# Patient Record
Sex: Male | Born: 1950
Health system: Southern US, Community
[De-identification: ages and names within clinical notes are randomized; demographics above are authoritative.]

## PROBLEM LIST (undated history)

## (undated) DIAGNOSIS — K219 Gastro-esophageal reflux disease without esophagitis: Secondary | ICD-10-CM

## (undated) DIAGNOSIS — C801 Malignant (primary) neoplasm, unspecified: Secondary | ICD-10-CM

## (undated) DIAGNOSIS — I639 Cerebral infarction, unspecified: Secondary | ICD-10-CM

## (undated) DIAGNOSIS — E785 Hyperlipidemia, unspecified: Secondary | ICD-10-CM

## (undated) HISTORY — DX: Cerebral infarction, unspecified: I63.9

## (undated) HISTORY — DX: Hyperlipidemia, unspecified: E78.5

## (undated) HISTORY — DX: Gastro-esophageal reflux disease without esophagitis: K21.9

## (undated) HISTORY — DX: Malignant (primary) neoplasm, unspecified: C80.1

## (undated) HISTORY — PX: EYE SURGERY: SHX253

---

## 2002-07-04 ENCOUNTER — Emergency Department (HOSPITAL_COMMUNITY): Admission: EM | Admit: 2002-07-04 | Discharge: 2002-07-04 | Payer: Self-pay | Admitting: Emergency Medicine

## 2014-12-24 HISTORY — PX: CATARACT EXTRACTION: SUR2

## 2015-11-14 ENCOUNTER — Encounter: Payer: Self-pay | Admitting: Physician Assistant

## 2015-11-14 ENCOUNTER — Ambulatory Visit (INDEPENDENT_AMBULATORY_CARE_PROVIDER_SITE_OTHER): Payer: BLUE CROSS/BLUE SHIELD | Admitting: Physician Assistant

## 2015-11-14 VITALS — BP 110/68 | HR 64 | Temp 98.3°F | Resp 18 | Ht 69.5 in | Wt 176.0 lb

## 2015-11-14 DIAGNOSIS — E785 Hyperlipidemia, unspecified: Secondary | ICD-10-CM | POA: Diagnosis not present

## 2015-11-14 DIAGNOSIS — K219 Gastro-esophageal reflux disease without esophagitis: Secondary | ICD-10-CM | POA: Insufficient documentation

## 2015-11-14 LAB — HEPATIC FUNCTION PANEL
ALBUMIN: 3.9 g/dL (ref 3.6–5.1)
ALT: 15 U/L (ref 9–46)
AST: 13 U/L (ref 10–35)
Alkaline Phosphatase: 56 U/L (ref 40–115)
BILIRUBIN DIRECT: 0.1 mg/dL (ref <=0.2)
Indirect Bilirubin: 0.4 mg/dL (ref 0.2–1.2)
TOTAL PROTEIN: 6.5 g/dL (ref 6.1–8.1)
Total Bilirubin: 0.5 mg/dL (ref 0.2–1.2)

## 2015-11-14 LAB — LIPID PANEL
CHOL/HDL RATIO: 3 ratio (ref <=5.0)
CHOLESTEROL: 151 mg/dL (ref 125–200)
HDL: 51 mg/dL (ref >=40)
LDL Cholesterol: 91 mg/dL (ref <130)
TRIGLYCERIDES: 47 mg/dL (ref <150)
VLDL: 9 mg/dL (ref <30)

## 2015-11-14 MED ORDER — OMEPRAZOLE 40 MG PO CPDR: 40.0000 mg | DELAYED_RELEASE_CAPSULE | Freq: Every day | ORAL | Status: DC

## 2015-11-14 MED ORDER — PRAVASTATIN SODIUM 40 MG PO TABS: 40.0000 mg | ORAL_TABLET | Freq: Every day | ORAL | Status: DC

## 2015-11-14 NOTE — Progress Notes (Signed)
Patient ID: Kyle Garcia MRN: [id number], DOB: 1951-10-19, 64 y.o. Date of Encounter: '@DATE'$ @  Chief Complaint:  Chief Complaint  Patient presents with  . new pt get estab    does not need CPE, med refills    HPI: 64 y.o. year old white male  presents as a new patient to establish care.  He was going to the [facility] but it recently closed so he is establishing care here. He had a complete physical exam there in August 2016. Says that he has also had another "physical" last month-- when changing insurance policies.  He is here today just to establish care and have lab work and get refills on medications.  He states that he is okay with continuing pravastatin if it is necessary but he is wanting to get my opinion about whether he really needs that or not. States that he started that medicine 2 - 3 years ago.  We reviewed his risk factor profile. The only risk factors are age and male.  He quit smoking 30 years ago. He has no hypertension. No Obesity.  He has no family history of cardiovascular disease.  He states that he has not been on the omeprazole for very long. However he does take it most every day. I did discuss today that he can decrease this and take it only as needed. He says that he likes to eat a lot of spicy food and eats a lot of [place] and [place]. Discussed that he could just take it on the days that he knows he is going to eat those foods and try to go without the medicine on other days.  He has no other complaints or concerns today.   History reviewed. No pertinent past medical history.   Home Meds: Aspirin 81 mg daily Omeprazole 40 mg daily Pravastatin 40 mg daily Multivitamin daily    Allergies: No Known Allergies  Social History   Social History  . Marital Status: Married    Spouse Name: N/A  . Number of Children: N/A  . Years of Education: N/A   Occupational History  . Not on file.   Social History Main Topics  . Smoking status: Current  Some Day Smoker    Types: Cigars  . Smokeless tobacco: Never Used  . Alcohol Use: 0.0 oz/week    0 Standard drinks or equivalent per week  . Drug Use: Yes  . Sexual Activity: Not on file   Other Topics Concern  . Not on file   Social History Narrative  . No narrative on file    History reviewed. No pertinent family history.   Review of Systems:  See HPI for pertinent ROS. All other ROS negative.    Physical Exam: Blood pressure 110/68, pulse 64, temperature 98.3 F (36.8 C), temperature source Oral, resp. rate 18, height 5' 9.5" (1.765 m), weight 176 lb (79.833 kg)., Body mass index is 25.63 kg/(m^2). General: WNWD WM. Appears in no acute distress. Neck: Supple. No thyromegaly. No lymphadenopathy. No carotid bruit. Lungs: Clear bilaterally to auscultation without wheezes, rales, or rhonchi. Breathing is unlabored. Heart: RRR with S1 S2. No murmurs, rubs, or gallops. Abdomen: Soft, non-tender, non-distended with normoactive bowel sounds. No hepatomegaly. No rebound/guarding. No obvious abdominal masses. Musculoskeletal:  Strength and tone normal for age. Extremities/Skin: Warm and dry.  No edema.  Neuro: Alert and oriented X 3. Moves all extremities spontaneously. Gait is normal. CNII-XII grossly in tact. Psych:  Responds to questions appropriately with a  normal affect.     ASSESSMENT AND PLAN:  64 y.o. year old male with  1. Gastroesophageal reflux disease, esophagitis presence not specified See history of present illness. Discussed trying to decrease the use of this. Otherwise this is working well and symptoms are controlled with this. May be able to even decrease the use of this. - omeprazole (PRILOSEC) 40 MG capsule; Take 1 capsule (40 mg total) by mouth daily.  Dispense: 90 capsule; Refill: 1  2. Hyperlipemia See history of present illness regarding cardiac risk factor profile and whether this medication can possibly be decreased or even stopped. - Lipid panel -  Hepatic function panel - pravastatin (PRAVACHOL) 40 MG tablet; Take 1 tablet (40 mg total) by mouth daily.  Dispense: 90 tablet; Refill: 1  He did receive influenza vaccine 10/12/15. He will return for regular office visit in 6 months or for complete physical exam once it has been the full 12 month period between his last physical.  Signed, [address] [place], [place], [facility] 11/14/2015 9:34 AM

## 2015-11-15 ENCOUNTER — Encounter: Payer: Self-pay | Admitting: Family Medicine

## 2015-11-21 ENCOUNTER — Telehealth: Payer: Self-pay | Admitting: Family Medicine

## 2015-11-21 ENCOUNTER — Encounter: Payer: Self-pay | Admitting: Family Medicine

## 2015-11-21 NOTE — Telephone Encounter (Signed)
Pt aware of lab results and med change.  Had just picked up 90 day supply of Pravastatin 40, will cut in half until used.  Does not need new Rx called in at this time.

## 2015-11-21 NOTE — Telephone Encounter (Signed)
-----   Message from Orlena Sheldon, PA-C sent at 11/15/2015  2:22 PM EST ----- Tell him he can decrease Pravastatin to '20mg'$ . But, should not stop it all together. Change med list and order new dose.

## 2016-01-27 ENCOUNTER — Other Ambulatory Visit: Payer: Self-pay | Admitting: Family Medicine

## 2016-01-27 DIAGNOSIS — K219 Gastro-esophageal reflux disease without esophagitis: Secondary | ICD-10-CM

## 2016-01-27 MED ORDER — PRAVASTATIN SODIUM 20 MG PO TABS: 20.0000 mg | ORAL_TABLET | Freq: Every day | ORAL | Status: DC

## 2016-01-27 MED ORDER — OMEPRAZOLE 40 MG PO CPDR: 40.0000 mg | DELAYED_RELEASE_CAPSULE | Freq: Every day | ORAL | Status: DC

## 2016-01-27 NOTE — Telephone Encounter (Signed)
Medication refilled per protocol. 

## 2016-01-30 ENCOUNTER — Other Ambulatory Visit: Payer: Self-pay | Admitting: Family Medicine

## 2016-01-30 DIAGNOSIS — K219 Gastro-esophageal reflux disease without esophagitis: Secondary | ICD-10-CM

## 2016-01-30 MED ORDER — OMEPRAZOLE 40 MG PO CPDR: 40.0000 mg | DELAYED_RELEASE_CAPSULE | Freq: Every day | ORAL | Status: DC

## 2016-01-30 MED ORDER — PRAVASTATIN SODIUM 20 MG PO TABS: 20.0000 mg | ORAL_TABLET | Freq: Every day | ORAL | Status: DC

## 2016-01-30 NOTE — Telephone Encounter (Signed)
Medication refilled per protocol. 

## 2016-09-08 ENCOUNTER — Other Ambulatory Visit: Payer: Self-pay | Admitting: Physician Assistant

## 2016-09-08 DIAGNOSIS — K219 Gastro-esophageal reflux disease without esophagitis: Secondary | ICD-10-CM

## 2016-11-19 ENCOUNTER — Encounter: Payer: Self-pay | Admitting: Physician Assistant

## 2016-11-19 ENCOUNTER — Ambulatory Visit (INDEPENDENT_AMBULATORY_CARE_PROVIDER_SITE_OTHER): Payer: BLUE CROSS/BLUE SHIELD | Admitting: Physician Assistant

## 2016-11-19 VITALS — BP 120/78 | HR 70 | Temp 97.9°F | Resp 18 | Wt 179.0 lb

## 2016-11-19 DIAGNOSIS — Z0000 Encounter for general adult medical examination without abnormal findings: Secondary | ICD-10-CM

## 2016-11-19 DIAGNOSIS — K219 Gastro-esophageal reflux disease without esophagitis: Secondary | ICD-10-CM

## 2016-11-19 DIAGNOSIS — E785 Hyperlipidemia, unspecified: Secondary | ICD-10-CM

## 2016-11-19 DIAGNOSIS — Z23 Encounter for immunization: Secondary | ICD-10-CM

## 2016-11-19 LAB — CBC WITH DIFFERENTIAL/PLATELET
BASOS ABS: 0 {cells}/uL (ref 0–200)
BASOS PCT: 0 %
EOS PCT: 2 %
Eosinophils Absolute: 122 cells/uL (ref 15–500)
HCT: 46.9 % (ref 38.5–50.0)
Hemoglobin: 15.6 g/dL (ref 13.0–17.0)
Lymphocytes Relative: 23 %
Lymphs Abs: 1403 cells/uL (ref 850–3900)
MCH: 29.9 pg (ref 27.0–33.0)
MCHC: 33.3 g/dL (ref 32.0–36.0)
MCV: 90 fL (ref 80.0–100.0)
MONOS PCT: 8 %
MPV: 10.5 fL (ref 7.5–12.5)
Monocytes Absolute: 488 cells/uL (ref 200–950)
NEUTROS ABS: 4087 {cells}/uL (ref 1500–7800)
Neutrophils Relative %: 67 %
PLATELETS: 184 10*3/uL (ref 140–400)
RBC: 5.21 MIL/uL (ref 4.20–5.80)
RDW: 13.3 % (ref 11.0–15.0)
WBC: 6.1 10*3/uL (ref 3.8–10.8)

## 2016-11-19 LAB — COMPLETE METABOLIC PANEL WITH GFR
ALT: 24 U/L (ref 9–46)
AST: 27 U/L (ref 10–35)
Albumin: 4.1 g/dL (ref 3.6–5.1)
Alkaline Phosphatase: 50 U/L (ref 40–115)
BILIRUBIN TOTAL: 0.7 mg/dL (ref 0.2–1.2)
BUN: 16 mg/dL (ref 7–25)
CO2: 25 mmol/L (ref 20–31)
Calcium: 8.8 mg/dL (ref 8.6–10.3)
Chloride: 103 mmol/L (ref 98–110)
Creat: 0.95 mg/dL (ref 0.70–1.25)
GFR, EST NON AFRICAN AMERICAN: 84 mL/min (ref >=60)
GFR, Est African American: >89 mL/min (ref >=60)
GLUCOSE: 85 mg/dL (ref 70–99)
Potassium: 4.5 mmol/L (ref 3.5–5.3)
SODIUM: 138 mmol/L (ref 135–146)
TOTAL PROTEIN: 6.7 g/dL (ref 6.1–8.1)

## 2016-11-19 LAB — LIPID PANEL
CHOL/HDL RATIO: 3.4 ratio (ref <5.0)
Cholesterol: 178 mg/dL (ref <200)
HDL: 52 mg/dL (ref >40)
LDL CALC: 115 mg/dL — AB (ref <100)
Triglycerides: 55 mg/dL (ref <150)
VLDL: 11 mg/dL (ref <30)

## 2016-11-19 NOTE — Addendum Note (Signed)
Addended by: Vonna Kotyk A on: 11/19/2016 09:30 AM   Modules accepted: Orders

## 2016-11-19 NOTE — Progress Notes (Signed)
Patient ID: Kyle Garcia MRN: [id number], DOB: November 30, 1951 65 y.o. Date of Encounter: 11/19/2016, 8:50 AM    Chief Complaint: Physical (CPE)  HPI: 65 y.o. y/o male here for CPE.    11/14/2015: 65 y.o. year old white male  presents as a new patient to establish care.  He was going to the [facility] but it recently closed so he is establishing care here. He had a complete physical exam there in August 2016. Says that he has also had another "physical" last month-- when changing insurance policies.  He is here today just to establish care and have lab work and get refills on medications.  He states that he is okay with continuing pravastatin if it is necessary but he is wanting to get my opinion about whether he really needs that or not. States that he started that medicine 2 - 3 years ago.  We reviewed his risk factor profile. The only risk factors are age and male.  He quit smoking 30 years ago. He has no hypertension. No Obesity.  He has no family history of cardiovascular disease.  He states that he has not been on the omeprazole for very long. However he does take it most every day. I did discuss today that he can decrease this and take it only as needed. He says that he likes to eat a lot of spicy food and eats a lot of [place] and [place]. Discussed that he could just take it on the days that he knows he is going to eat those foods and try to go without the medicine on other days.  He has no other complaints or concerns today.  AT THAT OV: --Rxed Omeprazole to use PRN GERD --Checked FLP/CMET--- Told him that he could decrease Pravachol down to 20 mg but did not feel that he could should discontinue it completely.  11/19/2016: Today he states that he did decrease the pravastatin to 20 mg as directed. Is taking this daily. No myalgias or other adverse effects. Today he states that he had been taking the omeprazole every day because he thought that was the way he is  supposed to take it. Today discussed that he can use this just as needed. He has no complaints or concerns today. Say he has been feeling well.  Review of Systems: Consitutional: No fever, chills, fatigue, night sweats, lymphadenopathy, or weight changes. Eyes: No visual changes, eye redness, or discharge. ENT/Mouth: Ears: No otalgia, tinnitus, hearing loss, discharge. Nose: No congestion, rhinorrhea, sinus pain, or epistaxis. Throat: No sore throat, post nasal drip, or teeth pain. Cardiovascular: No CP, palpitations, diaphoresis, DOE, edema, orthopnea, PND. Respiratory: No cough, hemoptysis, SOB, or wheezing. Gastrointestinal: No anorexia, dysphagia, reflux, pain, nausea, vomiting, hematemesis, diarrhea, constipation, BRBPR, or melena. Genitourinary: No dysuria, frequency, urgency, hematuria, incontinence, nocturia, decreased urinary stream, discharge, impotence, or testicular pain/masses. Musculoskeletal: No decreased ROM, myalgias, stiffness, joint swelling, or weakness. Skin: No rash, erythema, lesion changes, pain, warmth, jaundice, or pruritis. Neurological: No headache, dizziness, syncope, seizures, tremors, memory loss, coordination problems, or paresthesias. Psychological: No anxiety, depression, hallucinations, SI/HI. Endocrine: No fatigue, polydipsia, polyphagia, polyuria, or known diabetes. All other systems were reviewed and are otherwise negative.  Past Medical History:  Diagnosis Date  . GERD (gastroesophageal reflux disease)   . Hyperlipidemia      No past surgical history on file.  Home Meds:  Outpatient Medications Prior to Visit  Medication Sig Dispense Refill  . aspirin EC 81 MG tablet Take 81  mg by mouth every other day.    . Multiple Vitamins-Minerals (MULTIVITAMIN WITH MINERALS) tablet Take 1 tablet by mouth.    Marland Kitchen omeprazole (PRILOSEC) 40 MG capsule TAKE 1 CAPSULE DAILY 90 capsule 1  . pravastatin (PRAVACHOL) 20 MG tablet Take 1 tablet (20 mg total) by mouth  daily at 6 PM. 90 tablet 1   No facility-administered medications prior to visit.     Allergies: No Known Allergies  Social History   Social History  . Marital status: Married    Spouse name: N/A  . Number of children: N/A  . Years of education: N/A   Occupational History  . Not on file.   Social History Main Topics  . Smoking status: Current Some Day Smoker    Types: Cigars  . Smokeless tobacco: Never Used  . Alcohol use 2.4 - 4.2 oz/week    1 - 3 Glasses of wine, 3 - 4 Cans of beer per week  . Drug use:   . Sexual activity: Not on file   Other Topics Concern  . Not on file   Social History Narrative  . No narrative on file    No family history on file.  Physical Exam: Blood pressure 120/78, pulse 70, temperature 97.9 F (36.6 C), temperature source Oral, resp. rate 18, weight 179 lb (81.2 kg), SpO2 99 %.  General: Well developed, well nourished WM. Appears in no acute distress. HEENT: Normocephalic, atraumatic. Conjunctiva pink, sclera non-icteric. Pupils 2 mm constricting to 1 mm, round, regular, and equally reactive to light and accomodation. EOMI. Internal auditory canal clear. TMs with good cone of light and without pathology. Nasal mucosa pink. Nares are without discharge. No sinus tenderness. Oral mucosa pink. Dentition good. Pharynx without exudate.   Neck: Supple. Trachea midline. No thyromegaly. Full ROM. No lymphadenopathy. Lungs: Clear to auscultation bilaterally without wheezes, rales, or rhonchi. Breathing is of normal effort and unlabored. Cardiovascular: RRR with S1 S2. No murmurs, rubs, or gallops. Distal pulses 2+ symmetrically. No carotid or abdominal bruits. Abdomen: Soft, non-tender, non-distended with normoactive bowel sounds. No hepatosplenomegaly or masses. No rebound/guarding. No CVA tenderness. No hernias. Rectal: No external hemorrhoids or fissures. Rectal vault without masses. Prostate gland firm and smooth. No nodularity, tenderness, mass, or  induration.  Musculoskeletal: Full range of motion and 5/5 strength throughout.  Skin: Warm and moist without erythema, ecchymosis, wounds, or rash. Neuro: A+Ox3. CN II-XII grossly intact. Moves all extremities spontaneously. Full sensation throughout. Normal gait. DTR 2+ throughout upper and lower extremities.  Psych:  Responds to questions appropriately with a normal affect.   Assessment/Plan:  65 y.o. y/o white male here for CPE  -1. Encounter for preventive health examination  A. Screening Labs: - CBC with Differential/Platelet - COMPLETE METABOLIC PANEL WITH GFR - Lipid panel - TSH - VITAMIN D 25 Hydroxy (Vit-D Deficiency, Fractures) - PSA  B. Screening For Prostate Cancer: - PSA  C. Screening For Colorectal Cancer:  11/19/16--- he reports he has had 2 colonoscopies. Says that the last one was last year and was normal and was told that he can repeat 10 years.  D. Immunizations: Flu------------ he has already received this for the 2017 flu season Tetanus-----he is agreeable to update this today. Given here 11/19/16 Pneumococcal--- he has no indication to require this until age 27 Zostavax---------- he states that he has received this.   2. Gastroesophageal reflux disease, esophagitis presence not specified 11/19/2016: Discussed with him that he can use the omeprazole PRN  3.  Hyperlipidemia, unspecified hyperlipidemia type 11/19/2016: He did decrease pravastatin to 20 mg after the lab 10/2015. We'll recheck lab now. - COMPLETE METABOLIC PANEL WITH GFR - Lipid panel  Routine office visit and fasting labs 6 months. Follow-up sooner if needed.  Signed:   [address] [place], [place]  11/19/2016 8:50 AM

## 2016-11-20 LAB — PSA: PSA: 1.1 ng/mL (ref <=4.0)

## 2016-11-20 LAB — TSH: TSH: 1.07 m[IU]/L (ref 0.40–4.50)

## 2016-11-20 LAB — VITAMIN D 25 HYDROXY (VIT D DEFICIENCY, FRACTURES): Vit D, 25-Hydroxy: 43 ng/mL (ref 30–100)

## 2016-11-26 ENCOUNTER — Encounter: Payer: Self-pay | Admitting: Family Medicine

## 2016-11-26 ENCOUNTER — Ambulatory Visit (INDEPENDENT_AMBULATORY_CARE_PROVIDER_SITE_OTHER): Payer: BLUE CROSS/BLUE SHIELD | Admitting: Family Medicine

## 2016-11-26 VITALS — BP 122/62 | HR 88 | Temp 98.6°F | Resp 14 | Ht 70.0 in | Wt 180.0 lb

## 2016-11-26 DIAGNOSIS — J069 Acute upper respiratory infection, unspecified: Secondary | ICD-10-CM

## 2016-11-26 MED ORDER — HYDROCOD POLST-CPM POLST ER 10-8 MG/5ML PO SUER: 5.0000 mL | Freq: Two times a day (BID) | ORAL | 0 refills | Status: DC | PRN

## 2016-11-26 NOTE — Patient Instructions (Addendum)
Mucinex DM  Tussionex at bedtime  Nasal saline  F/U as needed

## 2016-11-26 NOTE — Progress Notes (Signed)
   Subjective:    Patient ID: Kyle Garcia, male    DOB: Dec 17, 1951, 65 y.o.   MRN: [id number]  Patient presents for Illness (x1 week- nonprodictive cough, cough worsens at night, mild HA )  Patient here with nonproductive cough worse at night mild headache but no significant sinus pressure or drainage this been going on for the past 5 days. He did receive his tetanus shot on the 27. Positive sick contact with his grandchild. He's been taking Alka-Seltzer but this is not helping the cough.  No fever   Review Of Systems:  GEN- denies fatigue, fever, weight loss,weakness, recent illness HEENT- denies eye drainage, change in vision, nasal discharge, CVS- denies chest pain, palpitations RESP- denies SOB, +cough, wheeze ABD- denies N/V, change in stools, abd pain GU- denies dysuria, hematuria, dribbling, incontinence MSK- denies joint pain, muscle aches, injury Neuro- denies headache, dizziness, syncope, seizure activity       Objective:    BP 122/62 (BP Location: Left Arm, Patient Position: Sitting, Cuff Size: Large)   Pulse 88   Temp 98.6 F (37 C) (Oral)   Resp 14   Ht '5\' 10"'$  (1.778 m)   Wt 180 lb (81.6 kg)   SpO2 99%   BMI 25.83 kg/m  GEN- NAD, alert and oriented x3 HEENT- PERRL, EOMI, non injected sclera, pink conjunctiva, MMM, oropharynx clear , TM clear bilat no effusion, no  maxillary sinus tenderness, =clear  Nasal drainage  Neck- Supple, no LAD CVS- RRR, no murmur RESP-CTAB Pulses- Radial 2+          Assessment & Plan:      Problem List Items Addressed This Visit    None    Visit Diagnoses    Acute URI    -  Primary   Viral URI, given tussionex, use mucinex DM during day, nasal saline, call if not improved       Note: This dictation was prepared with Dragon dictation along with smaller phrase technology. Any transcriptional errors that result from this process are unintentional.

## 2016-12-03 ENCOUNTER — Telehealth: Payer: Self-pay | Admitting: Physician Assistant

## 2016-12-03 NOTE — Telephone Encounter (Signed)
Spoke with [facility] rep Lilia Pro B  and she stated there is no form to fill out we can continue to send rx's  as usual just need to send through [employer] which I have added to pt pharmacy list . Pt made aware he is req that come [facility]24[facility]2016 [facility] and change to [place]

## 2016-12-03 NOTE — Telephone Encounter (Signed)
Patient called in states he is going to have [employer] part D come 12/24/2016. He is requesting we contact [employer] at [phone] and request a form that we will need to fill out and fax back to them so he can continue to get his medications through the mail. The fax # we will need to send the request back to is [phone] his [facility] ID# [id number]  CB# [phone] you may leave msg once this has been taken care of.

## 2016-12-28 ENCOUNTER — Ambulatory Visit (INDEPENDENT_AMBULATORY_CARE_PROVIDER_SITE_OTHER): Payer: Medicare Other | Admitting: Family Medicine

## 2016-12-28 ENCOUNTER — Encounter: Payer: Self-pay | Admitting: Family Medicine

## 2016-12-28 VITALS — BP 132/72 | HR 78 | Temp 98.2°F | Resp 14 | Ht 70.0 in | Wt 173.0 lb

## 2016-12-28 DIAGNOSIS — K529 Noninfective gastroenteritis and colitis, unspecified: Secondary | ICD-10-CM | POA: Diagnosis not present

## 2016-12-28 LAB — CBC WITH DIFFERENTIAL/PLATELET
BASOS ABS: 71 {cells}/uL (ref 0–200)
Basophils Relative: 1 %
EOS PCT: 1 %
Eosinophils Absolute: 71 cells/uL (ref 15–500)
HEMATOCRIT: 43.4 % (ref 38.5–50.0)
HEMOGLOBIN: 14.3 g/dL (ref 13.0–17.0)
LYMPHS ABS: 1065 {cells}/uL (ref 850–3900)
Lymphocytes Relative: 15 %
MCH: 29.5 pg (ref 27.0–33.0)
MCHC: 32.9 g/dL (ref 32.0–36.0)
MCV: 89.7 fL (ref 80.0–100.0)
MPV: 9.2 fL (ref 7.5–12.5)
Monocytes Absolute: 639 cells/uL (ref 200–950)
Monocytes Relative: 9 %
NEUTROS PCT: 74 %
Neutro Abs: 5254 cells/uL (ref 1500–7800)
Platelets: 284 10*3/uL (ref 140–400)
RBC: 4.84 MIL/uL (ref 4.20–5.80)
RDW: 12.9 % (ref 11.0–15.0)
WBC: 7.1 10*3/uL (ref 3.8–10.8)

## 2016-12-28 MED ORDER — DIPHENOXYLATE-ATROPINE 2.5-0.025 MG PO TABS: 2.0000 | ORAL_TABLET | Freq: Four times a day (QID) | ORAL | 0 refills | Status: DC | PRN

## 2016-12-28 NOTE — Progress Notes (Signed)
Subjective:    Patient ID: Kyle Garcia, male    DOB: [birth year], 66 y.o.   MRN: [id number]  HPI  Patient is very pleasant 66 year old white male with no significant past medical history who became initially ill around Christmas day. He describes flulike symptoms including chills and a fever and severe diarrhea. Denies hematochezia. Fevers persisted off and on for several days but for the most part has resolved. Intestinal cramps have improved. However diarrhea persist. He has lost 10 pounds. He denies any blood in his stool.  He does report syptoms of malabsorption including weakness and fatigue. He denies any travel outside the country. He denies caffeine or drinking creek water. He denies taking any antibiotics. He has not visited anyone in the hospital or nursing home Past Medical History:  Diagnosis Date  . GERD (gastroesophageal reflux disease)   . Hyperlipidemia    History reviewed. No pertinent surgical history. Current Outpatient Prescriptions on File Prior to Visit  Medication Sig Dispense Refill  . aspirin EC 81 MG tablet Take 81 mg by mouth every other day.    . Multiple Vitamins-Minerals (MULTIVITAMIN WITH MINERALS) tablet Take 1 tablet by mouth.    Marland Kitchen omeprazole (PRILOSEC) 40 MG capsule TAKE 1 CAPSULE DAILY (Patient taking differently: TAKE 1 CAPSULE DAILY PRN) 90 capsule 1  . pravastatin (PRAVACHOL) 20 MG tablet Take 1 tablet (20 mg total) by mouth daily at 6 PM. 90 tablet 1  . chlorpheniramine-HYDROcodone (TUSSIONEX PENNKINETIC ER) 10-8 MG/5ML SUER Take 5 mLs by mouth every 12 (twelve) hours as needed for cough. (Patient not taking: Reported on 12/28/2016) 140 mL 0   No current facility-administered medications on file prior to visit.    No Known Allergies Social History   Social History  . Marital status: Married    Spouse name: N/A  . Number of children: N/A  . Years of education: N/A   Occupational History  . Not on file.   Social History Main Topics  . Smoking  status: Current Some Day Smoker    Types: Cigars  . Smokeless tobacco: Never Used  . Alcohol use 2.4 - 4.2 oz/week    1 - 3 Glasses of wine, 3 - 4 Cans of beer per week  . Drug use:   . Sexual activity: Not on file   Other Topics Concern  . Not on file   Social History Narrative  . No narrative on file     Review of Systems  All other systems reviewed and are negative.      Objective:   Physical Exam  Cardiovascular: Normal rate, regular rhythm and normal heart sounds.   Pulmonary/Chest: Effort normal and breath sounds normal. No respiratory distress. He has no wheezes. He has no rales.  Abdominal: Soft. Bowel sounds are normal. He exhibits no distension. There is no tenderness. There is no rebound and no guarding.  Vitals reviewed.         Assessment & Plan:  Chronic diarrhea - Plan: Gastrointestinal Pathogen Panel PCR, CBC with Differential/Platelet, COMPLETE METABOLIC PANEL WITH GFR, Sedimentation rate  I suspect viral gastritis most likely from Noravirus. I believe the infection has resolved but now believe the patient has malabsorptive diarrhea as a result. I want him to start restora 1 tablet a day to replace the normal bacterial flora.  Use Lomotil 2 tablets every 6 hours as needed. Check CBC, CMP, sedimentation rate. Check GI pathogen panel to rule out infection such as C. difficile. Recheck next week. If  studies are negative and symptoms do not improve, consider colonoscopy

## 2016-12-29 LAB — COMPLETE METABOLIC PANEL WITH GFR
ALBUMIN: 3.5 g/dL — AB (ref 3.6–5.1)
ALK PHOS: 54 U/L (ref 40–115)
ALT: 23 U/L (ref 9–46)
AST: 16 U/L (ref 10–35)
BUN: 13 mg/dL (ref 7–25)
CALCIUM: 8.6 mg/dL (ref 8.6–10.3)
CO2: 26 mmol/L (ref 20–31)
Chloride: 102 mmol/L (ref 98–110)
Creat: 0.8 mg/dL (ref 0.70–1.25)
GFR, Est African American: >89 mL/min (ref >=60)
Glucose, Bld: 109 mg/dL — ABNORMAL HIGH (ref 70–99)
POTASSIUM: 4.5 mmol/L (ref 3.5–5.3)
SODIUM: 140 mmol/L (ref 135–146)
Total Bilirubin: 0.5 mg/dL (ref 0.2–1.2)
Total Protein: 6 g/dL — ABNORMAL LOW (ref 6.1–8.1)

## 2016-12-29 LAB — SEDIMENTATION RATE: SED RATE: 53 mm/h — AB (ref 0–20)

## 2017-01-01 LAB — GASTROINTESTINAL PATHOGEN PANEL PCR
C. DIFFICILE TOX A/B, PCR: NOT DETECTED
CRYPTOSPORIDIUM, PCR: NOT DETECTED
Campylobacter, PCR: DETECTED — CR
E COLI (ETEC) LT/ST, PCR: NOT DETECTED
E COLI (STEC) STX1/STX2, PCR: NOT DETECTED
E coli 0157, PCR: NOT DETECTED
GIARDIA LAMBLIA, PCR: NOT DETECTED
Norovirus, PCR: NOT DETECTED
Rotavirus A, PCR: NOT DETECTED
Salmonella, PCR: NOT DETECTED
Shigella, PCR: NOT DETECTED

## 2017-02-04 ENCOUNTER — Telehealth: Payer: Self-pay | Admitting: Physician Assistant

## 2017-02-04 MED ORDER — PRAVASTATIN SODIUM 20 MG PO TABS: 20.0000 mg | ORAL_TABLET | Freq: Every day | ORAL | 1 refills | Status: DC

## 2017-02-04 NOTE — Telephone Encounter (Signed)
Pravastatin sent to [facility] delivery

## 2017-02-04 NOTE — Telephone Encounter (Signed)
Patient has new pharmacy and needs his pravastatin to go to there please  [phone] mail order through [facility]  Fax number is [phone]

## 2017-02-07 ENCOUNTER — Telehealth: Payer: Self-pay | Admitting: Physician Assistant

## 2017-02-07 MED ORDER — PRAVASTATIN SODIUM 20 MG PO TABS: 20.0000 mg | ORAL_TABLET | Freq: Every day | ORAL | 1 refills | Status: DC

## 2017-02-07 NOTE — Telephone Encounter (Signed)
Pt is having issues getting his pravastatin filled. Please call him back on [phone]

## 2017-02-07 NOTE — Telephone Encounter (Signed)
Spoke with Denyse Amass at [facility] regarding the pravastatin '20mg'$  and she stated pt does not hve mail order with pharmacy.   Denyse Amass stated she is not able to automatically sign the pt up he would have to do a 90 supply through his reg pharmacy .  Called pt LVMTCB

## 2017-02-07 NOTE — Telephone Encounter (Signed)
Spoke with pt he req that  His Rx for pravastatin be sent to his local pharmacy until he can get mail order set up with [employer].   Rx filled

## 2017-02-07 NOTE — Telephone Encounter (Signed)
Spoke with pt  he states [facility] was unable to fill his Pravastatin 20 mg   With no reason why. Explained to pt I would call [employer] to find out why they were unable to fill the Rx.

## 2017-02-21 ENCOUNTER — Other Ambulatory Visit: Payer: Self-pay

## 2017-02-21 DIAGNOSIS — K219 Gastro-esophageal reflux disease without esophagitis: Secondary | ICD-10-CM

## 2017-02-21 MED ORDER — OMEPRAZOLE 40 MG PO CPDR: 40.0000 mg | DELAYED_RELEASE_CAPSULE | Freq: Every day | ORAL | 0 refills | Status: DC

## 2017-02-21 NOTE — Telephone Encounter (Signed)
Rx filled per protocol  

## 2017-05-20 ENCOUNTER — Other Ambulatory Visit: Payer: Self-pay | Admitting: Physician Assistant

## 2017-05-20 DIAGNOSIS — K219 Gastro-esophageal reflux disease without esophagitis: Secondary | ICD-10-CM

## 2017-05-21 NOTE — Telephone Encounter (Signed)
Refill appropriate 

## 2017-05-23 ENCOUNTER — Encounter: Payer: Self-pay | Admitting: Physician Assistant

## 2017-05-23 ENCOUNTER — Ambulatory Visit (INDEPENDENT_AMBULATORY_CARE_PROVIDER_SITE_OTHER): Payer: Medicare Other | Admitting: Physician Assistant

## 2017-05-23 VITALS — BP 122/86 | HR 69 | Temp 98.0°F | Resp 14 | Wt 171.0 lb

## 2017-05-23 DIAGNOSIS — E785 Hyperlipidemia, unspecified: Secondary | ICD-10-CM

## 2017-05-23 DIAGNOSIS — K219 Gastro-esophageal reflux disease without esophagitis: Secondary | ICD-10-CM | POA: Diagnosis not present

## 2017-05-23 LAB — HEPATIC FUNCTION PANEL
ALBUMIN: 3.8 g/dL (ref 3.6–5.1)
ALT: 12 U/L (ref 9–46)
AST: 13 U/L (ref 10–35)
Alkaline Phosphatase: 57 U/L (ref 40–115)
BILIRUBIN DIRECT: 0.1 mg/dL (ref <=0.2)
BILIRUBIN TOTAL: 0.6 mg/dL (ref 0.2–1.2)
Indirect Bilirubin: 0.5 mg/dL (ref 0.2–1.2)
Total Protein: 6.5 g/dL (ref 6.1–8.1)

## 2017-05-23 LAB — LIPID PANEL
CHOL/HDL RATIO: 3.5 ratio (ref <5.0)
Cholesterol: 168 mg/dL (ref <200)
HDL: 48 mg/dL (ref >40)
LDL CALC: 103 mg/dL — AB (ref <100)
Triglycerides: 83 mg/dL (ref <150)
VLDL: 17 mg/dL (ref <30)

## 2017-05-23 NOTE — Progress Notes (Signed)
Patient ID: Kyle Garcia MRN: [id number], DOB: 09-28-1951 66 y.o. Date of Encounter: 05/23/2017, 8:16 AM    Chief Complaint: Physical (CPE)  HPI: 66 y.o. y/o male here for CPE.    11/14/2015: 66 y.o. year old white male  presents as a new patient to establish care.  He was going to the [facility] but it recently closed so he is establishing care here. He had a complete physical exam there in August 2016. Says that he has also had another "physical" last month-- when changing insurance policies.  He is here today just to establish care and have lab work and get refills on medications.  He states that he is okay with continuing pravastatin if it is necessary but he is wanting to get my opinion about whether he really needs that or not. States that he started that medicine 2 - 3 years ago.  We reviewed his risk factor profile. The only risk factors are age and male.  He quit smoking 30 years ago. He has no hypertension. No Obesity.  He has no family history of cardiovascular disease.  He states that he has not been on the omeprazole for very long. However he does take it most every day. I did discuss today that he can decrease this and take it only as needed. He says that he likes to eat a lot of spicy food and eats a lot of [place] and [place]. Discussed that he could just take it on the days that he knows he is going to eat those foods and try to go without the medicine on other days.  He has no other complaints or concerns today.  AT THAT OV: --Rxed Omeprazole to use PRN GERD --Checked FLP/CMET--- Told him that he could decrease Pravachol down to 20 mg but did not feel that he could should discontinue it completely.   11/19/2016: Today he states that he did decrease the pravastatin to 20 mg as directed. Is taking this daily. No myalgias or other adverse effects. Today he states that he had been taking the omeprazole every day because he thought that was the way he is  supposed to take it. Today discussed that he can use this just as needed. He has no complaints or concerns today. Say he has been feeling well.   05/23/2017: Patient states that he has been feeling well. He is taking the pravastatin 20 mg. No myalgias or other adverse effects. He reports that he very rarely needs to take the omeprazole and only uses this as needed for heartburn symptoms. Here to recheck labs to monitor his cholesterol. He is fasting. No other concerns to address today.    Review of Systems: Consitutional: No fever, chills, fatigue, night sweats, lymphadenopathy, or weight changes. Eyes: No visual changes, eye redness, or discharge. ENT/Mouth: Ears: No otalgia, tinnitus, hearing loss, discharge. Nose: No congestion, rhinorrhea, sinus pain, or epistaxis. Throat: No sore throat, post nasal drip, or teeth pain. Cardiovascular: No CP, palpitations, diaphoresis, DOE, edema, orthopnea, PND. Respiratory: No cough, hemoptysis, SOB, or wheezing. Gastrointestinal: No anorexia, dysphagia, reflux, pain, nausea, vomiting, hematemesis, diarrhea, constipation, BRBPR, or melena. Genitourinary: No dysuria, frequency, urgency, hematuria, incontinence, nocturia, decreased urinary stream, discharge, impotence, or testicular pain/masses. Musculoskeletal: No decreased ROM, myalgias, stiffness, joint swelling, or weakness. Skin: No rash, erythema, lesion changes, pain, warmth, jaundice, or pruritis. Neurological: No headache, dizziness, syncope, seizures, tremors, memory loss, coordination problems, or paresthesias. Psychological: No anxiety, depression, hallucinations, SI/HI. Endocrine: No fatigue, polydipsia, polyphagia,  polyuria, or known diabetes. All other systems were reviewed and are otherwise negative.  Past Medical History:  Diagnosis Date  . GERD (gastroesophageal reflux disease)   . Hyperlipidemia      No past surgical history on file.  Home Meds:  Outpatient Medications Prior to  Visit  Medication Sig Dispense Refill  . aspirin EC 81 MG tablet Take 81 mg by mouth every other day.    . Multiple Vitamins-Minerals (MULTIVITAMIN WITH MINERALS) tablet Take 1 tablet by mouth.    Marland Kitchen omeprazole (PRILOSEC) 40 MG capsule TAKE 1 CAPSULE (40 MG TOTAL) BY MOUTH DAILY. 90 capsule 0  . pravastatin (PRAVACHOL) 20 MG tablet Take 1 tablet (20 mg total) by mouth daily at 6 PM. 90 tablet 1  . chlorpheniramine-HYDROcodone (TUSSIONEX PENNKINETIC ER) 10-8 MG/5ML SUER Take 5 mLs by mouth every 12 (twelve) hours as needed for cough. (Patient not taking: Reported on 12/28/2016) 140 mL 0  . diphenoxylate-atropine (LOMOTIL) 2.5-0.025 MG tablet Take 2 tablets by mouth 4 (four) times daily as needed for diarrhea or loose stools. 30 tablet 0   No facility-administered medications prior to visit.     Allergies: No Known Allergies  Social History   Social History  . Marital status: Married    Spouse name: N/A  . Number of children: N/A  . Years of education: N/A   Occupational History  . Not on file.   Social History Main Topics  . Smoking status: Current Some Day Smoker    Types: Cigars  . Smokeless tobacco: Never Used  . Alcohol use 2.4 - 4.2 oz/week    1 - 3 Glasses of wine, 3 - 4 Cans of beer per week  . Drug use: Yes  . Sexual activity: Not on file   Other Topics Concern  . Not on file   Social History Narrative  . No narrative on file    No family history on file.  Physical Exam: Blood pressure 122/86, pulse 69, temperature 98 F (36.7 C), temperature source Oral, resp. rate 14, weight 171 lb (77.6 kg), SpO2 99 %.  General: Well developed, well nourished WM. Appears in no acute distress. Neck: Supple. Trachea midline. No thyromegaly. Full ROM. No lymphadenopathy. Lungs: Clear to auscultation bilaterally without wheezes, rales, or rhonchi. Breathing is of normal effort and unlabored. Cardiovascular: RRR with S1 S2. No murmurs, rubs, or gallops. Distal pulses 2+  symmetrically. No carotid or abdominal bruits. Abdomen: Soft, non-tender, non-distended with normoactive bowel sounds. No hepatosplenomegaly or masses. No rebound/guarding. No CVA tenderness. No hernias. Musculoskeletal: Full range of motion and 5/5 strength throughout.  Skin: Warm and moist without erythema, ecchymosis, wounds, or rash. Neuro: A+Ox3. CN II-XII grossly intact. Moves all extremities spontaneously. Full sensation throughout. Normal gait. DTR 2+ throughout upper and lower extremities.  Psych:  Responds to questions appropriately with a normal affect.   Assessment/Plan:  66 y.o. y/o white male here for   Gastroesophageal reflux disease, esophagitis presence not specified 11/19/2016: Discussed with him that he can use the omeprazole PRN 05/23/2017: He very rarely needs omeprazole. Only uses it prn  Hyperlipidemia, unspecified hyperlipidemia type 11/19/2016: He did decrease pravastatin to 20 mg after the lab 10/2015.  05/23/2017: He is still taking pravastatin 20 mg daily. He is fasting. Recheck labs to monitor. Only cardiac risk factors are male and age. - COMPLETE METABOLIC PANEL WITH GFR - Lipid panel  Routine office visit and fasting labs 6 months. Follow-up sooner if needed.     --------------------------THE  FOLLOWING IS COPIED FROM HIS CPE 10/2016------NOT ADDRESSED AT OV 05/23/2017--------------------------------   -1. Encounter for preventive health examination  A. Screening Labs: - CBC with Differential/Platelet - COMPLETE METABOLIC PANEL WITH GFR - Lipid panel - TSH - VITAMIN D 25 Hydroxy (Vit-D Deficiency, Fractures) - PSA  B. Screening For Prostate Cancer: - PSA  C. Screening For Colorectal Cancer:  11/19/16--- he reports he has had 2 colonoscopies. Says that the last one was last year and was normal and was told that he can repeat 10 years.  D. Immunizations: Flu------------ he has already received this for the 2017 flu season Tetanus-----he is  agreeable to update this today. Given here 11/19/16 Pneumococcal--- he has no indication to require this until age 38 Zostavax---------- he states that he has received this.   Gastroesophageal reflux disease, esophagitis presence not specified 11/19/2016: Discussed with him that he can use the omeprazole PRN  Hyperlipidemia, unspecified hyperlipidemia type 11/19/2016: He did decrease pravastatin to 20 mg after the lab 10/2015. We'll recheck lab now. - COMPLETE METABOLIC PANEL WITH GFR - Lipid panel  Routine office visit and fasting labs 6 months. Follow-up sooner if needed.  Signed:   [address] [place], [place]  05/23/2017 8:16 AM

## 2017-08-02 ENCOUNTER — Other Ambulatory Visit: Payer: Self-pay | Admitting: Physician Assistant

## 2017-08-02 NOTE — Telephone Encounter (Signed)
Refill appropriate 

## 2017-08-18 ENCOUNTER — Other Ambulatory Visit: Payer: Self-pay | Admitting: Physician Assistant

## 2017-08-18 DIAGNOSIS — K219 Gastro-esophageal reflux disease without esophagitis: Secondary | ICD-10-CM

## 2017-08-19 NOTE — Telephone Encounter (Signed)
Refill appropriate 

## 2017-09-27 DIAGNOSIS — H04123 Dry eye syndrome of bilateral lacrimal glands: Secondary | ICD-10-CM | POA: Diagnosis not present

## 2017-09-27 DIAGNOSIS — H26492 Other secondary cataract, left eye: Secondary | ICD-10-CM | POA: Diagnosis not present

## 2017-09-27 DIAGNOSIS — H353131 Nonexudative age-related macular degeneration, bilateral, early dry stage: Secondary | ICD-10-CM | POA: Diagnosis not present

## 2017-10-02 ENCOUNTER — Ambulatory Visit (INDEPENDENT_AMBULATORY_CARE_PROVIDER_SITE_OTHER): Payer: Medicare Other

## 2017-10-02 DIAGNOSIS — Z23 Encounter for immunization: Secondary | ICD-10-CM

## 2017-10-02 NOTE — Progress Notes (Signed)
Patient was seen in office for a flu vaccine. Patient received injection in right deltoid patient tolerated well.

## 2017-11-16 ENCOUNTER — Other Ambulatory Visit: Payer: Self-pay | Admitting: Physician Assistant

## 2017-11-16 DIAGNOSIS — K219 Gastro-esophageal reflux disease without esophagitis: Secondary | ICD-10-CM

## 2017-11-18 NOTE — Telephone Encounter (Signed)
Medication refilled per protocol. 

## 2017-11-25 ENCOUNTER — Encounter: Payer: Self-pay | Admitting: Physician Assistant

## 2017-11-25 ENCOUNTER — Ambulatory Visit (INDEPENDENT_AMBULATORY_CARE_PROVIDER_SITE_OTHER): Payer: Medicare Other | Admitting: Physician Assistant

## 2017-11-25 VITALS — BP 130/82 | HR 76 | Temp 97.8°F | Resp 14 | Ht 69.5 in | Wt 175.8 lb

## 2017-11-25 DIAGNOSIS — Z1159 Encounter for screening for other viral diseases: Secondary | ICD-10-CM

## 2017-11-25 DIAGNOSIS — Z23 Encounter for immunization: Secondary | ICD-10-CM

## 2017-11-25 DIAGNOSIS — K219 Gastro-esophageal reflux disease without esophagitis: Secondary | ICD-10-CM | POA: Diagnosis not present

## 2017-11-25 DIAGNOSIS — Z0000 Encounter for general adult medical examination without abnormal findings: Secondary | ICD-10-CM | POA: Diagnosis not present

## 2017-11-25 DIAGNOSIS — E785 Hyperlipidemia, unspecified: Secondary | ICD-10-CM | POA: Diagnosis not present

## 2017-11-25 DIAGNOSIS — Z114 Encounter for screening for human immunodeficiency virus [HIV]: Secondary | ICD-10-CM

## 2017-11-25 DIAGNOSIS — Z125 Encounter for screening for malignant neoplasm of prostate: Secondary | ICD-10-CM | POA: Diagnosis not present

## 2017-11-25 MED ORDER — OMEPRAZOLE 40 MG PO CPDR: 40.0000 mg | DELAYED_RELEASE_CAPSULE | Freq: Every day | ORAL | 3 refills | Status: DC

## 2017-11-25 MED ORDER — PRAVASTATIN SODIUM 20 MG PO TABS: ORAL_TABLET | ORAL | 1 refills | Status: DC

## 2017-11-25 NOTE — Progress Notes (Signed)
Patient ID: Kyle Garcia MRN: [id number], DOB: 1951-10-30 66 y.o. Date of Encounter: 11/25/2017, 9:01 AM    Chief Complaint: Physical (CPE)  HPI: 66 y.o. y/o male here for CPE.    11/14/2015: 66 y.o. year old white male  presents as a new patient to establish care.  He was going to the [facility] but it recently closed so he is establishing care here. He had a complete physical exam there in August 2016. Says that he has also had another "physical" last month-- when changing insurance policies.  He is here today just to establish care and have lab work and get refills on medications.  He states that he is okay with continuing pravastatin if it is necessary but he is wanting to get my opinion about whether he really needs that or not. States that he started that medicine 2 - 3 years ago.  We reviewed his risk factor profile. The only risk factors are age and male.  He quit smoking 30 years ago. He has no hypertension. No Obesity.  He has no family history of cardiovascular disease.  He states that he has not been on the omeprazole for very long. However he does take it most every day. I did discuss today that he can decrease this and take it only as needed. He says that he likes to eat a lot of spicy food and eats a lot of [place] and [place]. Discussed that he could just take it on the days that he knows he is going to eat those foods and try to go without the medicine on other days.  He has no other complaints or concerns today.  AT THAT OV: --Rxed Omeprazole to use PRN GERD --Checked FLP/CMET--- Told him that he could decrease Pravachol down to 20 mg but did not feel that he could should discontinue it completely.  11/19/2016: Today he states that he did decrease the pravastatin to 20 mg as directed. Is taking this daily. No myalgias or other adverse effects. Today he states that he had been taking the omeprazole every day because he thought that was the way he is  supposed to take it. Today discussed that he can use this just as needed. He has no complaints or concerns today. Say he has been feeling well.  05/23/2017: He states that he has been feeling well.  He is taking the pravastatin 20 mg.  No myalgias or other adverse effects.  He reports that he very rarely needs to take the omeprazole and only uses this as needed for heartburn symptoms.  Here to recheck labs to monitor his cholesterol.  He is fasting.  No other concerns to address today.  11/25/2017: He reports that again he has continued to be feeling well.  10 used to take the pravastatin 20 mg.  Continues to have no myalgias or other adverse effects.  Again today reports that he is only using the omeprazole as needed for heartburn symptoms.  He presents today for complete physical exam.  He is fasting so he can check labs today.  States that he has been feeling good and had no medical problems arise over the past year.  Has no specific concerns to address today.    Review of Systems: Consitutional: No fever, chills, fatigue, night sweats, lymphadenopathy, or weight changes. Eyes: No visual changes, eye redness, or discharge. ENT/Mouth: Ears: No otalgia, tinnitus, hearing loss, discharge. Nose: No congestion, rhinorrhea, sinus pain, or epistaxis. Throat: No sore throat, post  nasal drip, or teeth pain. Cardiovascular: No CP, palpitations, diaphoresis, DOE, edema, orthopnea, PND. Respiratory: No cough, hemoptysis, SOB, or wheezing. Gastrointestinal: No anorexia, dysphagia, pain, nausea, vomiting, hematemesis, diarrhea, constipation, BRBPR, or melena. Genitourinary: No dysuria, frequency, urgency, hematuria, incontinence, nocturia, decreased urinary stream, discharge, impotence, or testicular pain/masses. Musculoskeletal: No decreased ROM, myalgias, stiffness, joint swelling, or weakness. Skin: No rash, erythema, lesion changes, pain, warmth, jaundice, or pruritis. Neurological: No headache,  dizziness, syncope, seizures, tremors, memory loss, coordination problems, or paresthesias. Psychological: No anxiety, depression, hallucinations, SI/HI. Endocrine: No fatigue, polydipsia, polyphagia, polyuria, or known diabetes. All other systems were reviewed and are otherwise negative.  Past Medical History:  Diagnosis Date  . GERD (gastroesophageal reflux disease)   . Hyperlipidemia      History reviewed. No pertinent surgical history.  Home Meds:  Outpatient Medications Prior to Visit  Medication Sig Dispense Refill  . aspirin EC 81 MG tablet Take 81 mg by mouth every other day.    . Multiple Vitamins-Minerals (MULTIVITAMIN WITH MINERALS) tablet Take 1 tablet by mouth.    Marland Kitchen omeprazole (PRILOSEC) 40 MG capsule TAKE 1 CAPSULE (40 MG TOTAL) BY MOUTH DAILY. 90 capsule 3  . pravastatin (PRAVACHOL) 20 MG tablet TAKE 1 TABLET DAILY AT 6 PM 90 tablet 1   No facility-administered medications prior to visit.     Allergies: No Known Allergies  Social History   Socioeconomic History  . Marital status: Married    Spouse name: Not on file  . Number of children: Not on file  . Years of education: Not on file  . Highest education level: Not on file  Social Needs  . Financial resource strain: Not on file  . Food insecurity - worry: Not on file  . Food insecurity - inability: Not on file  . Transportation needs - medical: Not on file  . Transportation needs - non-medical: Not on file  Occupational History  . Not on file  Tobacco Use  . Smoking status: Current Some Day Smoker    Types: Cigars  . Smokeless tobacco: Never Used  Substance and Sexual Activity  . Alcohol use: Yes    Alcohol/week: 2.4 - 4.2 oz    Types: 1 - 3 Glasses of wine, 3 - 4 Cans of beer per week  . Drug use: Yes  . Sexual activity: Not on file  Other Topics Concern  . Not on file  Social History Narrative  . Not on file    History reviewed. No pertinent family history.  Physical Exam: Blood pressure  130/82, pulse 76, temperature 97.8 F (36.6 C), temperature source Oral, resp. rate 14, height 5' 9.5" (1.765 m), weight 79.7 kg (175 lb 12.8 oz), SpO2 98 %.  General: Well developed, well nourished WM. Appears in no acute distress. HEENT: Normocephalic, atraumatic. Conjunctiva pink, sclera non-icteric. Pupils 2 mm constricting to 1 mm, round, regular, and equally reactive to light and accomodation. EOMI. Internal auditory canal clear. TMs with good cone of light and without pathology. Nasal mucosa pink. Nares are without discharge. No sinus tenderness. Oral mucosa pink. Dentition good. Pharynx without exudate.   Neck: Supple. Trachea midline. No thyromegaly. Full ROM. No lymphadenopathy. Lungs: Clear to auscultation bilaterally without wheezes, rales, or rhonchi. Breathing is of normal effort and unlabored. Cardiovascular: RRR with S1 S2. No murmurs, rubs, or gallops. Distal pulses 2+ symmetrically. No carotid or abdominal bruits. Abdomen: Soft, non-tender, non-distended with normoactive bowel sounds. No hepatosplenomegaly or masses. No rebound/guarding. No CVA tenderness. No hernias.  Musculoskeletal: Full range of motion and 5/5 strength throughout.  Skin: Warm and moist without erythema, ecchymosis, wounds, or rash. Neuro: A+Ox3. CN II-XII grossly intact. Moves all extremities spontaneously. Full sensation throughout. Normal gait. Psych:  Responds to questions appropriately with a normal affect.   Assessment/Plan:  66 y.o. y/o white male here for CPE  -1. Encounter for preventive health examination 2. Medicare annual wellness visit, subsequent A. Screening Labs: 11/25/2017: He is fasting.  Check labs now. - CBC with Differential/Platelet - COMPLETE METABOLIC PANEL WITH GFR - Lipid panel - PSA  B. Screening For Prostate Cancer: - PSA  C. Screening For Colorectal Cancer:  11/25/2017---He reports he has had 2 colonoscopies. Says that the last one was 2016 and was normal and was told that  he can repeat 10 years. Next Colonoscopy due 2026.  D. Immunizations: Flu------------ he has already received this for the 2018 flu season Tetanus-------Given here 11/19/16 Pneumococcal--- He is now age 19----Give Prevnar 2 today--11/25/2017 Zostavax---------- he states that he has received this.  Need for hepatitis C screening test - Hepatitis C antibody  Screening for HIV (human immunodeficiency virus) - HIV antibody  Gastroesophageal reflux disease, esophagitis presence not specified 11/25/2017: Cont to use the omeprazole PRN  Hyperlipidemia, unspecified hyperlipidemia type 11/25/2017: He did decrease pravastatin to 20 mg after the lab 10/2015. Will recheck lab now. - COMPLETE METABOLIC PANEL WITH GFR - Lipid panel  Subjective:   Patient presents for Medicare Annual/Subsequent preventive examination.   Review Past Medical/Family/Social: All of this information has been reviewed today.   Risk Factors  Current exercise habits: He stays very active and likes to do things outside now that he is retired. Dietary issues discussed: Eats a very healthy diet.  Low-cholesterol low carbohydrate low sodium diet.  Cardiac risk factors: Male, Age  Depression Screen  (Note: if answer to either of the following is "Yes", a more complete depression screening is indicated)  Over the past two weeks, have you felt down, depressed or hopeless? No Over the past two weeks, have you felt little interest or pleasure in doing things? No Have you lost interest or pleasure in daily life? No Do you often feel hopeless? No Do you cry easily over simple problems? No   Activities of Daily Living  In your present state of health, do you have any difficulty performing the following activities?:  Driving? No  Managing money? No  Feeding yourself? No  Getting from bed to chair? No  Climbing a flight of stairs? No  Preparing food and eating?: No  Bathing or showering? No  Getting dressed: No  Getting  to the toilet? No  Using the toilet:No  Moving around from place to place: No  In the past year have you fallen or had a near fall?:No  Are you sexually active? No  Do you have more than one partner? No   Hearing Difficulties: No  Do you often ask people to speak up or repeat themselves? No  Do you experience ringing or noises in your ears? No Do you have difficulty understanding soft or whispered voices? No  Do you feel that you have a problem with memory? No Do you often misplace items? No  Do you feel safe at home? Yes  Cognitive Testing  Alert? Yes Normal Appearance?Yes  Oriented to person? Yes Place? Yes  Time? Yes  Recall of three objects? Yes  Can perform simple calculations? Yes  Displays appropriate judgment?Yes  Can read the correct time from  a watch face?Yes   List the Names of Other Physician/Practitioners you currently use:  He has seen no other practitioners over the past year.  Indicate any recent Medical Services you may have received from other than [facility] providers in the past year (date may be approximate).  Has received no other medical services over the past year.  Screening Tests / Date----all of this information is documented above.  See note above. Colonoscopy                     Zostavax  Mammogram  Influenza Vaccine  Tetanus/tdap    Assessment:    Annual wellness medicare exam   Plan:    During the course of the visit the patient was educated and counseled about appropriate screening and preventive services including:  Screening mammography  Colorectal cancer screening  Shingles vaccine. Prescription given to that she can get the vaccine at the pharmacy or Medicare part D.  Screen + for depression. PHQ- 9 score of 12 (moderate depression). We discussed the options of counseling versus possibly a medication. I encouraged her strongly think about the counseling. She is going through some medical problems currently and her husband is as well Mrs.  been very stressful for her. She says she will think about it. She does have Xanax to use as needed. Though she may benefit from an SSRI for her more depressive type symptoms but she wants to hold off at this time.  I aksed her to please have her cardioloist send records since we have none on file.  Diet review for nutrition referral? Yes ____ Not Indicated __x__  Patient Instructions (the written plan) was given to the patient.  Medicare Attestation  I have personally reviewed:  The patient's medical and social history  Their use of alcohol, tobacco or illicit drugs  Their current medications and supplements  The patient's functional ability including ADLs,fall risks, home safety risks, cognitive, and hearing and visual impairment  Diet and physical activities  Evidence for depression or mood disorders  The patient's weight, height, BMI, and visual acuity have been recorded in the chart. I have made referrals, counseling, and provided education to the patient based on review of the above and I have provided the patient with a written personalized care plan for preventive services.       Routine office visit and fasting labs 6 months. Follow-up sooner if needed.  Signed:   [address] [place], [place]  11/25/2017 9:01 AM

## 2017-11-25 NOTE — Addendum Note (Signed)
Addended by: Vonna Kotyk A on: 11/25/2017 05:18 PM   Modules accepted: Orders

## 2017-11-26 LAB — LIPID PANEL
CHOL/HDL RATIO: 3.9 (calc) (ref <5.0)
CHOLESTEROL: 188 mg/dL (ref <200)
HDL: 48 mg/dL (ref >40)
LDL CHOLESTEROL (CALC): 118 mg/dL — AB
Non-HDL Cholesterol (Calc): 140 mg/dL (calc) — ABNORMAL HIGH (ref <130)
Triglycerides: 109 mg/dL (ref <150)

## 2017-11-26 LAB — PSA: PSA: 1.1 ng/mL (ref <=4.0)

## 2017-11-26 LAB — CBC WITH DIFFERENTIAL/PLATELET
Basophils Absolute: 53 cells/uL (ref 0–200)
Basophils Relative: 0.9 %
Eosinophils Absolute: 71 cells/uL (ref 15–500)
Eosinophils Relative: 1.2 %
HCT: 47.4 % (ref 38.5–50.0)
Hemoglobin: 16 g/dL (ref 13.2–17.1)
Lymphs Abs: 1215 cells/uL (ref 850–3900)
MCH: 29.8 pg (ref 27.0–33.0)
MCHC: 33.8 g/dL (ref 32.0–36.0)
MCV: 88.3 fL (ref 80.0–100.0)
MPV: 11.3 fL (ref 7.5–12.5)
Monocytes Relative: 9.4 %
Neutro Abs: 4006 cells/uL (ref 1500–7800)
Neutrophils Relative %: 67.9 %
Platelets: 170 10*3/uL (ref 140–400)
RBC: 5.37 10*6/uL (ref 4.20–5.80)
RDW: 12.4 % (ref 11.0–15.0)
Total Lymphocyte: 20.6 %
WBC mixed population: 555 cells/uL (ref 200–950)
WBC: 5.9 10*3/uL (ref 3.8–10.8)

## 2017-11-26 LAB — COMPLETE METABOLIC PANEL WITH GFR
AG RATIO: 1.7 (calc) (ref 1.0–2.5)
ALBUMIN MSPROF: 4.2 g/dL (ref 3.6–5.1)
ALKALINE PHOSPHATASE (APISO): 60 U/L (ref 40–115)
ALT: 20 U/L (ref 9–46)
AST: 16 U/L (ref 10–35)
BILIRUBIN TOTAL: 0.6 mg/dL (ref 0.2–1.2)
BUN: 19 mg/dL (ref 7–25)
CHLORIDE: 106 mmol/L (ref 98–110)
CO2: 26 mmol/L (ref 20–32)
Calcium: 9 mg/dL (ref 8.6–10.3)
Creat: 0.86 mg/dL (ref 0.70–1.25)
GFR, EST AFRICAN AMERICAN: 105 mL/min/{1.73_m2} (ref >=60)
GFR, Est Non African American: 91 mL/min/{1.73_m2} (ref >=60)
GLOBULIN: 2.5 g/dL (ref 1.9–3.7)
GLUCOSE: 97 mg/dL (ref 65–99)
Potassium: 4.7 mmol/L (ref 3.5–5.3)
SODIUM: 141 mmol/L (ref 135–146)
TOTAL PROTEIN: 6.7 g/dL (ref 6.1–8.1)

## 2017-11-26 LAB — HEPATITIS C ANTIBODY
HEP C AB: NONREACTIVE
SIGNAL TO CUT-OFF: 0.02 (ref <1.00)

## 2017-11-26 LAB — HIV ANTIBODY (ROUTINE TESTING W REFLEX): HIV 1&2 Ab, 4th Generation: NONREACTIVE

## 2017-12-31 DIAGNOSIS — H353131 Nonexudative age-related macular degeneration, bilateral, early dry stage: Secondary | ICD-10-CM | POA: Diagnosis not present

## 2017-12-31 DIAGNOSIS — H26493 Other secondary cataract, bilateral: Secondary | ICD-10-CM | POA: Diagnosis not present

## 2017-12-31 DIAGNOSIS — H04123 Dry eye syndrome of bilateral lacrimal glands: Secondary | ICD-10-CM | POA: Diagnosis not present

## 2017-12-31 DIAGNOSIS — H20011 Primary iridocyclitis, right eye: Secondary | ICD-10-CM | POA: Diagnosis not present

## 2017-12-31 DIAGNOSIS — H524 Presbyopia: Secondary | ICD-10-CM | POA: Diagnosis not present

## 2017-12-31 DIAGNOSIS — H5211 Myopia, right eye: Secondary | ICD-10-CM | POA: Diagnosis not present

## 2017-12-31 DIAGNOSIS — H52222 Regular astigmatism, left eye: Secondary | ICD-10-CM | POA: Diagnosis not present

## 2018-01-01 DIAGNOSIS — H5211 Myopia, right eye: Secondary | ICD-10-CM | POA: Diagnosis not present

## 2018-01-01 DIAGNOSIS — H04123 Dry eye syndrome of bilateral lacrimal glands: Secondary | ICD-10-CM | POA: Diagnosis not present

## 2018-01-01 DIAGNOSIS — H52222 Regular astigmatism, left eye: Secondary | ICD-10-CM | POA: Diagnosis not present

## 2018-01-01 DIAGNOSIS — H353131 Nonexudative age-related macular degeneration, bilateral, early dry stage: Secondary | ICD-10-CM | POA: Diagnosis not present

## 2018-01-01 DIAGNOSIS — H524 Presbyopia: Secondary | ICD-10-CM | POA: Diagnosis not present

## 2018-01-01 DIAGNOSIS — H26493 Other secondary cataract, bilateral: Secondary | ICD-10-CM | POA: Diagnosis not present

## 2018-01-01 DIAGNOSIS — H20011 Primary iridocyclitis, right eye: Secondary | ICD-10-CM | POA: Diagnosis not present

## 2018-01-02 DIAGNOSIS — H20011 Primary iridocyclitis, right eye: Secondary | ICD-10-CM | POA: Diagnosis not present

## 2018-01-06 DIAGNOSIS — H5211 Myopia, right eye: Secondary | ICD-10-CM | POA: Diagnosis not present

## 2018-01-06 DIAGNOSIS — H524 Presbyopia: Secondary | ICD-10-CM | POA: Diagnosis not present

## 2018-01-06 DIAGNOSIS — H04123 Dry eye syndrome of bilateral lacrimal glands: Secondary | ICD-10-CM | POA: Diagnosis not present

## 2018-01-06 DIAGNOSIS — H26493 Other secondary cataract, bilateral: Secondary | ICD-10-CM | POA: Diagnosis not present

## 2018-01-06 DIAGNOSIS — H353131 Nonexudative age-related macular degeneration, bilateral, early dry stage: Secondary | ICD-10-CM | POA: Diagnosis not present

## 2018-01-06 DIAGNOSIS — H20011 Primary iridocyclitis, right eye: Secondary | ICD-10-CM | POA: Diagnosis not present

## 2018-01-06 DIAGNOSIS — H52222 Regular astigmatism, left eye: Secondary | ICD-10-CM | POA: Diagnosis not present

## 2018-01-14 DIAGNOSIS — H04123 Dry eye syndrome of bilateral lacrimal glands: Secondary | ICD-10-CM | POA: Diagnosis not present

## 2018-01-14 DIAGNOSIS — H20011 Primary iridocyclitis, right eye: Secondary | ICD-10-CM | POA: Diagnosis not present

## 2018-01-14 DIAGNOSIS — H353131 Nonexudative age-related macular degeneration, bilateral, early dry stage: Secondary | ICD-10-CM | POA: Diagnosis not present

## 2018-01-14 DIAGNOSIS — H26493 Other secondary cataract, bilateral: Secondary | ICD-10-CM | POA: Diagnosis not present

## 2018-01-21 DIAGNOSIS — H5211 Myopia, right eye: Secondary | ICD-10-CM | POA: Diagnosis not present

## 2018-01-21 DIAGNOSIS — H04123 Dry eye syndrome of bilateral lacrimal glands: Secondary | ICD-10-CM | POA: Diagnosis not present

## 2018-01-21 DIAGNOSIS — H353131 Nonexudative age-related macular degeneration, bilateral, early dry stage: Secondary | ICD-10-CM | POA: Diagnosis not present

## 2018-01-21 DIAGNOSIS — H20011 Primary iridocyclitis, right eye: Secondary | ICD-10-CM | POA: Diagnosis not present

## 2018-01-21 DIAGNOSIS — H26493 Other secondary cataract, bilateral: Secondary | ICD-10-CM | POA: Diagnosis not present

## 2018-01-21 DIAGNOSIS — H524 Presbyopia: Secondary | ICD-10-CM | POA: Diagnosis not present

## 2018-01-21 DIAGNOSIS — H52222 Regular astigmatism, left eye: Secondary | ICD-10-CM | POA: Diagnosis not present

## 2018-05-26 ENCOUNTER — Ambulatory Visit: Payer: Medicare Other | Admitting: Physician Assistant

## 2018-06-02 ENCOUNTER — Other Ambulatory Visit: Payer: Self-pay

## 2018-06-02 ENCOUNTER — Encounter: Payer: Self-pay | Admitting: Physician Assistant

## 2018-06-02 ENCOUNTER — Ambulatory Visit (INDEPENDENT_AMBULATORY_CARE_PROVIDER_SITE_OTHER): Payer: Medicare Other | Admitting: Physician Assistant

## 2018-06-02 VITALS — BP 118/70 | HR 65 | Temp 98.0°F | Resp 14 | Ht 70.0 in | Wt 171.8 lb

## 2018-06-02 DIAGNOSIS — E785 Hyperlipidemia, unspecified: Secondary | ICD-10-CM | POA: Diagnosis not present

## 2018-06-02 DIAGNOSIS — K219 Gastro-esophageal reflux disease without esophagitis: Secondary | ICD-10-CM | POA: Diagnosis not present

## 2018-06-02 LAB — HEPATIC FUNCTION PANEL
AG RATIO: 1.8 (calc) (ref 1.0–2.5)
ALKALINE PHOSPHATASE (APISO): 62 U/L (ref 40–115)
ALT: 13 U/L (ref 9–46)
AST: 14 U/L (ref 10–35)
Albumin: 4.5 g/dL (ref 3.6–5.1)
BILIRUBIN TOTAL: 0.5 mg/dL (ref 0.2–1.2)
Bilirubin, Direct: 0.1 mg/dL (ref 0.0–0.2)
GLOBULIN: 2.5 g/dL (ref 1.9–3.7)
Indirect Bilirubin: 0.4 mg/dL (calc) (ref 0.2–1.2)
TOTAL PROTEIN: 7 g/dL (ref 6.1–8.1)

## 2018-06-02 LAB — LIPID PANEL
CHOLESTEROL: 176 mg/dL (ref <200)
HDL: 48 mg/dL (ref >40)
LDL CHOLESTEROL (CALC): 105 mg/dL — AB
Non-HDL Cholesterol (Calc): 128 mg/dL (calc) (ref <130)
Total CHOL/HDL Ratio: 3.7 (calc) (ref <5.0)
Triglycerides: 124 mg/dL (ref <150)

## 2018-06-02 NOTE — Progress Notes (Signed)
Patient ID: Kyle Garcia MRN: [id number], DOB: [birth year] 67 y.o. Date of Encounter: 06/02/2018, 9:28 AM    Chief Complaint: Physical (CPE)  HPI: 67 y.o. y/o male here for CPE.    11/14/2015: 67 y.o. year old white male  presents as a new patient to establish care.  He was going to the [facility] but it recently closed so he is establishing care here. He had a complete physical exam there in August 2016. Says that he has also had another "physical" last month-- when changing insurance policies.  He is here today just to establish care and have lab work and get refills on medications.  He states that he is okay with continuing pravastatin if it is necessary but he is wanting to get my opinion about whether he really needs that or not. States that he started that medicine 2 - 3 years ago.  We reviewed his risk factor profile. The only risk factors are age and male.  He quit smoking 30 years ago. He has no hypertension. No Obesity.  He has no family history of cardiovascular disease.  He states that he has not been on the omeprazole for very long. However he does take it most every day. I did discuss today that he can decrease this and take it only as needed. He says that he likes to eat a lot of spicy food and eats a lot of [place] and [place]. Discussed that he could just take it on the days that he knows he is going to eat those foods and try to go without the medicine on other days.  He has no other complaints or concerns today.  AT THAT OV: --Rxed Omeprazole to use PRN GERD --Checked FLP/CMET--- Told him that he could decrease Pravachol down to 20 mg but did not feel that he could should discontinue it completely.   11/19/2016: Today he states that he did decrease the pravastatin to 20 mg as directed. Is taking this daily. No myalgias or other adverse effects. Today he states that he had been taking the omeprazole every day because he thought that was the way he is  supposed to take it. Today discussed that he can use this just as needed. He has no complaints or concerns today. Say he has been feeling well.   05/23/2017: Patient states that he has been feeling well. He is taking the pravastatin 20 mg. No myalgias or other adverse effects. He reports that he very rarely needs to take the omeprazole and only uses this as needed for heartburn symptoms. Here to recheck labs to monitor his cholesterol. He is fasting. No other concerns to address today.  11/25/2017:  He presented for CPE.  At that visit we updated preventive care and screening labs.  Also at that visit he was continuing to take the pravastatin.  He had no specific concerns to address at that visit.  06/02/2018:  He continues to take the pravastatin daily.  Having no myalgias or other adverse effects.  Very rarely needs to use the omeprazole.  No other concerns to address today.    Review of Systems: Consitutional: No fever, chills, fatigue, night sweats, lymphadenopathy, or weight changes. Eyes: No visual changes, eye redness, or discharge. ENT/Mouth: Ears: No otalgia, tinnitus, hearing loss, discharge. Nose: No congestion, rhinorrhea, sinus pain, or epistaxis. Throat: No sore throat, post nasal drip, or teeth pain. Cardiovascular: No CP, palpitations, diaphoresis, DOE, edema, orthopnea, PND. Respiratory: No cough, hemoptysis, SOB, or wheezing. Gastrointestinal:  No anorexia, dysphagia, reflux, pain, nausea, vomiting, hematemesis, diarrhea, constipation, BRBPR, or melena. Genitourinary: No dysuria, frequency, urgency, hematuria, incontinence, nocturia, decreased urinary stream, discharge, impotence, or testicular pain/masses. Musculoskeletal: No decreased ROM, myalgias, stiffness, joint swelling, or weakness. Skin: No rash, erythema, lesion changes, pain, warmth, jaundice, or pruritis. Neurological: No headache, dizziness, syncope, seizures, tremors, memory loss, coordination problems, or  paresthesias. Psychological: No anxiety, depression, hallucinations, SI/HI. Endocrine: No fatigue, polydipsia, polyphagia, polyuria, or known diabetes. All other systems were reviewed and are otherwise negative.  Past Medical History:  Diagnosis Date  . GERD (gastroesophageal reflux disease)   . Hyperlipidemia      History reviewed. No pertinent surgical history.  Home Meds:  Outpatient Medications Prior to Visit  Medication Sig Dispense Refill  . aspirin EC 81 MG tablet Take 81 mg by mouth every other day.    . Multiple Vitamins-Minerals (MULTIVITAMIN WITH MINERALS) tablet Take 1 tablet by mouth.    Marland Kitchen omeprazole (PRILOSEC) 40 MG capsule Take 1 capsule (40 mg total) by mouth daily. 90 capsule 3  . pravastatin (PRAVACHOL) 20 MG tablet TAKE 1 TABLET DAILY AT 6 PM 90 tablet 1   No facility-administered medications prior to visit.     Allergies: No Known Allergies  Social History   Socioeconomic History  . Marital status: Married    Spouse name: Not on file  . Number of children: Not on file  . Years of education: Not on file  . Highest education level: Not on file  Occupational History  . Not on file  Social Needs  . Financial resource strain: Not on file  . Food insecurity:    Worry: Not on file    Inability: Not on file  . Transportation needs:    Medical: Not on file    Non-medical: Not on file  Tobacco Use  . Smoking status: Current Some Day Smoker    Types: Cigars  . Smokeless tobacco: Never Used  Substance and Sexual Activity  . Alcohol use: Yes    Alcohol/week: 2.4 - 4.2 oz    Types: 1 - 3 Glasses of wine, 3 - 4 Cans of beer per week  . Drug use: Yes  . Sexual activity: Not on file  Lifestyle  . Physical activity:    Days per week: Not on file    Minutes per session: Not on file  . Stress: Not on file  Relationships  . Social connections:    Talks on phone: Not on file    Gets together: Not on file    Attends religious service: Not on file    Active  member of club or organization: Not on file    Attends meetings of clubs or organizations: Not on file    Relationship status: Not on file  . Intimate partner violence:    Fear of current or ex partner: Not on file    Emotionally abused: Not on file    Physically abused: Not on file    Forced sexual activity: Not on file  Other Topics Concern  . Not on file  Social History Narrative  . Not on file    History reviewed. No pertinent family history.   Physical Exam: Blood pressure 118/70, pulse 65, temperature 98 F (36.7 C), temperature source Oral, resp. rate 14, height 5\' 10"  (1.778 m), weight 77.9 kg (171 lb 12.8 oz), SpO2 98 %., Body mass index is 24.65 kg/m. General: Appears in no acute distress. Neck: Supple. No thyromegaly. No lymphadenopathy.  No carotid bruit. Lungs: Clear bilaterally to auscultation without wheezes, rales, or rhonchi. Breathing is unlabored. Heart: RRR with S1 S2. No murmurs, rubs, or gallops. Abdomen: Soft, non-tender, non-distended with normoactive bowel sounds. No hepatomegaly. No rebound/guarding. No obvious abdominal masses. Musculoskeletal:  Strength and tone normal for age. Extremities/Skin: Warm and dry.  No LE edema.  Neuro: Alert and oriented X 3. Moves all extremities spontaneously. Gait is normal. CNII-XII grossly in tact. Psych:  Responds to questions appropriately with a normal affect.   Assessment/Plan:  67 y.o. y/o white male here for   Gastroesophageal reflux disease, esophagitis presence not specified 11/19/2016: Discussed with him that he can use the omeprazole PRN 05/23/2017: He very rarely needs omeprazole. Only uses it prn 06/02/2018:  He very rarely needs omeprazole. Only uses it prn   Hyperlipidemia, unspecified hyperlipidemia type 11/19/2016: He did decrease pravastatin to 20 mg after the lab 10/2015.  06/02/2018: He is still taking pravastatin 20 mg daily. He is fasting. Recheck labs to monitor. Only cardiac risk factors are  male and age. - COMPLETE METABOLIC PANEL WITH GFR - Lipid panel  CPE and fasting labs 6 months. Follow-up sooner if needed.  For Preventive care see CPE note 11/25/2017. Have been doing CPE once per year and ROV, FLP/LFT  in between.  CPE and fasting labs 6 months. Follow-up sooner if needed.  Signed:   [address] [place], [place]  06/02/2018 9:28 AM

## 2018-07-02 DIAGNOSIS — H04123 Dry eye syndrome of bilateral lacrimal glands: Secondary | ICD-10-CM | POA: Diagnosis not present

## 2018-07-02 DIAGNOSIS — H524 Presbyopia: Secondary | ICD-10-CM | POA: Diagnosis not present

## 2018-07-02 DIAGNOSIS — H353131 Nonexudative age-related macular degeneration, bilateral, early dry stage: Secondary | ICD-10-CM | POA: Diagnosis not present

## 2018-07-02 DIAGNOSIS — H26493 Other secondary cataract, bilateral: Secondary | ICD-10-CM | POA: Diagnosis not present

## 2018-07-02 DIAGNOSIS — H35351 Cystoid macular degeneration, right eye: Secondary | ICD-10-CM | POA: Diagnosis not present

## 2018-07-03 NOTE — Progress Notes (Signed)
[facility] Note  07/04/2018     CHIEF COMPLAINT Patient presents for Retina Evaluation   HISTORY OF PRESENT ILLNESS: Kyle Garcia is a 67 y.o. male who presents to the clinic today for:   HPI    Retina Evaluation    In both eyes.  This started 1 week ago.  Associated Symptoms Floaters.  Negative for Flashes, Blind Spot, Photophobia, Scalp Tenderness, Fever, Pain, Glare, Jaw Claudication, Weight Loss, Distortion, Redness, Trauma, Shoulder/Hip pain and Fatigue.  Context:  distance vision, mid-range vision and near vision.  Treatments tried include surgery.  Response to treatment was significant improvement.  I, the attending physician,  performed the HPI with the patient and updated documentation appropriately.          Comments    Referral of DR. Patel for retina. Patient states after going beach a week ago, he noticed On Sunday (06/29/18) blurred vision OD.Denies flashes and ocular pain.  Pt reports he had cataract sx 2015 OU with significant improvement,since then he has occasional floaters OD.Pt uses Refresh PRN        Last edited by Bernarda Caffey, MD on 07/04/2018  2:14 PM. (History)    Pt states he went to see Dr. Posey Pronto for a film over OD [facility]; Pt states he was told he had fluid in the back of OD; Pt states he was treated for iritis OD previously by Dr. Posey Pronto; Pt states he has noticed trouble reading newspaper; Pt states he has a history of cold sores; Pt denies any immuno issues, denies any rheumatological issues;   Referring physician: Virgina Evener, [address] [place], [place] [address]  HISTORICAL INFORMATION:   Selected notes from the MEDICAL RECORD NUMBER Referred by Dr. Virgina Evener for cystoid macular edema LEE: 07.10.19 Sammie Bench) [[facility]: OD: 20/20-3 OS: 20/20-1] Ocular Hx-panuveitis, iritis, dry eye syndrome, non-exu ARMD OU, PCO OU, pseudophakia OU ([place]) PMH-high cholesterol, HTN    CURRENT MEDICATIONS: Current  Outpatient Medications (Ophthalmic Drugs)  Medication Sig  . ketorolac (ACULAR) 0.5 % ophthalmic solution Place 1 drop into the right eye 4 (four) times daily.  . prednisoLONE acetate (PRED FORTE) 1 % ophthalmic suspension Place 1 drop into the right eye 4 (four) times daily.   No current facility-administered medications for this visit.  (Ophthalmic Drugs)   Current Outpatient Medications (Other)  Medication Sig  . aspirin EC 81 MG tablet Take 81 mg by mouth every other day.  . Multiple Vitamins-Minerals (MULTIVITAMIN WITH MINERALS) tablet Take 1 tablet by mouth.  Marland Kitchen omeprazole (PRILOSEC) 40 MG capsule Take 1 capsule (40 mg total) by mouth daily.  . pravastatin (PRAVACHOL) 20 MG tablet TAKE 1 TABLET DAILY AT 6 PM  . valACYclovir (VALTREX) 500 MG tablet Take 2 tablets (1,000 mg total) by mouth 2 (two) times daily.   No current facility-administered medications for this visit.  (Other)      REVIEW OF SYSTEMS: ROS    Positive for: Eyes   Negative for: Constitutional, Gastrointestinal, Neurological, Skin, Genitourinary, Musculoskeletal, HENT, Endocrine, Cardiovascular, Respiratory, Psychiatric, Allergic/Imm, Heme/Lymph   Last edited by Zenovia Jordan, LPN on 4/48/1856  3:14 PM. (History)       ALLERGIES No Known Allergies  PAST MEDICAL HISTORY Past Medical History:  Diagnosis Date  . GERD (gastroesophageal reflux disease)   . Hyperlipidemia    Past Surgical History:  Procedure Laterality Date  . CATARACT EXTRACTION  2016  . EYE SURGERY  FAMILY HISTORY No family history on file.  SOCIAL HISTORY Social History   Tobacco Use  . Smoking status: Current Some Day Smoker    Types: Cigars  . Smokeless tobacco: Never Used  Substance Use Topics  . Alcohol use: Yes    Alcohol/week: 2.4 - 4.2 oz    Types: 1 - 3 Glasses of wine, 3 - 4 Cans of beer per week  . Drug use: Yes         OPHTHALMIC EXAM:  Base Eye Exam    Visual Acuity (Snellen - Linear)      Right  Left   Dist [place] 20/40 +3 20/30   Dist ph [place] 20/30 20/25       Tonometry (Tonopen, 1:17 PM)      Right Left   Pressure 16 16       Pupils      Dark Light Shape React APD   Right 4 3 Round Brisk None   Left 4 3 Round Brisk None       Visual Fields    Counting fingers       Extraocular Movement      Right Left    Full, Ortho Full, Ortho       Neuro/Psych    Oriented x3:  Yes   Mood/Affect:  Normal       Dilation    Both eyes:  1.0% Mydriacyl, 2.5% Phenylephrine @ 1:16 PM        Slit Lamp and Fundus Exam    Slit Lamp Exam      Right Left   Lids/Lashes Normal Normal   Conjunctiva/Sclera Temporal Pinguecula Temporal Pinguecula   Cornea Arcus, Temporal Well healed cataract wounds Arcus, Temporal Well healed cataract wounds   Anterior Chamber Deep, rare cell Deep and quiet   Iris Round and slightly reactive, Transillumination defects 1100, 0230, 0430, 0600, 0645 - linear and radial  Round and dilated   Lens PC IOL in good position, trace Posterior capsular opacification PC IOL in good position, 1+ Posterior capsular opacification   Vitreous Vitreous syneresis, ? Anterior vitreous cell, Posterior vitreous detachment Vitreous syneresis       Fundus Exam      Right Left   Disc mildly blurred margin temporally Pink and Sharp   C/D Ratio 0.1 0.1   Macula Blunted foveal reflex, Retinal pigment epithelial mottling, +CME, Drusen, Mild Superior Epiretinal membrane Flat, Blunted foveal reflex, Retinal pigment epithelial mottling, Drusen, No heme or edema   Vessels Tortuous Normal   Periphery Attached Attached        Refraction    Manifest Refraction (Retinoscopy)      Sphere Cylinder Axis Dist VA   Right -0.50 +0.50 090 20/30   Left -0.75 +1.00 082 20/25-2          IMAGING AND PROCEDURES  Imaging and Procedures for @TODAY @  OCT, Retina - OU - Both Eyes       Right Eye Quality was good. Central Foveal Thickness: 541. Progression has no prior data. Findings  include intraretinal fluid, cystoid macular edema, subretinal fluid, retinal drusen  (Trace ERM).   Left Eye Quality was good. Central Foveal Thickness: 312. Progression has no prior data. Findings include normal foveal contour, no IRF, no SRF, retinal drusen .   Notes *Images captured and stored on drive  Diagnosis / Impression:  OD: CME w/ mild central pocket of SRF OD: NFP, No IRF/SRF  Clinical management:  See below  Abbreviations: NFP -  Normal foveal profile. CME - cystoid macular edema. PED - pigment epithelial detachment. IRF - intraretinal fluid. SRF - subretinal fluid. EZ - ellipsoid zone. ERM - epiretinal membrane. ORA - outer retinal atrophy. ORT - outer retinal tubulation. SRHM - subretinal hyper-reflective material         Fluorescein Angiography Optos (Transit OD)       Right Eye Progression has no prior data. Early phase findings include leakage. Mid/Late phase findings include leakage.   Left Eye Progression has no prior data. Early phase findings include microaneurysm. Mid/Late phase findings include microaneurysm.   Notes *Images captured and stored on drive;   Impression: OD: central petaloid hyperfluorescence consistent with CME noted on OCT; hyperfluorescence of the disc OS: focal punctate areas of hyperfluorescence consistent with microaneurysms                  ASSESSMENT/PLAN:    ICD-10-CM   1. CME (cystoid macular edema), right H35.351 Fluorescein Angiography Optos (Transit OD)  2. Retinal edema H35.81 OCT, Retina - OU - Both Eyes    Fluorescein Angiography Optos (Transit OD)  3. Iridocyclitis of right eye H20.9   4. Intermediate stage nonexudative age-related macular degeneration of both eyes H35.3132   5. Pseudophakia of both eyes Z96.1     1,2. CME OD-  - unclear etiology; relatively healthy individual with no history of auto-immune or rheumatologic disease - differential includes delayed post op CME / Kathleen Argue, infectious,  auto-immune, idiopathic, or other - reviewed incidence, anatomy, and pathology of CME - discussed treatment options - start ketorlolac and PF OD QID - F/U 2 weeks -- if no significant improvement, will consider further work up  3. Mild iridocyclitis - pt with history of cold sores - ?herpetic anterior uveitis - will treat empirically with po valtrex 1g BID  4. Age related macular degeneration, non-exudative, OS  - The incidence, anatomy, and pathology of dry AMD, risk of progression, and the AREDS and AREDS 2 study including smoking risks discussed with patient.  - Recommend amsler grid monitoring  5. Pseudophakia OU  - s/p CE/IOL  - beautiful surgery, doing well  - monitor   Ophthalmic Meds Ordered this visit:  Meds ordered this encounter  Medications  . prednisoLONE acetate (PRED FORTE) 1 % ophthalmic suspension    Sig: Place 1 drop into the right eye 4 (four) times daily.    Dispense:  15 mL    Refill:  0  . ketorolac (ACULAR) 0.5 % ophthalmic solution    Sig: Place 1 drop into the right eye 4 (four) times daily.    Dispense:  10 mL    Refill:  0  . valACYclovir (VALTREX) 500 MG tablet    Sig: Take 2 tablets (1,000 mg total) by mouth 2 (two) times daily.    Dispense:  120 tablet    Refill:  1       Return in about 2 weeks (around 07/18/2018) for F/U CME OD, DFE, OCT.  There are no Patient Instructions on file for this visit.   Explained the diagnoses, plan, and follow up with the patient and they expressed understanding.  Patient expressed understanding of the importance of proper follow up care.    This document serves as a record of services personally performed by Gardiner Sleeper, MD, PhD. It was created on their behalf by Ernest Mallick, OA, an ophthalmic assistant. The creation of this record is the provider's dictation and/or activities during the visit.  Electronically signed by: Ernest Mallick, OA  07.11.2019 11:26 PM    Gardiner Sleeper, M.D.,  Ph.D. Diseases & Surgery of the Retina and Vitreous [facility]  I have reviewed the above documentation for accuracy and completeness, and I agree with the above. Gardiner Sleeper, M.D., Ph.D. 07/06/18 11:26 PM    Abbreviations: M myopia (nearsighted); A astigmatism; H hyperopia (farsighted); P presbyopia; Mrx spectacle prescription;  CTL contact lenses; OD right eye; OS left eye; OU both eyes  XT exotropia; ET esotropia; PEK punctate epithelial keratitis; PEE punctate epithelial erosions; DES dry eye syndrome; MGD meibomian gland dysfunction; ATs artificial tears; PFAT's preservative free artificial tears; [place] nuclear sclerotic cataract; PSC posterior subcapsular cataract; ERM epi-retinal membrane; PVD posterior vitreous detachment; RD retinal detachment; DM diabetes mellitus; DR diabetic retinopathy; NPDR non-proliferative diabetic retinopathy; PDR proliferative diabetic retinopathy; CSME clinically significant macular edema; DME diabetic macular edema; dbh dot blot hemorrhages; CWS cotton wool spot; POAG primary open angle glaucoma; C/D cup-to-disc ratio; HVF humphrey visual field; GVF goldmann visual field; OCT optical coherence tomography; IOP intraocular pressure; BRVO Branch retinal vein occlusion; CRVO central retinal vein occlusion; CRAO central retinal artery occlusion; BRAO branch retinal artery occlusion; RT retinal tear; SB scleral buckle; PPV pars plana vitrectomy; VH Vitreous hemorrhage; PRP panretinal laser photocoagulation; IVK intravitreal kenalog; VMT vitreomacular traction; MH Macular hole;  NVD neovascularization of the disc; NVE neovascularization elsewhere; AREDS age related eye disease study; ARMD age related macular degeneration; POAG primary open angle glaucoma; EBMD epithelial/anterior basement membrane dystrophy; ACIOL anterior chamber intraocular lens; IOL intraocular lens; PCIOL posterior chamber intraocular lens; Phaco/IOL phacoemulsification with  intraocular lens placement; [place] photorefractive keratectomy; LASIK laser assisted in situ keratomileusis; HTN hypertension; DM diabetes mellitus; COPD chronic obstructive pulmonary disease

## 2018-07-04 ENCOUNTER — Ambulatory Visit (INDEPENDENT_AMBULATORY_CARE_PROVIDER_SITE_OTHER): Payer: Medicare Other | Admitting: Ophthalmology

## 2018-07-04 ENCOUNTER — Encounter (INDEPENDENT_AMBULATORY_CARE_PROVIDER_SITE_OTHER): Payer: Self-pay | Admitting: Ophthalmology

## 2018-07-04 DIAGNOSIS — Z961 Presence of intraocular lens: Secondary | ICD-10-CM | POA: Diagnosis not present

## 2018-07-04 DIAGNOSIS — H35351 Cystoid macular degeneration, right eye: Secondary | ICD-10-CM

## 2018-07-04 DIAGNOSIS — H209 Unspecified iridocyclitis: Secondary | ICD-10-CM

## 2018-07-04 DIAGNOSIS — H353132 Nonexudative age-related macular degeneration, bilateral, intermediate dry stage: Secondary | ICD-10-CM

## 2018-07-04 DIAGNOSIS — H3581 Retinal edema: Secondary | ICD-10-CM | POA: Diagnosis not present

## 2018-07-04 MED ORDER — PREDNISOLONE ACETATE 1 % OP SUSP: 1.0000 [drp] | Freq: Four times a day (QID) | OPHTHALMIC | 0 refills | Status: DC

## 2018-07-04 MED ORDER — KETOROLAC TROMETHAMINE 0.5 % OP SOLN: 1.0000 [drp] | Freq: Four times a day (QID) | OPHTHALMIC | 0 refills | Status: DC

## 2018-07-04 MED ORDER — VALACYCLOVIR HCL 500 MG PO TABS: 1000.0000 mg | ORAL_TABLET | Freq: Two times a day (BID) | ORAL | 1 refills | Status: AC

## 2018-07-06 ENCOUNTER — Encounter (INDEPENDENT_AMBULATORY_CARE_PROVIDER_SITE_OTHER): Payer: Self-pay | Admitting: Ophthalmology

## 2018-07-07 ENCOUNTER — Encounter (INDEPENDENT_AMBULATORY_CARE_PROVIDER_SITE_OTHER): Payer: BLUE CROSS/BLUE SHIELD | Admitting: Ophthalmology

## 2018-07-17 NOTE — Progress Notes (Signed)
[facility] Note  07/18/2018     CHIEF COMPLAINT Patient presents for Retina Follow Up   HISTORY OF PRESENT ILLNESS: Kyle Garcia is a 67 y.o. male who presents to the clinic today for:   HPI    Retina Follow Up    Patient presents with  Other.  In right eye.  Severity is moderate.  Duration of 2 weeks.  Since onset it is stable.  I, the attending physician,  performed the HPI with the patient and updated documentation appropriately.          Comments    Pt presents for CME OD f/u, pt states VA has improved since last visit, states he sees the occasional floaters, but denies FOL, pain, or wavy vision, pt is using ketorolac and pred forte gtts as directed,        Last edited by Bernarda Caffey, MD on 07/18/2018  8:14 AM. (History)    Pt states reports return to baseline vision soon after initiation of therapy, last visit. Able to read newspaper clearly again  Referring physician: Orlena Sheldon, PA-C [address] [place] [address]  HISTORICAL INFORMATION:   Selected notes from the MEDICAL RECORD NUMBER Referred by Dr. Virgina Evener for cystoid macular edema LEE: 07.10.19 Sammie Bench) [BCVA: OD: 20/20-3 OS: 20/20-1] Ocular Hx-panuveitis, iritis, dry eye syndrome, non-exu ARMD OU, PCO OU, pseudophakia OU ([place]) PMH-high cholesterol, HTN    CURRENT MEDICATIONS: Current Outpatient Medications (Ophthalmic Drugs)  Medication Sig  . ketorolac (ACULAR) 0.5 % ophthalmic solution Place 1 drop into the right eye 4 (four) times daily.  . prednisoLONE acetate (PRED FORTE) 1 % ophthalmic suspension Place 1 drop into the right eye 4 (four) times daily.   No current facility-administered medications for this visit.  (Ophthalmic Drugs)   Current Outpatient Medications (Other)  Medication Sig  . aspirin EC 81 MG tablet Take 81 mg by mouth every other day.  . Multiple Vitamins-Minerals (MULTIVITAMIN WITH MINERALS) tablet Take 1 tablet by mouth.  Marland Kitchen  omeprazole (PRILOSEC) 40 MG capsule Take 1 capsule (40 mg total) by mouth daily.  . pravastatin (PRAVACHOL) 20 MG tablet TAKE 1 TABLET DAILY AT 6 PM  . valACYclovir (VALTREX) 500 MG tablet Take 2 tablets (1,000 mg total) by mouth 2 (two) times daily.   No current facility-administered medications for this visit.  (Other)      REVIEW OF SYSTEMS: ROS    Positive for: Eyes   Negative for: Constitutional, Gastrointestinal, Neurological, Skin, Genitourinary, Musculoskeletal, HENT, Endocrine, Cardiovascular, Respiratory, Psychiatric, Allergic/Imm, Heme/Lymph   Last edited by Debbrah Alar, COT on 07/18/2018  7:56 AM. (History)       ALLERGIES No Known Allergies  PAST MEDICAL HISTORY Past Medical History:  Diagnosis Date  . GERD (gastroesophageal reflux disease)   . Hyperlipidemia    Past Surgical History:  Procedure Laterality Date  . CATARACT EXTRACTION  2016  . EYE SURGERY      FAMILY HISTORY History reviewed. No pertinent family history.  SOCIAL HISTORY Social History   Tobacco Use  . Smoking status: Current Some Day Smoker    Types: Cigars  . Smokeless tobacco: Never Used  Substance Use Topics  . Alcohol use: Yes    Alcohol/week: 2.4 - 4.2 oz    Types: 1 - 3 Glasses of wine, 3 - 4 Cans of beer per week  . Drug use: Yes         OPHTHALMIC  EXAM:  Base Eye Exam    Visual Acuity (Snellen - Linear)      Right Left   Dist [place] 20/40 +2 20/30 -1   Dist ph [place] 20/25 -1 20/25 -1       Tonometry (Tonopen, 8:01 AM)      Right Left   Pressure 14 10       Pupils      Dark Light Shape React APD   Right 3 2 Round Brisk None   Left 3 2 Round Brisk None       Visual Fields (Counting fingers)      Left Right    Full Full       Neuro/Psych    Oriented x3:  Yes   Mood/Affect:  Normal       Dilation    Both eyes:  1.0% Mydriacyl, 2.5% Phenylephrine @ 8:01 AM        Slit Lamp and Fundus Exam    Slit Lamp Exam      Right Left   Lids/Lashes Normal Normal    Conjunctiva/Sclera Temporal Pinguecula Temporal Pinguecula   Cornea Arcus, Temporal Well healed cataract wounds Arcus, Temporal Well healed cataract wounds   Anterior Chamber Deep, No cell or flare Deep and quiet   Iris Round and slightly reactive, Transillumination defects 1100, 0230, 0430, 0600, 0645 - linear and radial  Round and dilated   Lens PC IOL in good position, trace Posterior capsular opacification PC IOL in good position, 1+ Posterior capsular opacification   Vitreous Vitreous syneresis, Posterior vitreous detachment Vitreous syneresis       Fundus Exam      Right Left   Disc mildly blurred margin temporally Pink and Sharp   C/D Ratio 0.1 0.1   Macula Blunted but improved foveal reflex, Retinal pigment epithelial mottling, no CME, Drusen, Mild Superior Epiretinal membrane Flat, Blunted foveal reflex, Retinal pigment epithelial mottling, Drusen, No heme or edema   Vessels Tortuous Normal   Periphery Attached Attached          IMAGING AND PROCEDURES  Imaging and Procedures for @TODAY @  OCT, Retina - OU - Both Eyes       Right Eye Quality was good. Central Foveal Thickness: 353. Progression has improved. Findings include retinal drusen , normal foveal contour, no IRF, no SRF (Trace ERM, mild ellipsoid thinning centrally ).   Left Eye Quality was good. Central Foveal Thickness: 316. Progression has been stable. Findings include normal foveal contour, no IRF, no SRF, retinal drusen .   Notes *Images captured and stored on drive  Diagnosis / Impression:  OD: interval resolution of CME -- mild ellipsoid thinning centrally OD: NFP, No IRF/SRF  Clinical management:  See below  Abbreviations: NFP - Normal foveal profile. CME - cystoid macular edema. PED - pigment epithelial detachment. IRF - intraretinal fluid. SRF - subretinal fluid. EZ - ellipsoid zone. ERM - epiretinal membrane. ORA - outer retinal atrophy. ORT - outer retinal tubulation. SRHM - subretinal  hyper-reflective material                  ASSESSMENT/PLAN:    ICD-10-CM   1. CME (cystoid macular edema), right H35.351 OCT, Retina - OU - Both Eyes  2. Retinal edema H35.81 OCT, Retina - OU - Both Eyes  3. Iridocyclitis of right eye H20.9   4. Intermediate stage nonexudative age-related macular degeneration of both eyes H35.3132   5. Pseudophakia of both eyes Z96.1     1,2.  CME OD- resolved today - unclear etiology; relatively healthy individual with no history of auto-immune or rheumatologic disease - differential includes delayed post op CME / Kathleen Argue, infectious, auto-immune, idiopathic, or other - reviewed incidence, anatomy, and pathology of CME - discussed treatment options - STOP ketorolac  - start PF taper QID x 7 days, TID x 7 days, BID x 7 days, QD x 7 days, then STOP - F/U 4 weeks  3. Mild iridocyclitis - pt with history of cold sores - ?herpetic anterior uveitis - will decrease po valtrex 1g to QDaily - monitor  4. Age related macular degeneration, non-exudative, OS  - The incidence, anatomy, and pathology of dry AMD, risk of progression, and the AREDS and AREDS 2 study including smoking risks discussed with patient.  - continue amsler grid monitoring  5. Pseudophakia OU  - s/p CE/IOL  - beautiful surgery, doing well  - monitor   Ophthalmic Meds Ordered this visit:  Meds ordered this encounter  Medications  . prednisoLONE acetate (PRED FORTE) 1 % ophthalmic suspension    Sig: Place 1 drop into the right eye 4 (four) times daily.    Dispense:  10 mL    Refill:  0       Return in about 1 month (around 08/15/2018) for F/U CME OD, DFE, OCT.  There are no Patient Instructions on file for this visit.   Explained the diagnoses, plan, and follow up with the patient and they expressed understanding.  Patient expressed understanding of the importance of proper follow up care.    This document serves as a record of services personally performed  by Gardiner Sleeper, MD, PhD. It was created on their behalf by Catha Brow, [place], a certified ophthalmic assistant. The creation of this record is the provider's dictation and/or activities during the visit.  Electronically signed by: Catha Brow, COA  07.25.19 8:24 AM   Gardiner Sleeper, M.D., Ph.D. Diseases & Surgery of the Retina and Vitreous [facility]   I have reviewed the above documentation for accuracy and completeness, and I agree with the above. Gardiner Sleeper, M.D., Ph.D. 07/18/18 8:25 AM   Abbreviations: M myopia (nearsighted); A astigmatism; H hyperopia (farsighted); P presbyopia; Mrx spectacle prescription;  CTL contact lenses; OD right eye; OS left eye; OU both eyes  XT exotropia; ET esotropia; PEK punctate epithelial keratitis; PEE punctate epithelial erosions; DES dry eye syndrome; MGD meibomian gland dysfunction; ATs artificial tears; PFAT's preservative free artificial tears; [place] nuclear sclerotic cataract; PSC posterior subcapsular cataract; ERM epi-retinal membrane; PVD posterior vitreous detachment; RD retinal detachment; DM diabetes mellitus; DR diabetic retinopathy; NPDR non-proliferative diabetic retinopathy; PDR proliferative diabetic retinopathy; CSME clinically significant macular edema; DME diabetic macular edema; dbh dot blot hemorrhages; CWS cotton wool spot; POAG primary open angle glaucoma; C/D cup-to-disc ratio; HVF humphrey visual field; GVF goldmann visual field; OCT optical coherence tomography; IOP intraocular pressure; BRVO Branch retinal vein occlusion; CRVO central retinal vein occlusion; CRAO central retinal artery occlusion; BRAO branch retinal artery occlusion; RT retinal tear; SB scleral buckle; PPV pars plana vitrectomy; VH Vitreous hemorrhage; PRP panretinal laser photocoagulation; IVK intravitreal kenalog; VMT vitreomacular traction; MH Macular hole;  NVD neovascularization of the disc; NVE neovascularization elsewhere;  AREDS age related eye disease study; ARMD age related macular degeneration; POAG primary open angle glaucoma; EBMD epithelial/anterior basement membrane dystrophy; ACIOL anterior chamber intraocular lens; IOL intraocular lens; PCIOL posterior chamber intraocular lens; Phaco/IOL phacoemulsification with intraocular lens placement; [place]  photorefractive keratectomy; LASIK laser assisted in situ keratomileusis; HTN hypertension; DM diabetes mellitus; COPD chronic obstructive pulmonary disease

## 2018-07-18 ENCOUNTER — Encounter (INDEPENDENT_AMBULATORY_CARE_PROVIDER_SITE_OTHER): Payer: Self-pay | Admitting: Ophthalmology

## 2018-07-18 ENCOUNTER — Ambulatory Visit (INDEPENDENT_AMBULATORY_CARE_PROVIDER_SITE_OTHER): Payer: Medicare Other | Admitting: Ophthalmology

## 2018-07-18 DIAGNOSIS — Z961 Presence of intraocular lens: Secondary | ICD-10-CM | POA: Diagnosis not present

## 2018-07-18 DIAGNOSIS — H35351 Cystoid macular degeneration, right eye: Secondary | ICD-10-CM | POA: Diagnosis not present

## 2018-07-18 DIAGNOSIS — H353132 Nonexudative age-related macular degeneration, bilateral, intermediate dry stage: Secondary | ICD-10-CM

## 2018-07-18 DIAGNOSIS — H3581 Retinal edema: Secondary | ICD-10-CM | POA: Diagnosis not present

## 2018-07-18 DIAGNOSIS — H209 Unspecified iridocyclitis: Secondary | ICD-10-CM

## 2018-07-18 MED ORDER — PREDNISOLONE ACETATE 1 % OP SUSP: 1.0000 [drp] | Freq: Four times a day (QID) | OPHTHALMIC | 0 refills | Status: DC

## 2018-08-15 ENCOUNTER — Encounter (INDEPENDENT_AMBULATORY_CARE_PROVIDER_SITE_OTHER): Payer: Self-pay | Admitting: Ophthalmology

## 2018-08-15 ENCOUNTER — Ambulatory Visit (INDEPENDENT_AMBULATORY_CARE_PROVIDER_SITE_OTHER): Payer: Medicare Other | Admitting: Ophthalmology

## 2018-08-15 DIAGNOSIS — Z961 Presence of intraocular lens: Secondary | ICD-10-CM | POA: Diagnosis not present

## 2018-08-15 DIAGNOSIS — H353132 Nonexudative age-related macular degeneration, bilateral, intermediate dry stage: Secondary | ICD-10-CM

## 2018-08-15 DIAGNOSIS — H3581 Retinal edema: Secondary | ICD-10-CM

## 2018-08-15 DIAGNOSIS — H35351 Cystoid macular degeneration, right eye: Secondary | ICD-10-CM

## 2018-08-15 DIAGNOSIS — H209 Unspecified iridocyclitis: Secondary | ICD-10-CM

## 2018-08-15 NOTE — Progress Notes (Addendum)
[facility] Note  08/15/2018     CHIEF COMPLAINT Patient presents for Retina Follow Up   HISTORY OF PRESENT ILLNESS: Kyle Garcia is a 67 y.o. male who presents to the clinic today for:   HPI    Retina Follow Up    Patient presents with  Other.  In right eye.  This started 1 month ago.  Severity is mild.  Since onset it is rapidly improving.  I, the attending physician,  performed the HPI with the patient and updated documentation appropriately.          Comments    F/U CME OD. Patient states he no longer has floaters, VA his improved rapidly "100%". Completed PF gtt's       Last edited by Bernarda Caffey, MD on 08/15/2018  8:24 AM. (History)    Pt states reports return to baseline vision soon after initiation of therapy, last visit. Able to read newspaper clearly again. Completed PF taper.  Referring physician: Orlena Sheldon, PA-C [address] [place] [address]  HISTORICAL INFORMATION:   Selected notes from the MEDICAL RECORD NUMBER Referred by Dr. Virgina Evener for cystoid macular edema LEE: 07.10.19 Sammie Bench) [BCVA: OD: 20/20-3 OS: 20/20-1] Ocular Hx-panuveitis, iritis, dry eye syndrome, non-exu ARMD OU, PCO OU, pseudophakia OU ([place]) PMH-high cholesterol, HTN    CURRENT MEDICATIONS: Current Outpatient Medications (Ophthalmic Drugs)  Medication Sig  . ketorolac (ACULAR) 0.5 % ophthalmic solution Place 1 drop into the right eye 4 (four) times daily.  . prednisoLONE acetate (PRED FORTE) 1 % ophthalmic suspension Place 1 drop into the right eye 4 (four) times daily.   No current facility-administered medications for this visit.  (Ophthalmic Drugs)   Current Outpatient Medications (Other)  Medication Sig  . aspirin EC 81 MG tablet Take 81 mg by mouth every other day.  . Multiple Vitamins-Minerals (MULTIVITAMIN WITH MINERALS) tablet Take 1 tablet by mouth.  Marland Kitchen omeprazole (PRILOSEC) 40 MG capsule Take 1 capsule (40 mg total) by  mouth daily.  . pravastatin (PRAVACHOL) 20 MG tablet TAKE 1 TABLET DAILY AT 6 PM   No current facility-administered medications for this visit.  (Other)      REVIEW OF SYSTEMS: ROS    Positive for: Eyes   Negative for: Constitutional, Gastrointestinal, Neurological, Skin, Genitourinary, Musculoskeletal, HENT, Endocrine, Cardiovascular, Respiratory, Psychiatric, Allergic/Imm, Heme/Lymph   Last edited by Zenovia Jordan, LPN on 5/68/1275  1:70 AM. (History)       ALLERGIES No Known Allergies  PAST MEDICAL HISTORY Past Medical History:  Diagnosis Date  . GERD (gastroesophageal reflux disease)   . Hyperlipidemia    Past Surgical History:  Procedure Laterality Date  . CATARACT EXTRACTION  2016  . EYE SURGERY      FAMILY HISTORY History reviewed. No pertinent family history.  SOCIAL HISTORY Social History   Tobacco Use  . Smoking status: Current Some Day Smoker    Types: Cigars  . Smokeless tobacco: Never Used  Substance Use Topics  . Alcohol use: Yes    Alcohol/week: 4.0 - 7.0 standard drinks    Types: 1 - 3 Glasses of wine, 3 - 4 Cans of beer per week  . Drug use: Yes         OPHTHALMIC EXAM:  Base Eye Exam    Visual Acuity (Snellen - Linear)      Right Left   Dist [place] 20/40 +2 20/30 +3   Dist ph [place] 20/25 +  1 20/25       Tonometry (Tonopen, 8:12 AM)      Right Left   Pressure 15 17       Pupils      Dark Light Shape React APD   Right 4 3 Round Brisk None   Left 4 3 Round Brisk None       Visual Fields (Counting fingers)      Left Right    Full Full       Extraocular Movement      Right Left    Full, Ortho Full, Ortho       Neuro/Psych    Oriented x3:  Yes   Mood/Affect:  Normal       Dilation    Both eyes:  1.0% Mydriacyl, 2.5% Phenylephrine @ 8:13 AM        Slit Lamp and Fundus Exam    Slit Lamp Exam      Right Left   Lids/Lashes Normal Normal   Conjunctiva/Sclera Temporal Pinguecula Temporal Pinguecula   Cornea Arcus,  Temporal Well healed cataract wounds Arcus, Temporal Well healed cataract wounds   Anterior Chamber Deep, No cell or flare Deep and quiet   Iris Round and slightly reactive, Transillumination defects 1100, 0230, 0430, 0600, 0645 - linear and radial  Round and dilated   Lens PC IOL in good position, trace Posterior capsular opacification PC IOL in good position, 1+ Posterior capsular opacification   Vitreous Vitreous syneresis, Posterior vitreous detachment Vitreous syneresis       Fundus Exam      Right Left   Disc mildly blurred margin temporally Pink and Sharp   C/D Ratio 0.1 0.1   Macula Blunted but improved foveal reflex, Retinal pigment epithelial mottling, no CME, Drusen, Mild Superior Epiretinal membrane Flat, Blunted foveal reflex, Retinal pigment epithelial mottling, Drusen, No heme or edema   Vessels Tortuous Normal   Periphery Attached Attached          IMAGING AND PROCEDURES  Imaging and Procedures for @TODAY @  OCT, Retina - OU - Both Eyes       Right Eye Quality was good. Central Foveal Thickness: 331. Progression has been stable. Findings include retinal drusen , normal foveal contour, no IRF, no SRF (Trace ERM).   Left Eye Quality was good. Central Foveal Thickness: 312. Progression has been stable. Findings include normal foveal contour, no IRF, no SRF, retinal drusen .   Notes *Images captured and stored on drive  Diagnosis / Impression:  OD: NFP, no IRF/SRF -- CME remains resolved OD: NFP, No IRF/SRF  Clinical management:  See below  Abbreviations: NFP - Normal foveal profile. CME - cystoid macular edema. PED - pigment epithelial detachment. IRF - intraretinal fluid. SRF - subretinal fluid. EZ - ellipsoid zone. ERM - epiretinal membrane. ORA - outer retinal atrophy. ORT - outer retinal tubulation. SRHM - subretinal hyper-reflective material                  ASSESSMENT/PLAN:    ICD-10-CM   1. CME (cystoid macular edema), right H35.351 OCT,  Retina - OU - Both Eyes  2. Retinal edema H35.81 OCT, Retina - OU - Both Eyes  3. Iridocyclitis of right eye H20.9   4. Intermediate stage nonexudative age-related macular degeneration of both eyes H35.3132   5. Pseudophakia of both eyes Z96.1     1,2. CME OD- remains resolved today - unclear etiology; relatively healthy individual with no history of auto-immune or rheumatologic disease -  likely delayed post op CME / Kathleen Argue, but differential includes infectious, auto-immune, idiopathic, or other - completed ketorolac and PF taper without recurrence - F/U prn  3. Mild iridocyclitis - pt with history of cold sores - ?herpetic anterior uveitis - cont po valtrex 1g to QDaily until prescription runs out - monitor  4. Age related macular degeneration, non-exudative, OS  - The incidence, anatomy, and pathology of dry AMD, risk of progression, and the AREDS and AREDS 2 study including smoking risks discussed with patient.  - continue amsler gris monitoring  5. Pseudophakia OU  - s/p CE/IOL  - beautiful surgery, doing well  - monitor   Ophthalmic Meds Ordered this visit:  No orders of the defined types were placed in this encounter.      Return if symptoms worsen or fail to improve.  There are no Patient Instructions on file for this visit.   Explained the diagnoses, plan, and follow up with the patient and they expressed understanding.  Patient expressed understanding of the importance of proper follow up care.    This document serves as a record of services personally performed by Gardiner Sleeper, MD, PhD. It was created on their behalf by Catha Brow, [place], a certified ophthalmic assistant. The creation of this record is the provider's dictation and/or activities during the visit.  Electronically signed by: Catha Brow, [place]  08.23.19 8:35 AM   Gardiner Sleeper, M.D., Ph.D. Diseases & Surgery of the Retina and Vitreous [facility]  I have  reviewed the above documentation for accuracy and completeness, and I agree with the above. Gardiner Sleeper, M.D., Ph.D. 08/15/18 8:35 AM    Abbreviations: M myopia (nearsighted); A astigmatism; H hyperopia (farsighted); P presbyopia; Mrx spectacle prescription;  CTL contact lenses; OD right eye; OS left eye; OU both eyes  XT exotropia; ET esotropia; PEK punctate epithelial keratitis; PEE punctate epithelial erosions; DES dry eye syndrome; MGD meibomian gland dysfunction; ATs artificial tears; PFAT's preservative free artificial tears; [place] nuclear sclerotic cataract; PSC posterior subcapsular cataract; ERM epi-retinal membrane; PVD posterior vitreous detachment; RD retinal detachment; DM diabetes mellitus; DR diabetic retinopathy; NPDR non-proliferative diabetic retinopathy; PDR proliferative diabetic retinopathy; CSME clinically significant macular edema; DME diabetic macular edema; dbh dot blot hemorrhages; CWS cotton wool spot; POAG primary open angle glaucoma; C/D cup-to-disc ratio; HVF humphrey visual field; GVF goldmann visual field; OCT optical coherence tomography; IOP intraocular pressure; BRVO Branch retinal vein occlusion; CRVO central retinal vein occlusion; CRAO central retinal artery occlusion; BRAO branch retinal artery occlusion; RT retinal tear; SB scleral buckle; PPV pars plana vitrectomy; VH Vitreous hemorrhage; PRP panretinal laser photocoagulation; IVK intravitreal kenalog; VMT vitreomacular traction; MH Macular hole;  NVD neovascularization of the disc; NVE neovascularization elsewhere; AREDS age related eye disease study; ARMD age related macular degeneration; POAG primary open angle glaucoma; EBMD epithelial/anterior basement membrane dystrophy; ACIOL anterior chamber intraocular lens; IOL intraocular lens; PCIOL posterior chamber intraocular lens; Phaco/IOL phacoemulsification with intraocular lens placement; [place] photorefractive keratectomy; LASIK laser assisted in situ keratomileusis;  HTN hypertension; DM diabetes mellitus; COPD chronic obstructive pulmonary disease

## 2018-08-16 ENCOUNTER — Other Ambulatory Visit: Payer: Self-pay | Admitting: Physician Assistant

## 2018-09-04 ENCOUNTER — Ambulatory Visit (INDEPENDENT_AMBULATORY_CARE_PROVIDER_SITE_OTHER): Payer: Medicare Other | Admitting: Family Medicine

## 2018-09-04 DIAGNOSIS — Z23 Encounter for immunization: Secondary | ICD-10-CM

## 2018-09-30 DIAGNOSIS — H353131 Nonexudative age-related macular degeneration, bilateral, early dry stage: Secondary | ICD-10-CM | POA: Diagnosis not present

## 2018-09-30 DIAGNOSIS — H04123 Dry eye syndrome of bilateral lacrimal glands: Secondary | ICD-10-CM | POA: Diagnosis not present

## 2018-09-30 DIAGNOSIS — H35351 Cystoid macular degeneration, right eye: Secondary | ICD-10-CM | POA: Diagnosis not present

## 2018-09-30 DIAGNOSIS — H26493 Other secondary cataract, bilateral: Secondary | ICD-10-CM | POA: Diagnosis not present

## 2018-09-30 DIAGNOSIS — H3561 Retinal hemorrhage, right eye: Secondary | ICD-10-CM | POA: Diagnosis not present

## 2018-10-28 ENCOUNTER — Telehealth: Payer: Self-pay

## 2018-10-28 NOTE — Telephone Encounter (Signed)
Dr. Posey Pronto called requesting these labs be drawn at next physical due to patient having  Retinal heme in right eye.   CBC A1c Lipid  And make sure blood pressure is checked   Explained to Dr Posey Pronto that [facility] is no longer with our office, but that I would  Make a note for whoever does the physical. At the next appointment

## 2018-11-07 ENCOUNTER — Other Ambulatory Visit: Payer: Self-pay

## 2018-11-27 ENCOUNTER — Encounter: Payer: Medicare Other | Admitting: Physician Assistant

## 2018-12-02 ENCOUNTER — Ambulatory Visit (INDEPENDENT_AMBULATORY_CARE_PROVIDER_SITE_OTHER): Payer: Medicare Other | Admitting: Family Medicine

## 2018-12-02 ENCOUNTER — Encounter: Payer: Self-pay | Admitting: Family Medicine

## 2018-12-02 VITALS — BP 130/84 | HR 70 | Temp 97.7°F | Resp 12 | Ht 70.0 in | Wt 177.0 lb

## 2018-12-02 DIAGNOSIS — E7800 Pure hypercholesterolemia, unspecified: Secondary | ICD-10-CM

## 2018-12-02 DIAGNOSIS — D229 Melanocytic nevi, unspecified: Secondary | ICD-10-CM | POA: Diagnosis not present

## 2018-12-02 DIAGNOSIS — R7309 Other abnormal glucose: Secondary | ICD-10-CM | POA: Diagnosis not present

## 2018-12-02 DIAGNOSIS — Z23 Encounter for immunization: Secondary | ICD-10-CM | POA: Diagnosis not present

## 2018-12-02 DIAGNOSIS — R800 Isolated proteinuria: Secondary | ICD-10-CM | POA: Diagnosis not present

## 2018-12-02 DIAGNOSIS — H209 Unspecified iridocyclitis: Secondary | ICD-10-CM

## 2018-12-02 DIAGNOSIS — Z0000 Encounter for general adult medical examination without abnormal findings: Secondary | ICD-10-CM | POA: Diagnosis not present

## 2018-12-02 DIAGNOSIS — Z125 Encounter for screening for malignant neoplasm of prostate: Secondary | ICD-10-CM | POA: Diagnosis not present

## 2018-12-02 NOTE — Addendum Note (Signed)
Addended by: Shary Decamp B on: 12/02/2018 12:42 PM   Modules accepted: Orders

## 2018-12-02 NOTE — Progress Notes (Signed)
Subjective:    Patient ID: Kyle Garcia, male    DOB: Oct 16, 1951, 67 y.o.   MRN: [id number]  HPI  Patient is here today for complete physical exam.  There are a few other issues he wants to discuss.  Over the last year, he has had 2 separate incidences of iritis.  His ophthalmologist has recommended lab work to rule out diabetes mellitus.  His last fasting blood sugar in December of last year was outstanding.  He denies any polyuria polydipsia or blurry vision.  However I would be happy to check a hemoglobin A1c in accordance with his ophthalmologist recommendations.  He also recently had a physical with his insurance and was found to have isolated proteinuria +1 on a urine dipstick.  He is very concerned about this and wants to follow-up on that issue.  His flu shot is up-to-date.  He had Prevnar 13 last year.  He is due for Pneumovax 23 this year.  He has had Zostavax in the past.  He has never had Shingrix.  I recommended Pneumovax 23 and also recommended that he inquire about the cost of the shingles vaccine from [facility].  His colonoscopy was performed by Dr. Collene Mares in 2016 and was normal.  He is due for a PSA to check for prostate cancer. Past Medical History:  Diagnosis Date  . GERD (gastroesophageal reflux disease)   . Hyperlipidemia    Past Surgical History:  Procedure Laterality Date  . CATARACT EXTRACTION  2016  . EYE SURGERY     Current Outpatient Medications on File Prior to Visit  Medication Sig Dispense Refill  . aspirin EC 81 MG tablet Take 81 mg by mouth every other day.    . Multiple Vitamins-Minerals (MULTIVITAMIN WITH MINERALS) tablet Take 1 tablet by mouth.    Marland Kitchen omeprazole (PRILOSEC) 40 MG capsule Take 1 capsule (40 mg total) by mouth daily. 90 capsule 3  . pravastatin (PRAVACHOL) 20 MG tablet TAKE 1 TABLET DAILY AT 6 PM 90 tablet 1   No current facility-administered medications on file prior to visit.    No Known Allergies Social History   Socioeconomic History  .  Marital status: Married    Spouse name: Not on file  . Number of children: Not on file  . Years of education: Not on file  . Highest education level: Not on file  Occupational History  . Not on file  Social Needs  . Financial resource strain: Not on file  . Food insecurity:    Worry: Not on file    Inability: Not on file  . Transportation needs:    Medical: Not on file    Non-medical: Not on file  Tobacco Use  . Smoking status: Current Some Day Smoker    Types: Cigars  . Smokeless tobacco: Never Used  Substance and Sexual Activity  . Alcohol use: Yes    Alcohol/week: 4.0 - 7.0 standard drinks    Types: 1 - 3 Glasses of wine, 3 - 4 Cans of beer per week  . Drug use: Yes  . Sexual activity: Not on file  Lifestyle  . Physical activity:    Days per week: Not on file    Minutes per session: Not on file  . Stress: Not on file  Relationships  . Social connections:    Talks on phone: Not on file    Gets together: Not on file    Attends religious service: Not on file    Active member of  club or organization: Not on file    Attends meetings of clubs or organizations: Not on file    Relationship status: Not on file  . Intimate partner violence:    Fear of current or ex partner: Not on file    Emotionally abused: Not on file    Physically abused: Not on file    Forced sexual activity: Not on file  Other Topics Concern  . Not on file  Social History Narrative  . Not on file   No family history on file.  Review of Systems  All other systems reviewed and are negative.      Objective:   Physical Exam  Constitutional: He is oriented to person, place, and time. He appears well-developed and well-nourished. No distress.  HENT:  Head: Normocephalic and atraumatic.  Right Ear: External ear normal.  Left Ear: External ear normal.  Nose: Nose normal.  Mouth/Throat: Oropharynx is clear and moist. No oropharyngeal exudate.  Eyes: Pupils are equal, round, and reactive to light.  Conjunctivae and EOM are normal. Right eye exhibits no discharge. Left eye exhibits no discharge. No scleral icterus.  Neck: Normal range of motion. Neck supple. No JVD present. No tracheal deviation present. No thyromegaly present.  Cardiovascular: Normal rate, regular rhythm, normal heart sounds and intact distal pulses. Exam reveals no friction rub.  No murmur heard. Pulmonary/Chest: Effort normal and breath sounds normal. No stridor. No respiratory distress. He has no wheezes. He has no rales. He exhibits no tenderness.    Abdominal: Soft. Bowel sounds are normal. He exhibits no distension and no mass. There is no tenderness. There is no rebound and no guarding. No hernia.  Musculoskeletal: Normal range of motion. He exhibits no edema.  Lymphadenopathy:    He has no cervical adenopathy.  Neurological: He is alert and oriented to person, place, and time. He displays normal reflexes. No cranial nerve deficit or sensory deficit. He exhibits normal muscle tone. Coordination normal.  Skin: No rash noted. He is not diaphoretic. No erythema. No pallor.  Psychiatric: He has a normal mood and affect. His behavior is normal. Judgment and thought content normal.  Vitals reviewed. Patient has 2 atypical moles diagrammed and red.  One is a large brown mole approximately 10 mm in diameter with a dark hyperpigmented circular lesion at the upper 11:00 portion of the mole which is a separate color.  The second mole is a conglomeration of 2 moles.  One has a sharp border but growing off the side of that mole at 3:00 is a faint brown irregularly shaped projection        Assessment & Plan:  Isolated non-nephrotic proteinuria - Plan: Urinalysis, Routine w reflex microscopic  Iritis - Plan: Hemoglobin A1c  Prostate cancer screening - Plan: PSA  Pure hypercholesterolemia - Plan: CBC with Differential/Platelet, COMPLETE METABOLIC PANEL WITH GFR, Lipid panel  General medical exam - Plan: CBC with  Differential/Platelet, COMPLETE METABOLIC PANEL WITH GFR, Lipid panel, PSA, Urinalysis, Routine w reflex microscopic, Hemoglobin A1c  Atypical mole - Plan: Ambulatory referral to Dermatology  Patient's exam today is outstanding aside from the 2 irregular moles that I have described above.  I have recommended a dermatology consultation to evaluate this further which I will happily schedule for the patient.  I will follow his ophthalmologist recommendation and check a hemoglobin A1c although I see no evidence of diabetes on his previous lab work.  I will also repeat a urinalysis.  If there is persistent proteinuria, I  would proceed to a 24-hour collection to evaluate the true amount of proteinuria.  Patient may benefit from an ACE inhibitor if there is substantial proteinuria.  He received his Pneumovax 23 today.  Flu shot is up-to-date.  Recommended Zostavax.  Colonoscopy is up-to-date.  I will screen for prostate cancer with a PSA.  Also check a CBC, CMP, and fasting lipid panel to monitor his hyperlipidemia.  Blood pressures acceptable.

## 2018-12-03 LAB — URINALYSIS, ROUTINE W REFLEX MICROSCOPIC
BILIRUBIN URINE: NEGATIVE
Glucose, UA: NEGATIVE
Hgb urine dipstick: NEGATIVE
Ketones, ur: NEGATIVE
Leukocytes, UA: NEGATIVE
NITRITE: NEGATIVE
PROTEIN: NEGATIVE
Specific Gravity, Urine: 1.004 (ref 1.001–1.03)
pH: 7 (ref 5.0–8.0)

## 2018-12-03 LAB — HEMOGLOBIN A1C
Hgb A1c MFr Bld: 5.3 % of total Hgb (ref <5.7)
MEAN PLASMA GLUCOSE: 105 (calc)
eAG (mmol/L): 5.8 (calc)

## 2018-12-03 LAB — COMPLETE METABOLIC PANEL WITH GFR
AG Ratio: 1.5 (calc) (ref 1.0–2.5)
ALT: 12 U/L (ref 9–46)
AST: 14 U/L (ref 10–35)
Albumin: 4.2 g/dL (ref 3.6–5.1)
Alkaline phosphatase (APISO): 69 U/L (ref 40–115)
BILIRUBIN TOTAL: 0.6 mg/dL (ref 0.2–1.2)
BUN: 14 mg/dL (ref 7–25)
CALCIUM: 9.1 mg/dL (ref 8.6–10.3)
CO2: 25 mmol/L (ref 20–32)
CREATININE: 0.85 mg/dL (ref 0.70–1.25)
Chloride: 104 mmol/L (ref 98–110)
GFR, Est African American: 105 mL/min/{1.73_m2} (ref >=60)
GFR, Est Non African American: 91 mL/min/{1.73_m2} (ref >=60)
Globulin: 2.8 g/dL (calc) (ref 1.9–3.7)
Glucose, Bld: 88 mg/dL (ref 65–99)
Potassium: 4.2 mmol/L (ref 3.5–5.3)
SODIUM: 137 mmol/L (ref 135–146)
TOTAL PROTEIN: 7 g/dL (ref 6.1–8.1)

## 2018-12-03 LAB — CBC WITH DIFFERENTIAL/PLATELET
Basophils Absolute: 38 cells/uL (ref 0–200)
Basophils Relative: 0.7 %
EOS PCT: 1.5 %
Eosinophils Absolute: 81 cells/uL (ref 15–500)
HCT: 48.2 % (ref 38.5–50.0)
HEMOGLOBIN: 15.9 g/dL (ref 13.2–17.1)
LYMPHS ABS: 1296 {cells}/uL (ref 850–3900)
MCH: 29.8 pg (ref 27.0–33.0)
MCHC: 33 g/dL (ref 32.0–36.0)
MCV: 90.4 fL (ref 80.0–100.0)
MPV: 10.9 fL (ref 7.5–12.5)
Monocytes Relative: 10 %
NEUTROS ABS: 3445 {cells}/uL (ref 1500–7800)
NEUTROS PCT: 63.8 %
Platelets: 203 10*3/uL (ref 140–400)
RBC: 5.33 10*6/uL (ref 4.20–5.80)
RDW: 11.7 % (ref 11.0–15.0)
Total Lymphocyte: 24 %
WBC: 5.4 10*3/uL (ref 3.8–10.8)
WBCMIX: 540 {cells}/uL (ref 200–950)

## 2018-12-03 LAB — PSA: PSA: 2.3 ng/mL (ref <=4.0)

## 2018-12-03 LAB — LIPID PANEL
CHOLESTEROL: 164 mg/dL (ref <200)
HDL: 49 mg/dL (ref >40)
LDL Cholesterol (Calc): 99 mg/dL (calc)
NON-HDL CHOLESTEROL (CALC): 115 mg/dL (ref <130)
TRIGLYCERIDES: 73 mg/dL (ref <150)
Total CHOL/HDL Ratio: 3.3 (calc) (ref <5.0)

## 2018-12-30 ENCOUNTER — Other Ambulatory Visit: Payer: Self-pay | Admitting: *Deleted

## 2018-12-30 DIAGNOSIS — K219 Gastro-esophageal reflux disease without esophagitis: Secondary | ICD-10-CM

## 2018-12-30 DIAGNOSIS — H26493 Other secondary cataract, bilateral: Secondary | ICD-10-CM | POA: Diagnosis not present

## 2018-12-30 DIAGNOSIS — H04123 Dry eye syndrome of bilateral lacrimal glands: Secondary | ICD-10-CM | POA: Diagnosis not present

## 2018-12-30 DIAGNOSIS — H35351 Cystoid macular degeneration, right eye: Secondary | ICD-10-CM | POA: Diagnosis not present

## 2018-12-30 DIAGNOSIS — H353131 Nonexudative age-related macular degeneration, bilateral, early dry stage: Secondary | ICD-10-CM | POA: Diagnosis not present

## 2018-12-30 MED ORDER — OMEPRAZOLE 40 MG PO CPDR: 40.0000 mg | DELAYED_RELEASE_CAPSULE | Freq: Every day | ORAL | 3 refills | Status: DC

## 2019-02-17 ENCOUNTER — Other Ambulatory Visit: Payer: Self-pay | Admitting: Family Medicine

## 2019-02-18 ENCOUNTER — Encounter: Payer: Self-pay | Admitting: Family Medicine

## 2019-02-18 ENCOUNTER — Ambulatory Visit (INDEPENDENT_AMBULATORY_CARE_PROVIDER_SITE_OTHER): Payer: Medicare Other | Admitting: Family Medicine

## 2019-02-18 VITALS — BP 138/80 | HR 87 | Temp 98.4°F | Resp 15 | Ht 70.0 in | Wt 173.5 lb

## 2019-02-18 DIAGNOSIS — J069 Acute upper respiratory infection, unspecified: Secondary | ICD-10-CM

## 2019-02-18 NOTE — Progress Notes (Signed)
Patient ID: Kyle Garcia, male    DOB: 01-19-1951, 68 y.o.   MRN: [id number]  PCP: Susy Frizzle, MD  Chief Complaint  Patient presents with  . Cough    Patient in today with c/o cough, nasal congestion. Onset last week.    Subjective:   Kyle Garcia is a 68 y.o. male, presents to clinic with CC of 3 weeks of uri sx nasal sx and mild cough, one week ago he had ear pain and fever that woke him from his sleep, he had sweats that morning and then again felt better.  Over the last week he had scratchy throat, nasal drainage and coughing start again.  He denies sinus pain pressure headache, fever, chills, sweats, fatigue, malaise, myalgias, shortness of breath, wheeze, chest pain.  Recently around grandchildren who had fever cold/cough sx. No hx of asthma, COPD - "fair weather smoker" occasional cigar  Patient Active Problem List   Diagnosis Date Noted  . GERD (gastroesophageal reflux disease) 11/14/2015  . Hyperlipemia 11/14/2015     Prior to Admission medications   Medication Sig Start Date End Date Taking? Authorizing Provider  aspirin EC 81 MG tablet Take 81 mg by mouth every other day. 06/22/09  Yes [provider]  Multiple Vitamins-Minerals (MULTIVITAMIN WITH MINERALS) tablet Take 1 tablet by mouth. 05/19/10  Yes [provider]  omeprazole (PRILOSEC) 40 MG capsule Take 1 capsule (40 mg total) by mouth daily. 12/30/18  Yes Susy Frizzle, MD  pravastatin (PRAVACHOL) 20 MG tablet TAKE 1 TABLET DAILY AT 6 PM 02/18/19  Yes Susy Frizzle, MD     No Known Allergies   No family history on file.   Social History   Socioeconomic History  . Marital status: Married    Spouse name: Not on file  . Number of children: Not on file  . Years of education: Not on file  . Highest education level: Not on file  Occupational History  . Not on file  Social Needs  . Financial resource strain: Not on file  . Food insecurity:    Worry: Not on file   Inability: Not on file  . Transportation needs:    Medical: Not on file    Non-medical: Not on file  Tobacco Use  . Smoking status: Current Some Day Smoker    Types: Cigars  . Smokeless tobacco: Never Used  Substance and Sexual Activity  . Alcohol use: Yes    Alcohol/week: 4.0 - 7.0 standard drinks    Types: 1 - 3 Glasses of wine, 3 - 4 Cans of beer per week  . Drug use: Yes  . Sexual activity: Not on file  Lifestyle  . Physical activity:    Days per week: Not on file    Minutes per session: Not on file  . Stress: Not on file  Relationships  . Social connections:    Talks on phone: Not on file    Gets together: Not on file    Attends religious service: Not on file    Active member of club or organization: Not on file    Attends meetings of clubs or organizations: Not on file    Relationship status: Not on file  . Intimate partner violence:    Fear of current or ex partner: Not on file    Emotionally abused: Not on file    Physically abused: Not on file    Forced sexual activity: Not on file  Other Topics Concern  .  Not on file  Social History Narrative  . Not on file     Review of Systems  Constitutional: Negative.   HENT: Negative.   Eyes: Negative.   Respiratory: Negative.   Cardiovascular: Negative.   Gastrointestinal: Negative.   Endocrine: Negative.   Genitourinary: Negative.   Musculoskeletal: Negative.   Skin: Negative.   Allergic/Immunologic: Negative.   Neurological: Negative.   Hematological: Negative.   Psychiatric/Behavioral: Negative.   All other systems reviewed and are negative.      Objective:    Vitals:   02/18/19 1436  BP: 138/80  Pulse: 87  Resp: 15  Temp: 98.4 F (36.9 C)  TempSrc: Oral  SpO2: 96%  Weight: 173 lb 8 oz (78.7 kg)  Height: 5\' 10"  (1.778 m)      Physical Exam Vitals signs and nursing note reviewed.  Constitutional:      General: He is not in acute distress.    Appearance: Normal appearance. He is  well-developed and normal weight. He is not ill-appearing, toxic-appearing or diaphoretic.     Comments: Well appearing male, looks younger than stated age  HENT:     Head: Normocephalic and atraumatic.     Jaw: No trismus.     Right Ear: Tympanic membrane, ear canal and external ear normal. There is no impacted cerumen.     Left Ear: Tympanic membrane, ear canal and external ear normal. There is no impacted cerumen.     Nose: Rhinorrhea present. No congestion.     Right Sinus: No maxillary sinus tenderness or frontal sinus tenderness.     Left Sinus: No maxillary sinus tenderness or frontal sinus tenderness.     Mouth/Throat:     Mouth: Mucous membranes are not pale, not dry and not cyanotic.     Pharynx: Uvula midline. Posterior oropharyngeal erythema present. No oropharyngeal exudate or uvula swelling.     Tonsils: No tonsillar exudate or tonsillar abscesses.  Eyes:     General: Lids are normal. No scleral icterus.       Right eye: No discharge.        Left eye: No discharge.     Conjunctiva/sclera: Conjunctivae normal.     Pupils: Pupils are equal, round, and reactive to light.  Neck:     Musculoskeletal: Normal range of motion and neck supple.     Trachea: Trachea and phonation normal. No tracheal deviation.  Cardiovascular:     Rate and Rhythm: Normal rate and regular rhythm.     Pulses: Normal pulses.          Radial pulses are 2+ on the right side and 2+ on the left side.     Heart sounds: Normal heart sounds. No murmur. No friction rub. No gallop.   Pulmonary:     Effort: Pulmonary effort is normal. No tachypnea, accessory muscle usage or respiratory distress.     Breath sounds: Normal breath sounds. No stridor. No decreased breath sounds, wheezing, rhonchi or rales.  Chest:     Chest wall: No tenderness.  Abdominal:     General: Bowel sounds are normal. There is no distension.     Palpations: Abdomen is soft.     Tenderness: There is no abdominal tenderness.    Musculoskeletal: Normal range of motion.     Right lower leg: No edema.     Left lower leg: No edema.  Lymphadenopathy:     Cervical: No cervical adenopathy.  Skin:    General: Skin is warm and  dry.     Capillary Refill: Capillary refill takes less than 2 seconds.     Coloration: Skin is not jaundiced or pale.     Findings: No rash.     Nails: There is no clubbing.   Neurological:     Mental Status: He is alert and oriented to person, place, and time.     Motor: No abnormal muscle tone.     Coordination: Coordination normal.     Gait: Gait normal.  Psychiatric:        Speech: Speech normal.        Behavior: Behavior normal. Behavior is cooperative.           Assessment & Plan:   68 y/o male, intermittent URI sx with non-productive cough over the past 3 weeks. Is well appearing, afebrile, mild URI findings on exam, lungs CTA, suspect viral illness, tx supportive and symptomatic.  Encouraged to call us or follow up with any prolonged sx or worsening.    ICD-10-CM   1. Upper respiratory tract infection, unspecified type J06.9        Delsa Grana, PA-C 02/18/19 2:42 PM

## 2019-02-18 NOTE — Patient Instructions (Signed)
Can try mucinex with decongestant or cough meds  Push fluids  Follow up with any worsening   Upper Respiratory Infection, Adult An upper respiratory infection (URI) affects the nose, throat, and upper air passages. URIs are caused by germs (viruses). The most common type of URI is often called "the common cold." Medicines cannot cure URIs, but you can do things at home to relieve your symptoms. URIs usually get better within 7-10 days. Follow these instructions at home: Activity  Rest as needed.  If you have a fever, stay home from work or school until your fever is gone, or until your doctor says you may return to work or school. ? You should stay home until you cannot spread the infection anymore (you are not contagious). ? Your doctor may have you wear a face mask so you have less risk of spreading the infection. Relieving symptoms  Gargle with a salt-water mixture 3-4 times a day or as needed. To make a salt-water mixture, completely dissolve -1 tsp of salt in 1 cup of warm water.  Use a cool-mist humidifier to add moisture to the air. This can help you breathe more easily. Eating and drinking   Drink enough fluid to keep your pee (urine) pale yellow.  Eat soups and other clear broths. General instructions   Take over-the-counter and prescription medicines only as told by your doctor. These include cold medicines, fever reducers, and cough suppressants.  Do not use any products that contain nicotine or tobacco. These include cigarettes and e-cigarettes. If you need help quitting, ask your doctor.  Avoid being where people are smoking (avoid secondhand smoke).  Make sure you get regular shots and get the flu shot every year.  Keep all follow-up visits as told by your doctor. This is important. How to avoid spreading infection to others   Wash your hands often with soap and water. If you do not have soap and water, use hand sanitizer.  Avoid touching your mouth, face,  eyes, or nose.  Cough or sneeze into a tissue or your sleeve or elbow. Do not cough or sneeze into your hand or into the air. Contact a doctor if:  You are getting worse, not better.  You have any of these: ? A fever. ? Chills. ? Brown or red mucus in your nose. ? Yellow or brown fluid (discharge)coming from your nose. ? Pain in your face, especially when you bend forward. ? Swollen neck glands. ? Pain with swallowing. ? White areas in the back of your throat. Get help right away if:  You have shortness of breath that gets worse.  You have very bad or constant: ? Headache. ? Ear pain. ? Pain in your forehead, behind your eyes, and over your cheekbones (sinus pain). ? Chest pain.  You have long-lasting (chronic) lung disease along with any of these: ? Wheezing. ? Long-lasting cough. ? Coughing up blood. ? A change in your usual mucus.  You have a stiff neck.  You have changes in your: ? Vision. ? Hearing. ? Thinking. ? Mood. Summary  An upper respiratory infection (URI) is caused by a germ called a virus. The most common type of URI is often called "the common cold."  URIs usually get better within 7-10 days.  Take over-the-counter and prescription medicines only as told by your doctor. This information is not intended to replace advice given to you by your health care provider. Make sure you discuss any questions you have with your health  care provider. Document Released: 05/28/2008 Document Revised: 08/02/2017 Document Reviewed: 08/02/2017 Elsevier Interactive Patient Education  2019 [employer].

## 2019-02-26 ENCOUNTER — Ambulatory Visit
Admission: RE | Admit: 2019-02-26 | Discharge: 2019-02-26 | Disposition: A | Discharge status: Home / Self Care | Payer: Medicare Other | Source: Ambulatory Visit | Attending: Family Medicine | Admitting: Family Medicine

## 2019-02-26 ENCOUNTER — Telehealth: Payer: Self-pay | Admitting: Family Medicine

## 2019-02-26 DIAGNOSIS — J069 Acute upper respiratory infection, unspecified: Secondary | ICD-10-CM

## 2019-02-26 DIAGNOSIS — R05 Cough: Secondary | ICD-10-CM

## 2019-02-26 DIAGNOSIS — R0989 Other specified symptoms and signs involving the circulatory and respiratory systems: Secondary | ICD-10-CM | POA: Diagnosis not present

## 2019-02-26 DIAGNOSIS — R059 Cough, unspecified: Secondary | ICD-10-CM

## 2019-02-26 MED ORDER — AMOXICILLIN-POT CLAVULANATE 875-125 MG PO TABS: 1.0000 | ORAL_TABLET | Freq: Two times a day (BID) | ORAL | 0 refills | Status: DC

## 2019-02-26 MED ORDER — HYDROCODONE-HOMATROPINE 5-1.5 MG/5ML PO SYRP: 5.0000 mL | ORAL_SOLUTION | Freq: Three times a day (TID) | ORAL | 0 refills | Status: DC | PRN

## 2019-02-26 MED ORDER — BENZONATATE 100 MG PO CAPS: 100.0000 mg | ORAL_CAPSULE | Freq: Three times a day (TID) | ORAL | 0 refills | Status: DC | PRN

## 2019-02-26 MED ORDER — PREDNISONE 20 MG PO TABS: 40.0000 mg | ORAL_TABLET | Freq: Every day | ORAL | 0 refills | Status: AC

## 2019-02-26 NOTE — Telephone Encounter (Signed)
Spoke with patient and he would like to do the chest xray vs come back in to be reexamined he wants to go to [place]. He will be going this afternoon. I have placed order. Please cosign

## 2019-02-26 NOTE — Telephone Encounter (Signed)
I did co-sign, I reviewed film, I don't see pneumonia, some possible bronchitis changes - pending radiology read. I called the pt, he is still having cough w/o wheeze or SOB, he has more sinus pain and pressure, sx now ongoing for 5 weeks, does seem to have been 2 different waves of illness   With worsening sinus sx RX for augmentin for acute bacterial sinusitis, cough meds, steroid burst, restart mucinex.  F/up if any worsening or changes sx in chest/airway/breathing

## 2019-02-26 NOTE — Telephone Encounter (Signed)
Patient called in stating that he is still experiencing cough spells, head congestion, chest congestion, and pain to right ear. He states that he did try mucinex over the counter and took a whole bottle but it is no better. He also stated that you instructed him to call in if no better and you would send in an antibiotic. Please advise?

## 2019-02-26 NOTE — Telephone Encounter (Signed)
I would order CXR without seeing him, but wouldn't send in abx without the CXR or reexamination.   It could be bronchitis which needs different treatment, or rarely can develop pneumonia.  We have seen lung cancer missed because abx just get called in and the pt doesn't get reexamined or CXR - so he needs to f/up or go get CXR before I would prescribe anything. Can order CXR if he will go get it done

## 2019-03-03 DIAGNOSIS — D485 Neoplasm of uncertain behavior of skin: Secondary | ICD-10-CM | POA: Diagnosis not present

## 2019-03-03 DIAGNOSIS — L814 Other melanin hyperpigmentation: Secondary | ICD-10-CM | POA: Diagnosis not present

## 2019-03-03 DIAGNOSIS — D225 Melanocytic nevi of trunk: Secondary | ICD-10-CM | POA: Diagnosis not present

## 2019-03-03 DIAGNOSIS — L821 Other seborrheic keratosis: Secondary | ICD-10-CM | POA: Diagnosis not present

## 2019-03-05 ENCOUNTER — Other Ambulatory Visit: Payer: Self-pay

## 2019-03-05 ENCOUNTER — Encounter: Payer: Self-pay | Admitting: Family Medicine

## 2019-03-05 ENCOUNTER — Ambulatory Visit (INDEPENDENT_AMBULATORY_CARE_PROVIDER_SITE_OTHER): Payer: Medicare Other | Admitting: Family Medicine

## 2019-03-05 VITALS — BP 120/78 | HR 74 | Temp 98.6°F | Resp 16 | Ht 70.0 in | Wt 170.2 lb

## 2019-03-05 DIAGNOSIS — J209 Acute bronchitis, unspecified: Secondary | ICD-10-CM

## 2019-03-05 MED ORDER — IPRATROPIUM-ALBUTEROL 0.5-2.5 (3) MG/3ML IN SOLN
3.0000 mL | Freq: Once | RESPIRATORY_TRACT | Status: AC
  Administered 2019-03-05: 3 mL via RESPIRATORY_TRACT

## 2019-03-05 MED ORDER — GUAIFENESIN ER 600 MG PO TB12: 600.0000 mg | ORAL_TABLET | Freq: Two times a day (BID) | ORAL | 0 refills | Status: AC

## 2019-03-05 MED ORDER — PREDNISONE 20 MG PO TABS: ORAL_TABLET | ORAL | 0 refills | Status: DC

## 2019-03-05 MED ORDER — ALBUTEROL SULFATE 108 (90 BASE) MCG/ACT IN AEPB: 2.0000 | INHALATION_SPRAY | RESPIRATORY_TRACT | 1 refills | Status: DC | PRN

## 2019-03-05 NOTE — Patient Instructions (Signed)
Take mucinex and other over the counter cough medicines.   Use inhaler frequently.  If your breathing or coughing fits get worse when you either decrease the steroid dose or stop the steroids please let us know right away.  Follow up with any fever weight loss or chest pain.   Bronchospasm, Adult  Bronchospasm is when airways in the lungs get smaller. When this happens, it can be hard to breathe. You may cough. You may also make a whistling sound when you breathe (wheeze). Follow these instructions at home: Medicines  Take over-the-counter and prescription medicines only as told by your doctor.  If you need to use an inhaler or nebulizer to take your medicine, ask your doctor how to use it.  If you were given a spacer, always use it with your inhaler. Lifestyle  Change your heating and air conditioning filter. Do this at least once a month.  Try not to use fireplaces and wood stoves.  Do not  smoke. Do not  allow smoking in your home.  Try not to use things that have a strong smell, like perfume.  Get rid of pests (such as roaches and mice) and their poop.  Remove any mold from your home.  Keep your house clean. Get rid of dust.  Use cleaning products that have no smell.  Replace carpet with wood, tile, or vinyl flooring.  Use allergy-proof pillows, mattress covers, and box spring covers.  Wash bed sheets and blankets every week. Use hot water. Dry them in a dryer.  Use blankets that are made of polyester or cotton.  Wash your hands often.  Keep pets out of your bedroom.  When you exercise, try not to breathe in cold air. General instructions  Have a plan for getting medical care. Know these things: ? When to call your doctor. ? When to call local emergency services (911 in the [place] ? Where to go in an emergency.  Stay up to date on your shots (immunizations).  When you have an episode: ? Stay calm. ? Relax. ? Breathe slowly. Contact a doctor if:   Your muscles ache.  Your chest hurts.  The color of the mucus you cough up (sputum) changes from clear or white to yellow, green, gray, or bloody.  The mucus you cough up gets thicker.  You have a fever. Get help right away if:  The whistling sound gets worse, even after you take your medicines.  Your coughing gets worse.  You find it even harder to breathe.  Your chest hurts very much. Summary  Bronchospasm is when airways in the lungs get smaller.  When this happens, it can be hard to breathe. You may cough. You may also make a whistling sound when you breathe.  Stay away from things that cause you to have episodes. These include smoke or dust. This information is not intended to replace advice given to you by your health care provider. Make sure you discuss any questions you have with your health care provider. Document Released: 10/07/2009 Document Revised: 12/13/2016 Document Reviewed: 12/13/2016 Elsevier Interactive Patient Education  2019 [employer].

## 2019-03-05 NOTE — Progress Notes (Signed)
Patient ID: Kyle Garcia, male    DOB: [birth year], 68 y.o.   MRN: [id number]  PCP: Susy Frizzle, MD  Chief Complaint  Patient presents with  . Cough    Patient in with c/o cough.Seen 1 week ago is feeling some better.     Subjective:   Kyle Garcia is a 68 y.o. male, presents to clinic with CC of cough that has been ongoing with illness for several weeks, last week he contacted [place] via MyChart stating that his viral upper respiratory infection symptoms are worsening he got a chest x-ray was negative and he was treated for sinus infection and given medicine to help with cough and early bronchitis.  He states that when he started taking the steroids antibiotics for 2 to 3 days he felt almost completely better, he started those medicines 1 week ago however when he stopped the steroid medicine his cough started to get worse, he did not take any over-the-counter cough medicines and he had already stopped the Mucinex.  Over the past 2 to 3 days the cough has worsened is constant irritating, dry feels tight or different in his upper chest to his neck he has not felt any true wheezing he does not feel short of breath.  He has not had recurring fever and he denies chest pain. No inhaler use in the past, he has a light cigar smoker, no lung disease or history of asthma. His nasal symptoms and congestion are significantly improved he currently denies any headache, facial pain, sinus pressure or congestion, postnasal drip or sore throat.  Started 02/26/2019 Amoxicillin-Pot Clavulanate 875-125 MG 1 tablet Oral 2 times daily  Benzonatate 100 mg Oral 3 times daily PRN  predniSONE 40 mg Oral Daily with breakfast x 5 d    Patient Active Problem List   Diagnosis Date Noted  . GERD without esophagitis 11/14/2015  . Hyperlipidemia 11/14/2015  . Benign prostatic hyperplasia 12/24/2008     Prior to Admission medications   Medication Sig Start Date End Date Taking? Authorizing Provider  aspirin EC  81 MG tablet Take 81 mg by mouth every other day. 06/22/09  Yes [provider]  benzonatate (TESSALON) 100 MG capsule Take 1 capsule (100 mg total) by mouth 3 (three) times daily as needed for cough. 02/26/19  Yes Delsa Grana, PA-C  HYDROcodone-homatropine (HYCODAN) 5-1.5 MG/5ML syrup Take 5 mLs by mouth every 8 (eight) hours as needed for cough. 02/26/19  Yes Delsa Grana, PA-C  Multiple Vitamins-Minerals (MULTIVITAMIN WITH MINERALS) tablet Take 1 tablet by mouth. 05/19/10  Yes [provider]  omeprazole (PRILOSEC) 40 MG capsule Take 1 capsule (40 mg total) by mouth daily. 12/30/18  Yes Susy Frizzle, MD  pravastatin (PRAVACHOL) 20 MG tablet TAKE 1 TABLET DAILY AT 6 PM 02/18/19  Yes Susy Frizzle, MD     No Known Allergies   No family history on file.   Social History   Socioeconomic History  . Marital status: Married    Spouse name: Not on file  . Number of children: Not on file  . Years of education: Not on file  . Highest education level: Not on file  Occupational History  . Not on file  Social Needs  . Financial resource strain: Not on file  . Food insecurity:    Worry: Not on file    Inability: Not on file  . Transportation needs:    Medical: Not on file    Non-medical: Not on file  Tobacco Use  . Smoking status: Current Some Day Smoker    Types: Cigars  . Smokeless tobacco: Never Used  Substance and Sexual Activity  . Alcohol use: Yes    Alcohol/week: 4.0 - 7.0 standard drinks    Types: 1 - 3 Glasses of wine, 3 - 4 Cans of beer per week  . Drug use: Yes  . Sexual activity: Not on file  Lifestyle  . Physical activity:    Days per week: Not on file    Minutes per session: Not on file  . Stress: Not on file  Relationships  . Social connections:    Talks on phone: Not on file    Gets together: Not on file    Attends religious service: Not on file    Active member of club or organization: Not on file    Attends meetings of clubs or  organizations: Not on file    Relationship status: Not on file  . Intimate partner violence:    Fear of current or ex partner: Not on file    Emotionally abused: Not on file    Physically abused: Not on file    Forced sexual activity: Not on file  Other Topics Concern  . Not on file  Social History Narrative  . Not on file     Review of Systems  Constitutional: Negative.   HENT: Negative.   Eyes: Negative.   Respiratory: Negative.   Cardiovascular: Negative.   Gastrointestinal: Negative.   Endocrine: Negative.   Genitourinary: Negative.   Musculoskeletal: Negative.   Skin: Negative.   Allergic/Immunologic: Negative.   Neurological: Negative.   Hematological: Negative.   Psychiatric/Behavioral: Negative.   All other systems reviewed and are negative.      Objective:    Vitals:   03/05/19 1200  BP: 120/78  Pulse: 74  Resp: 16  Temp: 98.6 F (37 C)  TempSrc: Oral  SpO2: 99%  Weight: 170 lb 4 oz (77.2 kg)  Height: 5\' 10"  (1.778 m)      Physical Exam Vitals signs and nursing note reviewed.  Constitutional:      General: He is not in acute distress.    Appearance: Normal appearance. He is well-developed. He is not ill-appearing, toxic-appearing or diaphoretic.  HENT:     Head: Normocephalic and atraumatic.     Jaw: No trismus.     Right Ear: Tympanic membrane, ear canal and external ear normal. There is no impacted cerumen.     Left Ear: Tympanic membrane, ear canal and external ear normal. There is no impacted cerumen.     Nose: Mucosal edema present. No nasal tenderness, congestion or rhinorrhea.     Right Sinus: No maxillary sinus tenderness or frontal sinus tenderness.     Left Sinus: No maxillary sinus tenderness or frontal sinus tenderness.     Comments: Dried blood to left nare, raw erythematous mucosa to right nare    Mouth/Throat:     Mouth: Mucous membranes are moist. Mucous membranes are not pale, not dry and not cyanotic.     Pharynx: Oropharynx  is clear. Uvula midline. No oropharyngeal exudate, posterior oropharyngeal erythema or uvula swelling.     Tonsils: No tonsillar exudate or tonsillar abscesses.  Eyes:     General: Lids are normal.        Right eye: No discharge.        Left eye: No discharge.     Conjunctiva/sclera: Conjunctivae normal.     Pupils: Pupils  are equal, round, and reactive to light.  Neck:     Musculoskeletal: Normal range of motion and neck supple.     Trachea: Trachea and phonation normal. No tracheal deviation.  Cardiovascular:     Rate and Rhythm: Normal rate and regular rhythm.     Pulses: Normal pulses.          Radial pulses are 2+ on the right side and 2+ on the left side.     Heart sounds: Normal heart sounds. No murmur. No friction rub. No gallop.   Pulmonary:     Effort: Pulmonary effort is normal. No tachypnea, accessory muscle usage or respiratory distress.     Breath sounds: Normal breath sounds. No stridor. No decreased breath sounds, wheezing, rhonchi or rales.  Abdominal:     General: Bowel sounds are normal. There is no distension.     Palpations: Abdomen is soft.     Tenderness: There is no abdominal tenderness.  Musculoskeletal: Normal range of motion.  Lymphadenopathy:     Cervical: No cervical adenopathy.  Skin:    General: Skin is warm and dry.     Capillary Refill: Capillary refill takes less than 2 seconds.     Coloration: Skin is not pale.     Findings: No rash.     Nails: There is no clubbing.   Neurological:     Mental Status: He is alert and oriented to person, place, and time.     Motor: No abnormal muscle tone.     Coordination: Coordination normal.     Gait: Gait normal.  Psychiatric:        Speech: Speech normal.        Behavior: Behavior normal. Behavior is cooperative.           Assessment & Plan:    Pt with URI and cough for several weeks, he was treated last week with abx for bacterial sinusitis and cough meds and steroids for suspected bronchitis,  he returns today with coughing frequent dry cough for 2-3 days.  Suspect continued bronchitis with possible bronchospasm.  Explained that coughing for several weeks can be common, he was instructed to continue to take over-the-counter cough suppressants and Mucinex and we will restart the steroids and give him an inhaler to try.   Patient well-appearing, lungs CTA A&P, he was given a trial nebulizer here in the clinic to see if it improved his symptoms of something in his upper airways frequent coughing, he was reevaluated after the breathing treatment, stated he did not feel much of a difference but his breath sounds were improved with reexamination of his lungs -meaning I could hear movement of more air than before neb, there continued to be no rales rhonchi or wheeze.    Patient instructed to follow-up if there is any worsening symptoms when he is decreasing steroid doses or finishing steroid, encouraged to follow-up if he has any new symptoms such as chest pain, shortness of breath, fever, sweats or weight loss.    ICD-10-CM   1. Bronchitis with bronchospasm J20.9 predniSONE (DELTASONE) 20 MG tablet    Albuterol Sulfate 108 (90 Base) MCG/ACT AEPB    guaiFENesin (MUCINEX) 600 MG 12 hr tablet    ipratropium-albuterol (DUONEB) 0.5-2.5 (3) MG/3ML nebulizer solution 3 mL       Delsa Grana, PA-C 03/05/19 12:08 PM

## 2019-03-16 ENCOUNTER — Other Ambulatory Visit: Payer: Self-pay

## 2019-03-16 DIAGNOSIS — J209 Acute bronchitis, unspecified: Secondary | ICD-10-CM

## 2019-03-17 NOTE — Telephone Encounter (Signed)
No, not if he's feeling better.  I was just responding to the med refill requests

## 2019-03-17 NOTE — Telephone Encounter (Signed)
Better  

## 2019-03-17 NOTE — Telephone Encounter (Signed)
Left VM for pt to call the clinic.

## 2019-03-17 NOTE — Telephone Encounter (Signed)
Pt called back to report that the inhaler did not work. Pt states he stopped the inhaler and muscinex and the only thing that worked for him was alk cold plus and he is feeling. Please advise if you still want to do a telemedicine. 

## 2019-03-17 NOTE — Telephone Encounter (Signed)
We need to triage this guy or do a telemedicine f/up visit.  He has no hx of lung disease, he had URI/bronchitis for a few weeks, he should not need a refill of inhaler within 2 weeks, so we need to see how he is doing.     Add to the list of needs telemedicine visits  Thanks, Kristeen Miss

## 2019-03-19 ENCOUNTER — Telehealth: Payer: Self-pay

## 2019-03-19 ENCOUNTER — Other Ambulatory Visit: Payer: Self-pay

## 2019-03-19 ENCOUNTER — Ambulatory Visit (INDEPENDENT_AMBULATORY_CARE_PROVIDER_SITE_OTHER): Payer: Medicare Other | Admitting: Family Medicine

## 2019-03-19 ENCOUNTER — Encounter: Payer: Self-pay | Admitting: Family Medicine

## 2019-03-19 DIAGNOSIS — R05 Cough: Secondary | ICD-10-CM

## 2019-03-19 DIAGNOSIS — J310 Chronic rhinitis: Secondary | ICD-10-CM

## 2019-03-19 DIAGNOSIS — R059 Cough, unspecified: Secondary | ICD-10-CM

## 2019-03-19 MED ORDER — HYDROCODONE-HOMATROPINE 5-1.5 MG/5ML PO SYRP: 5.0000 mL | ORAL_SOLUTION | Freq: Three times a day (TID) | ORAL | 0 refills | Status: DC | PRN

## 2019-03-19 NOTE — Patient Instructions (Addendum)
Treat nasal symptoms -  flonase daily (or other over the counter nasal steroid spray - 2 sprays each nostril daily) Add antihistamine daily (ie: claritin, zytec, xyzal or allegra) For bleeding and scabbing  - nasal saline spray 1-2x a day, humidifier and/or netty pot   GERD can cause chronic coughing - take your omeprazole daily in the morning on an empty stomach for the next 2 weeks at least to treat and rule this out  Cough syrup sent through again.  You can try mucinex again - or delsym or robitussin - but I WOULD NOT USE MUCINEX ONLY, I would definitely focus on treating the nasal symptoms, allergies and post-nasal drip that is probably keeping the mucous and congestion present.  If this does not help over the next week, please call us and we will want to recheck you in office.  If anything worsens - new chest pain, shortness of breath, night sweats, weightloss, coughing up blood that is more than just a streak or tinge - call us immediately and we will assess over the phone and make a immediate plan to address.  Can utilize oncall provider for this as well when clinic is closed.  Please feel free to reach out to me or you PCP via mychart messages, however please be aware they will not alert [place], and they will be reviewed routinely, usually within 24 hours during business hours.

## 2019-03-19 NOTE — Telephone Encounter (Signed)
Please set him up for telemed/telephone visit if he agrees.  Thanks

## 2019-03-19 NOTE — Telephone Encounter (Signed)
Pt called to report that he has had a cough lasting 8 weeks and it is getting worst. Pt states that he has taken the meds Rx with little to no relief,and he has taken OTC with no relief. Pt also states that he has taken 3-4 boxes of Alk, 2-3 boxes of muscinex, and a whole bottle of muscinex. Please advise.

## 2019-03-19 NOTE — Progress Notes (Signed)
Virtual Visit via Telephone Note Telemedicine/phone encounter with Kyle Garcia arranged for 03/19/19 at 11:00 AM EDT by telephone and verified that I am speaking with the correct person using two identifiers.   I discussed the limitations, risks, security and privacy concerns of performing an evaluation and management service by telephone and the availability of in person appointments. I also discussed with the patient that there may be a patient responsible charge related to this service. The patient expressed understanding and agreed to proceed.   Services provided today were via telemedicine through telephone call.  Patient reported their location during encounter was at home.  Patient consented to telephone visit  I conducted telephone visit from [facility]  Referring Provider:   Susy Frizzle, MD   Start of phone call:  12:16 PM  All participants in encounter:  Myself and pt  History of Present Illness: Cough had gotten very very mild up until about two days ago he thought it was better.  WE even reached out to him with a inhaler refill request and he said he was finally better.  The next day however his cough worsened again, particularly in the morning, more productive x 2 mornings.     Took mucinex, and tablets, prescription cough syrup Some mornings getting worse and since stopping mucinex some    No allergy meds, no antihistamines, no steroid nasal sprays, some nasal discharge and   Steroids were done one week ago, did well until last 2 days.    3-4 lbs appetite     Observations/Objective: occasional coughing during telephone encounter, otherwise able to speak in complete sentences.  No audible wheeze or stridor.   Objective findings limited by telephone encounter.   Assessment and Plan:     ICD-10-CM   1. Cough R05    Waxing and waning x 8 weeks, no hx of same   8 weeks cough after URI, waxing and waning.  Described as  "coughing fits" past visits and physical exam very much seemed like bronchospasms.  Steroids help temporarily, but sx return after d/c, inhaler has no effect.  He had negative CXR 02/26/2019.  Multiple boxes and bottles of mucinex have not helped for nearly 2 months. Today describes scant productive sputum in am, sometimes blood tinged, started to worsen about 2 days ago after stopping mucinex, nasal discharge is also streaked scantly with blood.  Not coughing up blood clots, no CP, SOB, wheeze, fever, chills, night sweats.  Today with telephone visit and with last PE in office I suspect it is most likely secondary to post nasal drip/rhinitis, possible GERD as well Plan to refill cough syrup as this is the only thing he says that works for him. Tx nasal sx aggressively Also take omeprazole daily to tx and to r/o GERD.      2. Rhinitis, unspecified type J31.0    flonase daily, add antihistamine daily (ie: claritin, zytec, xyzal or allegra), nasal saline spray 1-2x a day     Follow Up Instructions:    I discussed the assessment and treatment plan with the patient. The patient was provided an opportunity to ask questions and all were answered. The patient agreed with the plan and demonstrated an understanding of the instructions.   The patient was advised to call back or seek an in-person evaluation if the symptoms worsen or if the condition fails to improve as anticipated.  Phone call concluded @ 12:32 PM   I provided 16 minutes of non-face-to-face  time during this encounter.   Delsa Grana, PA-C

## 2019-03-30 ENCOUNTER — Other Ambulatory Visit: Payer: Self-pay

## 2019-03-30 ENCOUNTER — Telehealth: Payer: Self-pay

## 2019-03-30 ENCOUNTER — Ambulatory Visit (INDEPENDENT_AMBULATORY_CARE_PROVIDER_SITE_OTHER): Payer: Medicare Other | Admitting: Family Medicine

## 2019-03-30 ENCOUNTER — Other Ambulatory Visit: Payer: Self-pay | Admitting: Family Medicine

## 2019-03-30 DIAGNOSIS — R059 Cough, unspecified: Secondary | ICD-10-CM

## 2019-03-30 DIAGNOSIS — R05 Cough: Secondary | ICD-10-CM

## 2019-03-30 DIAGNOSIS — R509 Fever, unspecified: Secondary | ICD-10-CM

## 2019-03-30 MED ORDER — AZITHROMYCIN 250 MG PO TABS: ORAL_TABLET | ORAL | 0 refills | Status: DC

## 2019-03-30 MED ORDER — TRAMADOL HCL 50 MG PO TABS: 50.0000 mg | ORAL_TABLET | Freq: Three times a day (TID) | ORAL | 0 refills | Status: DC | PRN

## 2019-03-30 NOTE — Telephone Encounter (Signed)
Done

## 2019-03-30 NOTE — Telephone Encounter (Signed)
Pt called to report that he is coughing/choking, fever, no SOB. Pt states that he was seen by [facility] for this condition and it has gotten worst. Cough has lasted x3 months and fever developed in the last 3-4 days. On 04/05 fever was 100.2 and states that he wakes [place] sweating when fever breaks. Pt is using benedryl and nose spray. [place] muscles have occurred due to coughing. Pt would like to know what the next steps are. Please advise.

## 2019-03-30 NOTE — Progress Notes (Signed)
Subjective:    Patient ID: Kyle Garcia, male    DOB: 26-Sep-1951, 68 y.o.   MRN: [id number]  HPI  Patient is seen today as a telephone visit.  Phone call began at 120.  Phone call concluded at 146.  Patient consents to be seen over the telephone.  He is currently at home.  I am currently at my office. Patient has had a cough now for 3 months.  Patient says the cough originally developed at the very first part of February.  Cough gradually worsened over 3 weeks at which point he came in to see my partner in the end of February and was diagnosed with bronchitis.  He was given symptomatic treatment as well as a chest x-ray that was normal.  Afterward the cough only worsened at which point he was treated for "bronchial pneumonia".  He was given Augmentin, prednisone, and albuterol inhaler along with Tessalon Perles.  The Tessalon Perles seem to help the cough only while he took them and the cough persisted.  He was then seen at the end of March and was given Hycodan and around the steroids again but with no improvement in the cough.  He is also been treated for laryngo-esophageal reflux with omeprazole which he is taken every day for the last 2 weeks without any improvement in the cough.  He is also on Flonase as well as an over-the-counter antihistamine for possible postnasal drip/upper airway cough syndrome.  None of these measures have helped the cough.  The patient states that the cough comes and goes in spasms.  When a spasm of coughing will start, the patient is unable to stop it.  He will call so much, so frequently, and so forcefully, that the muscles in his ribs and his back will hurt.  Aside from the terrible coughing fits, he denies any "sickness".  However this weekend, the patient developed a new fever.  On Friday was 99.8.  On Saturday it was 100.2.  He had a similar fever greater than 100 on Sunday.  He denies any shortness of breath.  He denies any chest pain.  He denies any pleurisy.  He denies  any hemoptysis.  Denies any potential exposure to coronavirus that he is aware of.  His wife who stays with him is not ill.  He denies any travel or exposure to TB.  Fevers do typically occur at night. Past Medical History:  Diagnosis Date   GERD (gastroesophageal reflux disease)    Hyperlipidemia    Past Surgical History:  Procedure Laterality Date   CATARACT EXTRACTION  2016   EYE SURGERY     Current Outpatient Medications on File Prior to Visit  Medication Sig Dispense Refill   aspirin EC 81 MG tablet Take 81 mg by mouth every other day.     benzonatate (TESSALON) 100 MG capsule Take 1 capsule (100 mg total) by mouth 3 (three) times daily as needed for cough. 30 capsule 0   HYDROcodone-homatropine (HYCODAN) 5-1.5 MG/5ML syrup Take 5 mLs by mouth every 8 (eight) hours as needed for cough. 120 mL 0   Multiple Vitamins-Minerals (MULTIVITAMIN WITH MINERALS) tablet Take 1 tablet by mouth.     omeprazole (PRILOSEC) 40 MG capsule Take 1 capsule (40 mg total) by mouth daily. 90 capsule 3   pravastatin (PRAVACHOL) 20 MG tablet TAKE 1 TABLET DAILY AT 6 PM 90 tablet 1   No current facility-administered medications on file prior to visit.    No Known Allergies  Social History   Socioeconomic History   Marital status: Married    Spouse name: Not on file   Number of children: Not on file   Years of education: Not on file   Highest education level: Not on file  Occupational History   Not on file  Social Needs   Financial resource strain: Not on file   Food insecurity:    Worry: Not on file    Inability: Not on file   Transportation needs:    Medical: Not on file    Non-medical: Not on file  Tobacco Use   Smoking status: Current Some Day Smoker    Types: Cigars   Smokeless tobacco: Never Used  Substance and Sexual Activity   Alcohol use: Yes    Alcohol/week: 4.0 - 7.0 standard drinks    Types: 1 - 3 Glasses of wine, 3 - 4 Cans of beer per week   Drug use: Yes     Sexual activity: Not on file  Lifestyle   Physical activity:    Days per week: Not on file    Minutes per session: Not on file   Stress: Not on file  Relationships   Social connections:    Talks on phone: Not on file    Gets together: Not on file    Attends religious service: Not on file    Active member of club or organization: Not on file    Attends meetings of clubs or organizations: Not on file    Relationship status: Not on file   Intimate partner violence:    Fear of current or ex partner: Not on file    Emotionally abused: Not on file    Physically abused: Not on file    Forced sexual activity: Not on file  Other Topics Concern   Not on file  Social History Narrative   Not on file      Review of Systems  All other systems reviewed and are negative.      Objective:   Physical Exam  No physical exam could be completed as this was a phone visit      Assessment & Plan:  Cough  Patient has had a chronic cough now approaching 3 months.  I do not believe this represents underlying coronavirus infection given the duration of symptoms.  Given the violent spasms of coughing, whooping cough is on the differential diagnosis.  Therefore I will treat the patient with a Z-Pak to cover against possible pertussis.  I will also start the patient on tramadol 50 mg every 8 hours as needed for cough for possible nerve irritation in the throat leading to coughing spasms.  Reassess the patient in 1 week.  If coughing persists, would proceed with high-resolution CT scan of the chest to evaluate for underlying lung disease and possible pulmonary consultation

## 2019-03-30 NOTE — Telephone Encounter (Signed)
Put him in with a phone visit with me at 230 please

## 2019-04-06 ENCOUNTER — Telehealth: Payer: Self-pay | Admitting: Family Medicine

## 2019-04-06 DIAGNOSIS — R05 Cough: Secondary | ICD-10-CM

## 2019-04-06 DIAGNOSIS — R053 Chronic cough: Secondary | ICD-10-CM

## 2019-04-06 NOTE — Telephone Encounter (Signed)
Pt called and states that he was rx'd zpac and tramadol for his cough he has had fo 3 months. He states that he got better while on Zpac but once he finished it the cough is back. He wanted to know what the next step would be?

## 2019-04-06 NOTE — Telephone Encounter (Signed)
Pulmonary consult would be the next step.

## 2019-04-07 NOTE — Telephone Encounter (Signed)
Pt aware and referral place

## 2019-04-07 NOTE — Telephone Encounter (Signed)
Patient calling in regards to this message

## 2019-04-09 ENCOUNTER — Telehealth: Payer: Self-pay | Admitting: General Practice

## 2019-04-10 ENCOUNTER — Other Ambulatory Visit: Payer: Self-pay

## 2019-04-10 ENCOUNTER — Ambulatory Visit (INDEPENDENT_AMBULATORY_CARE_PROVIDER_SITE_OTHER): Payer: Medicare Other | Admitting: Internal Medicine

## 2019-04-10 ENCOUNTER — Encounter: Payer: Self-pay | Admitting: Internal Medicine

## 2019-04-10 VITALS — BP 134/76 | HR 101 | Ht 70.0 in | Wt 166.4 lb

## 2019-04-10 DIAGNOSIS — R053 Chronic cough: Secondary | ICD-10-CM

## 2019-04-10 DIAGNOSIS — R05 Cough: Secondary | ICD-10-CM

## 2019-04-10 DIAGNOSIS — J690 Pneumonitis due to inhalation of food and vomit: Secondary | ICD-10-CM | POA: Insufficient documentation

## 2019-04-10 DIAGNOSIS — K219 Gastro-esophageal reflux disease without esophagitis: Secondary | ICD-10-CM | POA: Diagnosis not present

## 2019-04-10 MED ORDER — TIOTROPIUM BROMIDE-OLODATEROL 2.5-2.5 MCG/ACT IN AERS: 2.0000 | INHALATION_SPRAY | Freq: Every day | RESPIRATORY_TRACT | 0 refills | Status: DC

## 2019-04-10 MED ORDER — METHYLPREDNISOLONE ACETATE 80 MG/ML IJ SUSP
80.0000 mg | Freq: Once | INTRAMUSCULAR | Status: AC
  Administered 2019-04-10: 80 mg via INTRAMUSCULAR

## 2019-04-10 NOTE — Progress Notes (Signed)
04/10/2019- 68 yo M, occ cigar smoker for persistent cough Medical problem list includes GERD, BPH, Hyperlipidemia Stopped cigarettes in 1981 Tramadol, Omeprazole Problem cough is new to him, starting in early February, 2020. Initially occasional, with associated symptoms suggestive of sinusitis to him. CXR apparently showed a bronchopneumonia per his PCP- report not in system. Treated with amoxacillin then Zpak with some improvement, but relapsed off Rx. Used Hycodan and benzonatate, otcs. Tried irregular use of flonase and omeprazole. 2 rounds of prednisone may have helped temporarily. Not wheezing. Sinus symptoms now gone. Denies PNdrip or reflux symptoms.Very hard coughing spells worse in the evening and disturb sleep. Not related to meals or swallowing. Scant clear mucus. Few night sweats in last few days.  Occasional heart burn with spicy foods. 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 Father COPD but smoked.   CXR 02/26/2019-  FINDINGS: Cardiac silhouette is normal in size. Normal mediastinal and hilar contours. Lungs are mildly hyperexpanded, but clear. No pleural effusion or pneumothorax. Skeletal structures are intact. IMPRESSION: No active cardiopulmonary disease.  Prior to Admission medications   Medication Sig Start Date End Date Taking? Authorizing Provider  aspirin EC 81 MG tablet Take 81 mg by mouth every other day. 06/22/09  Yes [provider]  Multiple Vitamins-Minerals (MULTIVITAMIN WITH MINERALS) tablet Take 1 tablet by mouth. 05/19/10  Yes [provider]  omeprazole (PRILOSEC) 40 MG capsule Take 1 capsule (40 mg total) by mouth daily. 12/30/18  Yes Susy Frizzle, MD  pravastatin (PRAVACHOL) 20 MG tablet TAKE 1 TABLET DAILY AT 6 PM 02/18/19  Yes Susy Frizzle, MD  Tiotropium Bromide-Olodaterol (STIOLTO RESPIMAT) 2.5-2.5 MCG/ACT AERS Inhale 2 puffs into the lungs daily. 04/10/19   Deneise Lever, MD   traMADol (ULTRAM) 50 MG tablet Take 1 tablet (50 mg total) by mouth every 8 (eight) hours as needed. Patient not taking: Reported on 04/10/2019 03/30/19   Susy Frizzle, MD   Past Medical History:  Diagnosis Date  . GERD (gastroesophageal reflux disease)   . Hyperlipidemia    Past Surgical History:  Procedure Laterality Date  . CATARACT EXTRACTION  2016  . EYE SURGERY     History reviewed. No pertinent family history. Social History   Socioeconomic History  . Marital status: Married    Spouse name: Not on file  . Number of children: Not on file  . Years of education: Not on file  . Highest education level: Not on file  Occupational History  . Not on file  Social Needs  . Financial resource strain: Not on file  . Food insecurity:    Worry: Not on file    Inability: Not on file  . Transportation needs:    Medical: Not on file    Non-medical: Not on file  Tobacco Use  . Smoking status: Current Some Day Smoker    Types: Cigars  . Smokeless tobacco: Never Used  . Tobacco comment: occassional cigar/stopped ciggs in 1981  Substance and Sexual Activity  . Alcohol use: Yes    Alcohol/week: 4.0 - 7.0 standard drinks    Types: 1 - 3 Glasses of wine, 3 - 4 Cans of beer per week  . Drug use: Yes  . Sexual activity: Not on file  Lifestyle  . Physical activity:    Days per week: Not on file    Minutes per session: Not on file  . Stress: Not on file  Relationships  . Social connections:  Talks on phone: Not on file    Gets together: Not on file    Attends religious service: Not on file    Active member of club or organization: Not on file    Attends meetings of clubs or organizations: Not on file    Relationship status: Not on file  . Intimate partner violence:    Fear of current or ex partner: Not on file    Emotionally abused: Not on file    Physically abused: Not on file    Forced sexual activity: Not on file  Other Topics Concern  . Not on file  Social History  Narrative  . Not on file   ROS-see HPI   + = positive Constitutional:    weight loss, +night sweats, fevers, chills, fatigue, lassitude. HEENT:    headaches, difficulty swallowing, tooth/dental problems, sore throat,       sneezing, itching, ear ache, nasal congestion, post nasal drip, snoring CV:    chest pain, orthopnea, PND, swelling in lower extremities, anasarca,                                  dizziness, palpitations Resp:   shortness of breath with exertion or at rest.                +productive cough,   +non-productive cough, coughing up of blood.              change in color of mucus.  wheezing.   Skin:    rash or lesions. GI:  No-   heartburn, indigestion, abdominal pain, nausea, vomiting, diarrhea,                 change in bowel habits, loss of appetite GU: dysuria, change in color of urine, no urgency or frequency.   flank pain. MS:   joint pain, stiffness, decreased range of motion, back pain. Neuro-     nothing unusual Psych:  change in mood or affect.  depression or anxiety.   memory loss.  OBJ- Physical Exam General- Alert, Oriented, Affect-appropriate, Distress- none acute Skin- rash-none, lesions- none, excoriation- none Lymphadenopathy- none Head- atraumatic            Eyes- Gross vision intact, PERRLA, conjunctivae and secretions clear            Ears- Hearing, canals-normal            Nose- Clear, no-Septal dev, mucus, polyps, erosion, perforation             Throat- Mallampati II-III , mucosa clear , drainage- none, tonsils- atrophic Neck- flexible , trachea midline, no stridor , thyroid nl, carotid no bruit Chest - symmetrical excursion , unlabored           Heart/CV- RRR , no murmur , no gallop  , no rub, nl s1 s2                           - JVD- none , edema- none, stasis changes- none, varices- none           Lung- clear to P&A, wheeze- none, cough+ dry , dullness-none, rub- none           Chest wall-  Abd-  Br/ Gen/ Rectal- Not done, not  indicated Extrem- cyanosis- none, clubbing, none, atrophy- none, strength- nl Neuro- grossly intact to observation

## 2019-04-10 NOTE — Patient Instructions (Addendum)
Order- Depo 19    Dx chronic cough  Recommend: Omeprazole 40 mg twice daily- before breakfast and supper Otc ClorTriMeton antihistamine ( may make you drowsy) morning and mid-afternoon Nyquil (green top) at bedtime Flonase (futicasone)  2 puffs each nostril at bedtime  Ok to use menthol-free throat lozenges, sips of liquids, otc cough syrups like Delsym Avoid peppermint  Sample Stiolto inhaler   Inhale 2 puffs, once daily  Stay away from those cigars for now  Please call as needed

## 2019-04-10 NOTE — Assessment & Plan Note (Addendum)
By his report there may have been some bronchopneumonia on CXR in February, and he had headache and frontal pressure similar to symptoms of sinusitis in the past.  An initial infection would be consistent with symptoms and partial response to antibiotic.His PCP considered  pertussis, which is possible and could explain lingering cough. I doubt residual infection now, but airway irritability is established. Plan- begin cough protocol with treatments directed at nasopharynx and silent reflux, Stiolto inhaler sample and depomedrol. Reassess based on course. Antihistamines utilizing anticholinergic and sedative effects.

## 2019-04-10 NOTE — Assessment & Plan Note (Signed)
GERD is sometimes a silent contributor to persistent cough. We discussed antireflux measures and a sustained trial of PPI as we seek to calm cough receptors.

## 2019-04-10 NOTE — Telephone Encounter (Signed)
Seems like encounter was open in error so closing encounter.  

## 2019-04-13 ENCOUNTER — Ambulatory Visit (INDEPENDENT_AMBULATORY_CARE_PROVIDER_SITE_OTHER): Payer: Medicare Other

## 2019-04-13 ENCOUNTER — Other Ambulatory Visit: Payer: Self-pay

## 2019-04-13 ENCOUNTER — Other Ambulatory Visit (INDEPENDENT_AMBULATORY_CARE_PROVIDER_SITE_OTHER): Payer: Medicare Other

## 2019-04-13 ENCOUNTER — Telehealth: Payer: Self-pay | Admitting: Internal Medicine

## 2019-04-13 ENCOUNTER — Other Ambulatory Visit: Payer: Self-pay | Admitting: Internal Medicine

## 2019-04-13 ENCOUNTER — Telehealth: Payer: Self-pay

## 2019-04-13 DIAGNOSIS — R053 Chronic cough: Secondary | ICD-10-CM

## 2019-04-13 DIAGNOSIS — Z20828 Contact with and (suspected) exposure to other viral communicable diseases: Secondary | ICD-10-CM | POA: Diagnosis not present

## 2019-04-13 DIAGNOSIS — Z201 Contact with and (suspected) exposure to tuberculosis: Secondary | ICD-10-CM

## 2019-04-13 DIAGNOSIS — R05 Cough: Secondary | ICD-10-CM

## 2019-04-13 DIAGNOSIS — R0600 Dyspnea, unspecified: Secondary | ICD-10-CM | POA: Diagnosis not present

## 2019-04-13 DIAGNOSIS — Z20822 Contact with and (suspected) exposure to covid-19: Secondary | ICD-10-CM

## 2019-04-13 DIAGNOSIS — J690 Pneumonitis due to inhalation of food and vomit: Secondary | ICD-10-CM

## 2019-04-13 LAB — CBC WITH DIFFERENTIAL/PLATELET
Basophils Absolute: 0.1 10*3/uL (ref 0.0–0.1)
Basophils Relative: 0.8 % (ref 0.0–3.0)
Eosinophils Absolute: 0.1 10*3/uL (ref 0.0–0.7)
Eosinophils Relative: 0.8 % (ref 0.0–5.0)
HCT: 43.6 % (ref 39.0–52.0)
Hemoglobin: 14.7 g/dL (ref 13.0–17.0)
Lymphocytes Relative: 11 % — ABNORMAL LOW (ref 12.0–46.0)
Lymphs Abs: 1 10*3/uL (ref 0.7–4.0)
MCHC: 33.7 g/dL (ref 30.0–36.0)
MCV: 86.1 fl (ref 78.0–100.0)
Monocytes Absolute: 0.8 10*3/uL (ref 0.1–1.0)
Monocytes Relative: 9 % (ref 3.0–12.0)
Neutro Abs: 6.8 10*3/uL (ref 1.4–7.7)
Neutrophils Relative %: 78.4 % — ABNORMAL HIGH (ref 43.0–77.0)
Platelets: 215 10*3/uL (ref 150.0–400.0)
RBC: 5.06 Mil/uL (ref 4.22–5.81)
RDW: 13.2 % (ref 11.5–15.5)
WBC: 8.7 10*3/uL (ref 4.0–10.5)

## 2019-04-13 LAB — SEDIMENTATION RATE: Sed Rate: 18 mm/hr (ref 0–20)

## 2019-04-13 MED ORDER — LEVOFLOXACIN 750 MG PO TABS: 750.0000 mg | ORAL_TABLET | Freq: Every day | ORAL | 0 refills | Status: DC

## 2019-04-13 NOTE — Addendum Note (Signed)
Addended by: Georjean Mode on: 04/13/2019 11:08 AM   Modules accepted: Orders

## 2019-04-13 NOTE — Telephone Encounter (Signed)
Received call report from radiology  on patient's xray done on 04/13/19. CY please review the result/impression copied below:    Please advise, thank you.

## 2019-04-13 NOTE — Telephone Encounter (Signed)
Placed orders for CY today for CXR, CBC w diff, ANA, Sed Rate, D-dimer, Quantiferon Gold TB assay, Covid IgG assay For dx TB exposure, Covid exposure, Chronic cough, dyspnea Pt voiced that he is coming into office today around 12noon for CXR and labs. Nothing further needed at this time.

## 2019-04-13 NOTE — Telephone Encounter (Signed)
Primary Pulmonologist: Dr. Baird Lyons Last office visit and with whom: 04/10/2019 w/ CY What do we see them for (pulmonary problems): Chronic Cough, GERD w/o esophagitis  Reason for call: Last seen 04/10/2019 for chronic cough. Pt states he's had night sweats for approx. 3 months now, off and on for the past 3-4 days. Also states he has pain and swelling in his R lower leg from where he received his depo injection on 04/10/2019. Denies fever/chills. Denies unexplained fatigue. Has not travelled within past 30 days. Pt is concerned he may have TB, but states he has not been around anyone w/ suspected or known TB.    In the last month, have you been in contact with someone who was confirmed or suspected to have Conoravirus / COVID-19?  No  Do you have any of the following symptoms developed in the last 30 days? Fever: No Cough: Yes, chronic.  Shortness of breath: No  When did your symptoms start?  3 months ago, off and on for the past 3-4 days.  If the patient has a fever, what is the last reading?  (use n/a if patient denies fever)  N/A . IF THE PATIENT STATES THEY DO NOT OWN A THERMOMETER, THEY MUST GO AND PURCHASE ONE When did the fever start?: N/A Have you taken any medication to suppress a fever (ie Ibuprofen, Aleve, Tylenol)?: N/A  Current Outpatient Medications on File Prior to Visit  Medication Sig Dispense Refill  . aspirin EC 81 MG tablet Take 81 mg by mouth every other day.    . Multiple Vitamins-Minerals (MULTIVITAMIN WITH MINERALS) tablet Take 1 tablet by mouth.    Marland Kitchen omeprazole (PRILOSEC) 40 MG capsule Take 1 capsule (40 mg total) by mouth daily. 90 capsule 3  . pravastatin (PRAVACHOL) 20 MG tablet TAKE 1 TABLET DAILY AT 6 PM 90 tablet 1  . Tiotropium Bromide-Olodaterol (STIOLTO RESPIMAT) 2.5-2.5 MCG/ACT AERS Inhale 2 puffs into the lungs daily. 1 Inhaler 0  . traMADol (ULTRAM) 50 MG tablet Take 1 tablet (50 mg total) by mouth every 8 (eight) hours as needed. (Patient not  taking: Reported on 04/10/2019) 30 tablet 0   No current facility-administered medications on file prior to visit.    No Known Allergies  CY please advise on the recommendations you have for this patient. Thank you.

## 2019-04-13 NOTE — Telephone Encounter (Signed)
I called him back. He has had dry cough x 3 months. Cough and recent significant night sweats have been worse in the past week (I saw him 4/17). His grandfather had TB and he asks about it because of night sweats. He got depo inj R hip on 4/17. Since 4/18 he has noted pain in R lower calf/ ankle and asks if they might be related. Does not describe sciatic radiation, but asks possibility of clot since he has been less active with quarantine. Not aware of actual exposures to TB or Covid Plan- Order CXR, CBC w diff, ANA, Sed Rate, D-dimer, Quantiferon Gold TB assay, Covid IgG assay For dx TB exposure, Covid exposure, Chronic cough

## 2019-04-13 NOTE — Telephone Encounter (Signed)
I reviewed labs available so far- D-dimer normal. He is to elevate sore ankle and treat with heat/ ice. CBC- no leukocytosis. Sed rate not elevated. CXR- interstitial infiltrate RLL sup segment. In retrospect, on my review of comparison with March CXR, that area was similar but less intense. It wasn't called then by radiology, but might be the same process. Consider viral pneumonia, aspiration, neoplasia, etc. We will want to repeat CXR in 3 weeks. Plan: order 1) Order outpatient CXR in 3 weeks   Dx RLL pneumonia 2) Script has been e-sent for Levaquin

## 2019-04-13 NOTE — Addendum Note (Signed)
Addended by: Georjean Mode on: 04/13/2019 05:00 PM   Modules accepted: Orders

## 2019-04-13 NOTE — Telephone Encounter (Signed)
Called and spoke with patient to advise that CY wants him to have another CXR on 04/28/2019 Placed order for CXR today Pt expressed and verbalized understanding Advised that he will walk into office just for cxr on 04/28/19 Advised that we will call him with the cxr results Nothing further needed at this time.

## 2019-04-14 ENCOUNTER — Telehealth: Payer: Self-pay | Admitting: Internal Medicine

## 2019-04-14 ENCOUNTER — Other Ambulatory Visit (INDEPENDENT_AMBULATORY_CARE_PROVIDER_SITE_OTHER): Payer: Medicare Other

## 2019-04-14 ENCOUNTER — Ambulatory Visit (HOSPITAL_COMMUNITY)
Admission: RE | Admit: 2019-04-14 | Discharge: 2019-04-14 | Disposition: A | Discharge status: Home / Self Care | Payer: Medicare Other | Source: Ambulatory Visit | Attending: Internal Medicine | Admitting: Internal Medicine

## 2019-04-14 ENCOUNTER — Other Ambulatory Visit: Payer: Self-pay

## 2019-04-14 DIAGNOSIS — R7989 Other specified abnormal findings of blood chemistry: Secondary | ICD-10-CM | POA: Insufficient documentation

## 2019-04-14 DIAGNOSIS — R590 Localized enlarged lymph nodes: Secondary | ICD-10-CM | POA: Diagnosis not present

## 2019-04-14 DIAGNOSIS — R918 Other nonspecific abnormal finding of lung field: Secondary | ICD-10-CM | POA: Diagnosis not present

## 2019-04-14 LAB — BASIC METABOLIC PANEL
BUN: 18 mg/dL (ref 6–23)
CO2: 28 mEq/L (ref 19–32)
Calcium: 9.1 mg/dL (ref 8.4–10.5)
Chloride: 101 mEq/L (ref 96–112)
Creatinine, Ser: 1.01 mg/dL (ref 0.40–1.50)
GFR: 73.62 mL/min (ref >60.00)
Glucose, Bld: 100 mg/dL — ABNORMAL HIGH (ref 70–99)
Potassium: 3.9 mEq/L (ref 3.5–5.1)
Sodium: 138 mEq/L (ref 135–145)

## 2019-04-14 MED ORDER — IOHEXOL 350 MG/ML SOLN
100.0000 mL | Freq: Once | INTRAVENOUS | Status: AC | PRN
  Administered 2019-04-14: 16:00:00 100 mL via INTRAVENOUS

## 2019-04-14 MED ORDER — IOHEXOL 300 MG/ML  SOLN: 100.0000 mL | Freq: Once | INTRAMUSCULAR | Status: DC | PRN

## 2019-04-14 MED ORDER — HYDROCODONE-HOMATROPINE 5-1.5 MG/5ML PO SYRP: 5.0000 mL | ORAL_SOLUTION | Freq: Four times a day (QID) | ORAL | 0 refills | Status: DC | PRN

## 2019-04-14 MED ORDER — SODIUM CHLORIDE (PF) 0.9 % IJ SOLN
INTRAMUSCULAR | Status: AC
  Filled 2019-04-14: qty 50

## 2019-04-14 NOTE — Telephone Encounter (Signed)
Coughing scant white, night sweats, weight loss 14 lbs.  Lower leg still hurts. CT was neg for PE but showed pneumonia Sup Seg RLL, and RML. He continues to take Levaquin. Needs more help with exhausting cough. This may still be an aspiration pneumonia. Covid is not off the table. Sats have been good. Plan- I will order hycodan tonight.           I will call tomorrow for update and leaning toward asking leg vein dopplers to get clot issue resolved, and Covid swab to get that issue resolved.           He will elevate leg, take his BASA daily instead of QOD. Ok to reduce omeprazole to 40 mg once daily, but reflux precautions.

## 2019-04-14 NOTE — Telephone Encounter (Signed)
Spoke with Lovena Le and she stated the pt is back getting scanned so [place] has everything she needs. Nothing further is needed.

## 2019-04-14 NOTE — Telephone Encounter (Signed)
Called and spoke with pt based on the conversation pt had with CY and stated to him that CY. Stated to him that we needed him to come into office for labwork due to renal function and stated we are ordering the CTA to be done ASAP due to elevated d-dimer. Pt expressed understanding. Orders have been placed and pt is on his way to office to get labwork done. Stated to pt [facility] will be calling him to get him scheduled for the CTA. Nothing further needed.

## 2019-04-14 NOTE — Telephone Encounter (Signed)
I called patient with correction- D-dimer is elevated. Given his persistent cough, abnormal CXR and discomfort in R lower leg, I am ordering CTa chest for PE protocol. If negative we will have to get leg vein dopplers done if available under Covid status.   Order-  Schedule CTa chest today with contrast- PE protocol.  He will also need BMET for renal function. Kyle Garcia

## 2019-04-14 NOTE — Addendum Note (Signed)
Addended by: Lorretta Harp on: 04/14/2019 02:47 PM   Modules accepted: Orders

## 2019-04-14 NOTE — Addendum Note (Signed)
Addended by: Lorretta Harp on: 04/14/2019 03:04 PM   Modules accepted: Orders

## 2019-04-15 ENCOUNTER — Emergency Department (HOSPITAL_BASED_OUTPATIENT_CLINIC_OR_DEPARTMENT_OTHER): Payer: Medicare Other

## 2019-04-15 ENCOUNTER — Emergency Department (HOSPITAL_COMMUNITY): Payer: Medicare Other

## 2019-04-15 ENCOUNTER — Emergency Department (HOSPITAL_COMMUNITY)
Admission: EM | Admit: 2019-04-15 | Discharge: 2019-04-15 | Disposition: A | Discharge status: Home / Self Care | Payer: Medicare Other | Attending: Emergency Medicine | Admitting: Emergency Medicine

## 2019-04-15 ENCOUNTER — Telehealth: Payer: Self-pay | Admitting: Internal Medicine

## 2019-04-15 ENCOUNTER — Encounter (HOSPITAL_COMMUNITY): Payer: Medicare Other

## 2019-04-15 DIAGNOSIS — R05 Cough: Secondary | ICD-10-CM | POA: Diagnosis not present

## 2019-04-15 DIAGNOSIS — J181 Lobar pneumonia, unspecified organism: Secondary | ICD-10-CM

## 2019-04-15 DIAGNOSIS — I82441 Acute embolism and thrombosis of right tibial vein: Secondary | ICD-10-CM | POA: Insufficient documentation

## 2019-04-15 DIAGNOSIS — R0602 Shortness of breath: Secondary | ICD-10-CM

## 2019-04-15 DIAGNOSIS — M7989 Other specified soft tissue disorders: Secondary | ICD-10-CM

## 2019-04-15 DIAGNOSIS — J189 Pneumonia, unspecified organism: Secondary | ICD-10-CM | POA: Insufficient documentation

## 2019-04-15 DIAGNOSIS — Z79899 Other long term (current) drug therapy: Secondary | ICD-10-CM | POA: Diagnosis not present

## 2019-04-15 DIAGNOSIS — F17290 Nicotine dependence, other tobacco product, uncomplicated: Secondary | ICD-10-CM | POA: Diagnosis not present

## 2019-04-15 DIAGNOSIS — M79609 Pain in unspecified limb: Secondary | ICD-10-CM

## 2019-04-15 LAB — QUANTIFERON-TB GOLD PLUS
Mitogen-NIL: 7.93 IU/mL
NIL: 0.03 IU/mL
QuantiFERON-TB Gold Plus: NEGATIVE
TB1-NIL: 0 IU/mL
TB2-NIL: <0 IU/mL

## 2019-04-15 LAB — CBC WITH DIFFERENTIAL/PLATELET
Abs Immature Granulocytes: 0.06 10*3/uL (ref 0.00–0.07)
Basophils Absolute: 0.1 10*3/uL (ref 0.0–0.1)
Basophils Relative: 1 %
Eosinophils Absolute: 0.1 10*3/uL (ref 0.0–0.5)
Eosinophils Relative: 1 %
HCT: 45.3 % (ref 39.0–52.0)
Hemoglobin: 14.6 g/dL (ref 13.0–17.0)
Immature Granulocytes: 1 %
Lymphocytes Relative: 12 %
Lymphs Abs: 0.8 10*3/uL (ref 0.7–4.0)
MCH: 28.8 pg (ref 26.0–34.0)
MCHC: 32.2 g/dL (ref 30.0–36.0)
MCV: 89.3 fL (ref 80.0–100.0)
Monocytes Absolute: 0.8 10*3/uL (ref 0.1–1.0)
Monocytes Relative: 11 %
Neutro Abs: 5.4 10*3/uL (ref 1.7–7.7)
Neutrophils Relative %: 74 %
Platelets: 183 10*3/uL (ref 150–400)
RBC: 5.07 MIL/uL (ref 4.22–5.81)
RDW: 12.6 % (ref 11.5–15.5)
WBC: 7.2 10*3/uL (ref 4.0–10.5)
nRBC: 0 % (ref 0.0–0.2)

## 2019-04-15 LAB — COMPREHENSIVE METABOLIC PANEL
ALT: 16 U/L (ref 0–44)
AST: 13 U/L — ABNORMAL LOW (ref 15–41)
Albumin: 3.7 g/dL (ref 3.5–5.0)
Alkaline Phosphatase: 67 U/L (ref 38–126)
Anion gap: 10 (ref 5–15)
BUN: 21 mg/dL (ref 8–23)
CO2: 25 mmol/L (ref 22–32)
Calcium: 8.5 mg/dL — ABNORMAL LOW (ref 8.9–10.3)
Chloride: 100 mmol/L (ref 98–111)
Creatinine, Ser: 0.9 mg/dL (ref 0.61–1.24)
GFR calc Af Amer: >60 mL/min (ref >60)
GFR calc non Af Amer: >60 mL/min (ref >60)
Glucose, Bld: 97 mg/dL (ref 70–99)
Potassium: 3.5 mmol/L (ref 3.5–5.1)
Sodium: 135 mmol/L (ref 135–145)
Total Bilirubin: 0.7 mg/dL (ref 0.3–1.2)
Total Protein: 7.1 g/dL (ref 6.5–8.1)

## 2019-04-15 LAB — SAR COV2 SEROLOGY (COVID19)AB(IGG),IA: SARS CoV2 AB IGG: NEGATIVE

## 2019-04-15 LAB — ANA: Anti Nuclear Antibody (ANA): POSITIVE — AB

## 2019-04-15 LAB — ANTI-NUCLEAR AB-TITER (ANA TITER)
ANA TITER: 1:40 {titer} — ABNORMAL HIGH
ANA Titer 1: 1:80 {titer} — ABNORMAL HIGH

## 2019-04-15 LAB — D-DIMER, QUANTITATIVE: D-Dimer, Quant: 22.9 mcg/mL FEU — ABNORMAL HIGH (ref <0.50)

## 2019-04-15 LAB — LACTIC ACID, PLASMA: Lactic Acid, Venous: 1 mmol/L (ref 0.5–1.9)

## 2019-04-15 MED ORDER — LACTATED RINGERS IV BOLUS
1000.0000 mL | Freq: Once | INTRAVENOUS | Status: AC
  Administered 2019-04-15: 1000 mL via INTRAVENOUS

## 2019-04-15 MED ORDER — RIVAROXABAN (XARELTO) VTE STARTER PACK (15 & 20 MG): ORAL_TABLET | ORAL | 0 refills | Status: DC

## 2019-04-15 MED ORDER — RIVAROXABAN (XARELTO) EDUCATION KIT FOR DVT/PE PATIENTS
PACK | Freq: Once | Status: AC
  Administered 2019-04-15: 16:00:00

## 2019-04-15 MED ORDER — RIVAROXABAN 15 MG PO TABS
15.0000 mg | ORAL_TABLET | Freq: Once | ORAL | Status: AC
  Administered 2019-04-15: 15 mg via ORAL
  Filled 2019-04-15: qty 1

## 2019-04-15 NOTE — Progress Notes (Signed)
Right lower extremity venous duplex completed.  Critical results discussed with Dr. Regenia Skeeter.  Refer to "CV Proc" under chart review to view preliminary results.  04/15/2019 2:56 PM Maudry Mayhew, MHA, RVT, RDCS, RDMS

## 2019-04-15 NOTE — ED Triage Notes (Addendum)
Dr. Glynn Octave (Pulm.) phones prior to his arrival to tell us that for about a week pt. Has been treated by him for RML and RLL pneumonia. Pt. Had chest CT yesterday which was negative for P.E. Today pt. Has leukocytosis and persistent right calf pain with +D Dimer. Dr. Annamaria Boots recommends to consider COVID test.

## 2019-04-15 NOTE — Telephone Encounter (Signed)
He slept better last night with hycodan. Lower calf still hurts and needs to be reconciled with his elevated D-dimer. Cough may be some better on Levaquin. I suspect his pneumonia may be aspiration, and may explain his D-dimer, but with nl WBC and current Covid concerns, I would like nasal swab for Covid. He agrees with my recommendation that he go to [facility] for these issues. I have spoken to [employer], Therapist, sports at [facility].

## 2019-04-15 NOTE — Care Management (Signed)
Contacted pt's pharmacy and they do have Xarelto starter pack in stock. Pt has a copay of $400. Provided Xarelto 30 day free trial card to pharmacy over phone. Jonnie Finner RN CCM Case Mgmt phone [phone]

## 2019-04-15 NOTE — ED Triage Notes (Signed)
Patient reports he has been having a cough for over 2 months that turned into pneumonia. PT reports he never properly recovered. Pt is concerned about Covid-19 possibility.  Patient is speaking in complete sentences and denies SOB. Pt is alert and oriented.

## 2019-04-15 NOTE — ED Provider Notes (Addendum)
[facility] DEPT Provider Note   CSN: [id number] Arrival date & time: 04/15/19  1208    History   Chief Complaint Chief Complaint  Patient presents with   Cough    HPI Kyle Garcia is a 68 y.o. male.     HPI  69 year old male presents with chief complaint of cough.  He was sent in by his pulmonologist.  The patient's been having a cough for a couple months and originally had an x-ray 1 or 2 months ago that showed questionable pneumonia.  Was referred to pulmonology for the coughing.  X-ray a couple days ago showed pneumonia and he has been on Levaquin.  He took his third dose of a 10-day course this morning.  The patient denies any fever or shortness of breath.  Has been on multiple meds for the cough and just had Hycodan called in.  No chest pain or vomiting.  He had multiple lab test performed recently that showed an elevated d-dimer and a negative coronavirus IgG.  The patient has had some calf pain which is where the d-dimer came from and now the pain in his calf is much better but he has some ankle pain and swelling.  He states that his doctor sent him in for possible coronavirus nasal swab. The day before his leg pain started he had had an IM steroid injection in the right hip.  Past Medical History:  Diagnosis Date   GERD (gastroesophageal reflux disease)    Hyperlipidemia     Patient Active Problem List   Diagnosis Date Noted   Chronic cough 04/10/2019   GERD without esophagitis 11/14/2015   Hyperlipidemia 11/14/2015   Benign prostatic hyperplasia 12/24/2008    Past Surgical History:  Procedure Laterality Date   CATARACT EXTRACTION  2016   EYE SURGERY          Home Medications    Prior to Admission medications   Medication Sig Start Date End Date Taking? Authorizing Provider  HYDROcodone-homatropine (HYCODAN) 5-1.5 MG/5ML syrup Take 5 mLs by mouth every 6 (six) hours as needed for cough. 04/14/19  Yes Young, Tarri Fuller D,  MD  levofloxacin (LEVAQUIN) 750 MG tablet Take 1 tablet (750 mg total) by mouth daily. 04/13/19  Yes Young, Tarri Fuller D, MD  Multiple Vitamins-Minerals (MULTIVITAMIN WITH MINERALS) tablet Take 1 tablet by mouth daily.  05/19/10  Yes [provider]  Multiple Vitamins-Minerals (PRESERVISION AREDS 2) CAPS Take 1 capsule by mouth 2 (two) times daily.   Yes [provider]  omeprazole (PRILOSEC) 40 MG capsule Take 1 capsule (40 mg total) by mouth daily. 12/30/18  Yes Susy Frizzle, MD  OVER THE COUNTER MEDICATION Take 10 mLs by mouth 2 (two) times daily as needed (cough/congestion). OTC Cought Syrup   Yes [provider]  polyvinyl alcohol (LIQUIFILM TEARS) 1.4 % ophthalmic solution Place 1 drop into both eyes as needed for dry eyes.   Yes [provider]  pravastatin (PRAVACHOL) 20 MG tablet TAKE 1 TABLET DAILY AT 6 PM Patient taking differently: Take 20 mg by mouth daily. TAKE 1 TABLET DAILY AT 6 PM 02/18/19  Yes Susy Frizzle, MD  Tiotropium Bromide-Olodaterol (STIOLTO RESPIMAT) 2.5-2.5 MCG/ACT AERS Inhale 2 puffs into the lungs daily. 04/10/19  Yes Young, Tarri Fuller D, MD  Rivaroxaban 15 & 20 MG TBPK Take as directed on package: Start with one 13m tablet by mouth twice a day with food. On Day 22, switch to one 241mtablet once a day with  food. 04/15/19   Sherwood Gambler, MD  traMADol (ULTRAM) 50 MG tablet Take 1 tablet (50 mg total) by mouth every 8 (eight) hours as needed. Patient not taking: Reported on 04/10/2019 03/30/19   Susy Frizzle, MD    Family History No family history on file.  Social History Social History   Tobacco Use   Smoking status: Current Some Day Smoker    Types: Cigars   Smokeless tobacco: Never Used   Tobacco comment: occassional cigar/stopped ciggs in 1981  Substance Use Topics   Alcohol use: Yes    Alcohol/week: 4.0 - 7.0 standard drinks    Types: 1 - 3 Glasses of wine, 3 - 4 Cans of beer per week   Drug use: Yes      Allergies   Patient has no known allergies.   Review of Systems Review of Systems  Constitutional: Negative for fever.  Respiratory: Positive for cough. Negative for shortness of breath.   Cardiovascular: Positive for leg swelling. Negative for chest pain.  Gastrointestinal: Negative for vomiting.  Musculoskeletal: Positive for myalgias.  All other systems reviewed and are negative.    Physical Exam Updated Vital Signs BP (!) 143/81    Pulse 96    Temp 98.3 F (36.8 C) (Oral)    Resp 15    Ht _0  (1.778 m)    Wt 69.9 kg    SpO2 100%    BMI 22.10 kg/m   Physical Exam Vitals signs and nursing note reviewed.  Constitutional:      General: He is not in acute distress.    Appearance: He is well-developed. He is not ill-appearing or diaphoretic.     Comments: Frequent cough  HENT:     Head: Normocephalic and atraumatic.     Right Ear: External ear normal.     Left Ear: External ear normal.     Nose: Nose normal.  Eyes:     General:        Right eye: No discharge.        Left eye: No discharge.  Neck:     Musculoskeletal: Neck supple.  Cardiovascular:     Rate and Rhythm: Regular rhythm. Tachycardia present.     Pulses:          Dorsalis pedis pulses are 2+ on the right side.     Comments: HR low 100s Pulmonary:     Effort: Pulmonary effort is normal. No tachypnea, accessory muscle usage or respiratory distress.     Comments: Speaks in complete sentences without difficulty Abdominal:     General: There is no distension.  Musculoskeletal:     Right ankle: He exhibits swelling. Tenderness.     Right lower leg: He exhibits no tenderness and no swelling.     Right foot: Swelling present.  Skin:    General: Skin is warm and dry.  Neurological:     Mental Status: He is alert.  Psychiatric:        Mood and Affect: Mood is not anxious.      ED Treatments / Results  Labs (all labs ordered are listed, but only abnormal results are displayed) Labs Reviewed   COMPREHENSIVE METABOLIC PANEL - Abnormal; Notable for the following components:      Result Value   Calcium 8.5 (*)    AST 13 (*)    All other components within normal limits  CULTURE, BLOOD (ROUTINE X 2)  CULTURE, BLOOD (ROUTINE X 2)  LACTIC ACID, PLASMA  CBC WITH DIFFERENTIAL/PLATELET    EKG EKG Interpretation  Date/Time:  Wednesday April 15 2019 13:11:08 EDT Ventricular Rate:  91 PR Interval:    QRS Duration: 79 QT Interval:  373 QTC Calculation: 459 R Axis:   68 Text Interpretation:  Normal sinus rhythm no acute ST/T changes No old tracing to compare Confirmed by Sherwood Gambler [phone]) on 04/15/2019 1:23:46 PM   Radiology Ct Angio Chest W/cm &/or Wo Cm  Result Date: 04/14/2019 CLINICAL DATA:  68 year old with elevated D-dimer. Cough for 3 months. Abnormal chest radiograph. EXAM: CT ANGIOGRAPHY CHEST WITH CONTRAST TECHNIQUE: Multidetector CT imaging of the chest was performed using the standard protocol during bolus administration of intravenous contrast. Multiplanar CT image reconstructions and MIPs were obtained to evaluate the vascular anatomy. CONTRAST:  <See Chart> OMNIPAQUE IOHEXOL 300 MG/ML SOLN, 168m OMNIPAQUE IOHEXOL 350 MG/ML SOLN COMPARISON:  Chest radiograph 04/13/2019 FINDINGS: Cardiovascular: Satisfactory opacification of the pulmonary arteries to the segmental level. No evidence of pulmonary embolism. Normal heart size. No pericardial effusion. Normal caliber of the thoracic aorta. Atherosclerotic calcifications involving the thoracic aorta. Great vessels are patent. Ectasias of the distal celiac trunk measuring up to 1.1 cm. Mediastinum/Nodes: Small prominent left supraclavicular lymph nodes on sequence 4, image 18. Enlarged left hilar tissue measuring 1.3 cm in the short axis on sequence 4, image 47. Soft tissue fullness in the right hilum. Mildly prominent subcarinal tissue. No axillary lymph node enlargement. Lungs/Pleura: Trachea and mainstem bronchi are patent.  No pleural effusions. Interstitial thickening and airspace disease in the superior segment of the right lower lobe and the posterior right middle lobe. Small amount of parenchymal disease in the posterior right upper lobe. Peripheral opacity in the superior segment of the right lower lobe has a small amount of cavitation. Small focus of peripheral disease in the left upper lobe on sequence 7, image 48. Small amount of peripheral disease in the superior segment of the left lower lobe. Upper Abdomen: Several low-density structures along the hepatic dome are nonspecific. Largest measures 1.6 cm and favor a benign etiology such as a hepatic cyst. No acute abnormality in upper abdomen. Musculoskeletal: No acute bone abnormality. Review of the MIP images confirms the above findings. IMPRESSION: 1. No evidence for a pulmonary embolism. 2. Interstitial and airspace disease in the right lung, particularly in the superior segment of the right lower lobe and posterior aspect of the right middle lobe. Findings are suggestive for multi lobar pneumonia. Small amount of disease in left lung. 3. Bilateral hilar lymphadenopathy with mild mediastinal lymphadenopathy. Mildly enlarged left supraclavicular lymph nodes. These lymph nodes may be reactive but recommend surveillance. The pneumonia could be followed with chest radiography and consider a follow-up chest CT with IV contrast in 3 months to evaluate the lymphadenopathy and exclude an underlying neoplastic process. 4.  Aortic Atherosclerosis (ICD10-I70.0). Electronically Signed   By: AMarkus DaftM.D.   On: 04/14/2019 17:04   Dg Chest Port 1 View  Result Date: 04/15/2019 CLINICAL DATA:  Cough, pneumonia EXAM: PORTABLE CHEST 1 VIEW COMPARISON:  CT chest 04/14/2011 FINDINGS: There is right perihilar airspace disease most concerning for pneumonia. There is no pleural effusion or pneumothorax. The heart and mediastinal contours are unremarkable. The osseous structures are  unremarkable. IMPRESSION: Right perihilar pneumonia. Electronically Signed   By: HKathreen Devoid  On: 04/15/2019 13:33   Vas UKoreaLower Extremity Venous (dvt) (only Mc & Wl 7a-7p)  Result Date: 04/15/2019  Lower Venous Study Indications: Pain, Swelling, and SOB.  Performing Technologist: Maudry Mayhew MHS, RDMS, RVT, RDCS  Examination Guidelines: A complete evaluation includes B-mode imaging, spectral Doppler, color Doppler, and power Doppler as needed of all accessible portions of each vessel. Bilateral testing is considered an integral part of a complete examination. Limited examinations for reoccurring indications may be performed as noted.  +---------+---------------+---------+-----------+----------+-------+  RIGHT     Compressibility Phasicity Spontaneity Properties Summary  +---------+---------------+---------+-----------+----------+-------+  CFV       Full            Yes       Yes                             +---------+---------------+---------+-----------+----------+-------+  SFJ       Full                                                      +---------+---------------+---------+-----------+----------+-------+  FV Prox   Full                                                      +---------+---------------+---------+-----------+----------+-------+  FV Mid    Full                                                      +---------+---------------+---------+-----------+----------+-------+  FV Distal Full                                                      +---------+---------------+---------+-----------+----------+-------+  PFV       Full                                                      +---------+---------------+---------+-----------+----------+-------+  POP       Full            Yes       Yes                             +---------+---------------+---------+-----------+----------+-------+  PTV       None                      No                     Acute     +---------+---------------+---------+-----------+----------+-------+  PERO      Full                      Yes                             +---------+---------------+---------+-----------+----------+-------+  GSV       None                                             Acute    +---------+---------------+---------+-----------+----------+-------+   +----+---------------+---------+-----------+----------+-------+  LEFT Compressibility Phasicity Spontaneity Properties Summary  +----+---------------+---------+-----------+----------+-------+  CFV  Full            Yes       Yes                             +----+---------------+---------+-----------+----------+-------+     Summary: Right: Findings consistent with acute deep vein thrombosis involving the right posterior tibial vein. Findings consistent with acute superficial vein thrombosis involving the right great saphenous vein. No cystic structure found in the popliteal fossa. Left: No evidence of common femoral vein obstruction.  *See table(s) above for measurements and observations.    Preliminary     Procedures Procedures (including critical care time)  Medications Ordered in ED Medications  lactated ringers bolus 1,000 mL (0 mLs Intravenous Stopped 04/15/19 1445)  rivaroxaban (XARELTO) Education Kit for DVT/PE patients ( Does not apply Given 04/15/19 1532)  Rivaroxaban (XARELTO) tablet 15 mg (15 mg Oral Given 04/15/19 1532)     Initial Impression / Assessment and Plan / ED Course  I have reviewed the triage vital signs and the nursing notes.  Pertinent labs & imaging results that were available during my care of the patient were reviewed by me and considered in my medical decision making (see chart for details).        Patient's cough can be explained by the abnormalities in the right lung that is most likely pneumonia.  He is currently on Levaquin and states he is actually feeling a little bit better while being on it.  I do not think he is having  treatment failure.  He was initially tachycardic though he thinks this is from the mask that he is getting used to wearing.  Now his heart rate is improving and is normal.  His labs are overall reassuring, including WBC, lactate.  Chest x-ray is not worse.  He is not short of breath, does not have hypoxia, and no increased work of breathing.  While his PCP sent him in for potential COVID testing, I see that he had IgG testing that has resulted today and was negative.  I do not think it will change his management even if he were to be COVID-19 positive as he would still be discharged.  I have asked him to quarantine himself at home, including from family.  However, given his overall well appearance and another explanation for his cough without other typical findings of COVID-19, I do not think he needs to be tested at this time.  As for his DVT ultrasound, there is a deep vein thrombosis though it is in the calf.  However given he is symptomatic with the elevated d-dimer, I think is reasonable to place him on anticoagulation.  I discussed this as well as the not always straightforward decision to treat a calf DVT.  He understands and would like to do the anticoagulation.  His HAS BLED score is a 1. He will stop his baby aspirin that he is been taking every other day for prophylaxis.  We discussed no NSAIDs.  We discussed bleeding precautions.  They had a CT yesterday that showed no PE.  Will discharge with continued antibiotics and the new Xarelto.  Kyle Garcia was evaluated in Emergency Department on 04/15/2019 for the symptoms described in the history of present illness. He was evaluated in the context of the global COVID-19 pandemic, which necessitated consideration that the patient might be at risk for infection with the SARS-CoV-2 virus that causes COVID-19. Institutional protocols and algorithms that pertain to the evaluation of patients at risk for COVID-19 are in a state of rapid change based on  information released by regulatory bodies including the CDC and federal and state organizations. These policies and algorithms were followed during the patient's care in the ED.   Final Clinical Impressions(s) / ED Diagnoses   Final diagnoses:  Pneumonia of right middle lobe due to infectious organism [facility])  Acute deep vein thrombosis (DVT) of right tibial vein [facility])    ED Discharge Orders         Ordered    Rivaroxaban 15 & 20 MG TBPK     04/15/19 1509           Sherwood Gambler, MD 04/15/19 1553    Sherwood Gambler, MD 04/15/19 1600

## 2019-04-15 NOTE — Discharge Instructions (Signed)
If you develop trouble breathing, coughing up blood, chest pain, or any other new/concerning symptoms then return to the ER for evaluation.  Otherwise follow-up with your primary care physician.  Finish your antibiotics for pneumonia.  Your ultrasound does show a deep vein thrombosis and we are starting you on anticoagulation.  You may not take aspirin or NSAIDs such as ibuprofen, Advil, Aleve, Motrin, naproxen, etc as these can increase the risk of bleeding.  ________________________________________ Information on my medicine - XARELTO (rivaroxaban)  This medication education was reviewed with me or my healthcare representative as part of my discharge preparation.    WHY WAS XARELTO PRESCRIBED FOR YOU? Xarelto was prescribed to treat blood clots that may have been found in the veins of your legs (deep vein thrombosis) or in your lungs (pulmonary embolism) and to reduce the risk of them occurring again.  What do you need to know about Xarelto? The starting dose is one 15 mg tablet taken TWICE daily with food for the FIRST 21 DAYS then on 05/07/2019  the dose is changed to one 20 mg tablet taken ONCE A DAY with your evening meal.  DO NOT stop taking Xarelto without talking to the health care provider who prescribed the medication.  Refill your prescription for 20 mg tablets before you run out.  After discharge, you should have regular check-up appointments with your healthcare provider that is prescribing your Xarelto.  In the future your dose may need to be changed if your kidney function changes by a significant amount.  What do you do if you miss a dose? If you are taking Xarelto TWICE DAILY and you miss a dose, take it as soon as you remember. You may take two 15 mg tablets (total 30 mg) at the same time then resume your regularly scheduled 15 mg twice daily the next day.  If you are taking Xarelto ONCE DAILY and you miss a dose, take it as soon as you remember on the same day then  continue your regularly scheduled once daily regimen the next day. Do not take two doses of Xarelto at the same time.   Important Safety Information Xarelto is a blood thinner medicine that can cause bleeding. You should call your healthcare provider right away if you experience any of the following: ? Bleeding from an injury or your nose that does not stop. ? Unusual colored urine (red or dark brown) or unusual colored stools (red or black). ? Unusual bruising for unknown reasons. ? A serious fall or if you hit your head (even if there is no bleeding).  Some medicines may interact with Xarelto and might increase your risk of bleeding while on Xarelto. To help avoid this, consult your healthcare provider or pharmacist prior to using any new prescription or non-prescription medications, including herbals, vitamins, non-steroidal anti-inflammatory drugs (NSAIDs) and supplements.  This website has more information on Xarelto: [email or web address]

## 2019-04-16 ENCOUNTER — Telehealth: Payer: Self-pay | Admitting: Internal Medicine

## 2019-04-16 NOTE — Telephone Encounter (Signed)
Pt calling requesting the results from tests that were performed yesterday, 04/15/2019 at [facility]. Dr. Annamaria Boots, please advise. Thanks!

## 2019-04-16 NOTE — Telephone Encounter (Signed)
Discussed with patient and answered his questions: Doppler confirmed DVT R lower leg. He is on Xarelto 15 BID, and will change to 20 daily after initial course. We discussed mobilization. Clot appears unprovoked, except he has been less active due to cough and staying home more. We will reconsider Xarelto after 30 days. ER chose not to do Covid nasal swab, since antibody test was negative and treatment wouldn't be changed.  He will continue his levaquin and hydrocodone CS. He will keep appointment for CXR and for televisit f/u. Encouraged to call as needed.

## 2019-04-20 LAB — CULTURE, BLOOD (ROUTINE X 2)
Culture: NO GROWTH
Culture: NO GROWTH

## 2019-04-27 ENCOUNTER — Ambulatory Visit (INDEPENDENT_AMBULATORY_CARE_PROVIDER_SITE_OTHER)
Admission: RE | Admit: 2019-04-27 | Discharge: 2019-04-27 | Disposition: A | Discharge status: Home / Self Care | Payer: Medicare Other | Source: Ambulatory Visit | Attending: Internal Medicine | Admitting: Internal Medicine

## 2019-04-27 ENCOUNTER — Other Ambulatory Visit: Payer: Self-pay

## 2019-04-27 DIAGNOSIS — J690 Pneumonitis due to inhalation of food and vomit: Secondary | ICD-10-CM | POA: Diagnosis not present

## 2019-04-27 DIAGNOSIS — R05 Cough: Secondary | ICD-10-CM | POA: Diagnosis not present

## 2019-04-28 ENCOUNTER — Other Ambulatory Visit: Payer: Self-pay

## 2019-04-28 ENCOUNTER — Telehealth: Payer: Self-pay | Admitting: Internal Medicine

## 2019-04-28 ENCOUNTER — Other Ambulatory Visit (INDEPENDENT_AMBULATORY_CARE_PROVIDER_SITE_OTHER): Payer: Medicare Other

## 2019-04-28 ENCOUNTER — Encounter: Payer: Self-pay | Admitting: Internal Medicine

## 2019-04-28 ENCOUNTER — Ambulatory Visit (INDEPENDENT_AMBULATORY_CARE_PROVIDER_SITE_OTHER): Payer: Medicare Other | Admitting: Internal Medicine

## 2019-04-28 VITALS — BP 118/70 | HR 89 | Temp 98.8°F | Ht 70.5 in | Wt 159.4 lb

## 2019-04-28 DIAGNOSIS — R053 Chronic cough: Secondary | ICD-10-CM

## 2019-04-28 DIAGNOSIS — J181 Lobar pneumonia, unspecified organism: Secondary | ICD-10-CM | POA: Diagnosis not present

## 2019-04-28 DIAGNOSIS — R05 Cough: Secondary | ICD-10-CM

## 2019-04-28 DIAGNOSIS — R634 Abnormal weight loss: Secondary | ICD-10-CM | POA: Diagnosis not present

## 2019-04-28 DIAGNOSIS — R591 Generalized enlarged lymph nodes: Secondary | ICD-10-CM | POA: Diagnosis not present

## 2019-04-28 DIAGNOSIS — R599 Enlarged lymph nodes, unspecified: Secondary | ICD-10-CM

## 2019-04-28 LAB — COMPREHENSIVE METABOLIC PANEL
ALT: 12 U/L (ref 0–53)
AST: 13 U/L (ref 0–37)
Albumin: 3.5 g/dL (ref 3.5–5.2)
Alkaline Phosphatase: 65 U/L (ref 39–117)
BUN: 14 mg/dL (ref 6–23)
CO2: 28 mEq/L (ref 19–32)
Calcium: 8.2 mg/dL — ABNORMAL LOW (ref 8.4–10.5)
Chloride: 100 mEq/L (ref 96–112)
Creatinine, Ser: 0.88 mg/dL (ref 0.40–1.50)
GFR: 86.29 mL/min (ref >60.00)
Glucose, Bld: 92 mg/dL (ref 70–99)
Potassium: 4 mEq/L (ref 3.5–5.1)
Sodium: 136 mEq/L (ref 135–145)
Total Bilirubin: 0.4 mg/dL (ref 0.2–1.2)
Total Protein: 6.4 g/dL (ref 6.0–8.3)

## 2019-04-28 LAB — CBC WITH DIFFERENTIAL/PLATELET
Basophils Absolute: 0 10*3/uL (ref 0.0–0.1)
Basophils Relative: 0.2 % (ref 0.0–3.0)
Eosinophils Absolute: 0.1 10*3/uL (ref 0.0–0.7)
Eosinophils Relative: 1.5 % (ref 0.0–5.0)
HCT: 39.8 % (ref 39.0–52.0)
Hemoglobin: 13.3 g/dL (ref 13.0–17.0)
Lymphocytes Relative: 15.8 % (ref 12.0–46.0)
Lymphs Abs: 1.1 10*3/uL (ref 0.7–4.0)
MCHC: 33.4 g/dL (ref 30.0–36.0)
MCV: 85.5 fl (ref 78.0–100.0)
Monocytes Absolute: 0.8 10*3/uL (ref 0.1–1.0)
Monocytes Relative: 11.5 % (ref 3.0–12.0)
Neutro Abs: 5 10*3/uL (ref 1.4–7.7)
Neutrophils Relative %: 71 % (ref 43.0–77.0)
Platelets: 245 10*3/uL (ref 150.0–400.0)
RBC: 4.66 Mil/uL (ref 4.22–5.81)
RDW: 13.5 % (ref 11.5–15.5)
WBC: 7 10*3/uL (ref 4.0–10.5)

## 2019-04-28 MED ORDER — HYDROCOD POLST-CPM POLST ER 10-8 MG/5ML PO SUER: ORAL | 0 refills | Status: DC

## 2019-04-28 NOTE — Assessment & Plan Note (Signed)
Cough is exhausting and disrupts sleep. Need to consider other possible reasons. Plan- BOOST supplement

## 2019-04-28 NOTE — Patient Instructions (Addendum)
Order- Labs- CBC w diff,  CMET     Dx Multilobar pneumonia              Schedule PET scan neck to thigh    Dx Hilar and mediastinal adenopathy  Script will be sent for Tussionex cough syrup when e-system is up again.  Ok to continue heat for right ankle, and continue Xarelto- let us know when you run out of this for refill  Change pending televisit to in-person

## 2019-04-28 NOTE — Telephone Encounter (Signed)
Pt aware.

## 2019-04-28 NOTE — Progress Notes (Signed)
HPI  M, occ cigar smoker for persistent cough, Pulm infiltrate, DVT/ Xarelto, Complicated by GERD, BPH, Hyperlipidemia Stopped cigarettes in 1981, Occ cigar  Labs- D-dimer 22.9 on 4/20     Doppler leg veins 4/22 Pos R DVT           SAR CoV2 antibody serology Neg 4/20           Sed rate 18 on 4/20            WBC 8.700 on 4/20            ANA 1:80            Quantiferon Gold   Neg 4/20  --------------------------------------------------------------------------------------- 04/10/2019- 68 yo M, occ cigar smoker for persistent cough Medical problem list includes GERD, BPH, Hyperlipidemia Stopped cigarettes in 1981 Tramadol, Omeprazole Problem cough is new to him, starting in early February, 2020. Initially occasional, with associated symptoms suggestive of sinusitis to him. CXR apparently showed a bronchopneumonia per his PCP- report not in system. Treated with amoxacillin then Zpak with some improvement, but relapsed off Rx. Used Hycodan and benzonatate, otcs. Tried irregular use of flonase and omeprazole. 2 rounds of prednisone may have helped temporarily. Not wheezing. Sinus symptoms now gone. Denies PNdrip or reflux symptoms.Very hard coughing spells worse in the evening and disturb sleep. Not related to meals or swallowing. Scant clear mucus. Few night sweats in last few days.  Occasional heart burn with spicy foods. 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 Father COPD but smoked.  CXR 02/26/2019-  FINDINGS: Cardiac silhouette is normal in size. Normal mediastinal and hilar contours. Lungs are mildly hyperexpanded, but clear. No pleural effusion or pneumothorax. Skeletal structures are intact. IMPRESSION: No active cardiopulmonary disease.   04/28/2019- 68 yo M, occ cigar smoker for persistent cough, Pulm infiltrate, DVT/ Xarelto, Complicated by GERD, BPH, Hyperlipidemia Stopped cigarettes in 1981, Occ cigar in ED 04/15/2019 for DVT,  pt has cough w/ clear mucus, occasional blood w/ coughing fits, denies SOB/wheezing, denies fever/chills/sweats Finished levaquin.x 10 days course. Quit Stiolto- unhelpful.. Xarelto started 4/22. Body weight today 159 lbs (177 in December), Afebrile,  This problem began in February and has only improved briefly with several antibiotics and at least one round of prednisone. Sputum is not purulent, there are no fever or chills, and no leukocytosis or elevated sed rate- very atypical for pneumonia. However CXR picture as reviewed with him today, shows progressive interstitial process mostly in R mid-lung. CT had shown hilar, mediastinal and supraclavicular adenopathy. R ankle hurts with extended walking and a little swollen still. No problems with Xarelto.  CTa chest 04/14/19 IMPRESSION: 1. No evidence for a pulmonary embolism. 2. Interstitial and airspace disease in the right lung, particularly in the superior segment of the right lower lobe and posterior aspect of the right middle lobe. Findings are suggestive for multi lobar pneumonia. Small amount of disease in left lung. 3. Bilateral hilar lymphadenopathy with mild mediastinal lymphadenopathy. Mildly enlarged left supraclavicular lymph nodes. These lymph nodes may be reactive but recommend surveillance. The pneumonia could be followed with chest radiography and consider a follow-up chest CT with IV contrast in 3 months to evaluate the lymphadenopathy and exclude an underlying neoplastic process. 4.  Aortic Atherosclerosis (ICD10-I70.0). CXR 04/27/19- IMPRESSION: Progressing right-sided bronchopneumonia, predominantly right lower lobe  Labs- D-dimer 22.9 on 4/20     Doppler leg veins 4/22 Pos R DVT  SAR CoV2 antibody serology Neg 4/20           Sed rate 18 on 4/20            WBC 8.700 on 4/20            ANA 1:80            Quantiferon Gold   Neg 4/20  ROS-see HPI   + = positive Constitutional:    weight loss, +night sweats,  fevers, chills, fatigue, lassitude. HEENT:    headaches, difficulty swallowing, tooth/dental problems, sore throat,       sneezing, itching, ear ache, nasal congestion, post nasal drip, snoring CV:    chest pain, orthopnea, PND, swelling in lower extremities, anasarca,                                  dizziness, palpitations Resp:   shortness of breath with exertion or at rest.                +productive cough,   +non-productive cough, coughing up of blood.              change in color of mucus.  wheezing.   Skin:    rash or lesions. GI:  No-   heartburn, indigestion, abdominal pain, nausea, vomiting, diarrhea,                 change in bowel habits, loss of appetite GU: dysuria, change in color of urine, no urgency or frequency.   flank pain. MS:   +joint pain, stiffness, decreased range of motion, back pain. Neuro-     nothing unusual Psych:  change in mood or affect.  depression or anxiety.   memory loss.  OBJ- Physical Exam General- Alert, Oriented, Affect-appropriate, Distress- none acute Skin- rash-none, lesions- none, excoriation- none Lymphadenopathy- ? Slight L>R supraclavicular adenopathy Head- atraumatic            Eyes- Gross vision intact, PERRLA, conjunctivae and secretions clear            Ears- Hearing, canals-normal            Nose- Clear, no-Septal dev, mucus, polyps, erosion, perforation             Throat- Mallampati II-III , mucosa clear , drainage- none, tonsils- atrophic Neck- flexible , trachea midline, no stridor , thyroid nl, carotid no bruit Chest - symmetrical excursion , unlabored           Heart/CV- RRR , no murmur , no gallop  , no rub, nl s1 s2                           - JVD- none , edema+ 1 R ankle, stasis changes- none, varices- none           Lung- clear to P&A, wheeze- none, cough+ dry , dullness-none, rub- none           Chest wall-  Abd-  Br/ Gen/ Rectal- Not done, not indicated Extrem- cyanosis- none, clubbing, none, atrophy- none, strength-  nl Neuro- grossly intact to observation

## 2019-04-28 NOTE — Assessment & Plan Note (Signed)
There is an interstitial infiltrate mostly in sup seg and RML, with a little in LUL. This has progressed, while afebrile, on antibiotics, with clear sputum, weight loss and exhausting cough. Onset of Covid last month raised that concern, but this is not acting like an infection. I told him lymphangitic cancer needs to be considered. We get PET and update labs as we consider biopsy.  DVT might also be paraneoplastic.  Plan- CBC w diff, CMET, PET, Tussionex

## 2019-04-28 NOTE — Telephone Encounter (Signed)
Documentation- Tussionex cough syrup script was e-sent to [facility]. There had been a delay while e-prescribing system was down.

## 2019-05-06 ENCOUNTER — Encounter (HOSPITAL_COMMUNITY)
Admission: RE | Admit: 2019-05-06 | Discharge: 2019-05-06 | Disposition: A | Discharge status: Home / Self Care | Payer: Medicare Other | Source: Ambulatory Visit | Attending: Internal Medicine | Admitting: Internal Medicine

## 2019-05-06 ENCOUNTER — Other Ambulatory Visit: Payer: Self-pay

## 2019-05-06 DIAGNOSIS — R591 Generalized enlarged lymph nodes: Secondary | ICD-10-CM | POA: Diagnosis not present

## 2019-05-06 DIAGNOSIS — R918 Other nonspecific abnormal finding of lung field: Secondary | ICD-10-CM | POA: Insufficient documentation

## 2019-05-06 DIAGNOSIS — R599 Enlarged lymph nodes, unspecified: Secondary | ICD-10-CM

## 2019-05-06 DIAGNOSIS — R590 Localized enlarged lymph nodes: Secondary | ICD-10-CM | POA: Diagnosis not present

## 2019-05-06 MED ORDER — FLUDEOXYGLUCOSE F - 18 (FDG) INJECTION
7.9800 | Freq: Once | INTRAVENOUS | Status: AC | PRN
  Administered 2019-05-06: 7.98 via INTRAVENOUS

## 2019-05-07 LAB — GLUCOSE, CAPILLARY: Glucose-Capillary: 93 mg/dL (ref 70–99)

## 2019-05-08 ENCOUNTER — Ambulatory Visit (INDEPENDENT_AMBULATORY_CARE_PROVIDER_SITE_OTHER): Payer: Medicare Other | Admitting: Internal Medicine

## 2019-05-08 ENCOUNTER — Encounter: Payer: Self-pay | Admitting: Internal Medicine

## 2019-05-08 ENCOUNTER — Telehealth: Payer: Self-pay | Admitting: Internal Medicine

## 2019-05-08 ENCOUNTER — Other Ambulatory Visit: Payer: Self-pay

## 2019-05-08 VITALS — BP 100/60 | HR 106 | Temp 99.2°F | Ht 70.5 in | Wt 154.2 lb

## 2019-05-08 DIAGNOSIS — J181 Lobar pneumonia, unspecified organism: Secondary | ICD-10-CM

## 2019-05-08 DIAGNOSIS — K219 Gastro-esophageal reflux disease without esophagitis: Secondary | ICD-10-CM | POA: Diagnosis not present

## 2019-05-08 DIAGNOSIS — I82401 Acute embolism and thrombosis of unspecified deep veins of right lower extremity: Secondary | ICD-10-CM

## 2019-05-08 DIAGNOSIS — R634 Abnormal weight loss: Secondary | ICD-10-CM | POA: Diagnosis not present

## 2019-05-08 DIAGNOSIS — I82409 Acute embolism and thrombosis of unspecified deep veins of unspecified lower extremity: Secondary | ICD-10-CM | POA: Insufficient documentation

## 2019-05-08 DIAGNOSIS — J189 Pneumonia, unspecified organism: Secondary | ICD-10-CM

## 2019-05-08 MED ORDER — AMOXICILLIN-POT CLAVULANATE 875-125 MG PO TABS: 1.0000 | ORAL_TABLET | Freq: Two times a day (BID) | ORAL | 0 refills | Status: DC

## 2019-05-08 MED ORDER — RIVAROXABAN 20 MG PO TABS: 20.0000 mg | ORAL_TABLET | Freq: Every day | ORAL | 3 refills | Status: DC

## 2019-05-08 MED ORDER — HYDROCOD POLST-CPM POLST ER 10-8 MG/5ML PO SUER: ORAL | 0 refills | Status: DC

## 2019-05-08 MED ORDER — PREDNISONE 10 MG PO TABS: ORAL_TABLET | ORAL | 0 refills | Status: DC

## 2019-05-08 NOTE — H&P (View-Only) (Signed)
HPI  M, occ cigar smoker for persistent cough, Pulm infiltrate, DVT/ Xarelto, Complicated by GERD, BPH, Hyperlipidemia Stopped cigarettes in 1981, Occ cigar  Labs- D-dimer 22.9 on 4/20     Doppler leg veins 4/22 Pos R DVT           SAR CoV2 antibody serology Neg 4/20           Sed rate 18 on 4/20            WBC 8.700 on 4/20            ANA 1:80            Quantiferon Gold   Neg 4/20 Doppler leg veins POS DVT 04/15/2019 CT chest 04/14/2019 PET 05/06/2019-  ---------------------------------------------------------------------------------------  04/28/2019- 68 yo M, occ cigar smoker for persistent cough, Pulm infiltrate, DVT/ Xarelto, Complicated by GERD, BPH, Hyperlipidemia Stopped cigarettes in 1981, Occ cigar in ED 04/15/2019 for DVT, pt has cough w/ clear mucus, occasional blood w/ coughing fits, denies SOB/wheezing, denies fever/chills/sweats Finished levaquin.x 10 days course. Quit Stiolto- unhelpful.. Xarelto started 4/22. Body weight today 159 lbs (177 in December), Afebrile,  This problem began in February and has only improved briefly with several antibiotics and at least one round of prednisone. Sputum is not purulent, there are no fever or chills, and no leukocytosis or elevated sed rate- very atypical for pneumonia. However CXR picture as reviewed with him today, shows progressive interstitial process mostly in R mid-lung. CT had shown hilar, mediastinal and supraclavicular adenopathy. R ankle hurts with extended walking and a little swollen still. No problems with Xarelto.  CTa chest 04/14/19 IMPRESSION: 1. No evidence for a pulmonary embolism. 2. Interstitial and airspace disease in the right lung, particularly in the superior segment of the right lower lobe and posterior aspect of the right middle lobe. Findings are suggestive for multi lobar pneumonia. Small amount of disease in left lung. 3. Bilateral hilar lymphadenopathy with mild mediastinal lymphadenopathy. Mildly enlarged  left supraclavicular lymph nodes. These lymph nodes may be reactive but recommend surveillance. The pneumonia could be followed with chest radiography and consider a follow-up chest CT with IV contrast in 3 months to evaluate the lymphadenopathy and exclude an underlying neoplastic process. 4.  Aortic Atherosclerosis (ICD10-I70.0). CXR 04/27/19- IMPRESSION: Progressing right-sided bronchopneumonia, predominantly right lower lobe  Labs- D-dimer 22.9 on 4/20     Doppler leg veins 4/22 Pos R DVT           SAR CoV2 antibody serology Neg 4/20           Sed rate 18 on 4/20            WBC 8.700 on 4/20            ANA 1:80            Quantiferon Gold   Neg 4/20  05/08/2019-  51 yo M, occ cigar smoker for persistent cough, Pulm infiltrate, DVT/ Xarelto, Complicated by GERD, BPH, Hyperlipidemia Stopped cigarettes in 1981, Occ cigar Has lost more weight- 159>> 154 lbs today Persistent dry cough. Tussionex works best, allowing few hours of sleep. Sometimes at night he coughs up a "little thick stuff" Temp today 99.2 Continues Xarelto w/o bleeding, now on 20 mg daily. Less swelling R ankle.  Labs 04/28/2019- CBC WNL, CMET- Calcium 8.2, otw wnl PET 05/06/2019-  1. Progressive airspace disease and consolidation in the RIGHT lower lobe with peripheral nodularity. There is a moderate to high metabolic activity associated  with this progressive nodular airspace process. 2. Hypermetabolic bilateral hilar and mediastinal lymph nodes. Hypermetabolic small LEFT supraclavicular node. These nodes are relatively small but intensely metabolic for size. 3. This combination of findings is concerning for progressive chronic inflammatory or progressive infectious process; however cannot exclude a malignant process. Consider RIGHT lower lobe bronchoscopy or potentially percutaneous sampling of the LEFT supraclavicular node.   ROS-see HPI   + = positive Constitutional:   + weight loss, +night sweats, fevers,  chills, fatigue, lassitude. HEENT:    headaches, difficulty swallowing, tooth/dental problems, sore throat,       sneezing, itching, ear ache, nasal congestion, post nasal drip, snoring CV:    chest pain, orthopnea, PND, swelling in lower extremities, anasarca,                                  dizziness, palpitations Resp:   shortness of breath with exertion or at rest.                +productive cough,   +non-productive cough, coughing up of blood.              change in color of mucus.  wheezing.   Skin:    rash or lesions. GI:  No-   heartburn, indigestion, abdominal pain, nausea, vomiting, diarrhea,                 change in bowel habits, loss of appetite GU: dysuria, change in color of urine, no urgency or frequency.   flank pain. MS:   +joint pain, stiffness, decreased range of motion, back pain. Neuro-     nothing unusual Psych:  change in mood or affect.  depression or anxiety.   memory loss.  OBJ- Physical Exam General- Alert, Oriented, Affect-appropriate, Distress- none acute Skin- rash-none, lesions- none, excoriation- none Lymphadenopathy- ? Slight L>R supraclavicular adenopathy Head- atraumatic            Eyes- Gross vision intact, PERRLA, conjunctivae and secretions clear            Ears- Hearing, canals-normal            Nose- Clear, no-Septal dev, mucus, polyps, erosion, perforation             Throat- Mallampati II-III , mucosa clear , drainage- none, tonsils- atrophic Neck- flexible , trachea midline, no stridor , thyroid nl, carotid no bruit Chest - symmetrical excursion , unlabored           Heart/CV- RRR , no murmur , no gallop  , no rub, nl s1 s2                           - JVD- none , edema+ 1 R ankle, stasis changes- none, varices- none           Lung- clear to P&A, wheeze- none, cough+ dry , dullness-none, rub- none           Chest wall-  Abd-  Br/ Gen/ Rectal- Not done, not indicated Extrem- cyanosis- none, clubbing, none, atrophy- none, strength- nl Neuro-  grossly intact to observation

## 2019-05-08 NOTE — Assessment & Plan Note (Signed)
Further weight loss with ongoing acute illness.

## 2019-05-08 NOTE — Telephone Encounter (Signed)
Pt is calling back [phone]

## 2019-05-08 NOTE — Assessment & Plan Note (Signed)
Continues reflux precautions. Denies any obvious reflux event Continues omeprazole

## 2019-05-08 NOTE — Addendum Note (Signed)
Addended by: Annie Paras D on: 05/08/2019 10:23 AM   Modules accepted: Orders

## 2019-05-08 NOTE — Patient Instructions (Addendum)
I will discuss your situation, and choice of lymph node biopsy vs bronchoscopy, with my partner.  Scripts sent for augmentin antibiotic, Tussionex cough syrup, and a prednisone taper.  Xarelto20 mg refilled to continue for now, once daily  Order- Sputum C&S- routine, fungal,  And AFB/ mycobacteria/ fluorochrome  Please call as needed

## 2019-05-08 NOTE — Assessment & Plan Note (Signed)
Today temp 99.2, but WBC remains normal, Cough minimally productive now- we will try to culture- giving cup.  As Covid allows, he needs bronchoscopy for BAL and possible TBBX vs needle bx L supraclavicular node. I favor repeated aspiration pneumonia, although he doesn't recognize obvious events, vs atypical organism. We will try one more round prednisone with augmentin, but he noted little lasting benefit from prior antibiotics or prednisone course.  I am asking Dr Lamonte Sakai to review.

## 2019-05-08 NOTE — Addendum Note (Signed)
Addended by: Annie Paras D on: 05/08/2019 10:15 AM   Modules accepted: Orders

## 2019-05-08 NOTE — Telephone Encounter (Signed)
Spoke with patient. He stated that he was reading over his medication list and saw where the starter pack was still on his list. He wanted to make sure that he wasn't starting over again.   Advised him that per the chart, he is supposed to be on 20mg  daily. He verbalized understanding.   Nothing further needed at time of call.

## 2019-05-08 NOTE — Telephone Encounter (Signed)
Left message for patient to call back. Per his chart, CY advised him to take 20mg  once daily with supper, not restart a starter pack.

## 2019-05-08 NOTE — Assessment & Plan Note (Addendum)
He continues Xarelto for now. DVT presumably provoked by R lung inflammatory process, which is ongoing.

## 2019-05-08 NOTE — Progress Notes (Signed)
HPI  M, occ cigar smoker for persistent cough, Pulm infiltrate, DVT/ Xarelto, Complicated by GERD, BPH, Hyperlipidemia Stopped cigarettes in 1981, Occ cigar  Labs- D-dimer 22.9 on 4/20     Doppler leg veins 4/22 Pos R DVT           SAR CoV2 antibody serology Neg 4/20           Sed rate 18 on 4/20            WBC 8.700 on 4/20            ANA 1:80            Quantiferon Gold   Neg 4/20 Doppler leg veins POS DVT 04/15/2019 CT chest 04/14/2019 PET 05/06/2019-  ---------------------------------------------------------------------------------------  04/28/2019- 68 yo M, occ cigar smoker for persistent cough, Pulm infiltrate, DVT/ Xarelto, Complicated by GERD, BPH, Hyperlipidemia Stopped cigarettes in 1981, Occ cigar in ED 04/15/2019 for DVT, pt has cough w/ clear mucus, occasional blood w/ coughing fits, denies SOB/wheezing, denies fever/chills/sweats Finished levaquin.x 10 days course. Quit Stiolto- unhelpful.. Xarelto started 4/22. Body weight today 159 lbs (177 in December), Afebrile,  This problem began in February and has only improved briefly with several antibiotics and at least one round of prednisone. Sputum is not purulent, there are no fever or chills, and no leukocytosis or elevated sed rate- very atypical for pneumonia. However CXR picture as reviewed with him today, shows progressive interstitial process mostly in R mid-lung. CT had shown hilar, mediastinal and supraclavicular adenopathy. R ankle hurts with extended walking and a little swollen still. No problems with Xarelto.  CTa chest 04/14/19 IMPRESSION: 1. No evidence for a pulmonary embolism. 2. Interstitial and airspace disease in the right lung, particularly in the superior segment of the right lower lobe and posterior aspect of the right middle lobe. Findings are suggestive for multi lobar pneumonia. Small amount of disease in left lung. 3. Bilateral hilar lymphadenopathy with mild mediastinal lymphadenopathy. Mildly enlarged  left supraclavicular lymph nodes. These lymph nodes may be reactive but recommend surveillance. The pneumonia could be followed with chest radiography and consider a follow-up chest CT with IV contrast in 3 months to evaluate the lymphadenopathy and exclude an underlying neoplastic process. 4.  Aortic Atherosclerosis (ICD10-I70.0). CXR 04/27/19- IMPRESSION: Progressing right-sided bronchopneumonia, predominantly right lower lobe  Labs- D-dimer 22.9 on 4/20     Doppler leg veins 4/22 Pos R DVT           SAR CoV2 antibody serology Neg 4/20           Sed rate 18 on 4/20            WBC 8.700 on 4/20            ANA 1:80            Quantiferon Gold   Neg 4/20  05/08/2019-  35 yo M, occ cigar smoker for persistent cough, Pulm infiltrate, DVT/ Xarelto, Complicated by GERD, BPH, Hyperlipidemia Stopped cigarettes in 1981, Occ cigar Has lost more weight- 159>> 154 lbs today Persistent dry cough. Tussionex works best, allowing few hours of sleep. Sometimes at night he coughs up a "little thick stuff" Temp today 99.2 Continues Xarelto w/o bleeding, now on 20 mg daily. Less swelling R ankle.  Labs 04/28/2019- CBC WNL, CMET- Calcium 8.2, otw wnl PET 05/06/2019-  1. Progressive airspace disease and consolidation in the RIGHT lower lobe with peripheral nodularity. There is a moderate to high metabolic activity associated  with this progressive nodular airspace process. 2. Hypermetabolic bilateral hilar and mediastinal lymph nodes. Hypermetabolic small LEFT supraclavicular node. These nodes are relatively small but intensely metabolic for size. 3. This combination of findings is concerning for progressive chronic inflammatory or progressive infectious process; however cannot exclude a malignant process. Consider RIGHT lower lobe bronchoscopy or potentially percutaneous sampling of the LEFT supraclavicular node.   ROS-see HPI   + = positive Constitutional:   + weight loss, +night sweats, fevers,  chills, fatigue, lassitude. HEENT:    headaches, difficulty swallowing, tooth/dental problems, sore throat,       sneezing, itching, ear ache, nasal congestion, post nasal drip, snoring CV:    chest pain, orthopnea, PND, swelling in lower extremities, anasarca,                                  dizziness, palpitations Resp:   shortness of breath with exertion or at rest.                +productive cough,   +non-productive cough, coughing up of blood.              change in color of mucus.  wheezing.   Skin:    rash or lesions. GI:  No-   heartburn, indigestion, abdominal pain, nausea, vomiting, diarrhea,                 change in bowel habits, loss of appetite GU: dysuria, change in color of urine, no urgency or frequency.   flank pain. MS:   +joint pain, stiffness, decreased range of motion, back pain. Neuro-     nothing unusual Psych:  change in mood or affect.  depression or anxiety.   memory loss.  OBJ- Physical Exam General- Alert, Oriented, Affect-appropriate, Distress- none acute Skin- rash-none, lesions- none, excoriation- none Lymphadenopathy- ? Slight L>R supraclavicular adenopathy Head- atraumatic            Eyes- Gross vision intact, PERRLA, conjunctivae and secretions clear            Ears- Hearing, canals-normal            Nose- Clear, no-Septal dev, mucus, polyps, erosion, perforation             Throat- Mallampati II-III , mucosa clear , drainage- none, tonsils- atrophic Neck- flexible , trachea midline, no stridor , thyroid nl, carotid no bruit Chest - symmetrical excursion , unlabored           Heart/CV- RRR , no murmur , no gallop  , no rub, nl s1 s2                           - JVD- none , edema+ 1 R ankle, stasis changes- none, varices- none           Lung- clear to P&A, wheeze- none, cough+ dry , dullness-none, rub- none           Chest wall-  Abd-  Br/ Gen/ Rectal- Not done, not indicated Extrem- cyanosis- none, clubbing, none, atrophy- none, strength- nl Neuro-  grossly intact to observation

## 2019-05-11 ENCOUNTER — Telehealth: Payer: Self-pay | Admitting: Internal Medicine

## 2019-05-11 ENCOUNTER — Other Ambulatory Visit: Payer: Medicare Other

## 2019-05-11 DIAGNOSIS — J189 Pneumonia, unspecified organism: Secondary | ICD-10-CM

## 2019-05-11 DIAGNOSIS — J181 Lobar pneumonia, unspecified organism: Secondary | ICD-10-CM | POA: Diagnosis not present

## 2019-05-11 NOTE — Telephone Encounter (Signed)
Called & spoke w/ patient in regards to his sputum sample. I informed pt that the sample was taken care of and that we will call him with results once they are processed. Pt verbalized understanding with no additional questions. Nothing further needed at this time.

## 2019-05-15 ENCOUNTER — Telehealth: Payer: Self-pay

## 2019-05-15 ENCOUNTER — Telehealth: Payer: Self-pay | Admitting: Internal Medicine

## 2019-05-15 ENCOUNTER — Other Ambulatory Visit: Payer: Self-pay | Admitting: Internal Medicine

## 2019-05-15 DIAGNOSIS — J189 Pneumonia, unspecified organism: Secondary | ICD-10-CM

## 2019-05-15 NOTE — Telephone Encounter (Signed)
Called & spoke w/ pt. Informed him his bronchoscopy is scheduled for 05/21/2019 at 11:45 AM at [facility] w/ Dr. Lamonte Sakai. Let him know to arrive at the main entrance/general desk two hours prior to the scheduled time. Confirmed that his appt for COVID screening is on 05/19/2019 at 8:55 AM at the [facility]. Pt was contacted by Burman Nieves for scheduling and instructions.  I also informed pt that he should stop his Xarelto anticoagulation 2 days before the procedure (see CY's recommendations on phone note from 05/11/2019)-- informed him his last dose should be taken on Monday 05/25 and that he is to hold further doses per pre-procedural protocol. Pt verbalized understanding.  Pt has been made aware of all the above measures. Will route to CY as an [place]. Nothing further needed at this time.

## 2019-05-15 NOTE — Telephone Encounter (Signed)
This has been addressed in another message. Nothing further is needed at this time.

## 2019-05-15 NOTE — Telephone Encounter (Signed)
LMTCB. Please make patient aware his Bronch is scheduled for 05/28 at 12 noon at [facility]. He will need to stop his blood thinner 05/26. Last dose 05/26 and hold until after Bronch. Will hold in triage until we speak to patient.

## 2019-05-15 NOTE — Telephone Encounter (Signed)
Spoke with the pt  He was asking about sputum results  I advised this can take up to 6 wks for final results  He verbalized understanding  He wants to know status of bronch and let Dr Annamaria Boots know that he is willing to undergo whatever it is that he may recommend  Please advise thanks!

## 2019-05-15 NOTE — Telephone Encounter (Signed)
Pt is calling back [phone]

## 2019-05-15 NOTE — Telephone Encounter (Signed)
Please see phone note about bronch planning.  He will need to stop his Xarelto anticoagulation 2 days before the procedure.

## 2019-05-15 NOTE — Telephone Encounter (Signed)
Noted. Pt has been contacted with this information in addition to details regarding his scheduled COVID screening and bronchoscopy. See phone note from 05/15/2019. Nothing further needed at this time.

## 2019-05-15 NOTE — Telephone Encounter (Signed)
Call returned to patient, made aware we are aiming for the 28th however . He state he is on a blood thinner and will need to know ahead of time. Made aware we will keep that in mind. Order for Bronch has been placed. Made aware to continue his blood thinner until he hears back from [place]. Voiced understanding. Will send message to order physician as FYI until bronch has been scheduled. Will schedule covid testing once bronch has been scheduled.

## 2019-05-15 NOTE — Telephone Encounter (Signed)
Please coordinate with Annie Paras- I had contacted her about this. I will be out of town next week

## 2019-05-15 NOTE — Telephone Encounter (Signed)
Kyle Garcia, Can you help with this?  Dr Lamonte Sakai has agreed to do bronchoscopy, but can't do it sooner than 5/28. He will also check with Dr Valeta Harms to see which could do it soonest..  I have asked them to set it up. He needs to have a negative Covid test before he can proceed with scheduling. Can you get Kyle Garcia to help you set that up please, and let patient know. Thanks

## 2019-05-15 NOTE — Telephone Encounter (Signed)
Will route message to Kyle Garcia to get patient scheduled for covid testing. Bronch is scheduled for 05/28. Covid screen negative.

## 2019-05-15 NOTE — Telephone Encounter (Signed)
Per Burman Nieves, if pt is not having any current COVID symptoms, the protocol stands that COVID testing will be scheduled no more than 7 days prior to the scheduling of the bronch.   Pt was last seen in-office 05/08/2019. I attempted to call pt to assess his current symptoms. Spoke w/ pt's wife [name], who stated she will have him call the office back. Will keep encounter open to f/u on.

## 2019-05-18 NOTE — Telephone Encounter (Signed)
Noted  

## 2019-05-19 ENCOUNTER — Other Ambulatory Visit (HOSPITAL_COMMUNITY)
Admission: RE | Admit: 2019-05-19 | Discharge: 2019-05-19 | Disposition: A | Discharge status: Home / Self Care | Payer: Medicare Other | Source: Ambulatory Visit | Attending: Emergency Medicine | Admitting: Emergency Medicine

## 2019-05-19 DIAGNOSIS — Z1159 Encounter for screening for other viral diseases: Secondary | ICD-10-CM | POA: Diagnosis not present

## 2019-05-19 LAB — SARS CORONAVIRUS 2 BY RT PCR (HOSPITAL ORDER, PERFORMED IN ~~LOC~~ HOSPITAL LAB): SARS Coronavirus 2: NEGATIVE

## 2019-05-20 ENCOUNTER — Telehealth: Payer: Self-pay | Admitting: Internal Medicine

## 2019-05-20 NOTE — Telephone Encounter (Signed)
Spoke with patient. He stated that he received a call from respiratory advising him of what time he needed to be at the hospital. He is aware of where he needs to go and what time he needs to be there.   Advised him to call us back if he had any other questions.   Nothing further needed at time of call.

## 2019-05-20 NOTE — Telephone Encounter (Signed)
Left message for respiratory to call back to confirm his appt.   Spoke with patient. He is scheduled for a bronch tomorrow but wasn't sure of the time. He originally had one scheduled at 9am but it was cancelled. There is another one scheduled at 12 but in the appt notes, it says 1pm. Explained to patient that I have reached out to the respiratory dept to get the correct appt time. As soon as I get a response, I will call him back.   Leaving this in triage to follow up on at 345 as I am leaving for the day at 4pm.

## 2019-05-21 ENCOUNTER — Encounter (HOSPITAL_COMMUNITY): Payer: Self-pay | Admitting: Vascular Surgery

## 2019-05-21 ENCOUNTER — Ambulatory Visit (HOSPITAL_COMMUNITY): Payer: Medicare Other

## 2019-05-21 ENCOUNTER — Ambulatory Visit (HOSPITAL_COMMUNITY)
Admission: RE | Admit: 2019-05-21 | Discharge: 2019-05-21 | Disposition: A | Discharge status: Home / Self Care | Payer: Medicare Other | Source: Ambulatory Visit | Attending: Emergency Medicine | Admitting: Emergency Medicine

## 2019-05-21 ENCOUNTER — Encounter (HOSPITAL_COMMUNITY): Payer: Self-pay | Admitting: Respiratory Therapy

## 2019-05-21 ENCOUNTER — Ambulatory Visit (HOSPITAL_COMMUNITY)
Admission: RE | Admit: 2019-05-21 | Discharge: 2019-05-21 | Disposition: A | Discharge status: Home / Self Care | Payer: Medicare Other | Attending: Emergency Medicine | Admitting: Emergency Medicine

## 2019-05-21 ENCOUNTER — Ambulatory Visit (HOSPITAL_COMMUNITY): Admission: RE | Admit: 2019-05-21 | Payer: Medicare Other | Source: Home / Self Care | Admitting: Emergency Medicine

## 2019-05-21 ENCOUNTER — Inpatient Hospital Stay (HOSPITAL_COMMUNITY): Admission: RE | Admit: 2019-05-21 | Payer: Medicare Other | Source: Ambulatory Visit

## 2019-05-21 ENCOUNTER — Encounter (HOSPITAL_COMMUNITY)
Admission: RE | Disposition: A | Discharge status: Home / Self Care | Payer: Self-pay | Source: Home / Self Care | Attending: Emergency Medicine

## 2019-05-21 ENCOUNTER — Encounter (HOSPITAL_COMMUNITY): Admission: RE | Payer: Self-pay | Source: Home / Self Care

## 2019-05-21 DIAGNOSIS — R05 Cough: Secondary | ICD-10-CM | POA: Diagnosis not present

## 2019-05-21 DIAGNOSIS — J387 Other diseases of larynx: Secondary | ICD-10-CM | POA: Diagnosis not present

## 2019-05-21 DIAGNOSIS — C3431 Malignant neoplasm of lower lobe, right bronchus or lung: Secondary | ICD-10-CM | POA: Insufficient documentation

## 2019-05-21 DIAGNOSIS — Z9889 Other specified postprocedural states: Secondary | ICD-10-CM | POA: Diagnosis not present

## 2019-05-21 DIAGNOSIS — R918 Other nonspecific abnormal finding of lung field: Secondary | ICD-10-CM | POA: Diagnosis present

## 2019-05-21 DIAGNOSIS — I700 Atherosclerosis of aorta: Secondary | ICD-10-CM | POA: Insufficient documentation

## 2019-05-21 DIAGNOSIS — Z7901 Long term (current) use of anticoagulants: Secondary | ICD-10-CM | POA: Diagnosis not present

## 2019-05-21 DIAGNOSIS — Z79899 Other long term (current) drug therapy: Secondary | ICD-10-CM | POA: Diagnosis not present

## 2019-05-21 DIAGNOSIS — J690 Pneumonitis due to inhalation of food and vomit: Secondary | ICD-10-CM | POA: Diagnosis present

## 2019-05-21 DIAGNOSIS — J189 Pneumonia, unspecified organism: Secondary | ICD-10-CM

## 2019-05-21 DIAGNOSIS — R059 Cough, unspecified: Secondary | ICD-10-CM | POA: Diagnosis present

## 2019-05-21 DIAGNOSIS — J181 Lobar pneumonia, unspecified organism: Secondary | ICD-10-CM | POA: Diagnosis not present

## 2019-05-21 DIAGNOSIS — F17290 Nicotine dependence, other tobacco product, uncomplicated: Secondary | ICD-10-CM | POA: Diagnosis not present

## 2019-05-21 DIAGNOSIS — C3491 Malignant neoplasm of unspecified part of right bronchus or lung: Secondary | ICD-10-CM | POA: Diagnosis not present

## 2019-05-21 HISTORY — PX: VIDEO BRONCHOSCOPY: SHX5072

## 2019-05-21 LAB — BODY FLUID CELL COUNT WITH DIFFERENTIAL
Eos, Fluid: 8 %
Lymphs, Fluid: 6 %
Monocyte-Macrophage-Serous Fluid: 28 % — ABNORMAL LOW (ref 50–90)
Neutrophil Count, Fluid: 58 % — ABNORMAL HIGH (ref 0–25)
Total Nucleated Cell Count, Fluid: 255 cu mm (ref 0–1000)

## 2019-05-21 SURGERY — BRONCHOSCOPY, WITH FLUOROSCOPY
Anesthesia: Moderate Sedation | Laterality: Bilateral

## 2019-05-21 SURGERY — VIDEO BRONCHOSCOPY WITH ENDOBRONCHIAL NAVIGATION
Anesthesia: General

## 2019-05-21 MED ORDER — FENTANYL CITRATE (PF) 100 MCG/2ML IJ SOLN
INTRAMUSCULAR | Status: DC | PRN
  Administered 2019-05-21: 25 ug via INTRAVENOUS
  Administered 2019-05-21: 50 ug via INTRAVENOUS
  Administered 2019-05-21 (×2): 25 ug via INTRAVENOUS
  Administered 2019-05-21: 50 ug via INTRAVENOUS

## 2019-05-21 MED ORDER — LIDOCAINE HCL 2 % EX GEL: 1.0000 "application " | Freq: Once | CUTANEOUS | Status: DC

## 2019-05-21 MED ORDER — RIVAROXABAN 20 MG PO TABS: 20.0000 mg | ORAL_TABLET | Freq: Every day | ORAL | 3 refills | Status: DC

## 2019-05-21 MED ORDER — SODIUM CHLORIDE 0.9 % IV SOLN
INTRAVENOUS | Status: DC
  Administered 2019-05-21: 13:00:00 via INTRAVENOUS

## 2019-05-21 MED ORDER — MIDAZOLAM HCL (PF) 5 MG/ML IJ SOLN
INTRAMUSCULAR | Status: AC
  Filled 2019-05-21: qty 2

## 2019-05-21 MED ORDER — LIDOCAINE HCL URETHRAL/MUCOSAL 2 % EX GEL
CUTANEOUS | Status: DC | PRN
  Administered 2019-05-21: 1

## 2019-05-21 MED ORDER — LIDOCAINE HCL 1 % IJ SOLN
INTRAMUSCULAR | Status: DC | PRN
  Administered 2019-05-21: 6 mL via RESPIRATORY_TRACT

## 2019-05-21 MED ORDER — BENZONATATE 200 MG PO CAPS: 200.0000 mg | ORAL_CAPSULE | Freq: Three times a day (TID) | ORAL | 1 refills | Status: DC | PRN

## 2019-05-21 MED ORDER — FENTANYL CITRATE (PF) 100 MCG/2ML IJ SOLN
INTRAMUSCULAR | Status: AC
  Filled 2019-05-21: qty 4

## 2019-05-21 MED ORDER — MIDAZOLAM HCL (PF) 10 MG/2ML IJ SOLN
INTRAMUSCULAR | Status: DC | PRN
  Administered 2019-05-21: 2 mg via INTRAVENOUS
  Administered 2019-05-21: 3 mg via INTRAVENOUS
  Administered 2019-05-21: 1 mg via INTRAVENOUS

## 2019-05-21 MED ORDER — PHENYLEPHRINE HCL 0.25 % NA SOLN: 1.0000 | Freq: Four times a day (QID) | NASAL | Status: DC | PRN

## 2019-05-21 MED ORDER — PHENYLEPHRINE HCL 0.25 % NA SOLN
NASAL | Status: DC | PRN
  Administered 2019-05-21: 2 via NASAL

## 2019-05-21 NOTE — Discharge Instructions (Signed)
Flexible Bronchoscopy, Care After This sheet gives you information about how to care for yourself after your test. Your doctor may also give you more specific instructions. If you have problems or questions, contact your doctor. Follow these instructions at home: Eating and drinking  Do not eat or drink anything (not even water) for 2 hours after your test, or until your numbing medicine (local anesthetic) wears off.  When your numbness is gone and your cough and gag reflexes have come back, you may: ? Eat only soft foods. ? Slowly drink liquids.  The day after the test, go back to your normal diet. Driving  Do not drive for 24 hours if you were given a medicine to help you relax (sedative).  Do not drive or use heavy machinery while taking prescription pain medicine. General instructions   Take over-the-counter and prescription medicines only as told by your doctor.  Return to your normal activities as told. Ask what activities are safe for you.  Do not use any products that have nicotine or tobacco in them. This includes cigarettes and e-cigarettes. If you need help quitting, ask your doctor.  Keep all follow-up visits as told by your doctor. This is important. It is very important if you had a tissue sample (biopsy) taken. Get help right away if:  You have shortness of breath that gets worse.  You get light-headed.  You feel like you are going to pass out (faint).  You have chest pain.  You cough up: ? More than a little blood. ? More blood than before. Summary  Do not eat or drink anything (not even water) for 2 hours after your test, or until your numbing medicine wears off.  Do not use cigarettes. Do not use e-cigarettes.  Get help right away if you have chest pain. This information is not intended to replace advice given to you by your health care provider. Make sure you discuss any questions you have with your health care provider. Document Released: 10/07/2009  Document Revised: 12/28/2016 Document Reviewed: 12/28/2016 Elsevier Interactive Patient Education  2019 [place].  Nothing to eat or drink until    3:30   pm today   05/21/2019  Any questions or concerns please call the [facility] at [phone]

## 2019-05-21 NOTE — Op Note (Signed)
[facility] Cardiopulmonary Patient Name: Kyle Garcia Date: 05/21/2019 MRN: [id number] Attending MD: Collene Gobble , MD Date of Birth: [birth year] CSN: Finalized Age: 68 Admit Type: Outpatient Gender: Male Procedure:            Bronchoscopy Indications:          Unresolving right lower lobe infiltrate, Chronic cough                        with abnormal CT Providers:            Collene Gobble, MD, Andre Lefort RRT,RCP, Christin Fudge Referring MD:          Medicines:            Midazolam 6 mg IV, Fentanyl 175 mcg IV, Lidocaine 1%                        applied to cords 16 mL, Lidocaine 1% applied to the                        tracheobronchial tree 16 mL Complications:        No immediate complications Estimated Blood Loss: Estimated blood loss was minimal. Procedure:            Pre-Anesthesia Assessment:                       - A History and Physical has been performed. Patient                        meds and allergies have been reviewed. The risks and                        benefits of the procedure and the sedation options and                        risks were discussed with the patient. All questions                        were answered and informed consent was obtained.                        Patient identification and proposed procedure were                        verified prior to the procedure by the physician in the                        procedure room. Mental Status Examination: alert and                        oriented. Airway Examination: normal oropharyngeal                        airway. Respiratory Examination: clear to auscultation.                        CV Examination: regular rate and rhythm. Prior  Anticoagulants: The patient has taken Xarelto                        (rivaroxaban), last dose was 2 days prior to procedure.                        ASA Grade Assessment: II - A patient with  mild systemic                        disease. After reviewing the risks and benefits, the                        patient was deemed in satisfactory condition to undergo                        the procedure. The anesthesia plan was to use moderate                        sedation / analgesia (conscious sedation). Immediately                        prior to administration of medications, the patient was                        re-assessed for adequacy to receive sedatives. The                        heart rate, respiratory rate, oxygen saturations, blood                        pressure, adequacy of pulmonary ventilation, and                        response to care were monitored throughout the                        procedure. The physical status of the patient was                        re-assessed after the procedure.                       After obtaining informed consent, the bronchoscope was                        passed under direct vision. Throughout the procedure,                        the patient's blood pressure, pulse, and oxygen                        saturations were monitored continuously. the BF-H190                        ([id number]) Olympus Diagnostic Bronchoscope was                        introduced through the right nostril and advanced to  the tracheobronchial tree. The procedure was                        accomplished with moderate difficulty due to the                        patient's coughing. The patient tolerated the procedure                        well. The total duration of the procedure was 33                        minutes. Scope In: 1:32:44 PM Scope Out: 1:58:11 PM Findings:      Larynx: Hyperplastic changes were found diffusely, throughout the       larynx. The changes are not obstructing the airway. Erythema and some       irregularity was found at the bilateral vocal cords. No clear ulceration.      The nasopharynx/oropharynx appears  normal. The trachea is of normal       caliber. The carina is sharp. The tracheobronchial tree of the left lung       was examined to at least the first subsegmental level. Bronchial mucosa       and anatomy in the left lung are normal; there are no endobronchial       lesions, and no secretions.      Right Lung Abnormalities: Friable mucosa was found in the right upper       lobe and in the superior segment of the right lower lobe (B6). Mucosal       irregularity was found in the right upper lobe with some slightly raised       hypopigmented areas associated with some blood. Question whether this       was scope trauma (to very slight manipulation). Fresh blood was found on       the right upper lobe airway, again possible scope trauma. there was       blood from the RLL superior segment with only gentle suctioning,       consistent with irritated, friable tissue. BAL was performed in the RLL       superior segment (B6) of the lung and sent for cell count, bacterial       culture, viral smears & culture, and fungal & AFB analysis and cytology.       60 mL of fluid were instilled. 15 mL were returned. The return was       blood-tinged and cellular. There were no mucoid plugs in the return       fluid. Transbronchial biopsies were performed in the superior segment of       the right lower lobe using forceps and sent for histopathology       examination. The procedure was guided by fluoroscopy. Transbronchial       biopsy technique was selected because the sampling site was not visible       endoscopically. Four biopsy passes were performed. Four biopsy samples       were obtained. Impression:           - Unresolving right lower lobe infiltrate                       - Chronic cough with abnormal CT                       -  Hyperplastic changes were seen diffusely, throughout                        the larynx.                       - Erythema was visualized at the vocal cords.                        - The airway examination of the left lung was normal.                       - Friable mucosa was found in the right upper lobe and                        in the superior segment of the right lower lobe (B6).                       - Mucosal irregularity was visualized in the right                        upper lobe.                       - Blood was present in the right upper lobe and in the                        right lower lobe airways, question due to scope trauma.                       - Bronchoalveolar lavage was performed superior segment                        RLL.                       - Transbronchial lung biopsies were performed superior                        segment RLL . Moderate Sedation:      Moderate (conscious) sedation was personally administered by the       endoscopist. The following parameters were monitored: oxygen saturation,       heart rate, blood pressure, respiratory rate, EKG, adequacy of pulmonary       ventilation, and response to care. Total physician intraservice time was       33 minutes. Recommendation:       - Await BAL and biopsy results. Procedure Code(s):    --- Professional ---                       [phone], Bronchoscopy, rigid or flexible, including                        fluoroscopic guidance, when performed; with                        transbronchial lung biopsy(s), single lobe                       [id number], Bronchoscopy, rigid or flexible, including  fluoroscopic guidance, when performed; with bronchial                        alveolar lavage                       [id number], Moderate sedation services provided by the same                        physician or other qualified health care professional                        performing the diagnostic or therapeutic service that                        the sedation supports, requiring the presence of an                        independent trained observer to assist in the                         monitoring of the patient's level of consciousness and                        physiological status; initial 15 minutes of                        intraservice time, patient age 71 years or older                       [phone], Moderate sedation; each additional 15 minutes                        intraservice time Diagnosis Code(s):    --- Professional ---                       R91.8, Other nonspecific abnormal finding of lung field                       R05, Cough CPT copyright 2019 [employer]. All rights reserved. The codes documented in this report are preliminary and upon coder review may  be revised to meet current compliance requirements. Collene Gobble, MD Collene Gobble, MD 05/21/2019 2:31:03 PM Number of Addenda: 0

## 2019-05-21 NOTE — Progress Notes (Signed)
Video Bronchoscopy done Interventional bronchial biopsy done Interventional bronchial washing done Procedure tolerated well

## 2019-05-21 NOTE — Interval H&P Note (Signed)
PCCM interval Note  68 year old man with a history of former cigarettes and current cigar smoking, DVT, GERD, BPH, hyperlipidemia.  Under evaluation by Dr. Annamaria Boots in our office for chronic cough since February 2020.  This associated with a right lower lobe patchy infiltrate that has not resolved despite treatment with several courses of antibiotics, corticosteroids.  Sputum culture data has been unremarkable.  Most recent imaging was a PET scan that was done on 05/07/2019 which I reviewed.  This showed the patchy right lower lobe and now some right middle lobe opacity associated with various degrees of hypermetabolism.  There was also a hypermetabolic supraclavicular node on the left, unclear significance.  He presents today describing continued cough, can be paroxysmal and sometimes make you feel like he is going to be unable to catch his breath.  He is almost gone to the emergency department a couple of times under situations like this.  No real sputum, no blood.  He stopped his Xarelto 2-1/2 days ago.  SARS CoV2 testing was negative on 05/19/2019.  Vitals:   05/21/19 1240 05/21/19 1245 05/21/19 1250 05/21/19 1255  BP: 129/75 135/88 124/78 117/78  Pulse: (!) 101 (!) 107 100 (!) 108  Resp: 16 17 (!) 23 (!) 24  Temp:      TempSrc:      SpO2: 97% 100% 98% 99%   Well-appearing thin gentleman, no distress. Lungs clear bilaterally Heart regular without a murmur Abdomen benign No significant edema  Plan for bronchoscopy with airway inspection and biopsies of the right lower lobe, BAL for culture data including atypical organisms.  Procedure explained to the patient, risks and benefits discussed.  He agrees to proceed.  Baltazar Apo, MD, PhD 05/21/2019, 1:19 PM [facility] [phone] or if no answer [phone]

## 2019-05-22 ENCOUNTER — Encounter (HOSPITAL_COMMUNITY): Payer: Self-pay | Admitting: Emergency Medicine

## 2019-05-22 LAB — ACID FAST SMEAR (AFB, MYCOBACTERIA): Acid Fast Smear: NEGATIVE

## 2019-05-24 LAB — CULTURE, BAL-QUANTITATIVE W GRAM STAIN
Culture: >=100000 — AB
Gram Stain: NONE SEEN

## 2019-05-26 ENCOUNTER — Encounter: Payer: Self-pay | Admitting: Emergency Medicine

## 2019-05-26 ENCOUNTER — Telehealth: Payer: Self-pay | Admitting: Emergency Medicine

## 2019-05-26 DIAGNOSIS — C3491 Malignant neoplasm of unspecified part of right bronchus or lung: Secondary | ICD-10-CM

## 2019-05-26 NOTE — Telephone Encounter (Signed)
I discussed results of FOB with him >> RLL TBBx show adenoCA. I discussed also w Dr Annamaria Boots.   Will make referral to [facility] to continue his care. PET has already been done. He may need MRI Brain. He has a L supraclavicular node that might upstage him - may need to have a surgical biopsy of this in order to plan therapy.

## 2019-05-27 ENCOUNTER — Telehealth: Payer: Self-pay | Admitting: *Deleted

## 2019-05-27 DIAGNOSIS — R918 Other nonspecific abnormal finding of lung field: Secondary | ICD-10-CM

## 2019-05-27 NOTE — Telephone Encounter (Signed)
Oncology Nurse Navigator Documentation  Oncology Nurse Navigator Flowsheets 05/27/2019  Navigator Location [facility]  Referral date to RadOnc/MedOnc 05/26/2019  Navigator Encounter Type Telephone/I received referral on Kyle Garcia yesterday.  I spoke with Dr. Julien Nordmann and he states patient needs to be seen at [facility].  I called and scheduled patient to be seen on 06/04/2019.  He verbalized understanding of appt time and place.   Telephone Outgoing Call  Treatment Phase Pre-Tx/Tx Discussion  Barriers/Navigation Needs Education;Coordination of Care  Education Other  Interventions Coordination of Care;Education  Coordination of Care Appts  Education Method Verbal  Acuity Level 2  Time Spent with Patient 30

## 2019-05-28 ENCOUNTER — Other Ambulatory Visit: Payer: Self-pay | Admitting: *Deleted

## 2019-05-28 ENCOUNTER — Encounter: Payer: Self-pay | Admitting: *Deleted

## 2019-05-28 ENCOUNTER — Telehealth: Payer: Self-pay | Admitting: Medical Oncology

## 2019-05-28 NOTE — Progress Notes (Signed)
Oncology Nurse Navigator Documentation  Oncology Nurse Navigator Flowsheets 05/28/2019  Navigator Location [facility]  Referral date to RadOnc/MedOnc -  Navigator Encounter Type Other/per Dr. Julien Nordmann, I requested Foundation One and PDL 1 testing on patient's recent tissue DX  Telephone -  Abnormal Finding Date 04/14/2019  Confirmed Diagnosis Date 05/21/2019  Treatment Phase Pre-Tx/Tx Discussion  Barriers/Navigation Needs Coordination of Care  Education -  Interventions Coordination of Care  Coordination of Care Other  Education Method -  Acuity Level 2  Time Spent with Patient 30

## 2019-05-28 NOTE — Progress Notes (Signed)
The proposed treatment discussed in cancer conference 05/28/2019 is for discussion purpose only and is not a binding recommendation. The patient was not physically examined nor present for their treatment options.  Therefore, final treatment plans cannot be decided.

## 2019-05-28 NOTE — Telephone Encounter (Signed)
Got it, thank you!!

## 2019-05-28 NOTE — Telephone Encounter (Signed)
Call [facility] with ICD-10 code and stage.Pt coming next week to [place].

## 2019-05-29 ENCOUNTER — Telehealth: Payer: Self-pay | Admitting: Internal Medicine

## 2019-05-29 NOTE — Telephone Encounter (Signed)
Returned call to patient:  He says when he 1st wake in the am and at night  he has been coughing up bright red blood (less than a half teaspoon). He is not sure if cough aggrevates his throat. Pt wants to know if this is normal. He is in no pain and no other symptoms.   pt had a Bronch 5/28 and he is coughing, coughing up a little bit of blood.pt want's to know if that is normal.    By Dewayne Shorter, CPhT

## 2019-05-29 NOTE — Telephone Encounter (Signed)
He coughed especially hard last night and has brought up small amt of red blood. 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 Calf now feels normal and he is walking normally.  I suggested he stop Xarelto for now and discuss w Dr Julien Nordmann at [facility] appointment next week. Avoid stasis. Hydrocodone no longer works better for cough than otcs, so I suggested he use Nyquil, Delsym and call us if needed.

## 2019-06-03 ENCOUNTER — Encounter (HOSPITAL_COMMUNITY): Payer: Self-pay | Admitting: Internal Medicine

## 2019-06-04 ENCOUNTER — Inpatient Hospital Stay: Payer: Medicare Other

## 2019-06-04 ENCOUNTER — Other Ambulatory Visit: Payer: Self-pay

## 2019-06-04 ENCOUNTER — Inpatient Hospital Stay: Payer: Medicare Other | Attending: Internal Medicine | Admitting: Internal Medicine

## 2019-06-04 ENCOUNTER — Other Ambulatory Visit: Payer: Self-pay | Admitting: *Deleted

## 2019-06-04 ENCOUNTER — Encounter: Payer: Self-pay | Admitting: *Deleted

## 2019-06-04 ENCOUNTER — Ambulatory Visit
Admission: RE | Admit: 2019-06-04 | Discharge: 2019-06-04 | Disposition: A | Discharge status: Home / Self Care | Payer: Medicare Other | Source: Ambulatory Visit | Attending: Radiation Oncology | Admitting: Radiation Oncology

## 2019-06-04 ENCOUNTER — Telehealth: Payer: Self-pay | Admitting: Internal Medicine

## 2019-06-04 VITALS — BP 127/81 | HR 116 | Temp 98.7°F | Resp 18 | Ht 70.0 in | Wt 149.1 lb

## 2019-06-04 DIAGNOSIS — Z79899 Other long term (current) drug therapy: Secondary | ICD-10-CM | POA: Insufficient documentation

## 2019-06-04 DIAGNOSIS — Z5111 Encounter for antineoplastic chemotherapy: Secondary | ICD-10-CM | POA: Diagnosis not present

## 2019-06-04 DIAGNOSIS — R590 Localized enlarged lymph nodes: Secondary | ICD-10-CM | POA: Diagnosis not present

## 2019-06-04 DIAGNOSIS — Z5112 Encounter for antineoplastic immunotherapy: Secondary | ICD-10-CM | POA: Diagnosis not present

## 2019-06-04 DIAGNOSIS — R51 Headache: Secondary | ICD-10-CM | POA: Insufficient documentation

## 2019-06-04 DIAGNOSIS — K219 Gastro-esophageal reflux disease without esophagitis: Secondary | ICD-10-CM | POA: Insufficient documentation

## 2019-06-04 DIAGNOSIS — R5382 Chronic fatigue, unspecified: Secondary | ICD-10-CM

## 2019-06-04 DIAGNOSIS — E785 Hyperlipidemia, unspecified: Secondary | ICD-10-CM | POA: Diagnosis not present

## 2019-06-04 DIAGNOSIS — E538 Deficiency of other specified B group vitamins: Secondary | ICD-10-CM | POA: Insufficient documentation

## 2019-06-04 DIAGNOSIS — Z86718 Personal history of other venous thrombosis and embolism: Secondary | ICD-10-CM | POA: Diagnosis not present

## 2019-06-04 DIAGNOSIS — R930 Abnormal findings on diagnostic imaging of skull and head, not elsewhere classified: Secondary | ICD-10-CM | POA: Diagnosis not present

## 2019-06-04 DIAGNOSIS — Z87891 Personal history of nicotine dependence: Secondary | ICD-10-CM | POA: Diagnosis not present

## 2019-06-04 DIAGNOSIS — C3491 Malignant neoplasm of unspecified part of right bronchus or lung: Secondary | ICD-10-CM | POA: Diagnosis not present

## 2019-06-04 DIAGNOSIS — Z7901 Long term (current) use of anticoagulants: Secondary | ICD-10-CM | POA: Diagnosis not present

## 2019-06-04 DIAGNOSIS — Z9889 Other specified postprocedural states: Secondary | ICD-10-CM

## 2019-06-04 DIAGNOSIS — R918 Other nonspecific abnormal finding of lung field: Secondary | ICD-10-CM

## 2019-06-04 DIAGNOSIS — Z7189 Other specified counseling: Secondary | ICD-10-CM

## 2019-06-04 DIAGNOSIS — C3481 Malignant neoplasm of overlapping sites of right bronchus and lung: Secondary | ICD-10-CM | POA: Insufficient documentation

## 2019-06-04 DIAGNOSIS — I82401 Acute embolism and thrombosis of unspecified deep veins of right lower extremity: Secondary | ICD-10-CM

## 2019-06-04 DIAGNOSIS — C3490 Malignant neoplasm of unspecified part of unspecified bronchus or lung: Secondary | ICD-10-CM

## 2019-06-04 DIAGNOSIS — Z7982 Long term (current) use of aspirin: Secondary | ICD-10-CM

## 2019-06-04 LAB — CBC WITH DIFFERENTIAL (CANCER CENTER ONLY)
Abs Immature Granulocytes: 0.05 10*3/uL (ref 0.00–0.07)
Basophils Absolute: 0.1 10*3/uL (ref 0.0–0.1)
Basophils Relative: 1 %
Eosinophils Absolute: 0.1 10*3/uL (ref 0.0–0.5)
Eosinophils Relative: 2 %
HCT: 40.8 % (ref 39.0–52.0)
Hemoglobin: 12.8 g/dL — ABNORMAL LOW (ref 13.0–17.0)
Immature Granulocytes: 1 %
Lymphocytes Relative: 11 %
Lymphs Abs: 1 10*3/uL (ref 0.7–4.0)
MCH: 27 pg (ref 26.0–34.0)
MCHC: 31.4 g/dL (ref 30.0–36.0)
MCV: 86.1 fL (ref 80.0–100.0)
Monocytes Absolute: 0.8 10*3/uL (ref 0.1–1.0)
Monocytes Relative: 9 %
Neutro Abs: 6.9 10*3/uL (ref 1.7–7.7)
Neutrophils Relative %: 76 %
Platelet Count: 183 10*3/uL (ref 150–400)
RBC: 4.74 MIL/uL (ref 4.22–5.81)
RDW: 13.1 % (ref 11.5–15.5)
WBC Count: 8.9 10*3/uL (ref 4.0–10.5)
nRBC: 0 % (ref 0.0–0.2)

## 2019-06-04 LAB — CMP (CANCER CENTER ONLY)
ALT: 34 U/L (ref 0–44)
AST: 19 U/L (ref 15–41)
Albumin: 3.3 g/dL — ABNORMAL LOW (ref 3.5–5.0)
Alkaline Phosphatase: 112 U/L (ref 38–126)
Anion gap: 10 (ref 5–15)
BUN: 16 mg/dL (ref 8–23)
CO2: 25 mmol/L (ref 22–32)
Calcium: 8.4 mg/dL — ABNORMAL LOW (ref 8.9–10.3)
Chloride: 104 mmol/L (ref 98–111)
Creatinine: 0.85 mg/dL (ref 0.61–1.24)
GFR, Est AFR Am: >60 mL/min (ref >60)
GFR, Estimated: >60 mL/min (ref >60)
Glucose, Bld: 109 mg/dL — ABNORMAL HIGH (ref 70–99)
Potassium: 3.9 mmol/L (ref 3.5–5.1)
Sodium: 139 mmol/L (ref 135–145)
Total Bilirubin: 0.2 mg/dL — ABNORMAL LOW (ref 0.3–1.2)
Total Protein: 6.8 g/dL (ref 6.5–8.1)

## 2019-06-04 MED ORDER — CYANOCOBALAMIN 1000 MCG/ML IJ SOLN
1000.0000 ug | Freq: Once | INTRAMUSCULAR | Status: AC
  Administered 2019-06-04: 1000 ug via INTRAMUSCULAR

## 2019-06-04 MED ORDER — CYANOCOBALAMIN 1000 MCG/ML IJ SOLN
INTRAMUSCULAR | Status: AC
  Filled 2019-06-04: qty 1

## 2019-06-04 NOTE — Telephone Encounter (Signed)
  Attempted to return call but no answer.  Left voicemail to call us.

## 2019-06-04 NOTE — Progress Notes (Signed)
[place] Telephone:([phone]) [phone]   Fax:(336) [phone] Multidisciplinary thoracic oncology clinic  CONSULT NOTE  REFERRING PHYSICIAN: Dr. Baltazar Apo  REASON FOR CONSULTATION:  68 years old white male recently diagnosed with lung cancer.  HPI Kyle Garcia is a 68 y.o. male with past medical history significant for GERD, dyslipidemia, cataract surgery as well as showed history for smoking and quit 35 years ago.  The patient mentions that since February 2020 has been complaining of cough as well as shortness of breath and weakness.  He also had fever for a week.  He tried over-the-counter cough medication with no improvement.  He was seen by his primary care physician and was given prescription for antibiotics as well as steroids.  Again there was no improvement in his condition.  He was referred to Dr. Annamaria Boots for evaluation and his condition was getting worse.  The patient referred to the emergency department for evaluation.  Chest x-ray on 04/13/2019 showed questionable pneumonia of the right lower lobe.  CT angiogram of the chest performed on 04/14/2019 showed no evidence pulmonary embolism.  The scan showed interstitial and airspace disease in the right lung particularly in the superior segment of the right lower lobe in the posterior aspect of the right middle lobe.  The findings were suggestive for multifocal pneumonia.  There was a small amount of disease in the left lung.  The scan also showed bilateral hilar lymphadenopathy with mild mediastinal lymphadenopathy and mildly enlarged left supraclavicular lymph nodes.  A PET scan was performed on 05/06/2019 and it showed progressive airspace disease with consolidation in the right lower lobe with peripheral nodularity.  There is a moderate to high hypermetabolic activity associated with this progressive nodular airspace process.  The PET scan also showed hypermetabolic bilateral hilar and mediastinal lymph nodes.  There was also  hypermetabolic a small left supraclavicular node.  The combination of the findings is concerning for progressive chronic inflammatory process but malignancy could not be excluded. The patient was seen by Dr. Lamonte Sakai and he underwent a bronchoscopy on 05/21/2019 and the final pathology of the right lower lobe transbronchial biopsy ([id number]) showed adenocarcinoma. The neoplastic cells are positive for TTF-1 but negative for cytokeratin 5/6 and p63.  The [facility] for molecular studies and PDL 1 expression.  PDL 1 expression was reported to be 50% but the other molecular studies are still pending. Dr. Lamonte Sakai kindly referred the patient to me today for evaluation and recommendation regarding treatment of his condition. When seen today the patient continues to complain of dry cough but no significant chest pain or hemoptysis.  He continues to have shortness of breath with exertion.  He denied having any nausea, vomiting, diarrhea or constipation.  He lost around 25 pounds in the last 4 months.  He continues to complain of headache but no visual changes.  He also has low-grade fever.  He was tested for COVID-19 on 05/19/2019 and the test was negative. Family history significant for father with COPD and mother died at age 73 of natural causes. The patient is married and has 3 children and 8 grandchildren.  He is to work as a Therapist, sports.  He has a history for smoking for around 12 years and quit 35 years ago.  He drinks alcohol occasionally and no history of drug abuse.  HPI  Past Medical History:  Diagnosis Date   GERD (gastroesophageal reflux disease)    Hyperlipidemia  Past Surgical History:  Procedure Laterality Date   CATARACT EXTRACTION  2016   EYE SURGERY     VIDEO BRONCHOSCOPY Bilateral 05/21/2019   Procedure: VIDEO BRONCHOSCOPY WITH FLUORO;  Surgeon: Collene Gobble, MD;  Location: [place];  Service: Cardiopulmonary;  Laterality:  Bilateral;    No family history on file.  Social History Social History   Tobacco Use   Smoking status: Former Smoker    Types: Cigars    Quit date: 02/07/2019    Years since quitting: 0.3   Smokeless tobacco: Never Used   Tobacco comment: occassional cigar/stopped ciggs in 1981; as of 04/28/2019, rarely smokes  Substance Use Topics   Alcohol use: Yes    Alcohol/week: 4.0 - 7.0 standard drinks    Types: 1 - 3 Glasses of wine, 3 - 4 Cans of beer per week   Drug use: Yes    No Known Allergies  Current Outpatient Medications  Medication Sig Dispense Refill   acetaminophen (TYLENOL) 500 MG tablet Take 1,000 mg by mouth every 6 (six) hours as needed for headache.     aspirin-acetaminophen-caffeine (EXCEDRIN EXTRA STRENGTH) 250-250-65 MG tablet Take 2 tablets by mouth every 6 (six) hours as needed for headache.     benzonatate (TESSALON) 200 MG capsule Take 1 capsule (200 mg total) by mouth 3 (three) times daily as needed for cough. 30 capsule 1   chlorpheniramine-HYDROcodone (TUSSIONEX PENNKINETIC ER) 10-8 MG/5ML SUER 5 ml by mouth every 12 hours if needed for cough 200 mL 0   HYDROcodone-homatropine (HYCODAN) 5-1.5 MG/5ML syrup Take 5 mLs by mouth every 6 (six) hours as needed for cough. 240 mL 0   Multiple Vitamins-Minerals (MULTIVITAMIN WITH MINERALS) tablet Take 1 tablet by mouth daily.      Multiple Vitamins-Minerals (PRESERVISION AREDS 2) CAPS Take 1 capsule by mouth 2 (two) times daily.     omeprazole (PRILOSEC) 40 MG capsule Take 1 capsule (40 mg total) by mouth daily. 90 capsule 3   Polyvinyl Alcohol-Povidone (REFRESH OP) Place 1 drop into both eyes every evening.     pravastatin (PRAVACHOL) 20 MG tablet TAKE 1 TABLET DAILY AT 6 PM (Patient taking differently: Take 20 mg by mouth daily at 6 PM. ) 90 tablet 1   rivaroxaban (XARELTO) 20 MG TABS tablet Take 1 tablet (20 mg total) by mouth daily with supper. OK to restart on Saturday 05/23/2019. 30 tablet 3   No  current facility-administered medications for this visit.     Review of Systems  Constitutional: positive for fatigue and weight loss Eyes: negative Ears, nose, mouth, throat, and face: negative Respiratory: positive for cough and dyspnea on exertion Cardiovascular: negative Gastrointestinal: negative Genitourinary:negative Integument/breast: negative Hematologic/lymphatic: negative Musculoskeletal:negative Neurological: positive for headaches Behavioral/Psych: negative Endocrine: negative Allergic/Immunologic: negative  Physical Exam  YCX:KGYJE, healthy, no distress, well nourished, well developed and anxious SKIN: skin color, texture, turgor are normal, no rashes or significant lesions HEAD: Normocephalic, No masses, lesions, tenderness or abnormalities EYES: normal, PERRLA, Conjunctiva are pink and non-injected EARS: External ears normal, Canals clear OROPHARYNX:no exudate, no erythema and lips, buccal mucosa, and tongue normal  NECK: supple, no adenopathy, no JVD LYMPH:  no palpable lymphadenopathy, no hepatosplenomegaly LUNGS: coarse sounds heard, decreased breath sounds HEART: regular rate & rhythm, no murmurs and no gallops ABDOMEN:abdomen soft, non-tender, normal bowel sounds and no masses or organomegaly BACK: No CVA tenderness, Range of motion is normal EXTREMITIES:no joint deformities, effusion, or inflammation, no edema  NEURO: alert & oriented x 3  with fluent speech, no focal motor/sensory deficits  PERFORMANCE STATUS: ECOG 1  LABORATORY DATA: Lab Results  Component Value Date   WBC 7.0 04/28/2019   HGB 13.3 04/28/2019   HCT 39.8 04/28/2019   MCV 85.5 04/28/2019   PLT 245.0 04/28/2019      Chemistry      Component Value Date/Time   NA 136 04/28/2019 1307   K 4.0 04/28/2019 1307   CL 100 04/28/2019 1307   CO2 28 04/28/2019 1307   BUN 14 04/28/2019 1307   CREATININE 0.88 04/28/2019 1307   CREATININE 0.85 12/02/2018 0906      Component Value  Date/Time   CALCIUM 8.2 (L) 04/28/2019 1307   ALKPHOS 65 04/28/2019 1307   AST 13 04/28/2019 1307   ALT 12 04/28/2019 1307   BILITOT 0.4 04/28/2019 1307       RADIOGRAPHIC STUDIES: Nm Pet Image Initial (pi) Skull Base To Thigh  Result Date: 05/07/2019 CLINICAL DATA:  Initial treatment strategy for tumor type. EXAM: NUCLEAR MEDICINE PET SKULL BASE TO THIGH TECHNIQUE: mCi F-18 FDG was injected intravenously. Full-ring PET imaging was performed from the skull base to thigh after the radiotracer. CT data was obtained and used for attenuation correction and anatomic localization. Fasting blood glucose:  mg/dl COMPARISON:  None. FINDINGS: Mediastinal blood pool activity: SUV max Liver activity: SUV max NA NECK: No hypermetabolic lymph nodes in the neck. Incidental CT findings: none CHEST: There is branching nodular airspace disease in the RIGHT lower lobe with central peribronchial thickening and air bronchograms. By CT imaging this process is increased in severity. There is a patchy hypermetabolic activity within this RIGHT lobe process peripherally with SUV max up to 7.0. Discrete nodule in the RIGHT upper lobe measuring 1.6 cm has low metabolic activity SUV max equal 2.5. This nodule is increased in density compared to prior also. Hypermetabolic LEFT hilar lymph node with SUV max equal 9.5. Node difficult to define on the noncontrast exam but measures approximately 12 mm. Smaller hypermetabolic mediastinal lymph nodes include prevascular lymph node with SUV max equal 5.2 and a RIGHT hilar lymph node with SUV max equal 4.2 Small LEFT supraclavicular node measuring 8 mm short axis (image 55/4) has high activity for size with SUV max equal 4.9. Incidental CT findings: small RIGHT effusion is increased. ABDOMEN/PELVIS: No abnormal hypermetabolic activity within the liver, pancreas, adrenal glands, or spleen. No hypermetabolic lymph nodes in the abdomen or pelvis. Incidental CT findings: none SKELETON: No focal  hypermetabolic activity to suggest skeletal metastasis. Incidental CT findings: none IMPRESSION: 1. Progressive airspace disease and consolidation in the RIGHT lower lobe with peripheral nodularity. There is a moderate to high metabolic activity associated with this progressive nodular airspace process. 2. Hypermetabolic bilateral hilar and mediastinal lymph nodes. Hypermetabolic small LEFT supraclavicular node. These nodes are relatively small but intensely metabolic for size. 3. This combination of findings is concerning for progressive chronic inflammatory or progressive infectious process; however cannot exclude a malignant process. Consider RIGHT lower lobe bronchoscopy or potentially percutaneous sampling of the LEFT supraclavicular node. These results will be called to the ordering clinician or representative by the Radiologist Assistant, and communication documented in the PACS or zVision Dashboard. Electronically Signed   By: Suzy Bouchard M.D.   On: 05/07/2019 08:52   Dg Chest Port 1 View  Result Date: 05/21/2019 CLINICAL DATA:  Bronchoscopy.  Pneumonia EXAM: PORTABLE CHEST 1 VIEW COMPARISON:  PET-CT 05/06/2019, chest radiograph 04/27/2019 FINDINGS: Normal cardiac silhouette. Airspace disease in the RIGHT  lower lobe similar pattern to 04/27/2019 with mild increase in density. No LEFT lung airspace disease. No pneumothorax. IMPRESSION: 1. Increased density of RIGHT lower lobe airspace disease. 2. No pneumothorax.  No pleural fluid. Electronically Signed   By: Suzy Bouchard M.D.   On: 05/21/2019 14:45   Dg C-arm Bronchoscopy  Result Date: 05/21/2019 C-ARM BRONCHOSCOPY: Fluoroscopy was utilized by the requesting physician.  No radiographic interpretation.    ASSESSMENT: This is a very pleasant 68 years old white male with likely a stage IIIc/IV (T3, N3, M1a) non-small cell lung cancer, adenocarcinoma presented with right lower lobe consolidative and infiltrative process with suspicious  lymphangitic spread as well as bilateral hilar and mediastinal lymphadenopathy and left supraclavicular lymphadenopathy diagnosed in May 2020. PDL 1 expression 50%.  Molecular studies are still pending  PLAN: I had a lengthy discussion with the patient and his wife was available by phone and his current disease stage, prognosis and treatment options. I recommended for the patient to complete the staging work-up by ordering MRI of the brain to rule out brain metastasis. I will also send blood sample to [facility] for molecular studies in addition to waiting for the [facility] report. I explained to the patient that he has incurable condition and or the treatment will be of palliative nature. I gave the patient the option of palliative care versus palliative systemic chemotherapy with carboplatin for AUC of 5, Alimta 500 mg/M2 and Keytruda 200 mg IV every 3 weeks as the patient has no actionable mutation. I discussed with the patient the adverse effect of this treatment including but not limited to alopecia, myelosuppression, nausea and vomiting, peripheral neuropathy, liver or renal dysfunction as well as immunotherapy adverse effects. The patient is expected to start the first cycle of this treatment on June 16, 2019 immediately after the availability of the molecular studies if negative. He will receive vitamin B12 injection today. I will send prescription for folic acid 1 mg p.o. daily as well as Compazine 10 mg p.o. every 6 hours as needed for nausea to his pharmacy. I will arrange for the patient to have a chemotherapy education class before the principles of his treatment. He will come back for follow-up visit on June 16, 2019 before starting the first dose of his treatment. For the dry cough, he will continue over-the-counter Delsym medication and I offered the patient Hycodan if needed. He was advised to call immediately if he has any concerning symptoms in the interval. The patient voices  understanding of current disease status and treatment options and is in agreement with the current care plan.  All questions were answered. The patient knows to call the clinic with any problems, questions or concerns. We can certainly see the patient much sooner if necessary.  Thank you so much for allowing me to participate in the care of [employer]. I will continue to follow up the patient with you and assist in his care.  I spent 55 minutes counseling the patient face to face. The total time spent in the appointment was 80 minutes.  Disclaimer: This note was dictated with voice recognition software. Similar sounding words can inadvertently be transcribed and may not be corrected upon review.   Eilleen Kempf June 04, 2019, 2:30 PM

## 2019-06-04 NOTE — Progress Notes (Signed)
START ON PATHWAY REGIMEN - Non-Small Cell Lung     A cycle is every 21 days:     Pemetrexed      Carboplatin   **Always confirm dose/schedule in your pharmacy ordering system**  Patient Characteristics: Stage IV Metastatic, Nonsquamous, Initial Chemotherapy/Immunotherapy, PS = 0, 1, ALK or EGFR or ROS1 or NTRK Genomic Alterations - Awaiting Test Results AJCC T Category: T3 Current Disease Status: Distant Metastases AJCC N Category: N3 AJCC M Category: M1a AJCC 8 Stage Grouping: IVA Histology: Nonsquamous Cell ROS1 Rearrangement Status: Awaiting Test Results T790M Mutation Status: Not Applicable - EGFR Mutation Negative/Unknown Other Mutations/Biomarkers: No Other Actionable Mutations NTRK Gene Fusion Status: Awaiting Test Results PD-L1 Expression Status: PD-L1 Positive ? 50% (TPS) Chemotherapy/Immunotherapy LOT: Initial Chemotherapy/Immunotherapy Molecular Targeted Therapy: Not Appropriate ALK Rearrangement Status: Awaiting Test Results EGFR Mutation Status: Awaiting Test Results BRAF V600E Mutation Status: Awaiting Test Results ECOG Performance Status: 1 Intent of Therapy: Non-Curative / Palliative Intent, Discussed with Patient

## 2019-06-04 NOTE — Progress Notes (Signed)
The proposed treatment discussed at cancer conference 06/04/2019 is for discussion purpose only and is not a binding recommendation.  The patient was not physically examined nor present for their treatment options.  Therefore, final treatment plans cannot be decided.

## 2019-06-05 ENCOUNTER — Encounter: Payer: Self-pay | Admitting: Internal Medicine

## 2019-06-05 DIAGNOSIS — Z5112 Encounter for antineoplastic immunotherapy: Secondary | ICD-10-CM | POA: Insufficient documentation

## 2019-06-05 DIAGNOSIS — Z7189 Other specified counseling: Secondary | ICD-10-CM | POA: Insufficient documentation

## 2019-06-05 DIAGNOSIS — Z5111 Encounter for antineoplastic chemotherapy: Secondary | ICD-10-CM | POA: Insufficient documentation

## 2019-06-05 NOTE — Telephone Encounter (Signed)
Pt returning call a/b appoint  And can be reached @ [phone].Kyle Garcia

## 2019-06-05 NOTE — Telephone Encounter (Signed)
Contacted patient by phone and advised per Dr. Annamaria Boots that we will cancel 06/08/19 appointment and patient may call us if and when he needs assistance from our office or for future appointments.  Patient acknowledged understanding.  Nothing further needed.

## 2019-06-05 NOTE — Telephone Encounter (Signed)
I suggest for now he cancel this appointment, and see [place] in the future as needed I hope he does well

## 2019-06-05 NOTE — Telephone Encounter (Signed)
Dr. Annamaria Boots does pt still need to come to his appointment with you on 06/08/2019? He saw an oncologist yesterday. Please advise.   Assessment & Plan Note by Deneise Lever, MD at 05/08/2019 9:57 AM Author: Deneise Lever, MD Author Type: Physician Filed: 05/08/2019 10:02 AM  Note Status: Written Cosign: Cosign Not Required Encounter Date: 05/08/2019  Problem: Pneumonia involving right lung  Editor: Deneise Lever, MD (Physician)    Today temp 99.2, but WBC remains normal, Cough minimally productive now- we will try to culture- giving cup.  As Covid allows, he needs bronchoscopy for BAL and possible TBBX vs needle bx L supraclavicular node. I favor repeated aspiration pneumonia, although he doesn't recognize obvious events, vs atypical organism. We will try one more round prednisone with augmentin, but he noted little lasting benefit from prior antibiotics or prednisone course.  I am asking Dr Lamonte Sakai to review.

## 2019-06-08 ENCOUNTER — Telehealth: Payer: Self-pay | Admitting: *Deleted

## 2019-06-08 ENCOUNTER — Telehealth: Payer: Self-pay | Admitting: Internal Medicine

## 2019-06-08 ENCOUNTER — Telehealth: Payer: Self-pay | Admitting: Medical Oncology

## 2019-06-08 ENCOUNTER — Other Ambulatory Visit: Payer: Self-pay | Admitting: Internal Medicine

## 2019-06-08 ENCOUNTER — Ambulatory Visit: Payer: Medicare Other | Admitting: Internal Medicine

## 2019-06-08 MED ORDER — PROCHLORPERAZINE MALEATE 10 MG PO TABS: 10.0000 mg | ORAL_TABLET | Freq: Four times a day (QID) | ORAL | 0 refills | Status: DC | PRN

## 2019-06-08 MED ORDER — FOLIC ACID 1 MG PO TABS: 1.0000 mg | ORAL_TABLET | Freq: Every day | ORAL | 4 refills | Status: DC

## 2019-06-08 NOTE — Telephone Encounter (Signed)
Chemo supportive meds not ordered-Folic acid , compazine and ?decadron. Message to [place].

## 2019-06-08 NOTE — Telephone Encounter (Signed)
Received call from patient inquiring about when his brain MRI will be. He is a new patient as of 06/04/19.  Advised that it takes afew days to get insurance authorization and then get it scheduled. Assured him that he would be contacted soon about getting it scheduled.  Pt voiced understanding.

## 2019-06-08 NOTE — Telephone Encounter (Signed)
Done

## 2019-06-08 NOTE — Telephone Encounter (Signed)
Scheduled appt per 6/11 los - pt is aware of appt date and time

## 2019-06-09 ENCOUNTER — Other Ambulatory Visit: Payer: Self-pay

## 2019-06-09 ENCOUNTER — Inpatient Hospital Stay: Payer: Medicare Other

## 2019-06-10 ENCOUNTER — Ambulatory Visit (HOSPITAL_COMMUNITY)
Admission: RE | Admit: 2019-06-10 | Discharge: 2019-06-10 | Disposition: A | Discharge status: Home / Self Care | Payer: Medicare Other | Source: Ambulatory Visit | Attending: Internal Medicine | Admitting: Internal Medicine

## 2019-06-10 DIAGNOSIS — G9389 Other specified disorders of brain: Secondary | ICD-10-CM | POA: Diagnosis not present

## 2019-06-10 DIAGNOSIS — C3491 Malignant neoplasm of unspecified part of right bronchus or lung: Secondary | ICD-10-CM | POA: Diagnosis not present

## 2019-06-10 DIAGNOSIS — C3490 Malignant neoplasm of unspecified part of unspecified bronchus or lung: Secondary | ICD-10-CM | POA: Insufficient documentation

## 2019-06-10 MED ORDER — GADOBUTROL 1 MMOL/ML IV SOLN
7.0000 mL | Freq: Once | INTRAVENOUS | Status: AC | PRN
  Administered 2019-06-10: 7 mL via INTRAVENOUS

## 2019-06-12 ENCOUNTER — Telehealth: Payer: Self-pay | Admitting: Medical Oncology

## 2019-06-12 ENCOUNTER — Encounter: Payer: Self-pay | Admitting: *Deleted

## 2019-06-12 ENCOUNTER — Other Ambulatory Visit: Payer: Self-pay | Admitting: Medical Oncology

## 2019-06-12 DIAGNOSIS — R05 Cough: Secondary | ICD-10-CM

## 2019-06-12 DIAGNOSIS — R053 Chronic cough: Secondary | ICD-10-CM

## 2019-06-12 MED ORDER — METHYLPREDNISOLONE 4 MG PO TBPK: ORAL_TABLET | ORAL | 0 refills | Status: DC

## 2019-06-12 NOTE — Progress Notes (Signed)
Oncology Nurse Navigator Documentation  Oncology Nurse Navigator Flowsheets 06/12/2019  Navigator Location [facility]  Referral Date to RadOnc/MedOnc -  Navigator Encounter Type Clinic/MDC/I spoke with patient at thoracic clinic on 06/04/2019.  He is newly DX lung cancer with TX plan systemic therapy and XRT.  I gave and explained information on next steps.  I contacted authorization to get MRI brain authorized and scheduled.   Telephone -  Abnormal Finding Date -  Confirmed Diagnosis Date -  Multidisiplinary Clinic Date 06/04/2019  Treatment Initiated Date 06/17/2019  Patient Visit Type MedOnc  Treatment Phase Pre-Tx/Tx Discussion  Barriers/Navigation Needs Coordination of Care;Education  Education Newly Diagnosed Cancer Education;Other  Interventions Coordination of Care;Education  Coordination of Care Other  Education Method Verbal;Written  Acuity Level 3  Time Spent with Patient 45

## 2019-06-12 NOTE — Telephone Encounter (Signed)
Pt notified and medrol dose pak called to preferred pharmacy.

## 2019-06-12 NOTE — Telephone Encounter (Signed)
1) MRI showed some few concerning findings, better discussed in person when I see him next week. 2) No stronger cough medications than what he has. We can give a short course of steroids to see if it will help. Medrol Dose pak. Cough is related to his cancer and will not improve unless the cancer gets better with treatment which is expected to start next week.

## 2019-06-12 NOTE — Telephone Encounter (Signed)
LVM to return call.

## 2019-06-12 NOTE — Telephone Encounter (Signed)
1. MRI results requested.  2 Requests stronger cough medication- He is having increased coughing to point it makes his left chest and back hurt.    Delysm not helping and Hycodan just made him sleep and when he woke up cough was worse.

## 2019-06-15 ENCOUNTER — Encounter (HOSPITAL_COMMUNITY): Payer: Self-pay

## 2019-06-15 ENCOUNTER — Other Ambulatory Visit: Payer: Self-pay | Admitting: Radiation Therapy

## 2019-06-16 ENCOUNTER — Encounter: Payer: Self-pay | Admitting: Internal Medicine

## 2019-06-16 NOTE — Progress Notes (Signed)
Called pt to introduce myself as his Arboriculturist.  Pt has 2 insurances so copay assistance shouldn't be needed.  I offered the [place], went over what it covers and gave him the income requirement.  He informed me that he exceeds the requirement so he wouldn't qualify for the grant at this time.  I will give him my card on 06/17/19 for any questions or concerns he may have in the future.

## 2019-06-17 ENCOUNTER — Encounter: Payer: Self-pay | Admitting: Physician Assistant

## 2019-06-17 ENCOUNTER — Inpatient Hospital Stay (HOSPITAL_BASED_OUTPATIENT_CLINIC_OR_DEPARTMENT_OTHER): Payer: Medicare Other | Admitting: Internal Medicine

## 2019-06-17 ENCOUNTER — Other Ambulatory Visit: Payer: Self-pay

## 2019-06-17 ENCOUNTER — Inpatient Hospital Stay: Payer: Medicare Other

## 2019-06-17 ENCOUNTER — Inpatient Hospital Stay (HOSPITAL_BASED_OUTPATIENT_CLINIC_OR_DEPARTMENT_OTHER): Payer: Medicare Other | Admitting: Physician Assistant

## 2019-06-17 VITALS — BP 140/83 | HR 107 | Temp 98.6°F | Resp 19 | Ht 70.0 in | Wt 146.7 lb

## 2019-06-17 VITALS — HR 99

## 2019-06-17 DIAGNOSIS — R5383 Other fatigue: Secondary | ICD-10-CM

## 2019-06-17 DIAGNOSIS — C3491 Malignant neoplasm of unspecified part of right bronchus or lung: Secondary | ICD-10-CM

## 2019-06-17 DIAGNOSIS — R590 Localized enlarged lymph nodes: Secondary | ICD-10-CM

## 2019-06-17 DIAGNOSIS — Z86718 Personal history of other venous thrombosis and embolism: Secondary | ICD-10-CM

## 2019-06-17 DIAGNOSIS — E538 Deficiency of other specified B group vitamins: Secondary | ICD-10-CM

## 2019-06-17 DIAGNOSIS — R5382 Chronic fatigue, unspecified: Secondary | ICD-10-CM

## 2019-06-17 DIAGNOSIS — C3481 Malignant neoplasm of overlapping sites of right bronchus and lung: Secondary | ICD-10-CM

## 2019-06-17 DIAGNOSIS — R930 Abnormal findings on diagnostic imaging of skull and head, not elsewhere classified: Secondary | ICD-10-CM

## 2019-06-17 DIAGNOSIS — Z5111 Encounter for antineoplastic chemotherapy: Secondary | ICD-10-CM

## 2019-06-17 DIAGNOSIS — Z7901 Long term (current) use of anticoagulants: Secondary | ICD-10-CM

## 2019-06-17 DIAGNOSIS — Z87891 Personal history of nicotine dependence: Secondary | ICD-10-CM

## 2019-06-17 DIAGNOSIS — Z7982 Long term (current) use of aspirin: Secondary | ICD-10-CM

## 2019-06-17 DIAGNOSIS — R51 Headache: Secondary | ICD-10-CM | POA: Diagnosis not present

## 2019-06-17 DIAGNOSIS — Z5112 Encounter for antineoplastic immunotherapy: Secondary | ICD-10-CM

## 2019-06-17 DIAGNOSIS — K219 Gastro-esophageal reflux disease without esophagitis: Secondary | ICD-10-CM

## 2019-06-17 DIAGNOSIS — Z79899 Other long term (current) drug therapy: Secondary | ICD-10-CM | POA: Diagnosis not present

## 2019-06-17 DIAGNOSIS — E785 Hyperlipidemia, unspecified: Secondary | ICD-10-CM

## 2019-06-17 DIAGNOSIS — I639 Cerebral infarction, unspecified: Secondary | ICD-10-CM | POA: Insufficient documentation

## 2019-06-17 DIAGNOSIS — R9089 Other abnormal findings on diagnostic imaging of central nervous system: Secondary | ICD-10-CM

## 2019-06-17 LAB — CBC WITH DIFFERENTIAL (CANCER CENTER ONLY)
Abs Immature Granulocytes: 0.14 10*3/uL — ABNORMAL HIGH (ref 0.00–0.07)
Basophils Absolute: 0.1 10*3/uL (ref 0.0–0.1)
Basophils Relative: 1 %
Eosinophils Absolute: 0 10*3/uL (ref 0.0–0.5)
Eosinophils Relative: 0 %
HCT: 42.4 % (ref 39.0–52.0)
Hemoglobin: 13.2 g/dL (ref 13.0–17.0)
Immature Granulocytes: 1 %
Lymphocytes Relative: 4 %
Lymphs Abs: 0.5 10*3/uL — ABNORMAL LOW (ref 0.7–4.0)
MCH: 26.6 pg (ref 26.0–34.0)
MCHC: 31.1 g/dL (ref 30.0–36.0)
MCV: 85.5 fL (ref 80.0–100.0)
Monocytes Absolute: 0.9 10*3/uL (ref 0.1–1.0)
Monocytes Relative: 7 %
Neutro Abs: 11.1 10*3/uL — ABNORMAL HIGH (ref 1.7–7.7)
Neutrophils Relative %: 87 %
Platelet Count: 183 10*3/uL (ref 150–400)
RBC: 4.96 MIL/uL (ref 4.22–5.81)
RDW: 14 % (ref 11.5–15.5)
WBC Count: 12.7 10*3/uL — ABNORMAL HIGH (ref 4.0–10.5)
nRBC: 0 % (ref 0.0–0.2)

## 2019-06-17 LAB — CMP (CANCER CENTER ONLY)
ALT: 33 U/L (ref 0–44)
AST: 16 U/L (ref 15–41)
Albumin: 3.5 g/dL (ref 3.5–5.0)
Alkaline Phosphatase: 139 U/L — ABNORMAL HIGH (ref 38–126)
Anion gap: 11 (ref 5–15)
BUN: 15 mg/dL (ref 8–23)
CO2: 26 mmol/L (ref 22–32)
Calcium: 8.6 mg/dL — ABNORMAL LOW (ref 8.9–10.3)
Chloride: 103 mmol/L (ref 98–111)
Creatinine: 0.9 mg/dL (ref 0.61–1.24)
GFR, Est AFR Am: >60 mL/min (ref >60)
GFR, Estimated: >60 mL/min (ref >60)
Glucose, Bld: 99 mg/dL (ref 70–99)
Potassium: 4 mmol/L (ref 3.5–5.1)
Sodium: 140 mmol/L (ref 135–145)
Total Bilirubin: 0.4 mg/dL (ref 0.3–1.2)
Total Protein: 7 g/dL (ref 6.5–8.1)

## 2019-06-17 LAB — GUARDANT 360

## 2019-06-17 LAB — TSH: TSH: 0.499 u[IU]/mL (ref 0.320–4.118)

## 2019-06-17 MED ORDER — PALONOSETRON HCL INJECTION 0.25 MG/5ML
INTRAVENOUS | Status: AC
  Filled 2019-06-17: qty 5

## 2019-06-17 MED ORDER — EPINEPHRINE PF 1 MG/ML IJ SOLN: 0.5000 mg | Freq: Once | INTRAMUSCULAR | Status: DC | PRN

## 2019-06-17 MED ORDER — SODIUM CHLORIDE 0.9 % IV SOLN
Freq: Once | INTRAVENOUS | Status: DC | PRN
  Filled 2019-06-17: qty 250

## 2019-06-17 MED ORDER — SODIUM CHLORIDE 0.9 % IV SOLN
900.0000 mg | Freq: Once | INTRAVENOUS | Status: AC
  Administered 2019-06-17: 15:00:00 900 mg via INTRAVENOUS
  Filled 2019-06-17: qty 20

## 2019-06-17 MED ORDER — METHYLPREDNISOLONE SODIUM SUCC 125 MG IJ SOLR: 125.0000 mg | Freq: Once | INTRAMUSCULAR | Status: DC | PRN

## 2019-06-17 MED ORDER — ALBUTEROL SULFATE (2.5 MG/3ML) 0.083% IN NEBU
2.5000 mg | INHALATION_SOLUTION | Freq: Once | RESPIRATORY_TRACT | Status: DC | PRN
  Filled 2019-06-17: qty 3

## 2019-06-17 MED ORDER — EPINEPHRINE 1 MG/10ML IJ SOSY
0.2500 mg | PREFILLED_SYRINGE | Freq: Once | INTRAMUSCULAR | Status: DC | PRN
  Filled 2019-06-17: qty 10

## 2019-06-17 MED ORDER — SODIUM CHLORIDE 0.9 % IV SOLN
467.5000 mg | Freq: Once | INTRAVENOUS | Status: AC
  Administered 2019-06-17: 16:00:00 470 mg via INTRAVENOUS
  Filled 2019-06-17: qty 47

## 2019-06-17 MED ORDER — SODIUM CHLORIDE 0.9 % IV SOLN
Freq: Once | INTRAVENOUS | Status: AC
  Administered 2019-06-17: 13:00:00 via INTRAVENOUS
  Filled 2019-06-17: qty 5

## 2019-06-17 MED ORDER — FAMOTIDINE IN NACL 20-0.9 MG/50ML-% IV SOLN: 20.0000 mg | Freq: Once | INTRAVENOUS | Status: DC | PRN

## 2019-06-17 MED ORDER — DIPHENHYDRAMINE HCL 50 MG/ML IJ SOLN: 50.0000 mg | Freq: Once | INTRAMUSCULAR | Status: DC | PRN

## 2019-06-17 MED ORDER — RIVAROXABAN (XARELTO) VTE STARTER PACK (15 & 20 MG): ORAL_TABLET | ORAL | 0 refills | Status: DC

## 2019-06-17 MED ORDER — DIPHENHYDRAMINE HCL 50 MG/ML IJ SOLN: 25.0000 mg | Freq: Once | INTRAMUSCULAR | Status: DC | PRN

## 2019-06-17 MED ORDER — SODIUM CHLORIDE 0.9 % IV SOLN
Freq: Once | INTRAVENOUS | Status: AC
  Administered 2019-06-17: 14:00:00 via INTRAVENOUS
  Filled 2019-06-17: qty 250

## 2019-06-17 MED ORDER — PALONOSETRON HCL INJECTION 0.25 MG/5ML
0.2500 mg | Freq: Once | INTRAVENOUS | Status: AC
  Administered 2019-06-17: 0.25 mg via INTRAVENOUS

## 2019-06-17 MED ORDER — SODIUM CHLORIDE 0.9 % IV SOLN
200.0000 mg | Freq: Once | INTRAVENOUS | Status: AC
  Administered 2019-06-17: 15:00:00 200 mg via INTRAVENOUS
  Filled 2019-06-17: qty 8

## 2019-06-17 NOTE — Progress Notes (Signed)
[facility] [place], [place] [address] [phone]   New Patient Evaluation  Date of Service: 06/17/19 Patient Name: Kyle Garcia Patient MRN: [id number] Patient DOB: September 13, 1951 Provider: Ventura Sellers, MD  Identifying Statement:  Kyle Garcia is a 68 y.o. male with Abnormal brain MRI [R90.89] who presents for initial consultation and evaluation regarding cancer associated neurologic deficits.    Referring Provider: Susy Frizzle, MD [address] [facility] [place],  [place] [address]  Primary Cancer:  Oncologic History: Oncology History  Adenocarcinoma of right lung, stage 4 ([place])  06/04/2019 Initial Diagnosis   Adenocarcinoma of right lung, stage 4 ([place])   06/17/2019 -  Chemotherapy   The patient had palonosetron (ALOXI) injection 0.25 mg, 0.25 mg, Intravenous,  Once, 1 of 4 cycles PEMEtrexed (ALIMTA) 925 mg in sodium chloride 0.9 % 100 mL chemo infusion, 500 mg/m2 = 925 mg, Intravenous,  Once, 1 of 6 cycles CARBOplatin (PARAPLATIN) 470 mg in sodium chloride 0.9 % 250 mL chemo infusion, 470 mg (100 % of original dose 467.5 mg), Intravenous,  Once, 1 of 4 cycles Dose modification: 467.5 mg (original dose 467.5 mg, Cycle 1) pembrolizumab (KEYTRUDA) 200 mg in sodium chloride 0.9 % 50 mL chemo infusion, 200 mg, Intravenous, Once, 1 of 6 cycles fosaprepitant (EMEND) 150 mg, dexamethasone (DECADRON) 12 mg in sodium chloride 0.9 % 145 mL IVPB, , Intravenous,  Once, 1 of 4 cycles  for chemotherapy treatment.      History of Present Illness: The patient's records from the referring physician were obtained and reviewed and the patient interviewed to confirm this HPI.  Kyle Garcia presents today after he was found to have abnormal screening brain MRI during lung cancer workup.  He denies any neurologic symptoms, no headaches, seizures, slurred speech.  Only recent procedure was bronchoscopy performed on 05/21/19.  Has not yet  started on systemic therapy, pending tumor biomarkers.  Medications: Current Outpatient Medications on File Prior to Visit  Medication Sig Dispense Refill   acetaminophen (TYLENOL) 500 MG tablet Take 1,000 mg by mouth every 6 (six) hours as needed for headache.     folic acid (FOLVITE) 1 MG tablet Take 1 tablet (1 mg total) by mouth daily. 30 tablet 4   methylPREDNISolone (MEDROL DOSEPAK) 4 MG TBPK tablet Take per package instructions 21 tablet 0   Multiple Vitamins-Minerals (MULTIVITAMIN WITH MINERALS) tablet Take 1 tablet by mouth daily.      Multiple Vitamins-Minerals (PRESERVISION AREDS 2) CAPS Take 1 capsule by mouth 2 (two) times daily.     omeprazole (PRILOSEC) 40 MG capsule Take 1 capsule (40 mg total) by mouth daily. 90 capsule 3   Polyvinyl Alcohol-Povidone (REFRESH OP) Place 1 drop into both eyes every evening.     pravastatin (PRAVACHOL) 20 MG tablet TAKE 1 TABLET DAILY AT 6 PM (Patient taking differently: Take 20 mg by mouth daily at 6 PM. ) 90 tablet 1   aspirin-acetaminophen-caffeine (EXCEDRIN EXTRA STRENGTH) 250-250-65 MG tablet Take 2 tablets by mouth every 6 (six) hours as needed for headache.     prednisoLONE acetate (PRED FORTE) 1 % ophthalmic suspension      prochlorperazine (COMPAZINE) 10 MG tablet Take 1 tablet (10 mg total) by mouth every 6 (six) hours as needed for nausea or vomiting. (Patient not taking: Reported on 06/17/2019) 30 tablet 0   Rivaroxaban 15 & 20 MG TBPK Take as directed on package: Start with one 59m tablet by  mouth twice a day with food. On Day 22, switch to one 37m tablet once a day with food. 51 each 0   Current Facility-Administered Medications on File Prior to Visit  Medication Dose Route Frequency Provider Last Rate Last Dose   0.9 %  sodium chloride infusion   Intravenous Once PRN MCurt Bears MD       albuterol (PROVENTIL) (2.5 MG/3ML) 0.083% nebulizer solution 2.5 mg  2.5 mg Nebulization Once PRN MCurt Bears MD        CARBOplatin (PARAPLATIN) 470 mg in sodium chloride 0.9 % 250 mL chemo infusion  470 mg Intravenous Once MCurt Bears MD 594 mL/hr at 06/17/19 1538 470 mg at 06/17/19 1538   diphenhydrAMINE (BENADRYL) injection 25 mg  25 mg Intravenous Once PRN MCurt Bears MD       diphenhydrAMINE (BENADRYL) injection 50 mg  50 mg Intravenous Once PRN MCurt Bears MD       EPINEPHrine (ADRENALIN) 0.5 mg  0.5 mg Subcutaneous Once PRN MCurt Bears MD       EPINEPHrine (ADRENALIN) 0.5 mg  0.5 mg Subcutaneous Once PRN MCurt Bears MD       EPINEPHrine (ADRENALIN) 1 MG/10ML injection 0.25 mg  0.25 mg Intravenous Once PRN MCurt Bears MD       EPINEPHrine (ADRENALIN) 1 MG/10ML injection 0.25 mg  0.25 mg Intravenous Once PRN MCurt Bears MD       famotidine (PEPCID) IVPB 20 mg premix  20 mg Intravenous Once PRN MCurt Bears MD       methylPREDNISolone sodium succinate (SOLU-MEDROL) 125 mg/2 mL injection 125 mg  125 mg Intravenous Once PRN MCurt Bears MD        Allergies: No Known Allergies Past Medical History:  Past Medical History:  Diagnosis Date   GERD (gastroesophageal reflux disease)    Hyperlipidemia    Past Surgical History:  Past Surgical History:  Procedure Laterality Date   CATARACT EXTRACTION  2016   EYE SURGERY     VIDEO BRONCHOSCOPY Bilateral 05/21/2019   Procedure: VIDEO BRONCHOSCOPY WITH FLUORO;  Surgeon: BCollene Gobble MD;  Location: [facility]  Service: Cardiopulmonary;  Laterality: Bilateral;   Social History:  Social History   Socioeconomic History   Marital status: Married    Spouse name: Not on file   Number of children: Not on file   Years of education: Not on file   Highest education level: Not on file  Occupational History   Not on file  Social Needs   Financial resource strain: Not on file   Food insecurity    Worry: Not on file    Inability: Not on file   Transportation needs    Medical: Not on file      Non-medical: Not on file  Tobacco Use   Smoking status: Former Smoker    Types: Cigars    Quit date: 02/07/2019    Years since quitting: 0.3   Smokeless tobacco: Never Used   Tobacco comment: occassional cigar/stopped ciggs in 1981; as of 04/28/2019, rarely smokes  Substance and Sexual Activity   Alcohol use: Yes    Alcohol/week: 4.0 - 7.0 standard drinks    Types: 1 - 3 Glasses of wine, 3 - 4 Cans of beer per week   Drug use: Yes   Sexual activity: Not on file  Lifestyle   Physical activity    Days per week: Not on file    Minutes per session: Not on file   Stress: Not on  file  Relationships   Social connections    Talks on phone: Not on file    Gets together: Not on file    Attends religious service: Not on file    Active member of club or organization: Not on file    Attends meetings of clubs or organizations: Not on file    Relationship status: Not on file   Intimate partner violence    Fear of current or ex partner: Not on file    Emotionally abused: Not on file    Physically abused: Not on file    Forced sexual activity: Not on file  Other Topics Concern   Not on file  Social History Narrative   Not on file   Family History: No family history on file.  Review of Systems: Constitutional: Denies fevers, chills or abnormal weight loss Eyes: Denies blurriness of vision Ears, nose, mouth, throat, and face: Denies mucositis or sore throat Respiratory: Denies cough, dyspnea or wheezes Cardiovascular: Denies palpitation, chest discomfort or lower extremity swelling Gastrointestinal:  Denies nausea, constipation, diarrhea GU: Denies dysuria or incontinence Skin: Denies abnormal skin rashes Neurological: Per HPI Musculoskeletal: Denies joint pain, back or neck discomfort. No decrease in ROM Behavioral/Psych: Denies anxiety, disturbance in thought content, and mood instability   Physical Exam: Vitals:   06/17/19 0955  BP: 140/83  Pulse: (!) 107   Resp: 19  Temp: 98.6 F (37 C)  SpO2: 100%   KPS: 100. General: Alert, cooperative, pleasant, in no acute distress Head: Normal EENT: No conjunctival injection or scleral icterus. Oral mucosa moist Lungs: Resp effort normal Cardiac: Regular rate and rhythm Abdomen: Soft, non-distended abdomen Skin: No rashes cyanosis or petechiae. Extremities: No clubbing or edema  Neurologic Exam: Mental Status: Awake, alert, attentive to examiner. Oriented to self and environment. Language is fluent with intact comprehension.  Cranial Nerves: Visual acuity is grossly normal. Visual fields are full. Extra-ocular movements intact. No ptosis. Face is symmetric, tongue midline. Motor: Tone and bulk are normal. Power is full in both arms and legs. Reflexes are symmetric, no pathologic reflexes present. Intact finger to nose bilaterally Sensory: Intact to light touch and temperature Gait: Normal and tandem gait is normal.  Labs: I have reviewed the data as listed    Component Value Date/Time   NA 140 06/17/2019 1137   K 4.0 06/17/2019 1137   CL 103 06/17/2019 1137   CO2 26 06/17/2019 1137   GLUCOSE 99 06/17/2019 1137   BUN 15 06/17/2019 1137   CREATININE 0.90 06/17/2019 1137   CREATININE 0.85 12/02/2018 0906   CALCIUM 8.6 (L) 06/17/2019 1137   PROT 7.0 06/17/2019 1137   ALBUMIN 3.5 06/17/2019 1137   AST 16 06/17/2019 1137   ALT 33 06/17/2019 1137   ALKPHOS 139 (H) 06/17/2019 1137   BILITOT 0.4 06/17/2019 1137   GFRNONAA >60 06/17/2019 1137   GFRNONAA 91 12/02/2018 0906   GFRAA >60 06/17/2019 1137   GFRAA 105 12/02/2018 0906   Lab Results  Component Value Date   WBC 12.7 (H) 06/17/2019   NEUTROABS 11.1 (H) 06/17/2019   HGB 13.2 06/17/2019   HCT 42.4 06/17/2019   MCV 85.5 06/17/2019   PLT 183 06/17/2019    Imaging:  Mr Jeri Cos PI Contrast  Result Date: 06/11/2019 CLINICAL DATA:  Staging of non-small cell lung cancer. EXAM: MRI HEAD WITHOUT AND WITH CONTRAST TECHNIQUE:  Multiplanar, multiecho pulse sequences of the brain and surrounding structures were obtained without and with intravenous contrast. CONTRAST:  7 cc Gadavist COMPARISON:  None. FINDINGS: Brain: This examination is indeterminate for small strokes and metastatic lesions. Diffusion imaging shows 3 or 4 punctate foci minimal restricted diffusion in the left cerebellum. Within the cerebral hemispheres, there are 25-30 punctate foci of restricted diffusion scattered within the cortical and subcortical brain, most numerous in the right parietal region. These are associated with T2 and FLAIR signal. Some are of slightly differing ages. From the standpoint of contrast enhancement. A few of these show contrast enhancement, no more than 7 or 8 of the punctate lesions in the cerebral hemispheres. 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 There is no large lesion. No lesion is associated with vasogenic edema. No hydrocephalus, hemorrhage or extra-axial collection. Incidental note is made of a small lipoma at the midline hypothalamus. Vascular: Major vessels at the base of the brain show flow. Skull and upper cervical spine: Negative. Benign appearing heterogeneous appearance the clivus. Sinuses/Orbits: Clear/normal Other: None IMPRESSION: 25-30 punctate foci of restricted diffusion scattered within the left cerebellum and both cerebral hemispheres, with slightly differing levels of restricted diffusion. 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 7 or 8 of the small foci show contrast enhancement, but the majority do not. I cannot, at this point, completely exclude the possibility metastatic disease at some of these foci. Therefore, imaging follow-up will be necessary. This could be done in 1 month at which time we should be able to differentiate micro embolic  infarctions from metastases. In the meantime, it would make sense to evaluate the heart and ascending aorta for potential embolic sources. Electronically Signed   By: Nelson Chimes M.D.   On: 06/11/2019 14:28   Dg Chest Port 1 View  Result Date: 05/21/2019 CLINICAL DATA:  Bronchoscopy.  Pneumonia EXAM: PORTABLE CHEST 1 VIEW COMPARISON:  PET-CT 05/06/2019, chest radiograph 04/27/2019 FINDINGS: Normal cardiac silhouette. Airspace disease in the RIGHT lower lobe similar pattern to 04/27/2019 with mild increase in density. No LEFT lung airspace disease. No pneumothorax. IMPRESSION: 1. Increased density of RIGHT lower lobe airspace disease. 2. No pneumothorax.  No pleural fluid. Electronically Signed   By: Suzy Bouchard M.D.   On: 05/21/2019 14:45   Dg C-arm Bronchoscopy  Result Date: 05/21/2019 C-ARM BRONCHOSCOPY: Fluoroscopy was utilized by the requesting physician.  No radiographic interpretation.     Assessment/Plan 1. Abnormal brain MRI 2. Stroke of unknown etiology [facility])  Kyle Garcia presents today absent a clinical syndrome, with multiple DWI positive and enhancing lesions on lung cancer screening brain MRI.    His case was reviewed extensively in brain and spine tumor board with neuroradiology.  Pattern of restricted diffusion is most consistent with borderzone infarct rather than widespread metastatic deposits.  Because affected territory is bilateral and symmetric, this would suggest a provoking hypotensive episode.    Because the patient denies any history of symptomatic or documented hypotension, it's possible that cerebral perfusion pressure could have dropped during the bronchoscopy.  Because this procedure was brief and involved little anesthesia, this finding gives concern for presence of underlying large vessel stenosis, possibly even proximal carotid or aortic.  That said, his cancer histology makes him very high risk for brain metastases, and it's also possible that concurrent  met(s) are present and difficult to distinguish from overlying vascular pattern.  We recommended repeating MRI brain with and without contrast,  along with MRA of head and neck in 1 month.  At this point, DWI signal should have resolved and neoplastic disease process will be easier to identify, if there is any.  He should also obtain a transthoracic cardiac echocardiogram to rule out cardioembolism as stroke etiology.  Dr. Julien Nordmann plans to resume his xarelto due to prior DVT.  We will therefore not recommend any antiplatelet therapy.  He should continue pravastatin as prior.  We spent twenty additional minutes teaching regarding the natural history, biology, and historical experience in the treatment of neurologic complications of cancer. We also provided teaching sheets for the patient to take home as an additional resource.  We appreciate the opportunity to participate in the care of [employer].  He should return to clinic in 1 month following the above testing, or sooner if clinically indicated.  All questions were answered. The patient knows to call the clinic with any problems, questions or concerns. No barriers to learning were detected.  The total time spent in the encounter was 40 minutes and more than 50% was on counseling and review of test results   Ventura Sellers, MD Medical Director of Neuro-Oncology [facility] [place] 06/17/19 3:50 PM

## 2019-06-17 NOTE — Patient Instructions (Signed)
[facility] Discharge Instructions for Patients Receiving Chemotherapy  Today you received the following chemotherapy agents Keytruda, Alimta and Carboplatin. To help prevent nausea and vomiting after your treatment, we encourage you to take your nausea medication as directed BUT NO ZOFRAN FOR 3 DAYS.   If you develop nausea and vomiting that is not controlled by your nausea medication, call the clinic.   BELOW ARE SYMPTOMS THAT SHOULD BE REPORTED IMMEDIATELY:  *FEVER GREATER THAN 100.5 F  *CHILLS WITH OR WITHOUT FEVER  NAUSEA AND VOMITING THAT IS NOT CONTROLLED WITH YOUR NAUSEA MEDICATION  *UNUSUAL SHORTNESS OF BREATH  *UNUSUAL BRUISING OR BLEEDING  TENDERNESS IN MOUTH AND THROAT WITH OR WITHOUT PRESENCE OF ULCERS  *URINARY PROBLEMS  *BOWEL PROBLEMS  UNUSUAL RASH Items with * indicate a potential emergency and should be followed up as soon as possible.  Feel free to call the clinic you have any questions or concerns. The clinic phone number is [phone].  Please show the [place] at check-in to the Emergency Department and triage nurse.

## 2019-06-17 NOTE — Progress Notes (Signed)
[facility] OFFICE PROGRESS NOTE  Susy Frizzle, MD [address] [place] [address]  DIAGNOSIS: Likely IIIc/IV (T3, N3, M1a) non-small cell lung cancer, adenocarcinoma presented with right lower lobe consolidative and infiltrative process with suspicious lymphangitic spread as well as bilateral hilar and mediastinal lymphadenopathy and left supraclavicular lymphadenopathy diagnosed in May 2020.   PDL1 Expression: 50%  Molecular Studies:   PRIOR THERAPY: None  CURRENT THERAPY: Systemic chemotherapy with Carboplatin for AUC of 5, Alimta 500 mg/M2 and Keytruda 200 mg IV every 3. First dose 06/17/2019.   INTERVAL HISTORY: Kyle Garcia 68 y.o. male returns to the clinic for a follow-up visit.  The patient is feeling well today without any concerning complaints except for a persistent cough. The patient saw his primary care provider recently who prescribed antibiotics and steroids.  He also has tried taking Delsym, cough drops, nyquil, and Hycodan for his cough. The patient states this is been present since he had his bronchoscopy.  He denies any associated nasal congestion, sore throat, shortness of breath, or hemoptysis. He experiences associated chest tenderness after a coughing fit.   The patient was diagnosed with a DVT in April 2020. He was on Xarelto for 2 months before it was discontinued. He denies any more right leg swelling or pain in his calf. He denies any bleeding or bruising including melena, hematochezia, hematuria, or gingival bleeding with Xarelto.   The patient denies any nausea, vomiting, diarrhea, or constipation.  The patient denies any weight loss or chills; however, he does state that he intermittently has fevers since being diagnosed. The patient denies any recent febrile episodes.  He also states he has night sweats, which have been present for several months prior to his lung cancer diagnosis. The patient recently had molecular studies  performed by [facility].  He is here today for evaluation and to review the results of his molecular studies before proceeding with cycle #1 systemic chemotherapy.   MEDICAL HISTORY: Past Medical History:  Diagnosis Date  . GERD (gastroesophageal reflux disease)   . Hyperlipidemia     ALLERGIES:  has No Known Allergies.  MEDICATIONS:  Current Outpatient Medications  Medication Sig Dispense Refill  . acetaminophen (TYLENOL) 500 MG tablet Take 1,000 mg by mouth every 6 (six) hours as needed for headache.    Marland Kitchen aspirin-acetaminophen-caffeine (EXCEDRIN EXTRA STRENGTH) 250-250-65 MG tablet Take 2 tablets by mouth every 6 (six) hours as needed for headache.    . folic acid (FOLVITE) 1 MG tablet Take 1 tablet (1 mg total) by mouth daily. 30 tablet 4  . methylPREDNISolone (MEDROL DOSEPAK) 4 MG TBPK tablet Take per package instructions 21 tablet 0  . Multiple Vitamins-Minerals (MULTIVITAMIN WITH MINERALS) tablet Take 1 tablet by mouth daily.     . Multiple Vitamins-Minerals (PRESERVISION AREDS 2) CAPS Take 1 capsule by mouth 2 (two) times daily.    Marland Kitchen omeprazole (PRILOSEC) 40 MG capsule Take 1 capsule (40 mg total) by mouth daily. 90 capsule 3  . Polyvinyl Alcohol-Povidone (REFRESH OP) Place 1 drop into both eyes every evening.    . pravastatin (PRAVACHOL) 20 MG tablet TAKE 1 TABLET DAILY AT 6 PM (Patient taking differently: Take 20 mg by mouth daily at 6 PM. ) 90 tablet 1  . prednisoLONE acetate (PRED FORTE) 1 % ophthalmic suspension     . prochlorperazine (COMPAZINE) 10 MG tablet Take 1 tablet (10 mg total) by mouth every 6 (six) hours as needed for nausea or  vomiting. (Patient not taking: Reported on 06/17/2019) 30 tablet 0  . Rivaroxaban 15 & 20 MG TBPK Take as directed on package: Start with one 19m tablet by mouth twice a day with food. On Day 22, switch to one 227mtablet once a day with food. 51 each 0   No current facility-administered medications for this visit.    Facility-Administered  Medications Ordered in Other Visits  Medication Dose Route Frequency Provider Last Rate Last Dose  . 0.9 %  sodium chloride infusion   Intravenous Once PRN MoCurt BearsMD      . albuterol (PROVENTIL) (2.5 MG/3ML) 0.083% nebulizer solution 2.5 mg  2.5 mg Nebulization Once PRN MoCurt BearsMD      . diphenhydrAMINE (BENADRYL) injection 25 mg  25 mg Intravenous Once PRN MoCurt BearsMD      . diphenhydrAMINE (BENADRYL) injection 50 mg  50 mg Intravenous Once PRN MoCurt BearsMD      . EPINEPHrine (ADRENALIN) 0.5 mg  0.5 mg Subcutaneous Once PRN MoCurt BearsMD      . EPINEPHrine (ADRENALIN) 0.5 mg  0.5 mg Subcutaneous Once PRN MoCurt BearsMD      . EPINEPHrine (ADRENALIN) 1 MG/10ML injection 0.25 mg  0.25 mg Intravenous Once PRN MoCurt BearsMD      . EPINEPHrine (ADRENALIN) 1 MG/10ML injection 0.25 mg  0.25 mg Intravenous Once PRN MoCurt BearsMD      . famotidine (PEPCID) IVPB 20 mg premix  20 mg Intravenous Once PRN MoCurt BearsMD      . methylPREDNISolone sodium succinate (SOLU-MEDROL) 125 mg/2 mL injection 125 mg  125 mg Intravenous Once PRN MoCurt BearsMD        SURGICAL HISTORY:  Past Surgical History:  Procedure Laterality Date  . CATARACT EXTRACTION  2016  . EYE SURGERY    . VIDEO BRONCHOSCOPY Bilateral 05/21/2019   Procedure: VIDEO BRONCHOSCOPY WITH FLUORO;  Surgeon: ByCollene GobbleMD;  [facility];  Service: Cardiopulmonary;  Laterality: Bilateral;    REVIEW OF SYSTEMS:   Review of Systems  Constitutional: Positive for fever and fatigue. Negative for appetite change, chills,  and unexpected weight change.  HENT: Negative for mouth sores, nosebleeds, sore throat and trouble swallowing.   Eyes: Negative for eye problems and icterus.  Respiratory: Positive for cough and occasional hoarseness. Negative for hemoptysis, shortness of breath and wheezing.   Cardiovascular: Negative for chest pain and leg swelling.   Gastrointestinal: Negative for abdominal pain, constipation, diarrhea, nausea and vomiting.  Genitourinary: Negative for bladder incontinence, difficulty urinating, dysuria, frequency and hematuria.   Musculoskeletal: Negative for back pain, gait problem, neck pain and neck stiffness.  Skin: Negative for itching and rash.  Neurological: Negative for dizziness, extremity weakness, gait problem, headaches, light-headedness and seizures.  Hematological: Negative for adenopathy. Does not bruise/bleed easily.  Psychiatric/Behavioral: Negative for confusion, depression and sleep disturbance. The patient is not nervous/anxious.     PHYSICAL EXAMINATION:  There were no vitals taken for this visit.  ECOG PERFORMANCE STATUS: 1 - Symptomatic but completely ambulatory  Physical Exam  Constitutional: Oriented to person, place, and time and well-developed, well-nourished, and in no distress. . Marland KitchenHENT:  Head: Normocephalic and atraumatic.  Mouth/Throat: Oropharynx is clear and moist. No oropharyngeal exudate.  Eyes: Conjunctivae are normal. Right eye exhibits no discharge. Left eye exhibits no discharge. No scleral icterus.  Neck: Normal range of motion. Neck supple.  Cardiovascular: Normal rate, regular rhythm, normal heart sounds and intact  distal pulses.   Pulmonary/Chest: Effort normal and breath sounds normal. No respiratory distress. No wheezes. No rales.  Abdominal: Soft. Bowel sounds are normal. Exhibits no distension and no mass. There is no tenderness.  Musculoskeletal: Normal range of motion. Exhibits no edema.  Lymphadenopathy:    No cervical adenopathy.  Neurological: Alert and oriented to person, place, and time. Exhibits normal muscle tone. Gait normal. Coordination normal.  Skin: Skin is warm and dry. No rash noted. Not diaphoretic. No erythema. No pallor.  Psychiatric: Mood, memory and judgment normal.  Vitals reviewed.  LABORATORY DATA: Lab Results  Component Value Date   WBC  12.7 (H) 06/17/2019   HGB 13.2 06/17/2019   HCT 42.4 06/17/2019   MCV 85.5 06/17/2019   PLT 183 06/17/2019      Chemistry      Component Value Date/Time   NA 140 06/17/2019 1137   K 4.0 06/17/2019 1137   CL 103 06/17/2019 1137   CO2 26 06/17/2019 1137   BUN 15 06/17/2019 1137   CREATININE 0.90 06/17/2019 1137   CREATININE 0.85 12/02/2018 0906      Component Value Date/Time   CALCIUM 8.6 (L) 06/17/2019 1137   ALKPHOS 139 (H) 06/17/2019 1137   AST 16 06/17/2019 1137   ALT 33 06/17/2019 1137   BILITOT 0.4 06/17/2019 1137       RADIOGRAPHIC STUDIES:  Mr Jeri Cos WO Contrast  Result Date: 06/11/2019 CLINICAL DATA:  Staging of non-small cell lung cancer. EXAM: MRI HEAD WITHOUT AND WITH CONTRAST TECHNIQUE: Multiplanar, multiecho pulse sequences of the brain and surrounding structures were obtained without and with intravenous contrast. CONTRAST:  7 cc Gadavist COMPARISON:  None. FINDINGS: Brain: This examination is indeterminate for small strokes and metastatic lesions. Diffusion imaging shows 3 or 4 punctate foci minimal restricted diffusion in the left cerebellum. Within the cerebral hemispheres, there are 25-30 punctate foci of restricted diffusion scattered within the cortical and subcortical brain, most numerous in the right parietal region. These are associated with T2 and FLAIR signal. Some are of slightly differing ages. From the standpoint of contrast enhancement. A few of these show contrast enhancement, no more than 7 or 8 of the punctate lesions in the cerebral hemispheres. 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 There is no large lesion. No lesion is associated with vasogenic edema. No hydrocephalus, hemorrhage or extra-axial collection. Incidental note is made of a small lipoma at the midline hypothalamus. Vascular: Major vessels at the base of the brain show  flow. Skull and upper cervical spine: Negative. Benign appearing heterogeneous appearance the clivus. Sinuses/Orbits: Clear/normal Other: None IMPRESSION: 25-30 punctate foci of restricted diffusion scattered within the left cerebellum and both cerebral hemispheres, with slightly differing levels of restricted diffusion. 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 7 or 8 of the small foci show contrast enhancement, but the majority do not. I cannot, at this point, completely exclude the possibility metastatic disease at some of these foci. Therefore, imaging follow-up will be necessary. This could be done in 1 month at which time we should be able to differentiate micro embolic infarctions from metastases. In the meantime, it would make sense to evaluate the heart and ascending aorta for potential embolic sources. Electronically Signed   By: Nelson Chimes M.D.   On: 06/11/2019 14:28   Dg Chest Port 1  View  Result Date: 05/21/2019 CLINICAL DATA:  Bronchoscopy.  Pneumonia EXAM: PORTABLE CHEST 1 VIEW COMPARISON:  PET-CT 05/06/2019, chest radiograph 04/27/2019 FINDINGS: Normal cardiac silhouette. Airspace disease in the RIGHT lower lobe similar pattern to 04/27/2019 with mild increase in density. No LEFT lung airspace disease. No pneumothorax. IMPRESSION: 1. Increased density of RIGHT lower lobe airspace disease. 2. No pneumothorax.  No pleural fluid. Electronically Signed   By: Suzy Bouchard M.D.   On: 05/21/2019 14:45   Dg C-arm Bronchoscopy  Result Date: 05/21/2019 C-ARM BRONCHOSCOPY: Fluoroscopy was utilized by the requesting physician.  No radiographic interpretation.     ASSESSMENT/PLAN:  This is a very pleasant 68 year old Caucasian male with stage IIIc/IV (T3, N3, M1a) non-small cell lung cancer, adenocarcinoma.  He presented with a right lower lobe consolidated infiltrative process suspicious for lymphangitic spread as well as bilateral  hilar and mediastinal lymphadenopathy and left supraclavicular lymphadenopathy. He was diagnosed in May 2020. His PDL1 expression is 50%.   The patient recently had molecular studies performed by [facility] as well as [facility].  Unfortunately, the patient has no actionable mutations.  The patient was seen with Dr. Julien Nordmann today.  Labs were reviewed.  We recommend that the patient proceed with cycle #1 of carboplatin for an AUC of 5, Alimta 500 mg/m, and Keytruda 200 mg IV every 3 weeks today as scheduled.  We will see the patient back for follow-up visit in 1 week for evaluation and to manage any adverse side effects of treatment.  The patient had an appointment with Dr. Mickeal Skinner today to review his recent abnormal screening MRI of the brain during his lung cancer work up. Dr. Mickeal Skinner recommends repeating the MRI brain with and without contrast as well as obtaining a MRA of the head and neck in about 4 weeks. He additionally is going to order a repeat EKG as well as an echocardiogram to rule out a cardioembolism.   The patient recently was diagnosed with a DVT in April 2020. He was subsequently placed on Xarelto for 2 months. After discussion with Dr. Julien Nordmann, we recommend that given the patient's recent lung cancer diagnosis/ hypercoagulable state, that the patient continue on Xarelto. I have sent a refill to the patient's pharmacy. The patient knows to seek medical attention should he develop signs and symptoms of bleeding.  The patient will continue taking his 1 mg tablet of folic acid daily as prescribed. He also will take Compazine 10 mg p.o. every 6 hours as needed for nausea.  I reviewed neutropenic precautions with the patient and alerted him to seek medical attention should he develop a fever or signs and symptoms of infection.   The patient was advised to call immediately if he has any concerning symptoms in the interval. The patient voices understanding of current disease status and  treatment options and is in agreement with the current care plan. All questions were answered. The patient knows to call the clinic with any problems, questions or concerns. We can certainly see the patient much sooner if necessary   Orders Placed This Encounter  Procedures  . CMP ([facility] only)    Standing Status:   Standing    Number of Occurrences:   20    Standing Expiration Date:   06/16/2020  . CBC with Differential (Cancer Center Only)    Standing Status:   Standing    Number of Occurrences:   20    Standing Expiration Date:   06/16/2020  . TSH  Standing Status:   Standing    Number of Occurrences:   15    Standing Expiration Date:   06/16/2020     Tobe Sos Heilingoetter, PA-C 06/17/19  ADDENDUM: Hematology/Oncology Attending: I had a face-to-face encounter with the patient today.  I recommended his care plan.  This is a very pleasant 68 years old white male recently diagnosed with a stage IV non-small cell lung cancer, adenocarcinoma with no actionable mutations and PDL 1 expression of 50%. The patient is feeling fine today except for the persistent cough.  He was recently treated with a Medrol Dosepak and feeling a little bit better. He is here today to start the first cycle of systemic chemotherapy with carboplatin for AUC of 5, Alimta 500 mg/M2 and Keytruda 200 mg IV every 3 weeks.  The patient received vitamin B12 injection and he is currently on folic acid on daily basis. I discussed the molecular studies with the patient.  I recommended for him to proceed with the first cycle of his treatment as planned.  His most recent MRI of the brain showed 25-30 punctate foci of restricted diffusion scattered within the left cerebellum and both cerebral hemispheres suspicious for microembolic infarctions.  The patient was seen by Dr. Mickeal Skinner and he is expected to have repeat imaging studies and evaluation in a few weeks. He has a history of deep venous thrombosis and the patient  was treated with 2 months of Xarelto in the past and this was discontinued.  I will resume his treatment with Xarelto starting with 15 mg p.o. twice daily followed by 20 mg po daily. We will see the patient back for follow-up visit in 1 week for evaluation and any adverse effect of his treatment. He was advised to call immediately if he has any concerning symptoms in the interval.  Disclaimer: This note was dictated with voice recognition software. Similar sounding words can inadvertently be transcribed and may be missed upon review. Eilleen Kempf, MD 06/17/19

## 2019-06-18 ENCOUNTER — Telehealth: Payer: Self-pay | Admitting: Internal Medicine

## 2019-06-18 ENCOUNTER — Other Ambulatory Visit: Payer: Self-pay | Admitting: Internal Medicine

## 2019-06-18 ENCOUNTER — Telehealth: Payer: Self-pay | Admitting: Medical Oncology

## 2019-06-18 ENCOUNTER — Ambulatory Visit: Payer: Medicare Other | Admitting: Family Medicine

## 2019-06-18 DIAGNOSIS — I639 Cerebral infarction, unspecified: Secondary | ICD-10-CM

## 2019-06-18 DIAGNOSIS — I6340 Cerebral infarction due to embolism of unspecified cerebral artery: Secondary | ICD-10-CM

## 2019-06-18 NOTE — Telephone Encounter (Signed)
Scheduled appt per 6/24 los -  Pt is aware of apt date and time

## 2019-06-18 NOTE — Telephone Encounter (Signed)
Pt called because he said he missed a call. He has  no complaints after chemo yesterday . He understands when to call.

## 2019-06-18 NOTE — Telephone Encounter (Signed)
Scheduled appt per 6/24 los.

## 2019-06-19 ENCOUNTER — Other Ambulatory Visit: Payer: Self-pay

## 2019-06-19 ENCOUNTER — Encounter: Payer: Self-pay | Admitting: Family Medicine

## 2019-06-19 ENCOUNTER — Ambulatory Visit (INDEPENDENT_AMBULATORY_CARE_PROVIDER_SITE_OTHER): Payer: Medicare Other | Admitting: Family Medicine

## 2019-06-19 DIAGNOSIS — C3491 Malignant neoplasm of unspecified part of right bronchus or lung: Secondary | ICD-10-CM

## 2019-06-19 DIAGNOSIS — I82401 Acute embolism and thrombosis of unspecified deep veins of right lower extremity: Secondary | ICD-10-CM

## 2019-06-19 DIAGNOSIS — I639 Cerebral infarction, unspecified: Secondary | ICD-10-CM | POA: Insufficient documentation

## 2019-06-19 DIAGNOSIS — R05 Cough: Secondary | ICD-10-CM | POA: Diagnosis not present

## 2019-06-19 DIAGNOSIS — I6930 Unspecified sequelae of cerebral infarction: Secondary | ICD-10-CM | POA: Diagnosis not present

## 2019-06-19 DIAGNOSIS — I6310 Cerebral infarction due to embolism of unspecified precerebral artery: Secondary | ICD-10-CM

## 2019-06-19 DIAGNOSIS — R053 Chronic cough: Secondary | ICD-10-CM

## 2019-06-19 NOTE — Progress Notes (Signed)
Subjective:    Patient ID: Kyle Garcia, male    DOB: 1951-11-03, 69 y.o.   MRN: [id number]  HPI  Patient is a 68 year old Caucasian male being seen today as a telephone visit.  Patient consents to be seen by telephone.  Phone call began at 900.  Phone call concluded at 918.  Since I last saw the patient, he has had a series of unfortunate events.  We had referred the patient to pulmonology due to a chronic cough.  X-ray showed possible pneumonia and he was started empirically on Levaquin.  However the pneumonia did not improve.  Patient was also found to have a DVT in his right leg and was started on Xarelto.  When the pneumonia did not improve the patient underwent bronchoscopy after having a positive PET scan.  Bronchoscopy confirmed adenocarcinoma of the right lung.  There is also metastatic spread into his lymph nodes.  He has been diagnosed with stage IV adenocarcinoma of the right lung.  Patient just began chemotherapy this week.  As part of his diagnostic work-up, the patient had an MRI of the brain to evaluate for metastatic spread to the brain.  MRI report showed 25-30 small punctate areas of possible like stroke although metastasis cannot be totally excluded.  Patient saw the neurologist recently who felt confident that these were strokes and not metastatic spread.  Obviously the pattern on the MRI is concerning for an embolic stroke given the number.  The patient states the neurologist felt certain that this occurred while he had his bronchoscopy.  According to the patient he has a repeat MRI scheduled in 1 month as well as an MRA of the neck scheduled to evaluate for any disease in the carotid arteries.  He is already back on Xarelto for embolic stroke as well as for lifelong prevention of DVT given the fact his DVT occurred in the setting of cancer that is incurable.  Patient is uncertain if they have scheduled him for an echocardiogram to evaluate for any cardiac source of emboli.  Patient has  not had any type of event monitor to evaluate him for atrial fibrillation.  I feel that this is very unlikely given the pattern of the stroke and the number foci.  Furthermore if he was found to have paroxysmal atrial fibrillation we would just continue him on the Xarelto that he is taking indefinitely anyway.  Therefore I do not see the utility of scheduling him for an implantable cardiac monitor.  Past Medical History:  Diagnosis Date   Cancer [facility])    stage 4 adenocarcinoma right lung   GERD (gastroesophageal reflux disease)    Hyperlipidemia    Stroke ([place])    25-30 emboli seen on screening MRI    Past Surgical History:  Procedure Laterality Date   CATARACT EXTRACTION  2016   EYE SURGERY     VIDEO BRONCHOSCOPY Bilateral 05/21/2019   Procedure: VIDEO BRONCHOSCOPY WITH FLUORO;  Surgeon: Collene Gobble, MD;  Location: [facility];  Service: Cardiopulmonary;  Laterality: Bilateral;   Current Outpatient Medications on File Prior to Visit  Medication Sig Dispense Refill   acetaminophen (TYLENOL) 500 MG tablet Take 1,000 mg by mouth every 6 (six) hours as needed for headache.     aspirin-acetaminophen-caffeine (EXCEDRIN EXTRA STRENGTH) 250-250-65 MG tablet Take 2 tablets by mouth every 6 (six) hours as needed for headache.     folic acid (FOLVITE) 1 MG tablet Take 1 tablet (1 mg total) by mouth daily. [place]  tablet 4   methylPREDNISolone (MEDROL DOSEPAK) 4 MG TBPK tablet Take per package instructions 21 tablet 0   Multiple Vitamins-Minerals (MULTIVITAMIN WITH MINERALS) tablet Take 1 tablet by mouth daily.      Multiple Vitamins-Minerals (PRESERVISION AREDS 2) CAPS Take 1 capsule by mouth 2 (two) times daily.     omeprazole (PRILOSEC) 40 MG capsule Take 1 capsule (40 mg total) by mouth daily. 90 capsule 3   Polyvinyl Alcohol-Povidone (REFRESH OP) Place 1 drop into both eyes every evening.     pravastatin (PRAVACHOL) 20 MG tablet TAKE 1 TABLET DAILY AT 6 PM (Patient taking  differently: Take 20 mg by mouth daily at 6 PM. ) 90 tablet 1   prednisoLONE acetate (PRED FORTE) 1 % ophthalmic suspension      prochlorperazine (COMPAZINE) 10 MG tablet Take 1 tablet (10 mg total) by mouth every 6 (six) hours as needed for nausea or vomiting. (Patient not taking: Reported on 06/17/2019) 30 tablet 0   Rivaroxaban 15 & 20 MG TBPK Take as directed on package: Start with one 15mg  tablet by mouth twice a day with food. On Day 22, switch to one 20mg  tablet once a day with food. 51 each 0   No current facility-administered medications on file prior to visit.    No Known Allergies Social History   Socioeconomic History   Marital status: Married    Spouse name: Not on file   Number of children: Not on file   Years of education: Not on file   Highest education level: Not on file  Occupational History   Not on file  Social Needs   Financial resource strain: Not on file   Food insecurity    Worry: Not on file    Inability: Not on file   Transportation needs    Medical: Not on file    Non-medical: Not on file  Tobacco Use   Smoking status: Former Smoker    Types: Cigars    Quit date: 02/07/2019    Years since quitting: 0.3   Smokeless tobacco: Never Used   Tobacco comment: occassional cigar/stopped ciggs in 1981; as of 04/28/2019, rarely smokes  Substance and Sexual Activity   Alcohol use: Yes    Alcohol/week: 4.0 - 7.0 standard drinks    Types: 1 - 3 Glasses of wine, 3 - 4 Cans of beer per week   Drug use: Yes   Sexual activity: Not on file  Lifestyle   Physical activity    Days per week: Not on file    Minutes per session: Not on file   Stress: Not on file  Relationships   Social connections    Talks on phone: Not on file    Gets together: Not on file    Attends religious service: Not on file    Active member of club or organization: Not on file    Attends meetings of clubs or organizations: Not on file    Relationship status: Not on file     Intimate partner violence    Fear of current or ex partner: Not on file    Emotionally abused: Not on file    Physically abused: Not on file    Forced sexual activity: Not on file  Other Topics Concern   Not on file  Social History Narrative   Not on file      Review of Systems  All other systems reviewed and are negative.      Objective:   Physical  Exam  No physical exam could be completed as this was a phone visit      Assessment & Plan:  1. Chronic cough Due to stage IV adenocarcinoma of the right lung  2. Cerebrovascular accident (CVA) due to embolism of precerebral artery (HCC) I am not certain of the origin of the embolic stroke however the patient would benefit from imaging to evaluate for any carotid artery disease.  Per his report he has been scheduled for an MRI of the neck and head by his neurologist that he is seen.  I will complete the work-up by also scheduling the patient for an echocardiogram.  I recommended he stay on Xarelto for prevention of future embolic stroke  3. Acute deep vein thrombosis (DVT) of right lower extremity, unspecified vein (HCC)  Patient has a history of a DVT in the setting of cancer.  Therefore I recommended lifelong anticoagulation with Xarelto 4. Adenocarcinoma of right lung, stage 4 [facility]) Per oncology

## 2019-06-20 ENCOUNTER — Other Ambulatory Visit: Payer: Self-pay

## 2019-06-20 ENCOUNTER — Encounter (HOSPITAL_COMMUNITY): Payer: Self-pay | Admitting: Emergency Medicine

## 2019-06-20 ENCOUNTER — Emergency Department (HOSPITAL_BASED_OUTPATIENT_CLINIC_OR_DEPARTMENT_OTHER): Payer: Medicare Other

## 2019-06-20 ENCOUNTER — Observation Stay (HOSPITAL_COMMUNITY)
Admission: EM | Admit: 2019-06-20 | Discharge: 2019-06-21 | Disposition: A | Discharge status: Home / Self Care | Payer: Medicare Other | Attending: Internal Medicine | Admitting: Internal Medicine

## 2019-06-20 ENCOUNTER — Emergency Department (HOSPITAL_COMMUNITY): Payer: Medicare Other

## 2019-06-20 DIAGNOSIS — Z86718 Personal history of other venous thrombosis and embolism: Secondary | ICD-10-CM | POA: Insufficient documentation

## 2019-06-20 DIAGNOSIS — Z1159 Encounter for screening for other viral diseases: Secondary | ICD-10-CM | POA: Insufficient documentation

## 2019-06-20 DIAGNOSIS — J181 Lobar pneumonia, unspecified organism: Secondary | ICD-10-CM

## 2019-06-20 DIAGNOSIS — K219 Gastro-esophageal reflux disease without esophagitis: Secondary | ICD-10-CM | POA: Diagnosis not present

## 2019-06-20 DIAGNOSIS — Z7901 Long term (current) use of anticoagulants: Secondary | ICD-10-CM | POA: Diagnosis not present

## 2019-06-20 DIAGNOSIS — Z8673 Personal history of transient ischemic attack (TIA), and cerebral infarction without residual deficits: Secondary | ICD-10-CM | POA: Diagnosis not present

## 2019-06-20 DIAGNOSIS — Z79899 Other long term (current) drug therapy: Secondary | ICD-10-CM | POA: Insufficient documentation

## 2019-06-20 DIAGNOSIS — I82401 Acute embolism and thrombosis of unspecified deep veins of right lower extremity: Secondary | ICD-10-CM

## 2019-06-20 DIAGNOSIS — J189 Pneumonia, unspecified organism: Secondary | ICD-10-CM | POA: Diagnosis not present

## 2019-06-20 DIAGNOSIS — J188 Other pneumonia, unspecified organism: Secondary | ICD-10-CM | POA: Diagnosis not present

## 2019-06-20 DIAGNOSIS — I824Y1 Acute embolism and thrombosis of unspecified deep veins of right proximal lower extremity: Secondary | ICD-10-CM | POA: Diagnosis not present

## 2019-06-20 DIAGNOSIS — C3491 Malignant neoplasm of unspecified part of right bronchus or lung: Secondary | ICD-10-CM | POA: Diagnosis not present

## 2019-06-20 DIAGNOSIS — E785 Hyperlipidemia, unspecified: Secondary | ICD-10-CM | POA: Diagnosis not present

## 2019-06-20 DIAGNOSIS — I82409 Acute embolism and thrombosis of unspecified deep veins of unspecified lower extremity: Secondary | ICD-10-CM | POA: Diagnosis present

## 2019-06-20 DIAGNOSIS — R042 Hemoptysis: Secondary | ICD-10-CM | POA: Diagnosis not present

## 2019-06-20 DIAGNOSIS — L539 Erythematous condition, unspecified: Secondary | ICD-10-CM | POA: Diagnosis not present

## 2019-06-20 DIAGNOSIS — Z87891 Personal history of nicotine dependence: Secondary | ICD-10-CM | POA: Diagnosis not present

## 2019-06-20 DIAGNOSIS — M7989 Other specified soft tissue disorders: Secondary | ICD-10-CM

## 2019-06-20 DIAGNOSIS — R21 Rash and other nonspecific skin eruption: Secondary | ICD-10-CM | POA: Diagnosis not present

## 2019-06-20 DIAGNOSIS — J690 Pneumonitis due to inhalation of food and vomit: Secondary | ICD-10-CM | POA: Diagnosis present

## 2019-06-20 DIAGNOSIS — Z20828 Contact with and (suspected) exposure to other viral communicable diseases: Secondary | ICD-10-CM | POA: Diagnosis not present

## 2019-06-20 DIAGNOSIS — R232 Flushing: Secondary | ICD-10-CM | POA: Insufficient documentation

## 2019-06-20 DIAGNOSIS — C3490 Malignant neoplasm of unspecified part of unspecified bronchus or lung: Secondary | ICD-10-CM | POA: Diagnosis not present

## 2019-06-20 LAB — CBC WITH DIFFERENTIAL/PLATELET
Abs Immature Granulocytes: 0.04 10*3/uL (ref 0.00–0.07)
Basophils Absolute: 0 10*3/uL (ref 0.0–0.1)
Basophils Relative: 0 %
Eosinophils Absolute: 0.1 10*3/uL (ref 0.0–0.5)
Eosinophils Relative: 1 %
HCT: 41.7 % (ref 39.0–52.0)
Hemoglobin: 12.6 g/dL — ABNORMAL LOW (ref 13.0–17.0)
Immature Granulocytes: 0 %
Lymphocytes Relative: 8 %
Lymphs Abs: 0.8 10*3/uL (ref 0.7–4.0)
MCH: 26.2 pg (ref 26.0–34.0)
MCHC: 30.2 g/dL (ref 30.0–36.0)
MCV: 86.7 fL (ref 80.0–100.0)
Monocytes Absolute: 0.3 10*3/uL (ref 0.1–1.0)
Monocytes Relative: 3 %
Neutro Abs: 8.6 10*3/uL — ABNORMAL HIGH (ref 1.7–7.7)
Neutrophils Relative %: 88 %
Platelets: 179 10*3/uL (ref 150–400)
RBC: 4.81 MIL/uL (ref 4.22–5.81)
RDW: 14.1 % (ref 11.5–15.5)
WBC: 9.9 10*3/uL (ref 4.0–10.5)
nRBC: 0 % (ref 0.0–0.2)

## 2019-06-20 LAB — BASIC METABOLIC PANEL
Anion gap: 12 (ref 5–15)
BUN: 23 mg/dL (ref 8–23)
CO2: 23 mmol/L (ref 22–32)
Calcium: 8.4 mg/dL — ABNORMAL LOW (ref 8.9–10.3)
Chloride: 100 mmol/L (ref 98–111)
Creatinine, Ser: 0.91 mg/dL (ref 0.61–1.24)
GFR calc Af Amer: >60 mL/min (ref >60)
GFR calc non Af Amer: >60 mL/min (ref >60)
Glucose, Bld: 132 mg/dL — ABNORMAL HIGH (ref 70–99)
Potassium: 3.4 mmol/L — ABNORMAL LOW (ref 3.5–5.1)
Sodium: 135 mmol/L (ref 135–145)

## 2019-06-20 LAB — SARS CORONAVIRUS 2 BY RT PCR (HOSPITAL ORDER, PERFORMED IN ~~LOC~~ HOSPITAL LAB): SARS Coronavirus 2: NEGATIVE

## 2019-06-20 LAB — TSH: TSH: 0.323 u[IU]/mL — ABNORMAL LOW (ref 0.350–4.500)

## 2019-06-20 MED ORDER — IOHEXOL 350 MG/ML SOLN
100.0000 mL | Freq: Once | INTRAVENOUS | Status: AC | PRN
  Administered 2019-06-20: 100 mL via INTRAVENOUS

## 2019-06-20 MED ORDER — METHYLPREDNISOLONE 4 MG PO TBPK
8.0000 mg | ORAL_TABLET | Freq: Every evening | ORAL | Status: AC
  Administered 2019-06-20: 8 mg via ORAL

## 2019-06-20 MED ORDER — DIPHENHYDRAMINE HCL 25 MG PO CAPS
25.0000 mg | ORAL_CAPSULE | Freq: Once | ORAL | Status: AC
  Administered 2019-06-20: 25 mg via ORAL
  Filled 2019-06-20: qty 1

## 2019-06-20 MED ORDER — FAMOTIDINE 20 MG PO TABS
20.0000 mg | ORAL_TABLET | Freq: Once | ORAL | Status: AC
  Administered 2019-06-20: 15:00:00 20 mg via ORAL
  Filled 2019-06-20: qty 1

## 2019-06-20 MED ORDER — SODIUM CHLORIDE 0.9 % IV SOLN
500.0000 mg | INTRAVENOUS | Status: DC
  Administered 2019-06-21: 500 mg via INTRAVENOUS
  Filled 2019-06-20: qty 500

## 2019-06-20 MED ORDER — FOLIC ACID 1 MG PO TABS
1.0000 mg | ORAL_TABLET | Freq: Every day | ORAL | Status: DC
  Administered 2019-06-20 – 2019-06-21 (×2): 1 mg via ORAL
  Filled 2019-06-20 (×2): qty 1

## 2019-06-20 MED ORDER — METHYLPREDNISOLONE 4 MG PO TBPK: 8.0000 mg | ORAL_TABLET | Freq: Every evening | ORAL | Status: DC

## 2019-06-20 MED ORDER — METHYLPREDNISOLONE 4 MG PO TBPK: ORAL_TABLET | Freq: Every day | ORAL | Status: DC

## 2019-06-20 MED ORDER — SODIUM CHLORIDE 0.9 % IV SOLN
2.0000 g | INTRAVENOUS | Status: DC
  Administered 2019-06-21: 2 g via INTRAVENOUS
  Filled 2019-06-20: qty 2

## 2019-06-20 MED ORDER — RIVAROXABAN 15 MG PO TABS
15.0000 mg | ORAL_TABLET | ORAL | Status: DC
  Filled 2019-06-20: qty 1

## 2019-06-20 MED ORDER — PANTOPRAZOLE SODIUM 40 MG PO TBEC
40.0000 mg | DELAYED_RELEASE_TABLET | Freq: Every day | ORAL | Status: DC
  Administered 2019-06-21: 40 mg via ORAL
  Filled 2019-06-20: qty 1

## 2019-06-20 MED ORDER — METHYLPREDNISOLONE 4 MG PO TBPK
4.0000 mg | ORAL_TABLET | Freq: Three times a day (TID) | ORAL | Status: AC
  Administered 2019-06-21 (×3): 4 mg via ORAL

## 2019-06-20 MED ORDER — SODIUM CHLORIDE (PF) 0.9 % IJ SOLN
INTRAMUSCULAR | Status: AC
  Filled 2019-06-20: qty 50

## 2019-06-20 MED ORDER — RIVAROXABAN 15 MG PO TABS
15.0000 mg | ORAL_TABLET | Freq: Two times a day (BID) | ORAL | Status: DC
  Administered 2019-06-20 – 2019-06-21 (×3): 15 mg via ORAL
  Filled 2019-06-20 (×5): qty 1

## 2019-06-20 MED ORDER — METHYLPREDNISOLONE 4 MG PO TBPK
8.0000 mg | ORAL_TABLET | Freq: Every morning | ORAL | Status: AC
  Administered 2019-06-20: 8 mg via ORAL
  Filled 2019-06-20: qty 21

## 2019-06-20 MED ORDER — RIVAROXABAN (XARELTO) EDUCATION KIT FOR DVT/PE PATIENTS
PACK | Freq: Once | Status: DC
  Filled 2019-06-20: qty 1

## 2019-06-20 MED ORDER — SODIUM CHLORIDE 0.9 % IV BOLUS
500.0000 mL | Freq: Once | INTRAVENOUS | Status: AC
  Administered 2019-06-20: 12:00:00 500 mL via INTRAVENOUS

## 2019-06-20 MED ORDER — METHYLPREDNISOLONE 4 MG PO TBPK: 4.0000 mg | ORAL_TABLET | Freq: Four times a day (QID) | ORAL | Status: DC

## 2019-06-20 MED ORDER — METHYLPREDNISOLONE 4 MG PO TBPK
4.0000 mg | ORAL_TABLET | ORAL | Status: AC
  Administered 2019-06-20: 20:00:00 4 mg via ORAL

## 2019-06-20 MED ORDER — SODIUM CHLORIDE 0.9 % IV SOLN
INTRAVENOUS | Status: DC
  Administered 2019-06-20 (×2): via INTRAVENOUS

## 2019-06-20 MED ORDER — ACETAMINOPHEN 325 MG PO TABS
650.0000 mg | ORAL_TABLET | Freq: Four times a day (QID) | ORAL | Status: DC | PRN
  Administered 2019-06-21: 650 mg via ORAL
  Filled 2019-06-20: qty 2

## 2019-06-20 MED ORDER — PROCHLORPERAZINE MALEATE 10 MG PO TABS: 10.0000 mg | ORAL_TABLET | Freq: Four times a day (QID) | ORAL | Status: DC | PRN

## 2019-06-20 MED ORDER — BENZONATATE 100 MG PO CAPS
200.0000 mg | ORAL_CAPSULE | Freq: Three times a day (TID) | ORAL | Status: DC | PRN
  Administered 2019-06-20 – 2019-06-21 (×3): 200 mg via ORAL
  Filled 2019-06-20 (×3): qty 2

## 2019-06-20 MED ORDER — HYDROCODONE-ACETAMINOPHEN 5-325 MG PO TABS
1.0000 | ORAL_TABLET | Freq: Four times a day (QID) | ORAL | Status: DC | PRN
  Administered 2019-06-20: 1 via ORAL
  Filled 2019-06-20: qty 1

## 2019-06-20 MED ORDER — PRAVASTATIN SODIUM 20 MG PO TABS
20.0000 mg | ORAL_TABLET | Freq: Every day | ORAL | Status: DC
  Administered 2019-06-21: 20 mg via ORAL
  Filled 2019-06-20: qty 1

## 2019-06-20 MED ORDER — RIVAROXABAN 15 MG PO TABS
15.0000 mg | ORAL_TABLET | Freq: Two times a day (BID) | ORAL | Status: DC
  Filled 2019-06-20: qty 1

## 2019-06-20 MED ORDER — SODIUM CHLORIDE 0.9 % IV SOLN
1.0000 g | Freq: Once | INTRAVENOUS | Status: AC
  Administered 2019-06-20: 14:00:00 1 g via INTRAVENOUS
  Filled 2019-06-20: qty 10

## 2019-06-20 MED ORDER — SODIUM CHLORIDE 0.9 % IV SOLN
500.0000 mg | Freq: Once | INTRAVENOUS | Status: AC
  Administered 2019-06-20: 16:00:00 500 mg via INTRAVENOUS
  Filled 2019-06-20: qty 500

## 2019-06-20 NOTE — ED Notes (Signed)
Report given to Chris, RN

## 2019-06-20 NOTE — H&P (Signed)
History and Physical    Kyle Garcia KNL:[id number] DOB: 11/22/1951 DOA: 06/20/2019  PCP: Susy Frizzle, MD  Patient coming from: Home  I have personally briefly reviewed patient's old medical records in [place]  Chief Complaint: Right leg pain  HPI: Kyle Garcia is a 68 y.o. male with medical history significant of stage IV adenocarcinoma of right lung Started chemotherapy on 06/17/2019, GERD, hyperlipidemia, recent DVT on Xarelto presented to ER with a complaint of right leg pain.  According to patient, he was diagnosed with right lower extremity DVT in April and he was placed on Xarelto for that.  He took that for about 2 months and then stopped it.  He then started having right lower extremity pain and swelling starting Tuesday this week.  He went to see his oncologist to get his first chemo on Wednesday.  He mentioned his complaint to his oncologist and was put back on Xarelto which he started taking again on Thursday however his pain and swelling continued to get worse so he came to the emergency department for further care.  Patient denies any chest pain, shortness of breath, nausea, vomiting, chills however he has been having night sweats (he has been told that this is expected in his cancer) and has been having intermittent productive cough since February with yellow and brown-colored sputum as well as intermittent hemoptysis.  No sick contact or any recent travel.   ED Course: Upon arrival to the emergency department, he was slightly tachycardic otherwise hemodynamically stable.  CBC and CMP was within normal limits.  Doppler lower extremity was done which showed progression of the right lower extremity DVT.  CT angiogram of the chest was done and he was ruled out of PE however it showed multifocal pneumonia, mostly located in right middle and lower lobe.  He was given IV Rocephin and Zithromax.  He was tested negative for COVID.  Hospital service was consulted for  admission.  Review of Systems: As per HPI otherwise 10 point review of systems negative.    Past Medical History:  Diagnosis Date   Cancer [facility])    stage 4 adenocarcinoma right lung   GERD (gastroesophageal reflux disease)    Hyperlipidemia    Stroke ([place])    25-30 emboli seen on screening MRI    Past Surgical History:  Procedure Laterality Date   CATARACT EXTRACTION  2016   EYE SURGERY     VIDEO BRONCHOSCOPY Bilateral 05/21/2019   Procedure: VIDEO BRONCHOSCOPY WITH FLUORO;  Surgeon: Collene Gobble, MD;  Location: [facility];  Service: Cardiopulmonary;  Laterality: Bilateral;     reports that he quit smoking about 4 months ago. His smoking use included cigars. He has never used smokeless tobacco. He reports current alcohol use of about 4.0 - 7.0 standard drinks of alcohol per week. He reports current drug use.  No Known Allergies  No family history on file.  Prior to Admission medications   Medication Sig Start Date End Date Taking? Authorizing Provider  acetaminophen (TYLENOL) 500 MG tablet Take 1,000 mg by mouth every 6 (six) hours as needed for headache.   Yes [provider]  folic acid (FOLVITE) 1 MG tablet Take 1 tablet (1 mg total) by mouth daily. 06/08/19  Yes Curt Bears, MD  Multiple Vitamins-Minerals (MULTIVITAMIN WITH MINERALS) tablet Take 1 tablet by mouth daily.  05/19/10  Yes [provider]  Multiple Vitamins-Minerals (PRESERVISION AREDS 2) CAPS Take 1 capsule by mouth 2 (two) times daily.  Yes [provider]  omeprazole (PRILOSEC) 40 MG capsule Take 1 capsule (40 mg total) by mouth daily. 12/30/18  Yes Susy Frizzle, MD  Polyvinyl Alcohol-Povidone (REFRESH OP) Place 1 drop into both eyes every evening.   Yes [provider]  pravastatin (PRAVACHOL) 20 MG tablet TAKE 1 TABLET DAILY AT 6 PM Patient taking differently: Take 20 mg by mouth daily at 6 PM.  02/18/19  Yes Pickard, Cammie Mcgee, MD  Rivaroxaban 15 & 20 MG  TBPK Take as directed on package: Start with one 15mg  tablet by mouth twice a day with food. On Day 22, switch to one 20mg  tablet once a day with food. 06/17/19  Yes Heilingoetter, Cassandra L, PA-C  methylPREDNISolone (MEDROL DOSEPAK) 4 MG TBPK tablet Take per package instructions Patient not taking: Reported on 06/20/2019 06/12/19   Curt Bears, MD  prochlorperazine (COMPAZINE) 10 MG tablet Take 1 tablet (10 mg total) by mouth every 6 (six) hours as needed for nausea or vomiting. 06/08/19   Curt Bears, MD    Physical Exam: Vitals:   06/20/19 1120 06/20/19 1130 06/20/19 1200 06/20/19 1300  BP: 119/89 121/76 121/69 115/68  Pulse: (!) 114 (!) 105 95 95  Resp:  16 19 18   Temp:      TempSrc:      SpO2: 99% 98% 99% 100%    Constitutional: NAD, calm, comfortable Vitals:   06/20/19 1120 06/20/19 1130 06/20/19 1200 06/20/19 1300  BP: 119/89 121/76 121/69 115/68  Pulse: (!) 114 (!) 105 95 95  Resp:  16 19 18   Temp:      TempSrc:      SpO2: 99% 98% 99% 100%   Eyes: PERRL, lids and conjunctivae normal ENMT: Mucous membranes are moist. Posterior pharynx clear of any exudate or lesions.Normal dentition.  Neck: normal, supple, no masses, no thyromegaly Respiratory: clear to auscultation bilaterally, no wheezing, no crackles. Normal respiratory effort. No accessory muscle use.  Cardiovascular: Regular rate and rhythm, no murmurs / rubs / gallops. No extremity edema. 2+ pedal pulses. No carotid bruits.  Abdomen: no tenderness, no masses palpated. No hepatosplenomegaly. Bowel sounds positive.  Musculoskeletal: no clubbing / cyanosis. No joint deformity upper and lower extremities. Good ROM, no contractures. Normal muscle tone.  Right lower extremity swelling compared to the left.  No erythema or tenderness. Skin: no rashes, lesions, ulcers. No induration Neurologic: CN 2-12 grossly intact. Sensation intact, DTR normal. Strength 5/5 in all 4.  Psychiatric: Normal judgment and insight. Alert  and oriented x 3. Normal mood.    Labs on Admission: I have personally reviewed following labs and imaging studies  CBC: Recent Labs  Lab 06/17/19 1137 06/20/19 1131  WBC 12.7* 9.9  NEUTROABS 11.1* 8.6*  HGB 13.2 12.6*  HCT 42.4 41.7  MCV 85.5 86.7  PLT 183 993   Basic Metabolic Panel: Recent Labs  Lab 06/17/19 1137 06/20/19 1131  NA 140 135  K 4.0 3.4*  CL 103 100  CO2 26 23  GLUCOSE 99 132*  BUN 15 23  CREATININE 0.90 0.91  CALCIUM 8.6* 8.4*   GFR: Estimated Creatinine Clearance: 74.1 mL/min (by C-G formula based on SCr of 0.91 mg/dL). Liver Function Tests: Recent Labs  Lab 06/17/19 1137  AST 16  ALT 33  ALKPHOS 139*  BILITOT 0.4  PROT 7.0  ALBUMIN 3.5   No results for input(s): LIPASE, AMYLASE in the last 168 hours. No results for input(s): AMMONIA in the last 168 hours. Coagulation Profile: No results  for input(s): INR, PROTIME in the last 168 hours. Cardiac Enzymes: No results for input(s): CKTOTAL, CKMB, CKMBINDEX, TROPONINI in the last 168 hours. BNP (last 3 results) No results for input(s): PROBNP in the last 8760 hours. HbA1C: No results for input(s): HGBA1C in the last 72 hours. CBG: No results for input(s): GLUCAP in the last 168 hours. Lipid Profile: No results for input(s): CHOL, HDL, LDLCALC, TRIG, CHOLHDL, LDLDIRECT in the last 72 hours. Thyroid Function Tests: No results for input(s): TSH, T4TOTAL, FREET4, T3FREE, THYROIDAB in the last 72 hours. Anemia Panel: No results for input(s): VITAMINB12, FOLATE, FERRITIN, TIBC, IRON, RETICCTPCT in the last 72 hours. Urine analysis:    Component Value Date/Time   COLORURINE YELLOW 12/02/2018 0906   APPEARANCEUR CLEAR 12/02/2018 0906   LABSPEC 1.004 12/02/2018 0906   PHURINE 7.0 12/02/2018 0906   GLUCOSEU NEGATIVE 12/02/2018 0906   HGBUR NEGATIVE 12/02/2018 0906   KETONESUR NEGATIVE 12/02/2018 0906   PROTEINUR NEGATIVE 12/02/2018 0906   NITRITE NEGATIVE 12/02/2018 0906   LEUKOCYTESUR  NEGATIVE 12/02/2018 0906    Radiological Exams on Admission: Ct Angio Chest Pe W And/or Wo Contrast  Result Date: 06/20/2019 CLINICAL DATA:  Hemoptysis.  Lung carcinoma. EXAM: CT ANGIOGRAPHY CHEST WITH CONTRAST TECHNIQUE: Multidetector CT imaging of the chest was performed using the standard protocol during bolus administration of intravenous contrast. Multiplanar CT image reconstructions and MIPs were obtained to evaluate the vascular anatomy. CONTRAST:  160mL OMNIPAQUE IOHEXOL 350 MG/ML SOLN COMPARISON:  PET CT May 06, 2019; CT angiogram chest April 14, 2019 FINDINGS: Cardiovascular: There is no demonstrable pulmonary embolus. There is no thoracic aortic aneurysm or dissection. The visualized great vessels appear normal. There is no pericardial thickening. There is a minimal pericardial effusion with fluid in the pericardium likely within physiologic range. Mediastinum/Nodes: Thyroid appears normal. There is a left hilar lymph node measuring 1.7 x 1.4 cm, slightly larger than on prior CT angiogram chest from 2 months prior there are multiple subcentimeter lymph nodes in the thoracic region which appear essentially stable. Subcentimeter lymph nodes in the left supraclavicular region also appear stable. No esophageal lesions are appreciable. Lungs/Pleura: There is extensive airspace consolidation throughout much of the right lower lobe consistent with pneumonia. There is consolidation in the periphery of the lateral segment right middle lobe. There are areas of patchy airspace disease in each upper lobe, more on the right than on the left. Previous masslike area in the posterior segment of the right upper lobe is less well delineated at this time, currently measuring 9 x 7 mm. This now ill-defined opacity is best seen on axial slice 62 series 6. There is bronchiectatic change in the right upper lobe, essentially stable. There is subtle patchy infiltrate at several areas in the left lower lobe. There is no  appreciable pleural effusion. Upper Abdomen: There is a cyst in the left lobe of the liver anteriorly near the dome measuring 1.6 x 1.3 cm, stable. A 5 mm probable cyst is noted nearby. Visualized upper abdominal structures otherwise appear unremarkable. Musculoskeletal: There are no blastic or lytic bone lesions. No chest wall lesions evident. Review of the MIP images confirms the above findings. IMPRESSION: 1. No demonstrable pulmonary embolus. No thoracic aortic aneurysm or dissection. 2. Multifocal airspace opacity consistent with, most pronounced in the right lower lobe and a portion of the lateral segment right middle lobe. 3. In comparison with prior PET study, right upper lobe nodular lesion is smaller and less well-defined. 4. Slight increase in size  of left hilar lymph node, known by PET to have increased metabolic activity. Neoplastic involvement of this lymph node is felt to be present. 5.  Stable bronchiectatic change in the right upper lobe. Electronically Signed   By: Lowella Grip III M.D.   On: 06/20/2019 13:03   Vas [place] Lower Extremity Venous (dvt) (only Mc & Wl 7a-7p)  Result Date: 06/20/2019  Lower Venous Study Indications: Swelling.  Risk Factors: Cancer. Anticoagulation: Xarelto. Comparison Study: 04/15/19 - Positive for DVT R PTV and SVT GSV Performing Technologist: Oliver Hum RVT  Examination Guidelines: A complete evaluation includes B-mode imaging, spectral Doppler, color Doppler, and power Doppler as needed of all accessible portions of each vessel. Bilateral testing is considered an integral part of a complete examination. Limited examinations for reoccurring indications may be performed as noted.  +---------+---------------+---------+-----------+----------+-------+  RIGHT     Compressibility Phasicity Spontaneity Properties Summary  +---------+---------------+---------+-----------+----------+-------+  CFV       None            No        No                     Acute     +---------+---------------+---------+-----------+----------+-------+  SFJ       None                                             Acute    +---------+---------------+---------+-----------+----------+-------+  FV Prox   None            No        No                     Acute    +---------+---------------+---------+-----------+----------+-------+  FV Mid    None            No        No                     Acute    +---------+---------------+---------+-----------+----------+-------+  FV Distal None            No        No                     Acute    +---------+---------------+---------+-----------+----------+-------+  PFV       None            No        No                     Acute    +---------+---------------+---------+-----------+----------+-------+  POP       None            No        No                     Acute    +---------+---------------+---------+-----------+----------+-------+  PTV       Full                                                      +---------+---------------+---------+-----------+----------+-------+  PERO      Partial  Acute    +---------+---------------+---------+-----------+----------+-------+  Gastroc   Full                                                      +---------+---------------+---------+-----------+----------+-------+  GSV       None                                                      +---------+---------------+---------+-----------+----------+-------+  SSV       None                                                      +---------+---------------+---------+-----------+----------+-------+  EIV       Full            No        No                              +---------+---------------+---------+-----------+----------+-------+  CIV       Full            No        No                              +---------+---------------+---------+-----------+----------+-------+   +----+---------------+---------+-----------+----------+-------+   LEFT Compressibility Phasicity Spontaneity Properties Summary  +----+---------------+---------+-----------+----------+-------+  CFV  Full            Yes       Yes                             +----+---------------+---------+-----------+----------+-------+     Summary: Right: Findings consistent with acute deep vein thrombosis involving the right common femoral vein, right femoral vein, right proximal profunda vein, right popliteal vein, and right peroneal veins. Findings consistent with acute superficial vein thrombosis involving the right great saphenous vein, and right small saphenous vein. No cystic structure found in the popliteal fossa. There is no evidence of DVT involving the external iliac, and common illiac veins. Left: No evidence of common femoral vein obstruction. No cystic structure found in the popliteal fossa.  *See table(s) above for measurements and observations.    Preliminary     Assessment/Plan Active Problems:   GERD without esophagitis   Multifocal pneumonia   DVT (deep venous thrombosis) (HCC)   Adenocarcinoma of right lung, stage 4 (HCC)    Multifocal community-acquired pneumonia: Patient received Rocephin and Zithromax in the ED.  I will continue the same.  Follow blood culture.  Check respiratory viral panel, sputum culture, urine antigen for Legionella and streptococci.  Patient is not hypoxic. COVID 19 negative.  Worsening of acute right lower extremity DVT: We will continue his home dose of Xarelto for now and monitor closely.  It is challenging to say whether he failed this type of anticoagulation as there was a gap of about a week or so that patient did not  take his Xarelto.  He is hemodynamically stable at this point in time.  Stage IV adenocarcinoma of lung: Under treatment with chemo by oncology.  This is stable.  I do not see any indication of consulting oncology at this point in time as I hope that he will be discharged back to home in 1 to 2 days.  GERD: Resume  omeprazole.  Hyperlipidemia: Resume pravastatin.  DVT prophylaxis: Xarelto Code Status: Full code Family Communication: None present at bedside.  Patient alert, oriented and competent.  Discussed plan of admission with the patient in detail. Disposition Plan: Likely home in next 24 to 48 hours. Consults called: None Admission status: Observation   Darliss Cheney MD [facility] Pager [phone]  If 7PM-7AM, please contact night-coverage [email or web address] Password [email or web address]  06/20/2019, 1:44 PM

## 2019-06-20 NOTE — ED Notes (Signed)
EDP at bedside  

## 2019-06-20 NOTE — ED Notes (Signed)
ED TO INPATIENT HANDOFF REPORT  ED Nurse Name and Phone #: [place] G, [phone]  S Name/Age/Gender Kyle Garcia 68 y.o. male Room/Bed: WA23/WA23  Code Status   Code Status: Not on file  Home/SNF/Other Home Patient oriented to: self, place, time and situation Is this baseline? Yes   Triage Complete: Triage complete  Chief Complaint leg swelling  Triage Note Patient c/o right leg swelling since yesterday. Hx clots. Recently restarted taking xarelto. Just started chemo to treat lung cancer.    Allergies No Known Allergies  Level of Care/Admitting Diagnosis ED Disposition    ED Disposition Condition [facility] Area: [place] [[id number]]  Level of Care: Telemetry [5]  Admit to tele based on following criteria: Other see comments  Comments: lung cancer with pna  Covid Evaluation: Person Under Investigation (PUI)  Isolation Risk Level: Low Risk/Droplet (Less than 4L [place] supplementation)  Diagnosis: CAP (community acquired pneumonia) [509326]  Admitting Physician: Darliss Cheney [[id number]]  Attending Physician: Darliss Cheney [[id number]]  PT Class (Do Not Modify): Observation [104]  PT Acc Code (Do Not Modify): Observation [10022]       B Medical/Surgery History Past Medical History:  Diagnosis Date  . Cancer ([place])    stage 4 adenocarcinoma right lung  . GERD (gastroesophageal reflux disease)   . Hyperlipidemia   . Stroke [facility])    25-30 emboli seen on screening MRI   Past Surgical History:  Procedure Laterality Date  . CATARACT EXTRACTION  2016  . EYE SURGERY    . VIDEO BRONCHOSCOPY Bilateral 05/21/2019   Procedure: VIDEO BRONCHOSCOPY WITH FLUORO;  Surgeon: Collene Gobble, MD;  Location: [facility] ENDOSCOPY;  Service: Cardiopulmonary;  Laterality: Bilateral;     A IV Location/Drains/Wounds Patient Lines/Drains/Airways Status   Active Line/Drains/Airways    Name:   Placement date:   Placement time:   Site:   Days:   Peripheral IV  06/17/19 Left;Posterior Forearm   06/17/19    1300    Forearm   3   Peripheral IV 06/20/19 Left Antecubital   06/20/19    1130    Antecubital   less than 1   Peripheral IV 06/20/19 Right Antecubital   06/20/19    1410    Antecubital   less than 1          Intake/Output Last 24 hours  Intake/Output Summary (Last 24 hours) at 06/20/2019 1925 Last data filed at 06/20/2019 1813 Gross per 24 hour  Intake -  Output 280 ml  Net -280 ml    Labs/Imaging Results for orders placed or performed during the hospital encounter of 06/20/19 (from the past 48 hour(s))  CBC with Differential     Status: Abnormal   Collection Time: 06/20/19 11:31 AM  Result Value Ref Range   WBC 9.9 4.0 - 10.5 K/uL   RBC 4.81 4.22 - 5.81 MIL/uL   Hemoglobin 12.6 (L) 13.0 - 17.0 g/dL   HCT 41.7 39.0 - 52.0 %   MCV 86.7 80.0 - 100.0 fL   MCH 26.2 26.0 - 34.0 pg   MCHC 30.2 30.0 - 36.0 g/dL   RDW 14.1 11.5 - 15.5 %   Platelets 179 150 - 400 K/uL   nRBC 0.0 0.0 - 0.2 %   Neutrophils Relative % 88 %   Neutro Abs 8.6 (H) 1.7 - 7.7 K/uL   Lymphocytes Relative 8 %   Lymphs Abs 0.8 0.7 - 4.0 K/uL   Monocytes Relative 3 %  Monocytes Absolute 0.3 0.1 - 1.0 K/uL   Eosinophils Relative 1 %   Eosinophils Absolute 0.1 0.0 - 0.5 K/uL   Basophils Relative 0 %   Basophils Absolute 0.0 0.0 - 0.1 K/uL   Immature Granulocytes 0 %   Abs Immature Granulocytes 0.04 0.00 - 0.07 K/uL    Comment: Performed at [facility], [place] [address] [place], [place] [address]  Basic metabolic panel     Status: Abnormal   Collection Time: 06/20/19 11:31 AM  Result Value Ref Range   Sodium 135 135 - 145 mmol/L   Potassium 3.4 (L) 3.5 - 5.1 mmol/L   Chloride 100 98 - 111 mmol/L   CO2 23 22 - 32 mmol/L   Glucose, Bld 132 (H) 70 - 99 mg/dL   BUN 23 8 - 23 mg/dL   Creatinine, Ser 0.91 0.61 - 1.24 mg/dL   Calcium 8.4 (L) 8.9 - 10.3 mg/dL   GFR calc non Af Amer >60 >60 mL/min   GFR calc Af Amer >60 >60 mL/min   Anion gap  12 5 - 15    Comment: Performed at [facility], [place] [address] [place], [place] [address]  SARS Coronavirus 2 (CEPHEID- Performed in [place] hospital lab), Hosp Order     Status: None   Collection Time: 06/20/19  1:14 PM   Specimen: Nasopharyngeal Swab  Result Value Ref Range   SARS Coronavirus 2 NEGATIVE NEGATIVE    Comment: (NOTE) If result is NEGATIVE SARS-CoV-2 target nucleic acids are NOT DETECTED. The SARS-CoV-2 RNA is generally detectable in upper and lower  respiratory specimens during the acute phase of infection. The lowest  concentration of SARS-CoV-2 viral copies this assay can detect is 250  copies / mL. A negative result does not preclude SARS-CoV-2 infection  and should not be used as the sole basis for treatment or other  patient management decisions.  A negative result may occur with  improper specimen collection / handling, submission of specimen other  than nasopharyngeal swab, presence of viral mutation(s) within the  areas targeted by this assay, and inadequate number of viral copies  (<250 copies / mL). A negative result must be combined with clinical  observations, patient history, and epidemiological information. If result is POSITIVE SARS-CoV-2 target nucleic acids are DETECTED. The SARS-CoV-2 RNA is generally detectable in upper and lower  respiratory specimens dur ing the acute phase of infection.  Positive  results are indicative of active infection with SARS-CoV-2.  Clinical  correlation with patient history and other diagnostic information is  necessary to determine patient infection status.  Positive results do  not rule out bacterial infection or co-infection with other viruses. If result is PRESUMPTIVE POSTIVE SARS-CoV-2 nucleic acids MAY BE PRESENT.   A presumptive positive result was obtained on the submitted specimen  and confirmed on repeat testing.  While 2019 novel coronavirus  (SARS-CoV-2) nucleic acids may be present  in the submitted sample  additional confirmatory testing may be necessary for epidemiological  and / or clinical management purposes  to differentiate between  SARS-CoV-2 and other Sarbecovirus currently known to infect humans.  If clinically indicated additional testing with an alternate test  methodology [phone]) is advised. The SARS-CoV-2 RNA is generally  detectable in upper and lower respiratory sp ecimens during the acute  phase of infection. The expected result is Negative. Fact Sheet for Patients:  StrictlyIdeas.no Fact Sheet for Healthcare Providers: BankingDealers.co.za This test is not yet approved or cleared  by the [place] and has been authorized for detection and/or diagnosis of SARS-CoV-2 by FDA under an Emergency Use Authorization (EUA).  This EUA will remain in effect (meaning this test can be used) for the duration of the COVID-19 declaration under Section 564(b)(1) of the Act, 21 U.S.C. section 360bbb-3(b)(1), unless the authorization is terminated or revoked sooner. Performed at [facility], [place] [address] [place], [place] [address]    Ct Angio Chest Pe W And/or Wo Contrast  Result Date: 06/20/2019 CLINICAL DATA:  Hemoptysis.  Lung carcinoma. EXAM: CT ANGIOGRAPHY CHEST WITH CONTRAST TECHNIQUE: Multidetector CT imaging of the chest was performed using the standard protocol during bolus administration of intravenous contrast. Multiplanar CT image reconstructions and MIPs were obtained to evaluate the vascular anatomy. CONTRAST:  136mL OMNIPAQUE IOHEXOL 350 MG/ML SOLN COMPARISON:  PET CT May 06, 2019; CT angiogram chest April 14, 2019 FINDINGS: Cardiovascular: There is no demonstrable pulmonary embolus. There is no thoracic aortic aneurysm or dissection. The visualized great vessels appear normal. There is no pericardial thickening. There is a minimal pericardial effusion with fluid in the  pericardium likely within physiologic range. Mediastinum/Nodes: Thyroid appears normal. There is a left hilar lymph node measuring 1.7 x 1.4 cm, slightly larger than on prior CT angiogram chest from 2 months prior there are multiple subcentimeter lymph nodes in the thoracic region which appear essentially stable. Subcentimeter lymph nodes in the left supraclavicular region also appear stable. No esophageal lesions are appreciable. Lungs/Pleura: There is extensive airspace consolidation throughout much of the right lower lobe consistent with pneumonia. There is consolidation in the periphery of the lateral segment right middle lobe. There are areas of patchy airspace disease in each upper lobe, more on the right than on the left. Previous masslike area in the posterior segment of the right upper lobe is less well delineated at this time, currently measuring 9 x 7 mm. This now ill-defined opacity is best seen on axial slice 62 series 6. There is bronchiectatic change in the right upper lobe, essentially stable. There is subtle patchy infiltrate at several areas in the left lower lobe. There is no appreciable pleural effusion. Upper Abdomen: There is a cyst in the left lobe of the liver anteriorly near the dome measuring 1.6 x 1.3 cm, stable. A 5 mm probable cyst is noted nearby. Visualized upper abdominal structures otherwise appear unremarkable. Musculoskeletal: There are no blastic or lytic bone lesions. No chest wall lesions evident. Review of the MIP images confirms the above findings. IMPRESSION: 1. No demonstrable pulmonary embolus. No thoracic aortic aneurysm or dissection. 2. Multifocal airspace opacity consistent with, most pronounced in the right lower lobe and a portion of the lateral segment right middle lobe. 3. In comparison with prior PET study, right upper lobe nodular lesion is smaller and less well-defined. 4. Slight increase in size of left hilar lymph node, known by PET to have increased metabolic  activity. Neoplastic involvement of this lymph node is felt to be present. 5.  Stable bronchiectatic change in the right upper lobe. Electronically Signed   By: Lowella Grip III M.D.   On: 06/20/2019 13:03   Vas [place] Lower Extremity Venous (dvt) (only Mc & Wl 7a-7p)  Result Date: 06/20/2019  Lower Venous Study Indications: Swelling.  Risk Factors: Cancer. Anticoagulation: Xarelto. Comparison Study: 04/15/19 - Positive for DVT R PTV and SVT GSV Performing Technologist: Oliver Hum RVT  Examination Guidelines: A complete evaluation includes B-mode imaging, spectral Doppler, color Doppler, and  power Doppler as needed of all accessible portions of each vessel. Bilateral testing is considered an integral part of a complete examination. Limited examinations for reoccurring indications may be performed as noted.  +---------+---------------+---------+-----------+----------+-------+ RIGHT    CompressibilityPhasicitySpontaneityPropertiesSummary +---------+---------------+---------+-----------+----------+-------+ CFV      None           No       No                   Acute   +---------+---------------+---------+-----------+----------+-------+ SFJ      None                                         Acute   +---------+---------------+---------+-----------+----------+-------+ FV Prox  None           No       No                   Acute   +---------+---------------+---------+-----------+----------+-------+ FV Mid   None           No       No                   Acute   +---------+---------------+---------+-----------+----------+-------+ FV DistalNone           No       No                   Acute   +---------+---------------+---------+-----------+----------+-------+ PFV      None           No       No                   Acute   +---------+---------------+---------+-----------+----------+-------+ POP      None           No       No                   Acute    +---------+---------------+---------+-----------+----------+-------+ PTV      Full                                                 +---------+---------------+---------+-----------+----------+-------+ PERO     Partial                                      Acute   +---------+---------------+---------+-----------+----------+-------+ Gastroc  Full                                                 +---------+---------------+---------+-----------+----------+-------+ GSV      None                                                 +---------+---------------+---------+-----------+----------+-------+ SSV      None                                                 +---------+---------------+---------+-----------+----------+-------+  EIV      Full           No       No                           +---------+---------------+---------+-----------+----------+-------+ CIV      Full           No       No                           +---------+---------------+---------+-----------+----------+-------+   +----+---------------+---------+-----------+----------+-------+ LEFTCompressibilityPhasicitySpontaneityPropertiesSummary +----+---------------+---------+-----------+----------+-------+ CFV Full           Yes      Yes                          +----+---------------+---------+-----------+----------+-------+     Summary: Right: Findings consistent with acute deep vein thrombosis involving the right common femoral vein, right femoral vein, right proximal profunda vein, right popliteal vein, and right peroneal veins. Findings consistent with acute superficial vein thrombosis involving the right great saphenous vein, and right small saphenous vein. No cystic structure found in the popliteal fossa. There is no evidence of DVT involving the external iliac, and common illiac veins. Left: No evidence of common femoral vein obstruction. No cystic structure found in the popliteal fossa.  *See  table(s) above for measurements and observations.    Preliminary     Pending Labs Unresulted Labs (From admission, onward)    Start     Ordered   06/20/19 1835  HIV antibody (Routine Screening)  Once,   STAT     06/20/19 1834   06/20/19 1835  TSH  Once,   STAT     06/20/19 1834   06/20/19 1835  Strep pneumoniae urinary antigen  Once,   STAT     06/20/19 1834   06/20/19 1835  Legionella Pneumophila Serogp 1 Ur Ag  Once,   STAT     06/20/19 1834   06/20/19 1312  Blood culture (routine x 2)  BLOOD CULTURE X 2,   STAT     06/20/19 1313   Signed and Held  Respiratory Panel by PCR  (Respiratory virus panel with precautions)  Once,   R     Signed and Held   Signed and Held  Comprehensive metabolic panel  Tomorrow morning,   R     Signed and Held   Signed and Held  CBC  Tomorrow morning,   R     Signed and Held   Signed and Held  Sputum culture  (Non-severe pneumonia (non-ICU care) in adult without resistant organism risk factors.)  Once,   R     Signed and Held          Vitals/Pain Today's Vitals   06/20/19 1730 06/20/19 1800 06/20/19 1830 06/20/19 1900  BP: 118/67 104/66 117/64 (!) 108/58  Pulse: (!) 113 (!) 114 (!) 123 (!) 117  Resp: (!) 24 (!) 26 (!) 23 (!) 25  Temp:      TempSrc:      SpO2: 100% 98% 100% 99%    Isolation Precautions Airborne and Contact precautions  Medications Medications  sodium chloride (PF) 0.9 % injection (has no administration in time range)  methylPREDNISolone (MEDROL DOSEPAK) tablet (has no administration in time range)  pravastatin (PRAVACHOL) tablet 20 mg (has no administration in time range)  folic acid (FOLVITE)  tablet 1 mg (1 mg Oral Given 06/20/19 1848)  0.9 %  sodium chloride infusion ( Intravenous New Bag/Given 06/20/19 1847)  Rivaroxaban (XARELTO) tablet 15 mg (has no administration in time range)  sodium chloride 0.9 % bolus 500 mL (0 mLs Intravenous Stopped 06/20/19 1215)  iohexol (OMNIPAQUE) 350 MG/ML injection 100 mL (100 mLs  Intravenous Contrast Given 06/20/19 1225)  cefTRIAXone (ROCEPHIN) 1 g in sodium chloride 0.9 % 100 mL IVPB (0 g Intravenous Stopped 06/20/19 1456)  azithromycin (ZITHROMAX) 500 mg in sodium chloride 0.9 % 250 mL IVPB (0 mg Intravenous Stopped 06/20/19 1645)  famotidine (PEPCID) tablet 20 mg (20 mg Oral Given 06/20/19 1516)  diphenhydrAMINE (BENADRYL) capsule 25 mg (25 mg Oral Given 06/20/19 1515)    Mobility walks with person assist Low fall risk

## 2019-06-20 NOTE — ED Provider Notes (Signed)
[facility] DEPT Provider Note   CSN: [id number] Arrival date & time: 06/20/19  1055    History   Chief Complaint Chief Complaint  Patient presents with   Leg Swelling    HPI Kyle Garcia is a 68 y.o. male.     HPI   Patient is a 68 year old male with a history of stage IV adenocarcinoma of the right lung, GERD, hyperlipidemia, CVA, VTE, who presents the emergency department today for evaluation of RLE swelling. States that he was seen by his physician earlier this week and c/o rle pain. He was started on xarelto at that time. Since then he has had worsening swelling to the RLE. He has mild pain that seems worse when he is standing. Pain improves when laying. Denies chest pain or sob. Has had an intermittent cough and has had hemoptysis this week. Has had borderline temps at home of 99-100F. Denies any other sxs.   Pt dx with RLE DVT 04/13/19 and was started on Xarelto. It was discontinued 2 months later. He was seen by PCP yesterday and was restarted on Xarelto.   Past Medical History:  Diagnosis Date   Cancer [facility])    stage 4 adenocarcinoma right lung   GERD (gastroesophageal reflux disease)    Hyperlipidemia    Stroke ([place])    25-30 emboli seen on screening MRI    Patient Active Problem List   Diagnosis Date Noted   Stroke [facility])    Stroke due to embolism ([place]) 06/17/2019   Encounter for antineoplastic chemotherapy 06/05/2019   Encounter for antineoplastic immunotherapy 06/05/2019   Goals of care, counseling/discussion 06/05/2019   Adenocarcinoma of right lung, stage 4 ([place]) 06/04/2019   Cough 05/21/2019   S/P bronchoscopy with biopsy    DVT (deep venous thrombosis) ([place]) 05/08/2019   Weight loss 04/28/2019   Pneumonia involving right lung 04/10/2019   GERD without esophagitis 11/14/2015   Hyperlipidemia 11/14/2015   Benign prostatic hyperplasia 12/24/2008    Past Surgical History:  Procedure Laterality Date     CATARACT EXTRACTION  2016   EYE SURGERY     VIDEO BRONCHOSCOPY Bilateral 05/21/2019   Procedure: VIDEO BRONCHOSCOPY WITH FLUORO;  Surgeon: Collene Gobble, MD;  Location: [facility] ENDOSCOPY;  Service: Cardiopulmonary;  Laterality: Bilateral;        Home Medications    Prior to Admission medications   Medication Sig Start Date End Date Taking? Authorizing Provider  acetaminophen (TYLENOL) 500 MG tablet Take 1,000 mg by mouth every 6 (six) hours as needed for headache.   Yes [provider]  folic acid (FOLVITE) 1 MG tablet Take 1 tablet (1 mg total) by mouth daily. 06/08/19  Yes Curt Bears, MD  Multiple Vitamins-Minerals (MULTIVITAMIN WITH MINERALS) tablet Take 1 tablet by mouth daily.  05/19/10  Yes [provider]  Multiple Vitamins-Minerals (PRESERVISION AREDS 2) CAPS Take 1 capsule by mouth 2 (two) times daily.   Yes [provider]  omeprazole (PRILOSEC) 40 MG capsule Take 1 capsule (40 mg total) by mouth daily. 12/30/18  Yes Susy Frizzle, MD  Polyvinyl Alcohol-Povidone (REFRESH OP) Place 1 drop into both eyes every evening.   Yes [provider]  pravastatin (PRAVACHOL) 20 MG tablet TAKE 1 TABLET DAILY AT 6 PM Patient taking differently: Take 20 mg by mouth daily at 6 PM.  02/18/19  Yes Pickard, Cammie Mcgee, MD  Rivaroxaban 15 & 20 MG TBPK Take as directed on package: Start with one 15mg  tablet by mouth  twice a day with food. On Day 22, switch to one 20mg  tablet once a day with food. 06/17/19  Yes Heilingoetter, Cassandra L, PA-C  methylPREDNISolone (MEDROL DOSEPAK) 4 MG TBPK tablet Take per package instructions Patient not taking: Reported on 06/20/2019 06/12/19   Curt Bears, MD  prochlorperazine (COMPAZINE) 10 MG tablet Take 1 tablet (10 mg total) by mouth every 6 (six) hours as needed for nausea or vomiting. 06/08/19   Curt Bears, MD    Family History No family history on file.  Social History Social History   Tobacco Use    Smoking status: Former Smoker    Types: Cigars    Quit date: 02/07/2019    Years since quitting: 0.3   Smokeless tobacco: Never Used   Tobacco comment: occassional cigar/stopped ciggs in 1981; as of 04/28/2019, rarely smokes  Substance Use Topics   Alcohol use: Yes    Alcohol/week: 4.0 - 7.0 standard drinks    Types: 1 - 3 Glasses of wine, 3 - 4 Cans of beer per week   Drug use: Yes     Allergies   Patient has no known allergies.   Review of Systems Review of Systems  Constitutional: Positive for fever (borderline).  HENT: Negative for sore throat.   Eyes: Negative for visual disturbance.  Respiratory: Positive for cough. Negative for shortness of breath.        Hemoptysis  Cardiovascular: Positive for leg swelling. Negative for chest pain.  Gastrointestinal: Negative for abdominal pain, constipation, diarrhea, nausea and vomiting.  Genitourinary: Negative for dysuria.  Musculoskeletal: Negative for back pain.  Skin: Positive for color change.  Neurological: Negative for headaches.  All other systems reviewed and are negative.    Physical Exam Updated Vital Signs BP 115/68    Pulse 95    Temp 98.3 F (36.8 C) (Oral)    Resp 18    SpO2 100%   Physical Exam Vitals signs and nursing note reviewed.  Constitutional:      General: He is not in acute distress.    Appearance: He is well-developed. He is not toxic-appearing.  HENT:     Head: Normocephalic and atraumatic.  Eyes:     Conjunctiva/sclera: Conjunctivae normal.  Neck:     Musculoskeletal: Neck supple.  Cardiovascular:     Rate and Rhythm: Regular rhythm. Tachycardia present.     Pulses: Normal pulses.     Heart sounds: Normal heart sounds. No murmur.  Pulmonary:     Effort: Pulmonary effort is normal. No respiratory distress.     Breath sounds: Normal breath sounds. No wheezing, rhonchi or rales.  Abdominal:     Palpations: Abdomen is soft.     Tenderness: There is no abdominal tenderness.    Musculoskeletal:        General: No tenderness.     Right lower leg: Edema present.     Left lower leg: No edema.     Comments: DP pulses 2+ bilat. Brisk cap refill to all toes. Distal extremities warm bilat.  Skin:    General: Skin is warm and dry.  Neurological:     Mental Status: He is alert.      ED Treatments / Results  Labs (all labs ordered are listed, but only abnormal results are displayed) Labs Reviewed  CBC WITH DIFFERENTIAL/PLATELET - Abnormal; Notable for the following components:      Result Value   Hemoglobin 12.6 (*)    Neutro Abs 8.6 (*)    All other  components within normal limits  BASIC METABOLIC PANEL - Abnormal; Notable for the following components:   Potassium 3.4 (*)    Glucose, Bld 132 (*)    Calcium 8.4 (*)    All other components within normal limits  CULTURE, BLOOD (ROUTINE X 2)  CULTURE, BLOOD (ROUTINE X 2)  SARS CORONAVIRUS 2 (HOSPITAL ORDER, [facility])    EKG None  Radiology Ct Angio Chest Pe W And/or Wo Contrast  Result Date: 06/20/2019 CLINICAL DATA:  Hemoptysis.  Lung carcinoma. EXAM: CT ANGIOGRAPHY CHEST WITH CONTRAST TECHNIQUE: Multidetector CT imaging of the chest was performed using the standard protocol during bolus administration of intravenous contrast. Multiplanar CT image reconstructions and MIPs were obtained to evaluate the vascular anatomy. CONTRAST:  173mL OMNIPAQUE IOHEXOL 350 MG/ML SOLN COMPARISON:  PET CT May 06, 2019; CT angiogram chest April 14, 2019 FINDINGS: Cardiovascular: There is no demonstrable pulmonary embolus. There is no thoracic aortic aneurysm or dissection. The visualized great vessels appear normal. There is no pericardial thickening. There is a minimal pericardial effusion with fluid in the pericardium likely within physiologic range. Mediastinum/Nodes: Thyroid appears normal. There is a left hilar lymph node measuring 1.7 x 1.4 cm, slightly larger than on prior CT angiogram chest  from 2 months prior there are multiple subcentimeter lymph nodes in the thoracic region which appear essentially stable. Subcentimeter lymph nodes in the left supraclavicular region also appear stable. No esophageal lesions are appreciable. Lungs/Pleura: There is extensive airspace consolidation throughout much of the right lower lobe consistent with pneumonia. There is consolidation in the periphery of the lateral segment right middle lobe. There are areas of patchy airspace disease in each upper lobe, more on the right than on the left. Previous masslike area in the posterior segment of the right upper lobe is less well delineated at this time, currently measuring 9 x 7 mm. This now ill-defined opacity is best seen on axial slice 62 series 6. There is bronchiectatic change in the right upper lobe, essentially stable. There is subtle patchy infiltrate at several areas in the left lower lobe. There is no appreciable pleural effusion. Upper Abdomen: There is a cyst in the left lobe of the liver anteriorly near the dome measuring 1.6 x 1.3 cm, stable. A 5 mm probable cyst is noted nearby. Visualized upper abdominal structures otherwise appear unremarkable. Musculoskeletal: There are no blastic or lytic bone lesions. No chest wall lesions evident. Review of the MIP images confirms the above findings. IMPRESSION: 1. No demonstrable pulmonary embolus. No thoracic aortic aneurysm or dissection. 2. Multifocal airspace opacity consistent with, most pronounced in the right lower lobe and a portion of the lateral segment right middle lobe. 3. In comparison with prior PET study, right upper lobe nodular lesion is smaller and less well-defined. 4. Slight increase in size of left hilar lymph node, known by PET to have increased metabolic activity. Neoplastic involvement of this lymph node is felt to be present. 5.  Stable bronchiectatic change in the right upper lobe. Electronically Signed   By: Lowella Grip III M.D.   On:  06/20/2019 13:03   Vas [place] Lower Extremity Venous (dvt) (only Mc & Wl 7a-7p)  Result Date: 06/20/2019  Lower Venous Study Indications: Swelling.  Risk Factors: Cancer. Anticoagulation: Xarelto. Comparison Study: 04/15/19 - Positive for DVT R PTV and SVT GSV Performing Technologist: Oliver Hum RVT  Examination Guidelines: A complete evaluation includes B-mode imaging, spectral Doppler, color Doppler, and power Doppler as needed  of all accessible portions of each vessel. Bilateral testing is considered an integral part of a complete examination. Limited examinations for reoccurring indications may be performed as noted.  +---------+---------------+---------+-----------+----------+-------+  RIGHT     Compressibility Phasicity Spontaneity Properties Summary  +---------+---------------+---------+-----------+----------+-------+  CFV       None            No        No                     Acute    +---------+---------------+---------+-----------+----------+-------+  SFJ       None                                             Acute    +---------+---------------+---------+-----------+----------+-------+  FV Prox   None            No        No                     Acute    +---------+---------------+---------+-----------+----------+-------+  FV Mid    None            No        No                     Acute    +---------+---------------+---------+-----------+----------+-------+  FV Distal None            No        No                     Acute    +---------+---------------+---------+-----------+----------+-------+  PFV       None            No        No                     Acute    +---------+---------------+---------+-----------+----------+-------+  POP       None            No        No                     Acute    +---------+---------------+---------+-----------+----------+-------+  PTV       Full                                                      +---------+---------------+---------+-----------+----------+-------+   PERO      Partial                                          Acute    +---------+---------------+---------+-----------+----------+-------+  Gastroc   Full                                                      +---------+---------------+---------+-----------+----------+-------+  GSV  None                                                      +---------+---------------+---------+-----------+----------+-------+  SSV       None                                                      +---------+---------------+---------+-----------+----------+-------+  EIV       Full            No        No                              +---------+---------------+---------+-----------+----------+-------+  CIV       Full            No        No                              +---------+---------------+---------+-----------+----------+-------+   +----+---------------+---------+-----------+----------+-------+  LEFT Compressibility Phasicity Spontaneity Properties Summary  +----+---------------+---------+-----------+----------+-------+  CFV  Full            Yes       Yes                             +----+---------------+---------+-----------+----------+-------+     Summary: Right: Findings consistent with acute deep vein thrombosis involving the right common femoral vein, right femoral vein, right proximal profunda vein, right popliteal vein, and right peroneal veins. Findings consistent with acute superficial vein thrombosis involving the right great saphenous vein, and right small saphenous vein. No cystic structure found in the popliteal fossa. There is no evidence of DVT involving the external iliac, and common illiac veins. Left: No evidence of common femoral vein obstruction. No cystic structure found in the popliteal fossa.  *See table(s) above for measurements and observations.    Preliminary     Procedures Procedures (including critical care time)  Medications Ordered in ED Medications  sodium chloride (PF) 0.9 % injection (has  no administration in time range)  cefTRIAXone (ROCEPHIN) 1 g in sodium chloride 0.9 % 100 mL IVPB (has no administration in time range)  azithromycin (ZITHROMAX) 500 mg in sodium chloride 0.9 % 250 mL IVPB (has no administration in time range)  sodium chloride 0.9 % bolus 500 mL (0 mLs Intravenous Stopped 06/20/19 1215)  iohexol (OMNIPAQUE) 350 MG/ML injection 100 mL (100 mLs Intravenous Contrast Given 06/20/19 1225)     Initial Impression / Assessment and Plan / ED Course  I have reviewed the triage vital signs and the nursing notes.  Pertinent labs & imaging results that were available during my care of the patient were reviewed by me and considered in my medical decision making (see chart for details).   Final Clinical Impressions(s) / ED Diagnoses   Final diagnoses:  Multifocal pneumonia  Acute deep vein thrombosis (DVT) of proximal vein of right lower extremity ([place])   68 y/o male with a h/o right lung CA presenting to the ED today for eval  of RLE swelling and pain. Has h/o DVT and started xarelto a few days ago. Also reporting intermittent cough and hemoptysis.   On eval, found to be tachycardic, afebrile, VS otherwise stable.   CBC without leukocytosis, Mild anemia, consistent with prior BMP with mild hypokalemia. Otherwise non-acute. COVID pending at the time of admission.   RLE [place] with findings consistent with acute deep vein thrombosis involving the right common femoral vein, right femoral vein, right proximal profunda vein, right popliteal vein, and right peroneal veins. Findings consistent with acute superficial vein thrombosis involving the right great saphenous vein, and right small saphenous vein. No cystic structure found in the popliteal fossa. There is no evidence of DVT involving the external iliac, and common illiac veins.   CT PE protocol with no demonstrable pulmonary embolus. No thoracic aortic aneurysm or dissection. Multifocal airspace opacity consistent with  pneumonia, most pronounced in the right lower lobe and a portion of the lateral segment right middle lobe. In comparison with prior PET study, right upper lobe nodular lesion is smaller and less well-defined. Slight increase in size of left hilar lymph node, known by PET to have increased metabolic activity. Neoplastic involvement of this lymph node is felt to be present. Stable bronchiectatic change in the right upper lobe.   1:28 PM CONSULT With Dr. Doristine Bosworth with hospitalist service who accepts pt for admission.   Pt seen in conjunction with Dr. Gilford Raid who personally evaluated the pt and is in agreement with the plan.   3:00 PM informed by nursing staff that patient has developed a rash and itching following administration of ceftriaxone.  Also has flushing to his face.  Assessed patient.  No angioedema present.  No wheezing.  No respiratory distress.  Denies shortness of breath.  Does have erythematous rash to chest and abdomen.  Benadryl and Pepcid given.  ED Discharge Orders    None       Bishop Dublin 06/20/19 1603    Isla Pence, MD 06/21/19 [phone]

## 2019-06-20 NOTE — ED Notes (Addendum)
Pt c/o feeling itchy at his hands and his face/neck burning. Pt reports this started when his bag of antibiotics was almost finished. Face/neck flushed. Pt denies difficulty breathing and SHOB. This nurse notified [place]

## 2019-06-20 NOTE — ED Notes (Signed)
Patient transported to CT 

## 2019-06-20 NOTE — ED Triage Notes (Signed)
Patient c/o right leg swelling since yesterday. Hx clots. Recently restarted taking xarelto. Just started chemo to treat lung cancer.

## 2019-06-20 NOTE — Progress Notes (Signed)
Right lower extremity venous duplex has been completed. Preliminary results can be found in CV Proc through chart review.  Results were given to [employer] PA.  06/20/19 12:18 PM Carlos Levering RVT

## 2019-06-20 NOTE — Progress Notes (Signed)
PHARMACY NOTE -  [place] has been assisting with dosing of Xarelto for DVT. Patient initially with DVT in April, started on Xarelto which he took through mid June, then stopped. Resumed last week d/t worsening DVT symptoms, but did not improve.    Based on failure to improve given resumption of Xarelto maintenance dosing AND initiation of chemo in active cancer patient, will restart DVT treatment with Xarelto at initial dose of 15 mg PO bid x 21 days, followed by 20 mg daily thereafter.  Pharmacy will sign off, monitoring Xarelto peripherally. Please reconsult if a change in clinical status warrants re-evaluation of dosage.  Reuel Boom, PharmD, BCPS [phone] 06/20/2019, 2:40 PM

## 2019-06-21 DIAGNOSIS — C3491 Malignant neoplasm of unspecified part of right bronchus or lung: Secondary | ICD-10-CM | POA: Diagnosis not present

## 2019-06-21 DIAGNOSIS — J189 Pneumonia, unspecified organism: Secondary | ICD-10-CM | POA: Diagnosis not present

## 2019-06-21 DIAGNOSIS — I824Y1 Acute embolism and thrombosis of unspecified deep veins of right proximal lower extremity: Secondary | ICD-10-CM | POA: Diagnosis not present

## 2019-06-21 DIAGNOSIS — I82401 Acute embolism and thrombosis of unspecified deep veins of right lower extremity: Secondary | ICD-10-CM | POA: Diagnosis not present

## 2019-06-21 DIAGNOSIS — J181 Lobar pneumonia, unspecified organism: Secondary | ICD-10-CM | POA: Diagnosis not present

## 2019-06-21 LAB — COMPREHENSIVE METABOLIC PANEL
ALT: 18 U/L (ref 0–44)
AST: 15 U/L (ref 15–41)
Albumin: 2.9 g/dL — ABNORMAL LOW (ref 3.5–5.0)
Alkaline Phosphatase: 95 U/L (ref 38–126)
Anion gap: 9 (ref 5–15)
BUN: 19 mg/dL (ref 8–23)
CO2: 23 mmol/L (ref 22–32)
Calcium: 8 mg/dL — ABNORMAL LOW (ref 8.9–10.3)
Chloride: 103 mmol/L (ref 98–111)
Creatinine, Ser: 0.77 mg/dL (ref 0.61–1.24)
GFR calc Af Amer: >60 mL/min (ref >60)
GFR calc non Af Amer: >60 mL/min (ref >60)
Glucose, Bld: 130 mg/dL — ABNORMAL HIGH (ref 70–99)
Potassium: 4.5 mmol/L (ref 3.5–5.1)
Sodium: 135 mmol/L (ref 135–145)
Total Bilirubin: 0.7 mg/dL (ref 0.3–1.2)
Total Protein: 5.7 g/dL — ABNORMAL LOW (ref 6.5–8.1)

## 2019-06-21 LAB — RESPIRATORY PANEL BY PCR

## 2019-06-21 LAB — CBC
HCT: 34.5 % — ABNORMAL LOW (ref 39.0–52.0)
Hemoglobin: 10.4 g/dL — ABNORMAL LOW (ref 13.0–17.0)
MCH: 26 pg (ref 26.0–34.0)
MCHC: 30.1 g/dL (ref 30.0–36.0)
MCV: 86.3 fL (ref 80.0–100.0)
Platelets: 138 10*3/uL — ABNORMAL LOW (ref 150–400)
RBC: 4 MIL/uL — ABNORMAL LOW (ref 4.22–5.81)
RDW: 14.1 % (ref 11.5–15.5)
WBC: 8.6 10*3/uL (ref 4.0–10.5)
nRBC: 0 % (ref 0.0–0.2)

## 2019-06-21 LAB — STREP PNEUMONIAE URINARY ANTIGEN: Strep Pneumo Urinary Antigen: NEGATIVE

## 2019-06-21 LAB — EXPECTORATED SPUTUM ASSESSMENT W GRAM STAIN, RFLX TO RESP C

## 2019-06-21 LAB — HIV ANTIBODY (ROUTINE TESTING W REFLEX): HIV Screen 4th Generation wRfx: NONREACTIVE

## 2019-06-21 MED ORDER — AZITHROMYCIN 500 MG PO TABS: 500.0000 mg | ORAL_TABLET | Freq: Every day | ORAL | 0 refills | Status: DC

## 2019-06-21 MED ORDER — METOPROLOL TARTRATE 25 MG PO TABS: 12.5000 mg | ORAL_TABLET | Freq: Two times a day (BID) | ORAL | Status: DC

## 2019-06-21 MED ORDER — CEFPODOXIME PROXETIL 200 MG PO TABS: 200.0000 mg | ORAL_TABLET | Freq: Two times a day (BID) | ORAL | 0 refills | Status: DC

## 2019-06-21 MED ORDER — GUAIFENESIN-DM 100-10 MG/5ML PO SYRP
5.0000 mL | ORAL_SOLUTION | ORAL | Status: DC | PRN
  Administered 2019-06-21: 5 mL via ORAL
  Filled 2019-06-21: qty 10

## 2019-06-21 NOTE — Care Management Obs Status (Signed)
[facility] NOTIFICATION   Patient Details  Name: Kyle Garcia MRN: [id number] Date of Birth: Aug 31, 1951   Medicare Observation Status Notification Given:  Yes    Joaquin Courts, RN 06/21/2019, 3:02 PM

## 2019-06-21 NOTE — Discharge Summary (Signed)
Discharge Summary  Webber Michiels IDP:[id number] DOB: 12-Feb-1951  PCP: Susy Frizzle, MD  Admit date: 06/20/2019 Discharge date: 06/21/2019  Time spent: 35 minutes  Recommendations for Outpatient Follow-up:  1. New medication: Zithromax 500 mg p.o. daily for 3 more days 2. New medication: Vantin 200 mg p.o. twice daily for 3 more days 3. Patient will follow-up with his PCP in the next 2 weeks  Discharge Diagnoses:  Active Hospital Problems   Diagnosis Date Noted   CAP (community acquired pneumonia) 06/20/2019   Adenocarcinoma of right lung, stage 4 ([place]) 06/04/2019   DVT (deep venous thrombosis) ([place]) 05/08/2019   Multifocal pneumonia 04/10/2019   GERD without esophagitis 11/14/2015    Resolved Hospital Problems  No resolved problems to display.    Discharge Condition: Improved, being discharged home  Diet recommendation: Regular diet  Vitals:   06/21/19 1100 06/21/19 1144  BP:  119/73  Pulse: 90 99  Resp:  18  Temp:  98.4 F (36.9 C)  SpO2:  96%    History of present illness:  68 year old male with past medical history of stage IV right lung adenocarcinoma and recent DVT who had been off of Xarelto after taking it for 7 weeks and started having increased leg pain recently.  He was then restarted on his Xarelto on Thursday, 6/25.  However, he continued to have worsening lower extremity pain despite this so he went to the emergency room on 6/27.  He also been noted to have a cough that have been ongoing since February which she had attributed to his cancer.  In the emergency room, patient was evaluated and a CT scan of the chest was done which noted no evidence of PE but did noted multi lobar pneumonia.  Patient was admitted to the hospitalist service  Hospital Course:  Active Problems:   GERD without esophagitis    DVT (deep venous thrombosis) ([place]): No signs of PE.  We had extensive discussion.  The likely cause of his DVT is his cancer.  He will need to be on  Xarelto likely lifelong.  I would not consider this a failure of Xarelto concerned there was a period of time where he was off of it.  He was having some increased pain likely from mobilization.  At also explained to him that when he is off his feet and then starts to walk, blood for increased blood flow and the activity may lead to increased pain in the beginning.  Over time, the clot will break down and that will improve his symptoms.  He was okay with this plan.    Adenocarcinoma of right lung, stage 4 ([place]): Stable.  No signs of postobstructive pneumonia.  Patient has a follow-up appointment with oncology this coming Wednesday 7/1.    CAP (community acquired pneumonia), multilobar: Stable.  Patient was not hypoxic on admission.  Normal white count.  He was however quite tachycardic likely from increased exertion even with minimal ambulation due to cancer and pneumonia.  Received IV antibiotics on 6/27 and then again on 6/28.  Will be discharged home with 3 more days of oral antibiotics to complete a 5-day course.  He will follow-up with his PCP in 2 weeks.  He was noted to be maintaining an oxygen saturation of 95% while ambulating around the hallway, although his heart rate which previously had come down to about 90 went up to about 120   Procedures:  None  Consultations:  None  Discharge Exam: BP 119/73 (BP Location:  Left Arm)    Pulse 99    Temp 98.4 F (36.9 C) (Oral)    Resp 18    Ht 5\' 10"  (1.778 m)    Wt 65.8 kg    SpO2 96%    BMI 20.81 kg/m   General: Alert and oriented x3, no acute distress Cardiovascular: Regular rate and rhythm at rest, sinus tachycardic after walk Respiratory: Some scattered rails  Discharge Instructions You were cared for by a hospitalist during your hospital stay. If you have any questions about your discharge medications or the care you received while you were in the hospital after you are discharged, you can call the unit and asked to speak with the  hospitalist on call if the hospitalist that took care of you is not available. Once you are discharged, your primary care physician will handle any further medical issues. Please note that NO REFILLS for any discharge medications will be authorized once you are discharged, as it is imperative that you return to your primary care physician (or establish a relationship with a primary care physician if you do not have one) for your aftercare needs so that they can reassess your need for medications and monitor your lab values.  Discharge Instructions    Diet - low sodium heart healthy   Complete by: As directed    Increase activity slowly   Complete by: As directed      Allergies as of 06/21/2019   No Known Allergies     Medication List    TAKE these medications   acetaminophen 500 MG tablet Commonly known as: TYLENOL Take 1,000 mg by mouth every 6 (six) hours as needed for headache.   azithromycin 500 MG tablet Commonly known as: Zithromax Take 1 tablet (500 mg total) by mouth daily. Start taking on: June 22, 2019   cefpodoxime 200 MG tablet Commonly known as: VANTIN Take 1 tablet (200 mg total) by mouth 2 (two) times daily. Start taking on: June 22, 6947   folic acid 1 MG tablet Commonly known as: FOLVITE Take 1 tablet (1 mg total) by mouth daily.   methylPREDNISolone 4 MG Tbpk tablet Commonly known as: MEDROL DOSEPAK Take per package instructions   PreserVision AREDS 2 Caps Take 1 capsule by mouth 2 (two) times daily.   multivitamin with minerals tablet Take 1 tablet by mouth daily.   omeprazole 40 MG capsule Commonly known as: PRILOSEC Take 1 capsule (40 mg total) by mouth daily.   pravastatin 20 MG tablet Commonly known as: PRAVACHOL TAKE 1 TABLET DAILY AT 6 PM What changed: See the new instructions.   prochlorperazine 10 MG tablet Commonly known as: COMPAZINE Take 1 tablet (10 mg total) by mouth every 6 (six) hours as needed for nausea or vomiting.   REFRESH  OP Place 1 drop into both eyes every evening.   Rivaroxaban 15 & 20 MG Tbpk Take as directed on package: Start with one 15mg  tablet by mouth twice a day with food. On Day 22, switch to one 20mg  tablet once a day with food.      No Known Allergies Follow-up Information    Susy Frizzle, MD Follow up in 2 week(s).   Specialty: Family Medicine Contact information: [address] [place] [address] [phone]            The results of significant diagnostics from this hospitalization (including imaging, microbiology, ancillary and laboratory) are listed below for reference.    Significant Diagnostic  Studies: Ct Angio Chest Pe W And/or Wo Contrast  Result Date: 06/20/2019 CLINICAL DATA:  Hemoptysis.  Lung carcinoma. EXAM: CT ANGIOGRAPHY CHEST WITH CONTRAST TECHNIQUE: Multidetector CT imaging of the chest was performed using the standard protocol during bolus administration of intravenous contrast. Multiplanar CT image reconstructions and MIPs were obtained to evaluate the vascular anatomy. CONTRAST:  161mL OMNIPAQUE IOHEXOL 350 MG/ML SOLN COMPARISON:  PET CT May 06, 2019; CT angiogram chest April 14, 2019 FINDINGS: Cardiovascular: There is no demonstrable pulmonary embolus. There is no thoracic aortic aneurysm or dissection. The visualized great vessels appear normal. There is no pericardial thickening. There is a minimal pericardial effusion with fluid in the pericardium likely within physiologic range. Mediastinum/Nodes: Thyroid appears normal. There is a left hilar lymph node measuring 1.7 x 1.4 cm, slightly larger than on prior CT angiogram chest from 2 months prior there are multiple subcentimeter lymph nodes in the thoracic region which appear essentially stable. Subcentimeter lymph nodes in the left supraclavicular region also appear stable. No esophageal lesions are appreciable. Lungs/Pleura: There is extensive airspace consolidation throughout much of the right lower  lobe consistent with pneumonia. There is consolidation in the periphery of the lateral segment right middle lobe. There are areas of patchy airspace disease in each upper lobe, more on the right than on the left. Previous masslike area in the posterior segment of the right upper lobe is less well delineated at this time, currently measuring 9 x 7 mm. This now ill-defined opacity is best seen on axial slice 62 series 6. There is bronchiectatic change in the right upper lobe, essentially stable. There is subtle patchy infiltrate at several areas in the left lower lobe. There is no appreciable pleural effusion. Upper Abdomen: There is a cyst in the left lobe of the liver anteriorly near the dome measuring 1.6 x 1.3 cm, stable. A 5 mm probable cyst is noted nearby. Visualized upper abdominal structures otherwise appear unremarkable. Musculoskeletal: There are no blastic or lytic bone lesions. No chest wall lesions evident. Review of the MIP images confirms the above findings. IMPRESSION: 1. No demonstrable pulmonary embolus. No thoracic aortic aneurysm or dissection. 2. Multifocal airspace opacity consistent with, most pronounced in the right lower lobe and a portion of the lateral segment right middle lobe. 3. In comparison with prior PET study, right upper lobe nodular lesion is smaller and less well-defined. 4. Slight increase in size of left hilar lymph node, known by PET to have increased metabolic activity. Neoplastic involvement of this lymph node is felt to be present. 5.  Stable bronchiectatic change in the right upper lobe. Electronically Signed   By: Lowella Grip III M.D.   On: 06/20/2019 13:03   Mr Jeri Cos VV Contrast  Result Date: 06/11/2019 CLINICAL DATA:  Staging of non-small cell lung cancer. EXAM: MRI HEAD WITHOUT AND WITH CONTRAST TECHNIQUE: Multiplanar, multiecho pulse sequences of the brain and surrounding structures were obtained without and with intravenous contrast. CONTRAST:  7 cc  Gadavist COMPARISON:  None. FINDINGS: Brain: This examination is indeterminate for small strokes and metastatic lesions. Diffusion imaging shows 3 or 4 punctate foci minimal restricted diffusion in the left cerebellum. Within the cerebral hemispheres, there are 25-30 punctate foci of restricted diffusion scattered within the cortical and subcortical brain, most numerous in the right parietal region. These are associated with T2 and FLAIR signal. Some are of slightly differing ages. From the standpoint of contrast enhancement. A few of these show contrast enhancement, no more than  7 or 8 of the punctate lesions in the cerebral hemispheres. 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 There is no large lesion. No lesion is associated with vasogenic edema. No hydrocephalus, hemorrhage or extra-axial collection. Incidental note is made of a small lipoma at the midline hypothalamus. Vascular: Major vessels at the base of the brain show flow. Skull and upper cervical spine: Negative. Benign appearing heterogeneous appearance the clivus. Sinuses/Orbits: Clear/normal Other: None IMPRESSION: 25-30 punctate foci of restricted diffusion scattered within the left cerebellum and both cerebral hemispheres, with slightly differing levels of restricted diffusion. 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 7 or 8 of the small foci show contrast enhancement, but the majority do not. I cannot, at this point, completely exclude the possibility metastatic disease at some of these foci. Therefore, imaging follow-up will be necessary. This could be done in 1 month at which time we should be able to differentiate micro embolic infarctions from metastases. In the meantime, it would make sense to evaluate the heart and ascending aorta for potential embolic sources.  Electronically Signed   By: Nelson Chimes M.D.   On: 06/11/2019 14:28   Vas [place] Lower Extremity Venous (dvt) (only Mc & Wl 7a-7p)  Result Date: 06/21/2019  Lower Venous Study Indications: Swelling.  Risk Factors: Cancer. Anticoagulation: Xarelto. Comparison Study: 04/15/19 - Positive for DVT R PTV and SVT GSV Performing Technologist: Oliver Hum RVT  Examination Guidelines: A complete evaluation includes B-mode imaging, spectral Doppler, color Doppler, and power Doppler as needed of all accessible portions of each vessel. Bilateral testing is considered an integral part of a complete examination. Limited examinations for reoccurring indications may be performed as noted.  +---------+---------------+---------+-----------+----------+-------+  RIGHT     Compressibility Phasicity Spontaneity Properties Summary  +---------+---------------+---------+-----------+----------+-------+  CFV       None            No        No                     Acute    +---------+---------------+---------+-----------+----------+-------+  SFJ       None                                             Acute    +---------+---------------+---------+-----------+----------+-------+  FV Prox   None            No        No                     Acute    +---------+---------------+---------+-----------+----------+-------+  FV Mid    None            No        No                     Acute    +---------+---------------+---------+-----------+----------+-------+  FV Distal None            No        No                     Acute    +---------+---------------+---------+-----------+----------+-------+  PFV  None            No        No                     Acute    +---------+---------------+---------+-----------+----------+-------+  POP       None            No        No                     Acute    +---------+---------------+---------+-----------+----------+-------+  PTV       Full                                                       +---------+---------------+---------+-----------+----------+-------+  PERO      Partial                                          Acute    +---------+---------------+---------+-----------+----------+-------+  Gastroc   Full                                                      +---------+---------------+---------+-----------+----------+-------+  GSV       None                                                      +---------+---------------+---------+-----------+----------+-------+  SSV       None                                                      +---------+---------------+---------+-----------+----------+-------+  EIV       Full            No        No                              +---------+---------------+---------+-----------+----------+-------+  CIV       Full            No        No                              +---------+---------------+---------+-----------+----------+-------+   +----+---------------+---------+-----------+----------+-------+  LEFT Compressibility Phasicity Spontaneity Properties Summary  +----+---------------+---------+-----------+----------+-------+  CFV  Full            Yes       Yes                             +----+---------------+---------+-----------+----------+-------+     Summary: Right: Findings consistent with acute deep vein thrombosis involving the  right common femoral vein, right femoral vein, right proximal profunda vein, right popliteal vein, and right peroneal veins. Findings consistent with acute superficial vein thrombosis involving the right great saphenous vein, and right small saphenous vein. No cystic structure found in the popliteal fossa. There is no evidence of DVT involving the external iliac, and common illiac veins. Left: No evidence of common femoral vein obstruction. No cystic structure found in the popliteal fossa.  *See table(s) above for measurements and observations. Electronically signed by Curt Jews MD on 06/21/2019 at 9:06:27 AM.    Final      Microbiology: Recent Results (from the past 240 hour(s))  Blood culture (routine x 2)     Status: None (Preliminary result)   Collection Time: 06/20/19  1:12 PM   Specimen: BLOOD RIGHT HAND  Result Value Ref Range Status   Specimen Description   Final    BLOOD RIGHT HAND Performed at [facility], [place] [address] [place], [place] [address]    Special Requests   Final    BOTTLES DRAWN AEROBIC AND ANAEROBIC Blood Culture results may not be optimal due to an excessive volume of blood received in culture bottles Performed at [facility] [address], [place], [place] [address]    Culture   Final    NO GROWTH < 24 HOURS Performed at [facility] [address] [place],  [address]    Report Status PENDING  Incomplete  SARS Coronavirus 2 (CEPHEID- Performed in [facility] hospital lab), Hosp Order     Status: None   Collection Time: 06/20/19  1:14 PM   Specimen: Nasopharyngeal Swab  Result Value Ref Range Status   SARS Coronavirus 2 NEGATIVE NEGATIVE Final    Comment: (NOTE) If result is NEGATIVE SARS-CoV-2 target nucleic acids are NOT DETECTED. The SARS-CoV-2 RNA is generally detectable in upper and lower  respiratory specimens during the acute phase of infection. The lowest  concentration of SARS-CoV-2 viral copies this assay can detect is 250  copies / mL. A negative result does not preclude SARS-CoV-2 infection  and should not be used as the sole basis for treatment or other  patient management decisions.  A negative result may occur with  improper specimen collection / handling, submission of specimen other  than nasopharyngeal swab, presence of viral mutation(s) within the  areas targeted by this assay, and inadequate number of viral copies  (<250 copies / mL). A negative result must be combined with clinical  observations, patient history, and epidemiological information. If result is POSITIVE SARS-CoV-2 target  nucleic acids are DETECTED. The SARS-CoV-2 RNA is generally detectable in upper and lower  respiratory specimens dur ing the acute phase of infection.  Positive  results are indicative of active infection with SARS-CoV-2.  Clinical  correlation with patient history and other diagnostic information is  necessary to determine patient infection status.  Positive results do  not rule out bacterial infection or co-infection with other viruses. If result is PRESUMPTIVE POSTIVE SARS-CoV-2 nucleic acids MAY BE PRESENT.   A presumptive positive result was obtained on the submitted specimen  and confirmed on repeat testing.  While 2019 novel coronavirus  (SARS-CoV-2) nucleic acids may be present in the submitted sample  additional confirmatory testing may be necessary for epidemiological  and / or clinical management purposes  to differentiate between  SARS-CoV-2 and other Sarbecovirus currently known to infect humans.  If clinically indicated additional testing with an alternate test  methodology (  [id number]) is advised. The SARS-CoV-2 RNA is generally  detectable in upper and lower respiratory sp ecimens during the acute  phase of infection. The expected result is Negative. Fact Sheet for Patients:  StrictlyIdeas.no Fact Sheet for Healthcare Providers: BankingDealers.co.za This test is not yet approved or cleared by the [place] FDA and has been authorized for detection and/or diagnosis of SARS-CoV-2 by FDA under an Emergency Use Authorization (EUA).  This EUA will remain in effect (meaning this test can be used) for the duration of the COVID-19 declaration under Section 564(b)(1) of the Act, 21 U.S.C. section 360bbb-3(b)(1), unless the authorization is terminated or revoked sooner. Performed at [facility], [place] [address], [place], [place] [address]   Blood culture (routine x 2)     Status: None (Preliminary result)    Collection Time: 06/20/19  2:02 PM   Specimen: BLOOD  Result Value Ref Range Status   Specimen Description   Final    BLOOD RIGHT ANTECUBITAL Performed at [facility] [address], [place], [place] [address]    Special Requests   Final    BOTTLES DRAWN AEROBIC AND ANAEROBIC Blood Culture results may not be optimal due to an excessive volume of blood received in culture bottles Performed at [facility] [address] [place], [place] [address]    Culture   Final    NO GROWTH < 24 HOURS Performed at [facility] [address] [place], [place] [address]    Report Status PENDING  Incomplete  Respiratory Panel by PCR     Status: None   Collection Time: 06/21/19 12:25 AM   Specimen: Nasopharyngeal Swab; Respiratory  Result Value Ref Range Status   Adenovirus NOT DETECTED NOT DETECTED Final   Coronavirus 229E NOT DETECTED NOT DETECTED Final    Comment: (NOTE) The Coronavirus on the Respiratory Panel, DOES NOT test for the novel  Coronavirus (2019 nCoV)    Coronavirus HKU1 NOT DETECTED NOT DETECTED Final   Coronavirus NL63 NOT DETECTED NOT DETECTED Final   Coronavirus OC43 NOT DETECTED NOT DETECTED Final   Metapneumovirus NOT DETECTED NOT DETECTED Final   Rhinovirus / Enterovirus NOT DETECTED NOT DETECTED Final   Influenza A NOT DETECTED NOT DETECTED Final   Influenza B NOT DETECTED NOT DETECTED Final   Parainfluenza Virus 1 NOT DETECTED NOT DETECTED Final   Parainfluenza Virus 2 NOT DETECTED NOT DETECTED Final   Parainfluenza Virus 3 NOT DETECTED NOT DETECTED Final   Parainfluenza Virus 4 NOT DETECTED NOT DETECTED Final   Respiratory Syncytial Virus NOT DETECTED NOT DETECTED Final   Bordetella pertussis NOT DETECTED NOT DETECTED Final   Chlamydophila pneumoniae NOT DETECTED NOT DETECTED Final   Mycoplasma pneumoniae NOT DETECTED NOT DETECTED Final    Comment: Performed at [facility], [place]. [address] [place], [place]  [address]  Sputum culture     Status: None   Collection Time: 06/21/19  6:08 AM   Specimen: Sputum  Result Value Ref Range Status   Specimen Description SPUTUM  Final   Special Requests NONE  Final   Sputum evaluation   Final    THIS SPECIMEN IS ACCEPTABLE FOR SPUTUM CULTURE Performed at [facility], [place] [address], [place], [place] [address]    Report Status 06/21/2019 FINAL  Final     Labs: Basic Metabolic Panel: Recent Labs  Lab 06/17/19 1137 06/20/19 1131 06/21/19 0427  NA 140 135 135  K 4.0 3.4*  4.5  CL 103 100 103  CO2 26 23 23   GLUCOSE 99 132* 130*  BUN 15 23 19   CREATININE 0.90 0.91 0.77  CALCIUM 8.6* 8.4* 8.0*   Liver Function Tests: Recent Labs  Lab 06/17/19 1137 06/21/19 0427  AST 16 15  ALT 33 18  ALKPHOS 139* 95  BILITOT 0.4 0.7  PROT 7.0 5.7*  ALBUMIN 3.5 2.9*   No results for input(s): LIPASE, AMYLASE in the last 168 hours. No results for input(s): AMMONIA in the last 168 hours. CBC: Recent Labs  Lab 06/17/19 1137 06/20/19 1131 06/21/19 0427  WBC 12.7* 9.9 8.6  NEUTROABS 11.1* 8.6*  --   HGB 13.2 12.6* 10.4*  HCT 42.4 41.7 34.5*  MCV 85.5 86.7 86.3  PLT 183 179 138*   Cardiac Enzymes: No results for input(s): CKTOTAL, CKMB, CKMBINDEX, TROPONINI in the last 168 hours. BNP: BNP (last 3 results) No results for input(s): BNP in the last 8760 hours.  ProBNP (last 3 results) No results for input(s): PROBNP in the last 8760 hours.  CBG: No results for input(s): GLUCAP in the last 168 hours.     Signed:  Annita Brod, MD [facility] 06/21/2019, 3:48 PM

## 2019-06-21 NOTE — Plan of Care (Signed)

## 2019-06-21 NOTE — Progress Notes (Signed)
Received patient to unit via stretcher, A&Ox3, able to move to the bed with minimal assist, skin assessment done with ER nurse, intact, lungs diminished but clear, harsh dry cough noted, non-productive at this time, RLE red, warm to touch, edematous, +pp, +csm, pain 4/10, text page sent to on-call for cough and pain meds, admission completed, IV fluids hooked up, resp panel sent, isolation precautions initiated, ST on the monitor, vss, instructed on call bell use, will continue to monitor. Patient did call his wife to provide an update of his admission and room number.

## 2019-06-21 NOTE — Progress Notes (Signed)
Patient came had a low grade fever and was tachycardic while he was downstairs in the emergency department, MD aware

## 2019-06-22 LAB — LEGIONELLA PNEUMOPHILA SEROGP 1 UR AG: L. pneumophila Serogp 1 Ur Ag: NEGATIVE

## 2019-06-23 LAB — CULTURE, RESPIRATORY W GRAM STAIN: Culture: NORMAL

## 2019-06-24 ENCOUNTER — Inpatient Hospital Stay: Payer: Medicare Other | Attending: Internal Medicine | Admitting: Internal Medicine

## 2019-06-24 ENCOUNTER — Inpatient Hospital Stay: Payer: Medicare Other

## 2019-06-24 ENCOUNTER — Other Ambulatory Visit: Payer: Self-pay

## 2019-06-24 ENCOUNTER — Encounter: Payer: Self-pay | Admitting: Internal Medicine

## 2019-06-24 VITALS — BP 122/75 | HR 118 | Temp 99.1°F | Resp 18 | Ht 70.0 in | Wt 147.8 lb

## 2019-06-24 DIAGNOSIS — I6340 Cerebral infarction due to embolism of unspecified cerebral artery: Secondary | ICD-10-CM

## 2019-06-24 DIAGNOSIS — C3491 Malignant neoplasm of unspecified part of right bronchus or lung: Secondary | ICD-10-CM

## 2019-06-24 DIAGNOSIS — C3431 Malignant neoplasm of lower lobe, right bronchus or lung: Secondary | ICD-10-CM

## 2019-06-24 DIAGNOSIS — R590 Localized enlarged lymph nodes: Secondary | ICD-10-CM | POA: Diagnosis not present

## 2019-06-24 DIAGNOSIS — R5383 Other fatigue: Secondary | ICD-10-CM

## 2019-06-24 DIAGNOSIS — Z79899 Other long term (current) drug therapy: Secondary | ICD-10-CM | POA: Diagnosis not present

## 2019-06-24 DIAGNOSIS — K219 Gastro-esophageal reflux disease without esophagitis: Secondary | ICD-10-CM | POA: Diagnosis not present

## 2019-06-24 DIAGNOSIS — I313 Pericardial effusion (noninflammatory): Secondary | ICD-10-CM | POA: Insufficient documentation

## 2019-06-24 DIAGNOSIS — Z5112 Encounter for antineoplastic immunotherapy: Secondary | ICD-10-CM

## 2019-06-24 DIAGNOSIS — Z5111 Encounter for antineoplastic chemotherapy: Secondary | ICD-10-CM

## 2019-06-24 DIAGNOSIS — E785 Hyperlipidemia, unspecified: Secondary | ICD-10-CM | POA: Diagnosis not present

## 2019-06-24 DIAGNOSIS — K7689 Other specified diseases of liver: Secondary | ICD-10-CM | POA: Insufficient documentation

## 2019-06-24 DIAGNOSIS — Z8673 Personal history of transient ischemic attack (TIA), and cerebral infarction without residual deficits: Secondary | ICD-10-CM | POA: Diagnosis not present

## 2019-06-24 LAB — CMP (CANCER CENTER ONLY)
ALT: 24 U/L (ref 0–44)
AST: 19 U/L (ref 15–41)
Albumin: 3 g/dL — ABNORMAL LOW (ref 3.5–5.0)
Alkaline Phosphatase: 120 U/L (ref 38–126)
Anion gap: 11 (ref 5–15)
BUN: 17 mg/dL (ref 8–23)
CO2: 26 mmol/L (ref 22–32)
Calcium: 8.2 mg/dL — ABNORMAL LOW (ref 8.9–10.3)
Chloride: 100 mmol/L (ref 98–111)
Creatinine: 0.82 mg/dL (ref 0.61–1.24)
GFR, Est AFR Am: >60 mL/min (ref >60)
GFR, Estimated: >60 mL/min (ref >60)
Glucose, Bld: 95 mg/dL (ref 70–99)
Potassium: 3.4 mmol/L — ABNORMAL LOW (ref 3.5–5.1)
Sodium: 137 mmol/L (ref 135–145)
Total Bilirubin: 0.4 mg/dL (ref 0.3–1.2)
Total Protein: 6.4 g/dL — ABNORMAL LOW (ref 6.5–8.1)

## 2019-06-24 LAB — CBC WITH DIFFERENTIAL (CANCER CENTER ONLY)
Abs Immature Granulocytes: 0.05 10*3/uL (ref 0.00–0.07)
Basophils Absolute: 0 10*3/uL (ref 0.0–0.1)
Basophils Relative: 0 %
Eosinophils Absolute: 0.1 10*3/uL (ref 0.0–0.5)
Eosinophils Relative: 2 %
HCT: 34.3 % — ABNORMAL LOW (ref 39.0–52.0)
Hemoglobin: 10.7 g/dL — ABNORMAL LOW (ref 13.0–17.0)
Immature Granulocytes: 1 %
Lymphocytes Relative: 11 %
Lymphs Abs: 0.6 10*3/uL — ABNORMAL LOW (ref 0.7–4.0)
MCH: 26.2 pg (ref 26.0–34.0)
MCHC: 31.2 g/dL (ref 30.0–36.0)
MCV: 83.9 fL (ref 80.0–100.0)
Monocytes Absolute: 0.6 10*3/uL (ref 0.1–1.0)
Monocytes Relative: 10 %
Neutro Abs: 4.5 10*3/uL (ref 1.7–7.7)
Neutrophils Relative %: 76 %
Platelet Count: 164 10*3/uL (ref 150–400)
RBC: 4.09 MIL/uL — ABNORMAL LOW (ref 4.22–5.81)
RDW: 13.7 % (ref 11.5–15.5)
WBC Count: 5.9 10*3/uL (ref 4.0–10.5)
nRBC: 0 % (ref 0.0–0.2)

## 2019-06-24 LAB — TSH: TSH: 0.946 u[IU]/mL (ref 0.320–4.118)

## 2019-06-24 LAB — FUNGUS CULTURE RESULT

## 2019-06-24 LAB — FUNGUS CULTURE WITH STAIN

## 2019-06-24 LAB — FUNGAL ORGANISM REFLEX

## 2019-06-24 MED ORDER — HYDROCODONE-HOMATROPINE 5-1.5 MG/5ML PO SYRP: 5.0000 mL | ORAL_SOLUTION | Freq: Four times a day (QID) | ORAL | 0 refills | Status: DC | PRN

## 2019-06-24 NOTE — Progress Notes (Signed)
[place] [phone]   Fax:([phone]) [phone]  OFFICE PROGRESS NOTE  Susy Frizzle, MD [address] [place] [address]  DIAGNOSIS:  Stage IV (T3, N3,M1a)non-small cell lung cancer, adenocarcinoma presented with right lower lobe consolidative and infiltrative process with suspicious lymphangitic spread as well as bilateral hilar and mediastinal lymphadenopathy and left supraclavicular lymphadenopathy diagnosed in May 2020.   PDL1 Expression: 50%  Biomarker Findings Microsatellite status - Cannot Be Determined Tumor Mutational Burden - 6 Muts/Mb Genomic Findings For a complete list of the genes assayed, please refer to the Appendix. ARID1A B9390* BRAF G469L 7 Disease relevant genes with no reportable alterations: ALK, EGFR, ERBB2, KRAS, MET, RET, ROS1   PRIOR THERAPY: None  CURRENT THERAPY: Systemic chemotherapy with Carboplatin for AUC of 5, Alimta 500 mg/M2 and Keytruda 200 mg IV every 3. First dose 06/17/2019.   INTERVAL HISTORY: Kyle Garcia 68 y.o. male returns to the clinic today for follow-up visit.  The patient is feeling fine today with no concerning complaints except for the dry cough.  He was recently admitted to [facility] with deep venous thrombosis of the right lower extremities and he is currently on treatment with Xarelto.  CT scan of the chest at that time showed no evidence for pulmonary embolism.  He was also giving a diagnosis of pneumonia and currently undergoing treatment with antibiotics.  The patient tolerated the first week of his chemotherapy fairly well with no concerning adverse effects.  He denied having any nausea, vomiting, diarrhea or constipation.  He denied having any fever or chills.  He has no headache or visual changes.  He is here today for evaluation and repeat blood work.  MEDICAL HISTORY: Past Medical History:  Diagnosis Date   Cancer [facility])    stage 4 adenocarcinoma right lung     GERD (gastroesophageal reflux disease)    Hyperlipidemia    Stroke ([place])    25-30 emboli seen on screening MRI    ALLERGIES:  has No Known Allergies.  MEDICATIONS:  Current Outpatient Medications  Medication Sig Dispense Refill   acetaminophen (TYLENOL) 500 MG tablet Take 1,000 mg by mouth every 6 (six) hours as needed for headache.     azithromycin (ZITHROMAX) 500 MG tablet Take 1 tablet (500 mg total) by mouth daily. 3 tablet 0   cefpodoxime (VANTIN) 200 MG tablet Take 1 tablet (200 mg total) by mouth 2 (two) times daily. 6 tablet 0   folic acid (FOLVITE) 1 MG tablet Take 1 tablet (1 mg total) by mouth daily. 30 tablet 4   Multiple Vitamins-Minerals (MULTIVITAMIN WITH MINERALS) tablet Take 1 tablet by mouth daily.      Multiple Vitamins-Minerals (PRESERVISION AREDS 2) CAPS Take 1 capsule by mouth 2 (two) times daily.     omeprazole (PRILOSEC) 40 MG capsule Take 1 capsule (40 mg total) by mouth daily. 90 capsule 3   Polyvinyl Alcohol-Povidone (REFRESH OP) Place 1 drop into both eyes every evening.     pravastatin (PRAVACHOL) 20 MG tablet TAKE 1 TABLET DAILY AT 6 PM (Patient taking differently: Take 20 mg by mouth daily at 6 PM. ) 90 tablet 1   prochlorperazine (COMPAZINE) 10 MG tablet Take 1 tablet (10 mg total) by mouth every 6 (six) hours as needed for nausea or vomiting. 30 tablet 0   Rivaroxaban 15 & 20 MG TBPK Take as directed on package: Start with one 1m tablet by mouth twice a day  with food. On Day 22, switch to one 79m tablet once a day with food. 51 each 0   No current facility-administered medications for this visit.     SURGICAL HISTORY:  Past Surgical History:  Procedure Laterality Date   CATARACT EXTRACTION  2016   EYE SURGERY     VIDEO BRONCHOSCOPY Bilateral 05/21/2019   Procedure: VIDEO BRONCHOSCOPY WITH FLUORO;  Surgeon: BCollene Gobble MD;  Location: [facility]  Service: Cardiopulmonary;  Laterality: Bilateral;    REVIEW OF SYSTEMS:   Constitutional: negative Eyes: negative Ears, nose, mouth, throat, and face: negative Respiratory: positive for cough and dyspnea on exertion Cardiovascular: negative Gastrointestinal: negative Genitourinary:negative Integument/breast: negative Hematologic/lymphatic: negative Musculoskeletal:negative Neurological: negative Behavioral/Psych: negative Endocrine: negative Allergic/Immunologic: negative   PHYSICAL EXAMINATION: General appearance: alert, cooperative and no distress Head: Normocephalic, without obvious abnormality, atraumatic Neck: no adenopathy, no JVD, supple, symmetrical, trachea midline and thyroid not enlarged, symmetric, no tenderness/mass/nodules Lymph nodes: Cervical, supraclavicular, and axillary nodes normal. Resp: clear to auscultation bilaterally Back: symmetric, no curvature. ROM normal. No CVA tenderness. Cardio: regular rate and rhythm, S1, S2 normal, no murmur, click, rub or gallop GI: soft, non-tender; bowel sounds normal; no masses,  no organomegaly Extremities: extremities normal, atraumatic, no cyanosis or edema Neurologic: Alert and oriented X 3, normal strength and tone. Normal symmetric reflexes. Normal coordination and gait  ECOG PERFORMANCE STATUS: 1 - Symptomatic but completely ambulatory  Blood pressure 122/75, pulse (!) 118, temperature 99.1 F (37.3 C), temperature source Oral, resp. rate 18, height 5' 10"  (1.778 m), weight 147 lb 12.8 oz (67 kg), SpO2 100 %.  LABORATORY DATA: Lab Results  Component Value Date   WBC 5.9 06/24/2019   HGB 10.7 (L) 06/24/2019   HCT 34.3 (L) 06/24/2019   MCV 83.9 06/24/2019   PLT 164 06/24/2019      Chemistry      Component Value Date/Time   NA 135 06/21/2019 0427   K 4.5 06/21/2019 0427   CL 103 06/21/2019 0427   CO2 23 06/21/2019 0427   BUN 19 06/21/2019 0427   CREATININE 0.77 06/21/2019 0427   CREATININE 0.90 06/17/2019 1137   CREATININE 0.85 12/02/2018 0906      Component Value Date/Time    CALCIUM 8.0 (L) 06/21/2019 0427   ALKPHOS 95 06/21/2019 0427   AST 15 06/21/2019 0427   AST 16 06/17/2019 1137   ALT 18 06/21/2019 0427   ALT 33 06/17/2019 1137   BILITOT 0.7 06/21/2019 0427   BILITOT 0.4 06/17/2019 1137       RADIOGRAPHIC STUDIES: Ct Angio Chest Pe W And/or Wo Contrast  Result Date: 06/20/2019 CLINICAL DATA:  Hemoptysis.  Lung carcinoma. EXAM: CT ANGIOGRAPHY CHEST WITH CONTRAST TECHNIQUE: Multidetector CT imaging of the chest was performed using the standard protocol during bolus administration of intravenous contrast. Multiplanar CT image reconstructions and MIPs were obtained to evaluate the vascular anatomy. CONTRAST:  1076mOMNIPAQUE IOHEXOL 350 MG/ML SOLN COMPARISON:  PET CT May 06, 2019; CT angiogram chest April 14, 2019 FINDINGS: Cardiovascular: There is no demonstrable pulmonary embolus. There is no thoracic aortic aneurysm or dissection. The visualized great vessels appear normal. There is no pericardial thickening. There is a minimal pericardial effusion with fluid in the pericardium likely within physiologic range. Mediastinum/Nodes: Thyroid appears normal. There is a left hilar lymph node measuring 1.7 x 1.4 cm, slightly larger than on prior CT angiogram chest from 2 months prior there are multiple subcentimeter lymph nodes in the thoracic region which appear essentially  stable. Subcentimeter lymph nodes in the left supraclavicular region also appear stable. No esophageal lesions are appreciable. Lungs/Pleura: There is extensive airspace consolidation throughout much of the right lower lobe consistent with pneumonia. There is consolidation in the periphery of the lateral segment right middle lobe. There are areas of patchy airspace disease in each upper lobe, more on the right than on the left. Previous masslike area in the posterior segment of the right upper lobe is less well delineated at this time, currently measuring 9 x 7 mm. This now ill-defined opacity is best  seen on axial slice 62 series 6. There is bronchiectatic change in the right upper lobe, essentially stable. There is subtle patchy infiltrate at several areas in the left lower lobe. There is no appreciable pleural effusion. Upper Abdomen: There is a cyst in the left lobe of the liver anteriorly near the dome measuring 1.6 x 1.3 cm, stable. A 5 mm probable cyst is noted nearby. Visualized upper abdominal structures otherwise appear unremarkable. Musculoskeletal: There are no blastic or lytic bone lesions. No chest wall lesions evident. Review of the MIP images confirms the above findings. IMPRESSION: 1. No demonstrable pulmonary embolus. No thoracic aortic aneurysm or dissection. 2. Multifocal airspace opacity consistent with, most pronounced in the right lower lobe and a portion of the lateral segment right middle lobe. 3. In comparison with prior PET study, right upper lobe nodular lesion is smaller and less well-defined. 4. Slight increase in size of left hilar lymph node, known by PET to have increased metabolic activity. Neoplastic involvement of this lymph node is felt to be present. 5.  Stable bronchiectatic change in the right upper lobe. Electronically Signed   By: Lowella Grip III M.D.   On: 06/20/2019 13:03   Mr Jeri Cos BB Contrast  Result Date: 06/11/2019 CLINICAL DATA:  Staging of non-small cell lung cancer. EXAM: MRI HEAD WITHOUT AND WITH CONTRAST TECHNIQUE: Multiplanar, multiecho pulse sequences of the brain and surrounding structures were obtained without and with intravenous contrast. CONTRAST:  7 cc Gadavist COMPARISON:  None. FINDINGS: Brain: This examination is indeterminate for small strokes and metastatic lesions. Diffusion imaging shows 3 or 4 punctate foci minimal restricted diffusion in the left cerebellum. Within the cerebral hemispheres, there are 25-30 punctate foci of restricted diffusion scattered within the cortical and subcortical brain, most numerous in the right parietal  region. These are associated with T2 and FLAIR signal. Some are of slightly differing ages. From the standpoint of contrast enhancement. A few of these show contrast enhancement, no more than 7 or 8 of the punctate lesions in the cerebral hemispheres. 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 There is no large lesion. No lesion is associated with vasogenic edema. No hydrocephalus, hemorrhage or extra-axial collection. Incidental note is made of a small lipoma at the midline hypothalamus. Vascular: Major vessels at the base of the brain show flow. Skull and upper cervical spine: Negative. Benign appearing heterogeneous appearance the clivus. Sinuses/Orbits: Clear/normal Other: None IMPRESSION: 25-30 punctate foci of restricted diffusion scattered within the left cerebellum and both cerebral hemispheres, with slightly differing levels of restricted diffusion. 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 7 or 8 of the small foci show contrast enhancement, but the majority do not. I cannot, at this point, completely exclude the possibility metastatic disease at some of these  foci. Therefore, imaging follow-up will be necessary. This could be done in 1 month at which time we should be able to differentiate micro embolic infarctions from metastases. In the meantime, it would make sense to evaluate the heart and ascending aorta for potential embolic sources. Electronically Signed   By: Nelson Chimes M.D.   On: 06/11/2019 14:28   Vas [place] Lower Extremity Venous (dvt) (only Mc & Wl 7a-7p)  Result Date: 06/21/2019  Lower Venous Study Indications: Swelling.  Risk Factors: Cancer. Anticoagulation: Xarelto. Comparison Study: 04/15/19 - Positive for DVT R PTV and SVT GSV Performing Technologist: Oliver Hum RVT  Examination Guidelines: A complete evaluation  includes B-mode imaging, spectral Doppler, color Doppler, and power Doppler as needed of all accessible portions of each vessel. Bilateral testing is considered an integral part of a complete examination. Limited examinations for reoccurring indications may be performed as noted.  +---------+---------------+---------+-----------+----------+-------+  RIGHT     Compressibility Phasicity Spontaneity Properties Summary  +---------+---------------+---------+-----------+----------+-------+  CFV       None            No        No                     Acute    +---------+---------------+---------+-----------+----------+-------+  SFJ       None                                             Acute    +---------+---------------+---------+-----------+----------+-------+  FV Prox   None            No        No                     Acute    +---------+---------------+---------+-----------+----------+-------+  FV Mid    None            No        No                     Acute    +---------+---------------+---------+-----------+----------+-------+  FV Distal None            No        No                     Acute    +---------+---------------+---------+-----------+----------+-------+  PFV       None            No        No                     Acute    +---------+---------------+---------+-----------+----------+-------+  POP       None            No        No                     Acute    +---------+---------------+---------+-----------+----------+-------+  PTV       Full                                                      +---------+---------------+---------+-----------+----------+-------+  PERO      Partial                                          Acute    +---------+---------------+---------+-----------+----------+-------+  Gastroc   Full                                                      +---------+---------------+---------+-----------+----------+-------+  GSV       None                                                       +---------+---------------+---------+-----------+----------+-------+  SSV       None                                                      +---------+---------------+---------+-----------+----------+-------+  EIV       Full            No        No                              +---------+---------------+---------+-----------+----------+-------+  CIV       Full            No        No                              +---------+---------------+---------+-----------+----------+-------+   +----+---------------+---------+-----------+----------+-------+  LEFT Compressibility Phasicity Spontaneity Properties Summary  +----+---------------+---------+-----------+----------+-------+  CFV  Full            Yes       Yes                             +----+---------------+---------+-----------+----------+-------+     Summary: Right: Findings consistent with acute deep vein thrombosis involving the right common femoral vein, right femoral vein, right proximal profunda vein, right popliteal vein, and right peroneal veins. Findings consistent with acute superficial vein thrombosis involving the right great saphenous vein, and right small saphenous vein. No cystic structure found in the popliteal fossa. There is no evidence of DVT involving the external iliac, and common illiac veins. Left: No evidence of common femoral vein obstruction. No cystic structure found in the popliteal fossa.  *See table(s) above for measurements and observations. Electronically signed by Curt Jews MD on 06/21/2019 at 9:06:27 AM.    Final     ASSESSMENT AND PLAN: This is a very pleasant 68 years old white male recently diagnosed with a stage IV non-small cell lung cancer, adenocarcinoma with no actionable mutations and positive PDL 1 expression of 50%.  The patient is currently undergoing systemic chemotherapy with carboplatin for AUC of 5, Alimta 500 mg/M2 and Keytruda 200 mg IV every 3 weeks status post 1  cycle started last week. He tolerated the first  cycle of his treatment well with no concerning adverse effects. I recommended for the patient to continue his treatment as planned and he is expected to start cycle #2 in 2 weeks. For the recently diagnosed deep venous thrombosis, he will continue his current treatment with Xarelto. I will see the patient back for follow-up visit in 2 weeks for evaluation before the next cycle of his treatment. For the dry cough I will give the patient a refill for Hycodan. He was advised to call immediately if he has any concerning symptoms in the interval. The patient voices understanding of current disease status and treatment options and is in agreement with the current care plan.  All questions were answered. The patient knows to call the clinic with any problems, questions or concerns. We can certainly see the patient much sooner if necessary.  Disclaimer: This note was dictated with voice recognition software. Similar sounding words can inadvertently be transcribed and may not be corrected upon review.

## 2019-06-25 LAB — MYCOBACTERIA,CULT W/FLUOROCHROME SMEAR
MICRO NUMBER:: 483207
SMEAR:: NONE SEEN
SPECIMEN QUALITY:: ADEQUATE

## 2019-06-25 LAB — RESPIRATORY CULTURE OR RESPIRATORY AND SPUTUM CULTURE
MICRO NUMBER:: 491737
SPECIMEN QUALITY:: ADEQUATE

## 2019-06-25 LAB — CULTURE, BLOOD (ROUTINE X 2)
Culture: NO GROWTH
Culture: NO GROWTH

## 2019-06-30 ENCOUNTER — Ambulatory Visit (HOSPITAL_BASED_OUTPATIENT_CLINIC_OR_DEPARTMENT_OTHER): Payer: Medicare Other

## 2019-06-30 ENCOUNTER — Other Ambulatory Visit: Payer: Self-pay

## 2019-06-30 DIAGNOSIS — I63511 Cerebral infarction due to unspecified occlusion or stenosis of right middle cerebral artery: Secondary | ICD-10-CM | POA: Diagnosis not present

## 2019-06-30 DIAGNOSIS — I6601 Occlusion and stenosis of right middle cerebral artery: Secondary | ICD-10-CM | POA: Diagnosis not present

## 2019-06-30 DIAGNOSIS — I6310 Cerebral infarction due to embolism of unspecified precerebral artery: Secondary | ICD-10-CM

## 2019-06-30 DIAGNOSIS — I63412 Cerebral infarction due to embolism of left middle cerebral artery: Secondary | ICD-10-CM | POA: Diagnosis not present

## 2019-06-30 DIAGNOSIS — C3491 Malignant neoplasm of unspecified part of right bronchus or lung: Secondary | ICD-10-CM | POA: Diagnosis not present

## 2019-06-30 DIAGNOSIS — I639 Cerebral infarction, unspecified: Secondary | ICD-10-CM | POA: Diagnosis not present

## 2019-06-30 DIAGNOSIS — Z1159 Encounter for screening for other viral diseases: Secondary | ICD-10-CM | POA: Diagnosis not present

## 2019-06-30 DIAGNOSIS — J690 Pneumonitis due to inhalation of food and vomit: Secondary | ICD-10-CM | POA: Diagnosis not present

## 2019-06-30 DIAGNOSIS — G8194 Hemiplegia, unspecified affecting left nondominant side: Secondary | ICD-10-CM | POA: Diagnosis not present

## 2019-06-30 DIAGNOSIS — Z03818 Encounter for observation for suspected exposure to other biological agents ruled out: Secondary | ICD-10-CM | POA: Diagnosis not present

## 2019-06-30 DIAGNOSIS — J9690 Respiratory failure, unspecified, unspecified whether with hypoxia or hypercapnia: Secondary | ICD-10-CM | POA: Diagnosis not present

## 2019-07-01 ENCOUNTER — Other Ambulatory Visit: Payer: Self-pay

## 2019-07-01 ENCOUNTER — Inpatient Hospital Stay: Payer: Medicare Other

## 2019-07-01 DIAGNOSIS — C3491 Malignant neoplasm of unspecified part of right bronchus or lung: Secondary | ICD-10-CM

## 2019-07-01 LAB — CMP (CANCER CENTER ONLY)
ALT: 16 U/L (ref 0–44)
AST: 15 U/L (ref 15–41)
Albumin: 2.8 g/dL — ABNORMAL LOW (ref 3.5–5.0)
Alkaline Phosphatase: 131 U/L — ABNORMAL HIGH (ref 38–126)
Anion gap: 7 (ref 5–15)
BUN: 16 mg/dL (ref 8–23)
CO2: 27 mmol/L (ref 22–32)
Calcium: 8.4 mg/dL — ABNORMAL LOW (ref 8.9–10.3)
Chloride: 102 mmol/L (ref 98–111)
Creatinine: 0.86 mg/dL (ref 0.61–1.24)
GFR, Est AFR Am: >60 mL/min (ref >60)
GFR, Estimated: >60 mL/min (ref >60)
Glucose, Bld: 93 mg/dL (ref 70–99)
Potassium: 4.6 mmol/L (ref 3.5–5.1)
Sodium: 136 mmol/L (ref 135–145)
Total Bilirubin: 0.5 mg/dL (ref 0.3–1.2)
Total Protein: 6.4 g/dL — ABNORMAL LOW (ref 6.5–8.1)

## 2019-07-01 LAB — CBC WITH DIFFERENTIAL (CANCER CENTER ONLY)
Abs Immature Granulocytes: 0.02 10*3/uL (ref 0.00–0.07)
Basophils Absolute: 0 10*3/uL (ref 0.0–0.1)
Basophils Relative: 0 %
Eosinophils Absolute: 0 10*3/uL (ref 0.0–0.5)
Eosinophils Relative: 0 %
HCT: 33.4 % — ABNORMAL LOW (ref 39.0–52.0)
Hemoglobin: 10.3 g/dL — ABNORMAL LOW (ref 13.0–17.0)
Immature Granulocytes: 0 %
Lymphocytes Relative: 13 %
Lymphs Abs: 0.6 10*3/uL — ABNORMAL LOW (ref 0.7–4.0)
MCH: 26.4 pg (ref 26.0–34.0)
MCHC: 30.8 g/dL (ref 30.0–36.0)
MCV: 85.6 fL (ref 80.0–100.0)
Monocytes Absolute: 0.9 10*3/uL (ref 0.1–1.0)
Monocytes Relative: 19 %
Neutro Abs: 3.1 10*3/uL (ref 1.7–7.7)
Neutrophils Relative %: 68 %
Platelet Count: 215 10*3/uL (ref 150–400)
RBC: 3.9 MIL/uL — ABNORMAL LOW (ref 4.22–5.81)
RDW: 15.1 % (ref 11.5–15.5)
WBC Count: 4.6 10*3/uL (ref 4.0–10.5)
nRBC: 0 % (ref 0.0–0.2)

## 2019-07-03 ENCOUNTER — Encounter (HOSPITAL_COMMUNITY)
Admission: EM | Disposition: A | Discharge status: Rehabilitation Facility | Payer: Self-pay | Source: Home / Self Care | Attending: Neurology

## 2019-07-03 ENCOUNTER — Emergency Department (HOSPITAL_COMMUNITY): Payer: Medicare Other

## 2019-07-03 ENCOUNTER — Emergency Department (HOSPITAL_COMMUNITY): Payer: Medicare Other | Admitting: Certified Registered"

## 2019-07-03 ENCOUNTER — Inpatient Hospital Stay (HOSPITAL_COMMUNITY): Payer: Medicare Other

## 2019-07-03 ENCOUNTER — Inpatient Hospital Stay (HOSPITAL_COMMUNITY)
Admission: EM | Admit: 2019-07-03 | Discharge: 2019-07-10 | DRG: 023 | Disposition: A | Discharge status: Rehabilitation Facility | Payer: Medicare Other | Attending: Neurology | Admitting: Neurology

## 2019-07-03 ENCOUNTER — Other Ambulatory Visit (HOSPITAL_COMMUNITY): Payer: Medicare Other

## 2019-07-03 DIAGNOSIS — R000 Tachycardia, unspecified: Secondary | ICD-10-CM | POA: Diagnosis present

## 2019-07-03 DIAGNOSIS — E876 Hypokalemia: Secondary | ICD-10-CM | POA: Diagnosis present

## 2019-07-03 DIAGNOSIS — R4701 Aphasia: Secondary | ICD-10-CM | POA: Diagnosis present

## 2019-07-03 DIAGNOSIS — Z1159 Encounter for screening for other viral diseases: Secondary | ICD-10-CM | POA: Diagnosis not present

## 2019-07-03 DIAGNOSIS — R471 Dysarthria and anarthria: Secondary | ICD-10-CM | POA: Diagnosis present

## 2019-07-03 DIAGNOSIS — I63511 Cerebral infarction due to unspecified occlusion or stenosis of right middle cerebral artery: Secondary | ICD-10-CM | POA: Diagnosis not present

## 2019-07-03 DIAGNOSIS — R609 Edema, unspecified: Secondary | ICD-10-CM | POA: Diagnosis not present

## 2019-07-03 DIAGNOSIS — Z7901 Long term (current) use of anticoagulants: Secondary | ICD-10-CM | POA: Diagnosis not present

## 2019-07-03 DIAGNOSIS — Z87891 Personal history of nicotine dependence: Secondary | ICD-10-CM

## 2019-07-03 DIAGNOSIS — Z9221 Personal history of antineoplastic chemotherapy: Secondary | ICD-10-CM

## 2019-07-03 DIAGNOSIS — I69391 Dysphagia following cerebral infarction: Secondary | ICD-10-CM

## 2019-07-03 DIAGNOSIS — C3491 Malignant neoplasm of unspecified part of right bronchus or lung: Secondary | ICD-10-CM | POA: Diagnosis present

## 2019-07-03 DIAGNOSIS — I82401 Acute embolism and thrombosis of unspecified deep veins of right lower extremity: Secondary | ICD-10-CM | POA: Diagnosis not present

## 2019-07-03 DIAGNOSIS — Z4659 Encounter for fitting and adjustment of other gastrointestinal appliance and device: Secondary | ICD-10-CM

## 2019-07-03 DIAGNOSIS — D638 Anemia in other chronic diseases classified elsewhere: Secondary | ICD-10-CM | POA: Diagnosis present

## 2019-07-03 DIAGNOSIS — C3490 Malignant neoplasm of unspecified part of unspecified bronchus or lung: Secondary | ICD-10-CM | POA: Diagnosis not present

## 2019-07-03 DIAGNOSIS — Z03818 Encounter for observation for suspected exposure to other biological agents ruled out: Secondary | ICD-10-CM | POA: Diagnosis not present

## 2019-07-03 DIAGNOSIS — J690 Pneumonitis due to inhalation of food and vomit: Secondary | ICD-10-CM | POA: Diagnosis not present

## 2019-07-03 DIAGNOSIS — Z7289 Other problems related to lifestyle: Secondary | ICD-10-CM

## 2019-07-03 DIAGNOSIS — J9690 Respiratory failure, unspecified, unspecified whether with hypoxia or hypercapnia: Secondary | ICD-10-CM | POA: Diagnosis present

## 2019-07-03 DIAGNOSIS — R6510 Systemic inflammatory response syndrome (SIRS) of non-infectious origin without acute organ dysfunction: Secondary | ICD-10-CM | POA: Diagnosis not present

## 2019-07-03 DIAGNOSIS — Z4682 Encounter for fitting and adjustment of non-vascular catheter: Secondary | ICD-10-CM | POA: Diagnosis not present

## 2019-07-03 DIAGNOSIS — R29720 NIHSS score 20: Secondary | ICD-10-CM | POA: Diagnosis present

## 2019-07-03 DIAGNOSIS — R059 Cough, unspecified: Secondary | ICD-10-CM

## 2019-07-03 DIAGNOSIS — D6859 Other primary thrombophilia: Secondary | ICD-10-CM | POA: Diagnosis present

## 2019-07-03 DIAGNOSIS — I959 Hypotension, unspecified: Secondary | ICD-10-CM | POA: Diagnosis present

## 2019-07-03 DIAGNOSIS — R509 Fever, unspecified: Secondary | ICD-10-CM

## 2019-07-03 DIAGNOSIS — I82409 Acute embolism and thrombosis of unspecified deep veins of unspecified lower extremity: Secondary | ICD-10-CM | POA: Diagnosis not present

## 2019-07-03 DIAGNOSIS — I63412 Cerebral infarction due to embolism of left middle cerebral artery: Principal | ICD-10-CM | POA: Diagnosis present

## 2019-07-03 DIAGNOSIS — J181 Lobar pneumonia, unspecified organism: Secondary | ICD-10-CM | POA: Diagnosis not present

## 2019-07-03 DIAGNOSIS — I639 Cerebral infarction, unspecified: Secondary | ICD-10-CM

## 2019-07-03 DIAGNOSIS — I9589 Other hypotension: Secondary | ICD-10-CM | POA: Diagnosis not present

## 2019-07-03 DIAGNOSIS — C799 Secondary malignant neoplasm of unspecified site: Secondary | ICD-10-CM | POA: Diagnosis present

## 2019-07-03 DIAGNOSIS — Z79899 Other long term (current) drug therapy: Secondary | ICD-10-CM

## 2019-07-03 DIAGNOSIS — I6601 Occlusion and stenosis of right middle cerebral artery: Secondary | ICD-10-CM | POA: Diagnosis present

## 2019-07-03 DIAGNOSIS — G8110 Spastic hemiplegia affecting unspecified side: Secondary | ICD-10-CM | POA: Diagnosis not present

## 2019-07-03 DIAGNOSIS — R414 Neurologic neglect syndrome: Secondary | ICD-10-CM | POA: Diagnosis present

## 2019-07-03 DIAGNOSIS — H518 Other specified disorders of binocular movement: Secondary | ICD-10-CM | POA: Diagnosis present

## 2019-07-03 DIAGNOSIS — R591 Generalized enlarged lymph nodes: Secondary | ICD-10-CM | POA: Diagnosis present

## 2019-07-03 DIAGNOSIS — J9601 Acute respiratory failure with hypoxia: Secondary | ICD-10-CM | POA: Diagnosis not present

## 2019-07-03 DIAGNOSIS — R29810 Facial weakness: Secondary | ICD-10-CM | POA: Diagnosis present

## 2019-07-03 DIAGNOSIS — R410 Disorientation, unspecified: Secondary | ICD-10-CM | POA: Diagnosis not present

## 2019-07-03 DIAGNOSIS — R05 Cough: Secondary | ICD-10-CM

## 2019-07-03 DIAGNOSIS — N400 Enlarged prostate without lower urinary tract symptoms: Secondary | ICD-10-CM | POA: Diagnosis present

## 2019-07-03 DIAGNOSIS — I6340 Cerebral infarction due to embolism of unspecified cerebral artery: Secondary | ICD-10-CM | POA: Diagnosis not present

## 2019-07-03 DIAGNOSIS — T17908A Unspecified foreign body in respiratory tract, part unspecified causing other injury, initial encounter: Secondary | ICD-10-CM | POA: Diagnosis not present

## 2019-07-03 DIAGNOSIS — G8114 Spastic hemiplegia affecting left nondominant side: Secondary | ICD-10-CM | POA: Diagnosis not present

## 2019-07-03 DIAGNOSIS — Z515 Encounter for palliative care: Secondary | ICD-10-CM | POA: Diagnosis not present

## 2019-07-03 DIAGNOSIS — G8194 Hemiplegia, unspecified affecting left nondominant side: Secondary | ICD-10-CM | POA: Diagnosis present

## 2019-07-03 DIAGNOSIS — E861 Hypovolemia: Secondary | ICD-10-CM | POA: Diagnosis not present

## 2019-07-03 DIAGNOSIS — R0902 Hypoxemia: Secondary | ICD-10-CM | POA: Diagnosis not present

## 2019-07-03 DIAGNOSIS — Z66 Do not resuscitate: Secondary | ICD-10-CM | POA: Diagnosis not present

## 2019-07-03 DIAGNOSIS — M545 Low back pain: Secondary | ICD-10-CM | POA: Diagnosis present

## 2019-07-03 DIAGNOSIS — E785 Hyperlipidemia, unspecified: Secondary | ICD-10-CM | POA: Diagnosis present

## 2019-07-03 DIAGNOSIS — D649 Anemia, unspecified: Secondary | ICD-10-CM | POA: Diagnosis not present

## 2019-07-03 DIAGNOSIS — K219 Gastro-esophageal reflux disease without esophagitis: Secondary | ICD-10-CM | POA: Diagnosis present

## 2019-07-03 DIAGNOSIS — Z8673 Personal history of transient ischemic attack (TIA), and cerebral infarction without residual deficits: Secondary | ICD-10-CM

## 2019-07-03 DIAGNOSIS — Z86718 Personal history of other venous thrombosis and embolism: Secondary | ICD-10-CM | POA: Diagnosis not present

## 2019-07-03 DIAGNOSIS — R4189 Other symptoms and signs involving cognitive functions and awareness: Secondary | ICD-10-CM | POA: Diagnosis present

## 2019-07-03 DIAGNOSIS — I6389 Other cerebral infarction: Secondary | ICD-10-CM | POA: Diagnosis not present

## 2019-07-03 DIAGNOSIS — R918 Other nonspecific abnormal finding of lung field: Secondary | ICD-10-CM | POA: Diagnosis not present

## 2019-07-03 DIAGNOSIS — I63411 Cerebral infarction due to embolism of right middle cerebral artery: Secondary | ICD-10-CM | POA: Diagnosis not present

## 2019-07-03 DIAGNOSIS — I69354 Hemiplegia and hemiparesis following cerebral infarction affecting left non-dominant side: Secondary | ICD-10-CM | POA: Diagnosis not present

## 2019-07-03 DIAGNOSIS — R4781 Slurred speech: Secondary | ICD-10-CM | POA: Diagnosis not present

## 2019-07-03 DIAGNOSIS — R1312 Dysphagia, oropharyngeal phase: Secondary | ICD-10-CM | POA: Diagnosis present

## 2019-07-03 HISTORY — PX: IR PERCUTANEOUS ART THROMBECTOMY/INFUSION INTRACRANIAL INC DIAG ANGIO: IMG6087

## 2019-07-03 HISTORY — PX: RADIOLOGY WITH ANESTHESIA: SHX6223

## 2019-07-03 HISTORY — PX: IR CT HEAD LTD: IMG2386

## 2019-07-03 LAB — URINALYSIS, ROUTINE W REFLEX MICROSCOPIC
Bilirubin Urine: NEGATIVE
Glucose, UA: NEGATIVE mg/dL
Hgb urine dipstick: NEGATIVE
Ketones, ur: 20 mg/dL — AB
Leukocytes,Ua: NEGATIVE
Nitrite: NEGATIVE
Protein, ur: NEGATIVE mg/dL
Specific Gravity, Urine: >1.046 — ABNORMAL HIGH (ref 1.005–1.030)
pH: 5 (ref 5.0–8.0)

## 2019-07-03 LAB — RAPID URINE DRUG SCREEN, HOSP PERFORMED
Amphetamines: NOT DETECTED
Barbiturates: NOT DETECTED
Benzodiazepines: NOT DETECTED
Cocaine: NOT DETECTED
Opiates: POSITIVE — AB
Tetrahydrocannabinol: NOT DETECTED

## 2019-07-03 LAB — POCT I-STAT 7, (LYTES, BLD GAS, ICA,H+H)
Acid-base deficit: 2 mmol/L (ref 0.0–2.0)
Bicarbonate: 22.3 mmol/L (ref 20.0–28.0)
Calcium, Ion: 1.16 mmol/L (ref 1.15–1.40)
HCT: 22 % — ABNORMAL LOW (ref 39.0–52.0)
Hemoglobin: 7.5 g/dL — ABNORMAL LOW (ref 13.0–17.0)
O2 Saturation: 100 %
Patient temperature: 36.8
Potassium: 3.7 mmol/L (ref 3.5–5.1)
Sodium: 135 mmol/L (ref 135–145)
TCO2: 23 mmol/L (ref 22–32)
pCO2 arterial: 34.1 mmHg (ref 32.0–48.0)
pH, Arterial: 7.424 (ref 7.350–7.450)
pO2, Arterial: 224 mmHg — ABNORMAL HIGH (ref 83.0–108.0)

## 2019-07-03 LAB — DIFFERENTIAL
Abs Immature Granulocytes: 0.04 10*3/uL (ref 0.00–0.07)
Basophils Absolute: 0 10*3/uL (ref 0.0–0.1)
Basophils Relative: 0 %
Eosinophils Absolute: 0 10*3/uL (ref 0.0–0.5)
Eosinophils Relative: 0 %
Immature Granulocytes: 1 %
Lymphocytes Relative: 15 %
Lymphs Abs: 0.9 10*3/uL (ref 0.7–4.0)
Monocytes Absolute: 0.9 10*3/uL (ref 0.1–1.0)
Monocytes Relative: 14 %
Neutro Abs: 4.3 10*3/uL (ref 1.7–7.7)
Neutrophils Relative %: 70 %

## 2019-07-03 LAB — APTT
aPTT: 51 seconds — ABNORMAL HIGH (ref 24–36)
aPTT: 38 seconds — ABNORMAL HIGH (ref 24–36)

## 2019-07-03 LAB — COMPREHENSIVE METABOLIC PANEL
ALT: 14 U/L (ref 0–44)
AST: 23 U/L (ref 15–41)
Albumin: 2.7 g/dL — ABNORMAL LOW (ref 3.5–5.0)
Alkaline Phosphatase: 109 U/L (ref 38–126)
Anion gap: 11 (ref 5–15)
BUN: 16 mg/dL (ref 8–23)
CO2: 22 mmol/L (ref 22–32)
Calcium: 8.2 mg/dL — ABNORMAL LOW (ref 8.9–10.3)
Chloride: 100 mmol/L (ref 98–111)
Creatinine, Ser: 0.89 mg/dL (ref 0.61–1.24)
GFR calc Af Amer: >60 mL/min (ref >60)
GFR calc non Af Amer: >60 mL/min (ref >60)
Glucose, Bld: 124 mg/dL — ABNORMAL HIGH (ref 70–99)
Potassium: 3.5 mmol/L (ref 3.5–5.1)
Sodium: 133 mmol/L — ABNORMAL LOW (ref 135–145)
Total Bilirubin: 0.6 mg/dL (ref 0.3–1.2)
Total Protein: 5.5 g/dL — ABNORMAL LOW (ref 6.5–8.1)

## 2019-07-03 LAB — CBC
HCT: 30.5 % — ABNORMAL LOW (ref 39.0–52.0)
Hemoglobin: 9.6 g/dL — ABNORMAL LOW (ref 13.0–17.0)
MCH: 26.4 pg (ref 26.0–34.0)
MCHC: 31.5 g/dL (ref 30.0–36.0)
MCV: 83.8 fL (ref 80.0–100.0)
Platelets: 256 10*3/uL (ref 150–400)
RBC: 3.64 MIL/uL — ABNORMAL LOW (ref 4.22–5.81)
RDW: 14.8 % (ref 11.5–15.5)
WBC: 6.1 10*3/uL (ref 4.0–10.5)
nRBC: 0 % (ref 0.0–0.2)

## 2019-07-03 LAB — I-STAT CHEM 8, ED
BUN: 15 mg/dL (ref 8–23)
Calcium, Ion: 1.09 mmol/L — ABNORMAL LOW (ref 1.15–1.40)
Chloride: 99 mmol/L (ref 98–111)
Creatinine, Ser: 0.7 mg/dL (ref 0.61–1.24)
Glucose, Bld: 128 mg/dL — ABNORMAL HIGH (ref 70–99)
HCT: 29 % — ABNORMAL LOW (ref 39.0–52.0)
Hemoglobin: 9.9 g/dL — ABNORMAL LOW (ref 13.0–17.0)
Potassium: 3.3 mmol/L — ABNORMAL LOW (ref 3.5–5.1)
Sodium: 133 mmol/L — ABNORMAL LOW (ref 135–145)
TCO2: 23 mmol/L (ref 22–32)

## 2019-07-03 LAB — LIPID PANEL
Cholesterol: 117 mg/dL (ref 0–200)
HDL: 29 mg/dL — ABNORMAL LOW (ref >40)
LDL Cholesterol: 69 mg/dL (ref 0–99)
Total CHOL/HDL Ratio: 4 RATIO
Triglycerides: 93 mg/dL (ref <150)
VLDL: 19 mg/dL (ref 0–40)

## 2019-07-03 LAB — MRSA PCR SCREENING: MRSA by PCR: NEGATIVE

## 2019-07-03 LAB — ETHANOL: Alcohol, Ethyl (B): <10 mg/dL (ref <10)

## 2019-07-03 LAB — PROTIME-INR
INR: 3.6 — ABNORMAL HIGH (ref 0.8–1.2)
Prothrombin Time: 35.1 seconds — ABNORMAL HIGH (ref 11.4–15.2)

## 2019-07-03 LAB — SARS CORONAVIRUS 2 BY RT PCR (HOSPITAL ORDER, PERFORMED IN ~~LOC~~ HOSPITAL LAB): SARS Coronavirus 2: NEGATIVE

## 2019-07-03 LAB — CBG MONITORING, ED: Glucose-Capillary: 116 mg/dL — ABNORMAL HIGH (ref 70–99)

## 2019-07-03 SURGERY — IR WITH ANESTHESIA
Anesthesia: General

## 2019-07-03 MED ORDER — STROKE: EARLY STAGES OF RECOVERY BOOK
Freq: Once | Status: DC
  Filled 2019-07-03: qty 1

## 2019-07-03 MED ORDER — ROCURONIUM 10MG/ML (10ML) SYRINGE FOR MEDFUSION PUMP - OPTIME
INTRAVENOUS | Status: DC | PRN
  Administered 2019-07-03: 50 mg via INTRAVENOUS
  Administered 2019-07-03: 20 mg via INTRAVENOUS
  Administered 2019-07-03: 30 mg via INTRAVENOUS

## 2019-07-03 MED ORDER — DEXAMETHASONE SODIUM PHOSPHATE 10 MG/ML IJ SOLN
INTRAMUSCULAR | Status: DC | PRN
  Administered 2019-07-03: 10 mg via INTRAVENOUS

## 2019-07-03 MED ORDER — CLEVIDIPINE BUTYRATE 0.5 MG/ML IV EMUL
0.0000 mg/h | INTRAVENOUS | Status: AC
  Administered 2019-07-03: 19:00:00 6 mg/h via INTRAVENOUS
  Administered 2019-07-03: 14:00:00 2 mg/h via INTRAVENOUS
  Filled 2019-07-03 (×2): qty 50

## 2019-07-03 MED ORDER — PRAVASTATIN SODIUM 10 MG PO TABS
20.0000 mg | ORAL_TABLET | Freq: Every day | ORAL | Status: DC
  Administered 2019-07-03: 20 mg
  Filled 2019-07-03: qty 2

## 2019-07-03 MED ORDER — DIPHENHYDRAMINE HCL 12.5 MG/5ML PO ELIX
25.0000 mg | ORAL_SOLUTION | Freq: Four times a day (QID) | ORAL | Status: DC | PRN
  Administered 2019-07-03: 25 mg
  Filled 2019-07-03: qty 10

## 2019-07-03 MED ORDER — LIDOCAINE HCL 1 % IJ SOLN
INTRAMUSCULAR | Status: AC
  Filled 2019-07-03: qty 20

## 2019-07-03 MED ORDER — CHLORHEXIDINE GLUCONATE CLOTH 2 % EX PADS
6.0000 | MEDICATED_PAD | Freq: Every day | CUTANEOUS | Status: DC
  Administered 2019-07-03 – 2019-07-09 (×6): 6 via TOPICAL

## 2019-07-03 MED ORDER — LIDOCAINE HCL (CARDIAC) PF 100 MG/5ML IV SOSY
PREFILLED_SYRINGE | INTRAVENOUS | Status: DC | PRN
  Administered 2019-07-03: 40 mg via INTRATRACHEAL

## 2019-07-03 MED ORDER — FENTANYL CITRATE (PF) 100 MCG/2ML IJ SOLN
25.0000 ug | INTRAMUSCULAR | Status: DC | PRN
  Administered 2019-07-03: 100 ug via INTRAVENOUS
  Administered 2019-07-03 – 2019-07-04 (×3): 50 ug via INTRAVENOUS
  Administered 2019-07-04: 100 ug via INTRAVENOUS
  Administered 2019-07-04 (×2): 50 ug via INTRAVENOUS
  Filled 2019-07-03: qty 4
  Filled 2019-07-03 (×2): qty 2

## 2019-07-03 MED ORDER — CEFAZOLIN SODIUM-DEXTROSE 2-3 GM-%(50ML) IV SOLR
INTRAVENOUS | Status: DC | PRN
  Administered 2019-07-03: 2 g via INTRAVENOUS

## 2019-07-03 MED ORDER — PROPOFOL 1000 MG/100ML IV EMUL
INTRAVENOUS | Status: AC
  Filled 2019-07-03: qty 100

## 2019-07-03 MED ORDER — PROPOFOL 10 MG/ML IV BOLUS
INTRAVENOUS | Status: DC | PRN
  Administered 2019-07-03 (×2): 30 mg via INTRAVENOUS
  Administered 2019-07-03: 100 mg via INTRAVENOUS

## 2019-07-03 MED ORDER — TIROFIBAN HCL IN NACL 5-0.9 MG/100ML-% IV SOLN
INTRAVENOUS | Status: AC
  Filled 2019-07-03: qty 100

## 2019-07-03 MED ORDER — SODIUM CHLORIDE 0.9 % IV BOLUS
500.0000 mL | Freq: Once | INTRAVENOUS | Status: AC
  Administered 2019-07-03: 500 mL via INTRAVENOUS

## 2019-07-03 MED ORDER — ACETAMINOPHEN 650 MG RE SUPP: 650.0000 mg | RECTAL | Status: DC | PRN

## 2019-07-03 MED ORDER — SODIUM CHLORIDE 0.9 % IV SOLN
INTRAVENOUS | Status: DC
  Administered 2019-07-03 – 2019-07-06 (×2): via INTRAVENOUS

## 2019-07-03 MED ORDER — HYDRALAZINE HCL 20 MG/ML IJ SOLN: 10.0000 mg | Freq: Four times a day (QID) | INTRAMUSCULAR | Status: DC | PRN

## 2019-07-03 MED ORDER — ACETAMINOPHEN 160 MG/5ML PO SOLN
650.0000 mg | ORAL | Status: DC | PRN
  Administered 2019-07-03 – 2019-07-09 (×5): 650 mg
  Filled 2019-07-03 (×5): qty 20.3

## 2019-07-03 MED ORDER — NITROGLYCERIN 1 MG/10 ML FOR IR/CATH LAB
INTRA_ARTERIAL | Status: AC
  Filled 2019-07-03: qty 10

## 2019-07-03 MED ORDER — NITROGLYCERIN 1 MG/10 ML FOR IR/CATH LAB
INTRA_ARTERIAL | Status: AC | PRN
  Administered 2019-07-03 (×4): 25 ug via INTRA_ARTERIAL

## 2019-07-03 MED ORDER — ORAL CARE MOUTH RINSE
15.0000 mL | OROMUCOSAL | Status: DC
  Administered 2019-07-03 – 2019-07-04 (×15): 15 mL via OROMUCOSAL

## 2019-07-03 MED ORDER — IOHEXOL 350 MG/ML SOLN
50.0000 mL | Freq: Once | INTRAVENOUS | Status: AC | PRN
  Administered 2019-07-03: 50 mL via INTRAVENOUS

## 2019-07-03 MED ORDER — PROPOFOL 1000 MG/100ML IV EMUL
5.0000 ug/kg/min | INTRAVENOUS | Status: DC
  Administered 2019-07-03 (×2): 30 ug/kg/min via INTRAVENOUS
  Filled 2019-07-03 (×2): qty 100

## 2019-07-03 MED ORDER — CEFAZOLIN SODIUM-DEXTROSE 2-4 GM/100ML-% IV SOLN
INTRAVENOUS | Status: AC
  Filled 2019-07-03: qty 100

## 2019-07-03 MED ORDER — IOHEXOL 350 MG/ML SOLN
75.0000 mL | Freq: Once | INTRAVENOUS | Status: AC | PRN
  Administered 2019-07-03: 02:00:00 75 mL via INTRAVENOUS

## 2019-07-03 MED ORDER — SODIUM CHLORIDE 0.9 % IV SOLN
INTRAVENOUS | Status: DC | PRN
  Administered 2019-07-03: 03:00:00 via INTRAVENOUS

## 2019-07-03 MED ORDER — CLOPIDOGREL BISULFATE 300 MG PO TABS
ORAL_TABLET | ORAL | Status: AC
  Filled 2019-07-03: qty 1

## 2019-07-03 MED ORDER — SENNOSIDES-DOCUSATE SODIUM 8.6-50 MG PO TABS: 1.0000 | ORAL_TABLET | Freq: Every evening | ORAL | Status: DC | PRN

## 2019-07-03 MED ORDER — ACETAMINOPHEN 325 MG PO TABS: 650.0000 mg | ORAL_TABLET | ORAL | Status: DC | PRN

## 2019-07-03 MED ORDER — EPTIFIBATIDE 20 MG/10ML IV SOLN
INTRAVENOUS | Status: AC
  Filled 2019-07-03: qty 10

## 2019-07-03 MED ORDER — ACETAMINOPHEN 325 MG PO TABS
650.0000 mg | ORAL_TABLET | ORAL | Status: DC | PRN
  Administered 2019-07-06 – 2019-07-08 (×4): 650 mg via ORAL
  Filled 2019-07-03 (×3): qty 2

## 2019-07-03 MED ORDER — TICAGRELOR 90 MG PO TABS
ORAL_TABLET | ORAL | Status: AC
  Filled 2019-07-03: qty 2

## 2019-07-03 MED ORDER — CHLORHEXIDINE GLUCONATE 0.12% ORAL RINSE (MEDLINE KIT)
15.0000 mL | Freq: Two times a day (BID) | OROMUCOSAL | Status: DC
  Administered 2019-07-03 – 2019-07-04 (×3): 15 mL via OROMUCOSAL

## 2019-07-03 MED ORDER — PHENYLEPHRINE HCL-NACL 10-0.9 MG/250ML-% IV SOLN
25.0000 ug/min | INTRAVENOUS | Status: DC
  Filled 2019-07-03: qty 250

## 2019-07-03 MED ORDER — FENTANYL CITRATE (PF) 100 MCG/2ML IJ SOLN
INTRAMUSCULAR | Status: AC
  Filled 2019-07-03: qty 2

## 2019-07-03 MED ORDER — PROPOFOL 500 MG/50ML IV EMUL
INTRAVENOUS | Status: DC | PRN
  Administered 2019-07-03: 100 ug/kg/min via INTRAVENOUS

## 2019-07-03 MED ORDER — DOCUSATE SODIUM 50 MG/5ML PO LIQD
100.0000 mg | Freq: Two times a day (BID) | ORAL | Status: DC
  Administered 2019-07-03: 100 mg
  Filled 2019-07-03 (×2): qty 10

## 2019-07-03 MED ORDER — LACTATED RINGERS IV SOLN
INTRAVENOUS | Status: DC | PRN
  Administered 2019-07-03: 03:00:00 via INTRAVENOUS

## 2019-07-03 MED ORDER — IOHEXOL 300 MG/ML  SOLN
150.0000 mL | Freq: Once | INTRAMUSCULAR | Status: AC | PRN
  Administered 2019-07-03: 07:00:00 104 mL via INTRAVENOUS

## 2019-07-03 MED ORDER — SUCCINYLCHOLINE 20MG/ML (10ML) SYRINGE FOR MEDFUSION PUMP - OPTIME
INTRAMUSCULAR | Status: DC | PRN
  Administered 2019-07-03: 80 mg via INTRAVENOUS

## 2019-07-03 MED ORDER — PANTOPRAZOLE SODIUM 40 MG PO PACK
40.0000 mg | PACK | Freq: Every day | ORAL | Status: DC
  Administered 2019-07-03: 40 mg
  Filled 2019-07-03 (×2): qty 20

## 2019-07-03 MED ORDER — SODIUM CHLORIDE 0.9 % IV SOLN: 250.0000 mL | INTRAVENOUS | Status: DC

## 2019-07-03 MED ORDER — SODIUM CHLORIDE 0.9 % IV SOLN
INTRAVENOUS | Status: DC
  Administered 2019-07-04 – 2019-07-05 (×2): via INTRAVENOUS

## 2019-07-03 MED ORDER — SODIUM CHLORIDE 0.9 % IV SOLN
INTRAVENOUS | Status: DC | PRN
  Administered 2019-07-03: 04:00:00 25 ug/min via INTRAVENOUS

## 2019-07-03 MED ORDER — MIDAZOLAM HCL 2 MG/2ML IJ SOLN
1.0000 mg | INTRAMUSCULAR | Status: DC | PRN
  Administered 2019-07-03: 1 mg via INTRAVENOUS
  Filled 2019-07-03: qty 2

## 2019-07-03 MED ORDER — ASPIRIN 325 MG PO TABS
ORAL_TABLET | ORAL | Status: AC
  Filled 2019-07-03: qty 1

## 2019-07-03 MED ORDER — ACETAMINOPHEN 160 MG/5ML PO SOLN: 650.0000 mg | ORAL | Status: DC | PRN

## 2019-07-03 MED ORDER — IOHEXOL 300 MG/ML  SOLN
150.0000 mL | Freq: Once | INTRAMUSCULAR | Status: AC | PRN
  Administered 2019-07-03: 100 mL via INTRAVENOUS

## 2019-07-03 MED ORDER — ADULT MULTIVITAMIN W/MINERALS CH
1.0000 | ORAL_TABLET | Freq: Every day | ORAL | Status: DC
  Administered 2019-07-03 – 2019-07-10 (×6): 1
  Filled 2019-07-03 (×7): qty 1

## 2019-07-03 MED ORDER — FENTANYL CITRATE (PF) 100 MCG/2ML IJ SOLN
INTRAMUSCULAR | Status: DC | PRN
  Administered 2019-07-03 (×2): 25 ug via INTRAVENOUS

## 2019-07-03 MED ORDER — NITROGLYCERIN 1 MG/10 ML FOR IR/CATH LAB
INTRA_ARTERIAL | Status: AC | PRN
  Administered 2019-07-03 (×2): 25 ug via INTRA_ARTERIAL

## 2019-07-03 MED ORDER — FOLIC ACID 1 MG PO TABS
1.0000 mg | ORAL_TABLET | Freq: Every day | ORAL | Status: DC
  Administered 2019-07-03 – 2019-07-10 (×6): 1 mg
  Filled 2019-07-03 (×7): qty 1

## 2019-07-03 MED ORDER — HEPARIN (PORCINE) 25000 UT/250ML-% IV SOLN
750.0000 [IU]/h | INTRAVENOUS | Status: DC
  Administered 2019-07-03: 19:00:00 600 [IU]/h via INTRAVENOUS
  Filled 2019-07-03: qty 250

## 2019-07-03 NOTE — Sedation Documentation (Signed)
[facility] called to [employer], Therapist, sports.

## 2019-07-03 NOTE — Anesthesia Postprocedure Evaluation (Signed)
Anesthesia Post Note  Patient: Kyle Garcia  Procedure(s) Performed: IR WITH ANESTHESIA (N/A )     Patient location during evaluation: SICU Anesthesia Type: General Level of consciousness: sedated Pain management: pain level controlled Vital Signs Assessment: post-procedure vital signs reviewed and stable Respiratory status: patient remains intubated per anesthesia plan Cardiovascular status: stable Postop Assessment: no apparent nausea or vomiting Anesthetic complications: no    Last Vitals:  Vitals:   07/03/19 0654 07/03/19 0700  BP:    Pulse:  92  Resp:  18  Temp:  (!) 35.6 C  SpO2: 100% 100%    Last Pain:  Vitals:   07/03/19 0700  TempSrc: Bladder  PainSc:                  Kyle Garcia

## 2019-07-03 NOTE — Procedures (Signed)
S/O RT common carotid arteriogram followed complete revascularization of occluded MCA dominant mid division with x2 passes with 79mm x 82mm embotrap and x 3 passes with solitaire x 4 x 40 mm retriever device achieving a TICI 3 revascularization. Also partial revascularization of occluded ACA A2 A3 region with x 1 pass with solitaire X 29mmx 40 mm device.

## 2019-07-03 NOTE — Sedation Documentation (Signed)
Spoke with [place], [place], 4N Charge, pt assigned to [phone]. Cheryl in pt transport to bring bed with monitor and O2 tank.

## 2019-07-03 NOTE — ED Notes (Signed)
Pt taken to IR

## 2019-07-03 NOTE — Progress Notes (Signed)
Patient ID: Kyle Garcia, male   DOB: Mar 23, 1951, 68 y.o.   MRN: [id number] INR. 34 Y RT H M MRSS 1. LSW?800pm. LN. New onset of Lt gaze deviation,rt sided hemiplegia and aphasia. CT brain No ICH ASSPECTS 10. CTA Occluded RT MCA  Dominant middle division ?distal MCA M1at trifurcation.  CTP perfusion core vol of 20ml  With mismatch penumbra of 19ml.  Endovascular revascu larization D/W spouse. Reasos,risks,alternatived reviewed. Risks of ICH of 10 %,worsening neurodeficit,death ,inability to revascularize and vascular  trauma reviewed. Spouse expressed understanding and gave informed consent tp proceed with the treatment. S.Izayiah Tibbitts MD

## 2019-07-03 NOTE — Progress Notes (Signed)
Patient just arrived to the unit from IR. Patient now on Mechanical Ventilation. Patient is currently stable at this time.

## 2019-07-03 NOTE — Progress Notes (Signed)
OT Cancellation Note  Patient Details Name: Song Garris MRN: [id number] DOB: [birth year]   Cancelled Treatment:    Reason Eval/Treat Not Completed: Patient not medically ready(Sheath and intubated )  Richelle Ito, OTR/L  Acute Rehabilitation Services Pager: [phone] Office: [phone] .  07/03/2019, 9:18 AM

## 2019-07-03 NOTE — Progress Notes (Addendum)
NAME:  Kyle Garcia, MRN:  [id number], DOB:  1951/11/04, LOS: 0 ADMISSION DATE:  07/03/2019, CONSULTATION DATE:  07/03/2019 REFERRING MD:  Dr. Antony Contras, CHIEF COMPLAINT:  Acute Ischemic R MCA Stroke   Brief History   68yo M with history of R DVT on Xarelto, adenocarcinoma of the R lung, prior strokes on imaging, HLD, and GERD, who p/w left-sided hemiplegia and R gaze deviation. Found to have R MCA and ACA occlusions. Now s/p mechanical thrombectomy with VIR on 7/10.   History of present illness   Kyle Garcia is a 68 y.o. male with past medical history of DVT on Xarelto, adenocarcinoma of the right lung, hyperlipidemia, prior strokes noted on MRI presents to the ED as a stroke alert for left-sided weakness.  Last known normal was 10 PM when patient went to bed he woke up around midnight and noticed that he was unable to move the left side of his body.  EMS was called and on arrival patient had forced right gaze deviation, left-sided neglect and plegia.  He was awake and oriented.  He had taken Xarelto at 5 PM last night.  Stat CT head was negative for hemorrhage.  CTA was suggestive for a right M2 M3 occlusion with CT perfusion showing a large perfusion mismatch (124 cc penumbra 30 cc core).  Patient was taken to IR for mechanical thrombectomy.  Date last known well: 07/02/2019 Time last known well: 10 PM tPA Given: No patient on anticoagulation NIHSS: 18 Baseline MRS 0   Past Medical History   Past Medical History:  Diagnosis Date  . Cancer ([place])    stage 4 adenocarcinoma right lung  . GERD (gastroesophageal reflux disease)   . Hyperlipidemia   . Stroke ([place])    25-30 emboli seen on screening MRI    Significant Hospital Events   7/10 - Admission, Thrombectomy w/ IR, Transfer to ICU  Consults:  VIR PCCM  Procedures:  7/10 - VIR Mechanical Thromebctomy  Significant Diagnostic Tests:  MRI Brain 6/17: 25-30 micro emboli vs mets within L cerebellum and both cerebral  hemispheres  CTA Head/Neck 7/10: Proximal R M2/M3 occlusion. Internal carotids without stenosis.   MRI Brain 7/10: Multiple areas of acute infarction in R parietal region and R cingulate gyrus. Numerous punctate acute infarctions on both parietal regions L>R, likely 2/2 micro emboli.  Micro Data:  7/10 - SARS CoV2 & MRSA: Negative  Antimicrobials:  N/A   Interim history/subjective:  Patient intubated and sedated on arrival to ICU. Arousable and following commands on the right side. Breathing comfortably on mechanical ventilation.   Objective   Blood pressure (!) 99/55, pulse 80, temperature 98.2 F (36.8 C), resp. rate 16, height 5\' 11"  (1.803 m), weight 67.3 kg, SpO2 100 %.    Vent Mode: PRVC FiO2 (%):  [40 %-60 %] 40 % Set Rate:  [16 bmp] 16 bmp Vt Set:  [600 mL] 600 mL PEEP:  [5 cmH20] 5 cmH20 Plateau Pressure:  [12 cmH20] 12 cmH20   Intake/Output Summary (Last 24 hours) at 07/03/2019 1054 Last data filed at 07/03/2019 5631 Gross per 24 hour  Intake 1169.94 ml  Output 555 ml  Net 614.94 ml   Filed Weights   07/03/19 0304  Weight: 67.3 kg    Examination: General: Well-developed Caucasian male in NAD. Intubated and sedated HENT: PERRL, conjunctiva clear, MMM. ET tube in place. Lungs: CTA anteriorly. Normal WOB on vent Cardiovascular: RRR, no m/r/g Abdomen: Soft, non-distended. +BS Extremities: 2+ RLE pitting edema.  Palpable radial and L DP pulses. R DP pulse difficult to assess 2/2 swelling. Cap refill <3s throughout. Neuro: Sedated and somnolent. Opens eyes briefly to voice. R gaze preference with no movement past midline. Following commands on R side only. No response to noxious stimuli on L side.  GU: Foley in place, draining amber-olored urine  Resolved Hospital Problem list   N/A  Assessment & Plan:  Kyle Garcia is a 68yo M with history of R DVT on Xarelto, adenocarcinoma of the R lung, prior strokes on imaging, HLD, and GERD, who p/w left-sided hemiplegia  and R gaze deviation and was found to have R MCA and ACA occlusions. Now s/p mechanical thrombectomy with VIR on 7/10. Acute ischemic stroke, DVT, and evidence of multiple prior emboli likely 2/2 hypercoagulable state due to lung adenocarcinoma despite anticoagulation with Xarelto. Brain metastasis must also be considered. Low concern for infective endocarditis at this time given lack of fever or stigmata. Plan to further eval for PFO and marantic endocarditis, and determine most appropriate plan for anticoagulation moving forward.  Neurological Acute R MCA & ACA Infarction s/p Thrombectomy: - s/p mechanical thrombectomy with VIR on 7/10 - Neurology following, appreciate recs - SBP goal 120-140 - Statin as below - Neuro checks - f/u Bubble study - f/u Lipid Profile and HgbA1C - Consider TEE to better eval for marantic endocarditis  Cardiology RLE DVT: - RLE U/S 6/27 - acute DVT of R common femoral vein - Xarelto held on admission given initial c/f post-procedural hemorrhage. Plan to restart appropriate anticoagulation given MRI without evidence of hemorrhage. Recommend switching to LMWH given Xarelto failure.  Hypotension:  Likely 2/2 to propofol and dehydration - Fluid resuscitation with NS - Phenylephrine pressor support for goal SBP 120-140 - Sedation adjustments as below  Hyperlipidemia - Restart home pravastatin 20mg   Respiratory Mechanical Ventilation - Vent settings reviewed and adjusted - d/c Propofol 2/2 hypotension - Fentanyl and Versed PRN - Extubation pending improvement in L-sided neglect  Adenocarcinoma of R Lung - On chemotherapy (s 6/24)  Best practice:  Diet: NPO Pain/Anxiety/Delirium protocol (if indicated):  VAP protocol (if indicated): per protocol DVT prophylaxis: Plan to restart anticoagulation GI prophylaxis: PPI Glucose control: per protocol Mobility: Bedrest Code Status: Full Family Communication: Will reach out to wife Disposition: ICU  Labs    CBC: Recent Labs  Lab 07/01/19 1059 07/03/19 0205 07/03/19 0213 07/03/19 0828  WBC 4.6 6.1  --   --   NEUTROABS 3.1 4.3  --   --   HGB 10.3* 9.6* 9.9* 7.5*  HCT 33.4* 30.5* 29.0* 22.0*  MCV 85.6 83.8  --   --   PLT 215 256  --   --     Basic Metabolic Panel: Recent Labs  Lab 07/01/19 1059 07/03/19 0205 07/03/19 0213 07/03/19 0828  NA 136 133* 133* 135  K 4.6 3.5 3.3* 3.7  CL 102 100 99  --   CO2 27 22  --   --   GLUCOSE 93 124* 128*  --   BUN 16 16 15   --   CREATININE 0.86 0.89 0.70  --   CALCIUM 8.4* 8.2*  --   --    GFR: Estimated Creatinine Clearance: 85.3 mL/min (by C-G formula based on SCr of 0.7 mg/dL). Recent Labs  Lab 07/01/19 1059 07/03/19 0205  WBC 4.6 6.1    Liver Function Tests: Recent Labs  Lab 07/01/19 1059 07/03/19 0205  AST 15 23  ALT 16 14  ALKPHOS 131*  109  BILITOT 0.5 0.6  PROT 6.4* 5.5*  ALBUMIN 2.8* 2.7*   No results for input(s): LIPASE, AMYLASE in the last 168 hours. No results for input(s): AMMONIA in the last 168 hours.  ABG    Component Value Date/Time   PHART 7.424 07/03/2019 0828   PCO2ART 34.1 07/03/2019 0828   PO2ART 224.0 (H) 07/03/2019 0828   HCO3 22.3 07/03/2019 0828   TCO2 23 07/03/2019 0828   ACIDBASEDEF 2.0 07/03/2019 0828   O2SAT 100.0 07/03/2019 0828     Coagulation Profile: Recent Labs  Lab 07/03/19 0205  INR 3.6*    Cardiac Enzymes: No results for input(s): CKTOTAL, CKMB, CKMBINDEX, TROPONINI in the last 168 hours.  HbA1C: Hgb A1c MFr Bld  Date/Time Value Ref Range Status  12/02/2018 09:06 AM 5.3 <5.7 % of total Hgb Final    Comment:    For the purpose of screening for the presence of diabetes: . <5.7%       Consistent with the absence of diabetes 5.7-6.4%    Consistent with increased risk for diabetes             (prediabetes) > or =6.5%  Consistent with diabetes . This assay result is consistent with a decreased risk of diabetes. . Currently, no consensus exists regarding use of  hemoglobin A1c for diagnosis of diabetes in children. . According to American Diabetes Association (ADA) guidelines, hemoglobin A1c <7.0% represents optimal control in non-pregnant diabetic patients. Different metrics may apply to specific patient populations.  Standards of Medical Care in Diabetes(ADA). .     CBG: Recent Labs  Lab 07/03/19 0200  GLUCAP 116*    Review of Systems:   Unable to assess as patient is sedated and intubated.  Past Medical History  He,  has a past medical history of Cancer ([place]), GERD (gastroesophageal reflux disease), Hyperlipidemia, and Stroke ([place]).   Surgical History    Past Surgical History:  Procedure Laterality Date  . CATARACT EXTRACTION  2016  . EYE SURGERY    . VIDEO BRONCHOSCOPY Bilateral 05/21/2019   Procedure: VIDEO BRONCHOSCOPY WITH FLUORO;  Surgeon: Collene Gobble, MD;  Location: [facility] ENDOSCOPY;  Service: Cardiopulmonary;  Laterality: Bilateral;     Social History   reports that he quit smoking about 4 months ago. His smoking use included cigars. He has never used smokeless tobacco. He reports current alcohol use of about 4.0 - 7.0 standard drinks of alcohol per week. He reports that he does not use drugs.   Family History   His family history is not on file.   Allergies No Known Allergies   Home Medications  Prior to Admission medications   Medication Sig Start Date End Date Taking? Authorizing Provider  acetaminophen (TYLENOL) 500 MG tablet Take 1,000 mg by mouth every 6 (six) hours as needed for headache.    [provider]  azithromycin (ZITHROMAX) 500 MG tablet Take 1 tablet (500 mg total) by mouth daily. 06/22/19   Annita Brod, MD  cefpodoxime (VANTIN) 200 MG tablet Take 1 tablet (200 mg total) by mouth 2 (two) times daily. 06/22/19   Annita Brod, MD  folic acid (FOLVITE) 1 MG tablet Take 1 tablet (1 mg total) by mouth daily. 06/08/19   Curt Bears, MD  HYDROcodone-homatropine [facility]) 5-1.5  MG/5ML syrup Take 5 mLs by mouth every 6 (six) hours as needed for cough. 06/24/19   Curt Bears, MD  Multiple Vitamins-Minerals (MULTIVITAMIN WITH MINERALS) tablet Take 1 tablet by mouth daily.  05/19/10   [provider]  Multiple Vitamins-Minerals (PRESERVISION AREDS 2) CAPS Take 1 capsule by mouth 2 (two) times daily.    [provider]  omeprazole (PRILOSEC) 40 MG capsule Take 1 capsule (40 mg total) by mouth daily. 12/30/18   Susy Frizzle, MD  Polyvinyl Alcohol-Povidone (REFRESH OP) Place 1 drop into both eyes every evening.    [provider]  pravastatin (PRAVACHOL) 20 MG tablet TAKE 1 TABLET DAILY AT 6 PM Patient taking differently: Take 20 mg by mouth daily at 6 PM.  02/18/19   Susy Frizzle, MD  prochlorperazine (COMPAZINE) 10 MG tablet Take 1 tablet (10 mg total) by mouth every 6 (six) hours as needed for nausea or vomiting. 06/08/19   Curt Bears, MD  Rivaroxaban 15 & 20 MG TBPK Take as directed on package: Start with one 15mg  tablet by mouth twice a day with food. On Day 22, switch to one 20mg  tablet once a day with food. 06/17/19   Heilingoetter, Cassandra L, PA-C     Critical care time: 40 minutes   -- Armida Sans, [facility] 07/03/19

## 2019-07-03 NOTE — Progress Notes (Signed)
Patient to 4N25 at [phone], BLE pulses verified with Elta Guadeloupe RN and R groin site visualized as level 0. Patient personal belongings at bedside are one shirt and one pair of underwear.  Candy Sledge, RN

## 2019-07-03 NOTE — Anesthesia Procedure Notes (Signed)
Procedure Name: Intubation Date/Time: 07/03/2019 3:10 AM Performed by: Claris Che, CRNA Pre-anesthesia Checklist: Patient identified, Emergency Drugs available, Suction available, Patient being monitored and Timeout performed Patient Re-evaluated:Patient Re-evaluated prior to induction Oxygen Delivery Method: Circle system utilized Preoxygenation: Pre-oxygenation with 100% oxygen Induction Type: IV induction, Rapid sequence and Cricoid Pressure applied Laryngoscope Size: Mac and 3 Grade View: Grade II Tube size: 8.0 mm Number of attempts: 1 Airway Equipment and Method: Stylet Placement Confirmation: ETT inserted through vocal cords under direct vision,  positive ETCO2 and breath sounds checked- equal and bilateral Secured at: 24 cm Tube secured with: Tape Dental Injury: Teeth and Oropharynx as per pre-operative assessment

## 2019-07-03 NOTE — Anesthesia Preprocedure Evaluation (Signed)
Anesthesia Evaluation  Patient identified by MRN, date of birth, ID band Patient awake    Reviewed: Allergy & Precautions, NPO status , Patient's Chart, lab work & pertinent test results  Airway Mallampati: II  TM Distance: >3 FB     Dental  (+) Dental Advisory Given   Pulmonary former smoker,  Lung CA   breath sounds clear to auscultation       Cardiovascular + DVT   Rhythm:Regular Rate:Tachycardia     Neuro/Psych CVA (code stroke)    GI/Hepatic Neg liver ROS, GERD  ,  Endo/Other  negative endocrine ROS  Renal/GU negative Renal ROS     Musculoskeletal   Abdominal   Peds  Hematology  (+) anemia ,   Anesthesia Other Findings   Reproductive/Obstetrics                             Lab Results  Component Value Date   WBC 6.1 07/03/2019   HGB 9.9 (L) 07/03/2019   HCT 29.0 (L) 07/03/2019   MCV 83.8 07/03/2019   PLT 256 07/03/2019   Lab Results  Component Value Date   CREATININE 0.70 07/03/2019   BUN 15 07/03/2019   NA 133 (L) 07/03/2019   K 3.3 (L) 07/03/2019   CL 99 07/03/2019   CO2 22 07/03/2019    Anesthesia Physical Anesthesia Plan  ASA: IV and emergent  Anesthesia Plan: General   Post-op Pain Management:    Induction: Intravenous  PONV Risk Score and Plan: 2 and Dexamethasone, Ondansetron and Treatment may vary due to age or medical condition  Airway Management Planned: Oral ETT  Additional Equipment: Arterial line  Intra-op Plan:   Post-operative Plan: Possible Post-op intubation/ventilation  Informed Consent: I have reviewed the patients History and Physical, chart, labs and discussed the procedure including the risks, benefits and alternatives for the proposed anesthesia with the patient or authorized representative who has indicated his/her understanding and acceptance.     Dental advisory given  Plan Discussed with: CRNA  Anesthesia Plan Comments:          Anesthesia Quick Evaluation

## 2019-07-03 NOTE — Progress Notes (Signed)
PT Cancellation Note  Patient Details Name: Kyle Garcia MRN: [id number] DOB: [birth year]   Cancelled Treatment:    Reason Eval/Treat Not Completed: Patient not medically ready. Intubated. Femoral sheath in place.   [name] 07/03/2019, 8:34 AM   [name], PT  Office # [phone] Pager [phone]

## 2019-07-03 NOTE — Code Documentation (Addendum)
68 yo male code stroke with left hemiparesis, right forced gaze, and dysarthria.  LSW 2200 07/02/19. NIHSS 20. CT/CTA/CTP done. Right MCA occlusion confirmed on CTP by Dr. Lorraine Lax. No TPA due to patient on Xarelto due to right leg DVT. Pt reports last xarelto taken 07/02/19. Anesthesia to ED to intubate pt. Pt then taken to IR for possible mechanical thrombectomy.

## 2019-07-03 NOTE — H&P (Addendum)
Chief Complaint: Unable to move left side  History obtained from: Patient and Chart    HPI:                                                                                                                                       Kyle Garcia is a 68 y.o. male with past medical history of DVT on Xarelto, adenocarcinoma of the right lung, hyperlipidemia, prior strokes noted on MRI presents to the ED as a stroke alert for left-sided weakness.   Last known normal was 10 PM when patient went to bed he woke up around midnight and noticed that he was unable to move the left side of his body.  EMS was called and on arrival patient had forced right gaze deviation, left-sided neglect and plegia.  He was awake and oriented.  He had taken Xarelto at 5 PM last night.  Stat CT head was negative for hemorrhage.  CTA was suggestive for a right M2 M3 occlusion with CT perfusion showing a large perfusion mismatch (124 cc penumbra 30 cc core).  Patient was taken to IR for mechanical thrombectomy.  Date last known well: 07/02/2019 Time last known well: 10 PM tPA Given: No patient on anticoagulation NIHSS: 18 Baseline MRS 0   Past Medical History:  Diagnosis Date  . Cancer ([place])    stage 4 adenocarcinoma right lung  . GERD (gastroesophageal reflux disease)   . Hyperlipidemia   . Stroke [facility])    25-30 emboli seen on screening MRI    Past Surgical History:  Procedure Laterality Date  . CATARACT EXTRACTION  2016  . EYE SURGERY    . VIDEO BRONCHOSCOPY Bilateral 05/21/2019   Procedure: VIDEO BRONCHOSCOPY WITH FLUORO;  Surgeon: Collene Gobble, MD;  Location: [facility] ENDOSCOPY;  Service: Cardiopulmonary;  Laterality: Bilateral;    No family history on file. Social History:  reports that he quit smoking about 4 months ago. His smoking use included cigars. He has never used smokeless tobacco. He reports current alcohol use of about 4.0 - 7.0 standard drinks of alcohol per week. He reports that he does not use  drugs.  Allergies: No Known Allergies  Medications:                                                                                                                        I reviewed home medications  ROS:                                                                                                                                     14 systems reviewed and negative except above    Examination:                                                                                                      General: Appears well-developed . Psych: Affect appropriate to situation Eyes: No scleral injection HENT: No OP obstrucion Head: Normocephalic.  Cardiovascular: Normal rate and regular rhythm.  Is going has been a moderately busy night Respiratory: Effort normal and breath sounds normal to anterior ascultation GI: Soft.  No distension. There is no tenderness.  Skin: WDI    Neurological Examination Mental Status: Alert, oriented, thought content appropriate.  Speech fluent without evidence of aphasia.  Dysarthria Present able to follow 3 step commands without difficulty. Cranial Nerves: II: Visual fields left homonymous hemianopsia III,IV, VI: ptosis not present, forced gaze deviation to the right pupils equal, round, reactive to light and accommodation V,VII: Left facial droop, reduced sensation on the left side VIII: hearing normal bilaterally IX,X: uvula rises symmetrically XI: bilateral shoulder shrug XII: midline tongue extension Motor: Right : Upper extremity   5/5    Left:     Upper extremity   0/5  Lower extremity   5/5     Lower extremity   0/5 Tone and bulk:normal tone throughout; no atrophy noted Sensory: Unable to appreciate light touch of the left arm and leg Deep Tendon Reflexes:  symmetric throughout Plantars: Right: downgoing   Left: downgoing Cerebellar: Normal finger-to-nose on the right side Gait: normal gait and station     Lab Results: Basic Metabolic  Panel: Recent Labs  Lab 07/01/19 1059 07/03/19 0205 07/03/19 0213  NA 136 133* 133*  K 4.6 3.5 3.3*  CL 102 100 99  CO2 27 22  --   GLUCOSE 93 124* 128*  BUN 16 16 15   CREATININE 0.86 0.89 0.70  CALCIUM 8.4* 8.2*  --     CBC: Recent Labs  Lab 07/01/19 1059 07/03/19 0205 07/03/19 0213  WBC 4.6 6.1  --   NEUTROABS 3.1 4.3  --   HGB 10.3* 9.6* 9.9*  HCT 33.4* 30.5* 29.0*  MCV 85.6 83.8  --   PLT 215 256  --     Coagulation Studies: No results for input(s): LABPROT, INR in the last 72 hours.  Imaging:  Ct Code Stroke Cta Head W/wo Contrast  Result Date: 07/03/2019 CLINICAL DATA:  Code stroke. Initial evaluation for acute stroke, left-sided deficits. EXAM: CT ANGIOGRAPHY HEAD AND NECK TECHNIQUE: Multidetector CT imaging of the head and neck was performed using the standard protocol during bolus administration of intravenous contrast. Multiplanar CT image reconstructions and MIPs were obtained to evaluate the vascular anatomy. Carotid stenosis measurements (when applicable) are obtained utilizing NASCET criteria, using the distal internal carotid diameter as the denominator. CONTRAST:  68mL OMNIPAQUE IOHEXOL 350 MG/ML SOLN COMPARISON:  Prior MRI from 06/10/2019 FINDINGS: CT HEAD FINDINGS Brain: Examination technically limited by positioning and motion artifact. Age-related cerebral atrophy. No acute intracranial hemorrhage. No acute large vessel territory infarct. No mass lesion or midline shift. No hydrocephalus. No extra-axial fluid collection. Vascular: No hyperdense vessel. Skull: Scalp soft tissues and calvarium within normal limits. Sinuses: Paranasal sinuses are largely clear.  No mastoid effusion. Orbits: Globes and orbital soft tissues demonstrate no acute finding. Right gaze noted. Review of the MIP images confirms the above findings CTA NECK FINDINGS Aortic arch: Mild atheromatous plaque about the visualized aortic arch. Origin of the great vessels not assessed on this exam.  Visualized subclavian arteries widely patent. Right carotid system: Right common and internal carotid arteries widely patent without stenosis, dissection, or occlusion. Left carotid system: Visualized left common carotid artery widely patent. No significant atheromatous narrowing about the left bifurcation. Left ICA widely patent to the skull base without stenosis, dissection, or occlusion. Vertebral arteries: Both vertebral arteries arise from the subclavian arteries. Right vertebral artery dominant. Vertebral arteries widely patent within the neck without stenosis, dissection, or occlusion. Skeleton: No acute osseous finding. Scattered predominantly subcentimeter sclerotic foci within the visualized spine, nonspecific. These did not appear to be PET avid on prior PET-CT from 05/06/2019. Other neck: No other acute soft tissue abnormality within the neck. Upper chest: Visualized upper chest demonstrates no acute finding. Review of the MIP images confirms the above findings CTA HEAD FINDINGS Anterior circulation: Evaluation of the intracranial circulation mildly limited by timing of the contrast bolus, with suboptimal opacification of the intracranial arterial vasculature. Internal carotid arteries widely patent to the termini without stenosis. ICA termini well perfused. A1 segments patent bilaterally. Left A1 hypoplastic, accounting for the slightly diminutive left ICA is compared to the right. Normal anterior communicating artery. Anterior cerebral arteries widely patent to their distal aspects. Right M1 and right MCA bifurcation patent. Although difficult to be certain, there is question of a proximal right M2 or possibly M3 branch out, best seen on sagittal sequence (series 9, image 53). Remainder of the right MCA branches appears to be patent. Left M1 and left MCA bifurcation patent. Distal left MCA branches well perfused. Posterior circulation: Dominant right vertebral artery widely patent to the  vertebrobasilar junction. Right PICA grossly patent proximally. Hypoplastic left vertebral artery terminates in PICA. Left PICA patent. Basilar widely patent to its distal aspect. Superior cerebellar arteries patent bilaterally. PCAs both widely patent to their distal aspects. Venous sinuses: Grossly patent allowing for timing of the contrast bolus. Anatomic variants: Hypoplastic left vertebral artery terminating in PICA. Delayed phase: Not performed. Review of the MIP images confirms the above findings IMPRESSION: 1. Positive CTA for emergent large vessel occlusion, with proximal right M2/M3 occlusion. 2. Otherwise wide patency of the major arterial vasculature of the head and neck. These results were communicated to Dr. Lorraine Lax At 2:28 amon 7/10/2020by text page via the [facility] messaging system. Results again discussed with Dr. Lorraine Lax  at 2:50 a.m. Electronically Signed   By: Jeannine Boga M.D.   On: 07/03/2019 02:54   Ct Code Stroke Cta Neck W/wo Contrast  Result Date: 07/03/2019 CLINICAL DATA:  Code stroke. Initial evaluation for acute stroke, left-sided deficits. EXAM: CT ANGIOGRAPHY HEAD AND NECK TECHNIQUE: Multidetector CT imaging of the head and neck was performed using the standard protocol during bolus administration of intravenous contrast. Multiplanar CT image reconstructions and MIPs were obtained to evaluate the vascular anatomy. Carotid stenosis measurements (when applicable) are obtained utilizing NASCET criteria, using the distal internal carotid diameter as the denominator. CONTRAST:  79mL OMNIPAQUE IOHEXOL 350 MG/ML SOLN COMPARISON:  Prior MRI from 06/10/2019 FINDINGS: CT HEAD FINDINGS Brain: Examination technically limited by positioning and motion artifact. Age-related cerebral atrophy. No acute intracranial hemorrhage. No acute large vessel territory infarct. No mass lesion or midline shift. No hydrocephalus. No extra-axial fluid collection. Vascular: No hyperdense vessel. Skull: Scalp  soft tissues and calvarium within normal limits. Sinuses: Paranasal sinuses are largely clear.  No mastoid effusion. Orbits: Globes and orbital soft tissues demonstrate no acute finding. Right gaze noted. Review of the MIP images confirms the above findings CTA NECK FINDINGS Aortic arch: Mild atheromatous plaque about the visualized aortic arch. Origin of the great vessels not assessed on this exam. Visualized subclavian arteries widely patent. Right carotid system: Right common and internal carotid arteries widely patent without stenosis, dissection, or occlusion. Left carotid system: Visualized left common carotid artery widely patent. No significant atheromatous narrowing about the left bifurcation. Left ICA widely patent to the skull base without stenosis, dissection, or occlusion. Vertebral arteries: Both vertebral arteries arise from the subclavian arteries. Right vertebral artery dominant. Vertebral arteries widely patent within the neck without stenosis, dissection, or occlusion. Skeleton: No acute osseous finding. Scattered predominantly subcentimeter sclerotic foci within the visualized spine, nonspecific. These did not appear to be PET avid on prior PET-CT from 05/06/2019. Other neck: No other acute soft tissue abnormality within the neck. Upper chest: Visualized upper chest demonstrates no acute finding. Review of the MIP images confirms the above findings CTA HEAD FINDINGS Anterior circulation: Evaluation of the intracranial circulation mildly limited by timing of the contrast bolus, with suboptimal opacification of the intracranial arterial vasculature. Internal carotid arteries widely patent to the termini without stenosis. ICA termini well perfused. A1 segments patent bilaterally. Left A1 hypoplastic, accounting for the slightly diminutive left ICA is compared to the right. Normal anterior communicating artery. Anterior cerebral arteries widely patent to their distal aspects. Right M1 and right MCA  bifurcation patent. Although difficult to be certain, there is question of a proximal right M2 or possibly M3 branch out, best seen on sagittal sequence (series 9, image 53). Remainder of the right MCA branches appears to be patent. Left M1 and left MCA bifurcation patent. Distal left MCA branches well perfused. Posterior circulation: Dominant right vertebral artery widely patent to the vertebrobasilar junction. Right PICA grossly patent proximally. Hypoplastic left vertebral artery terminates in PICA. Left PICA patent. Basilar widely patent to its distal aspect. Superior cerebellar arteries patent bilaterally. PCAs both widely patent to their distal aspects. Venous sinuses: Grossly patent allowing for timing of the contrast bolus. Anatomic variants: Hypoplastic left vertebral artery terminating in PICA. Delayed phase: Not performed. Review of the MIP images confirms the above findings IMPRESSION: 1. Positive CTA for emergent large vessel occlusion, with proximal right M2/M3 occlusion. 2. Otherwise wide patency of the major arterial vasculature of the head and neck. These results were communicated to  Dr. Lorraine Lax At 2:28 amon 7/10/2020by text page via the [facility] messaging system. Results again discussed with Dr. Lorraine Lax at 2:50 a.m. Electronically Signed   By: Jeannine Boga M.D.   On: 07/03/2019 02:54   Ct Cerebral Perfusion W Contrast  Result Date: 07/03/2019 CLINICAL DATA:  Initial evaluation for acute stroke, left-sided deficits. EXAM: CT PERFUSION BRAIN TECHNIQUE: Multiphase CT imaging of the brain was performed following IV bolus contrast injection. Subsequent parametric perfusion maps were calculated using RAPID software. CONTRAST:  24mL OMNIPAQUE IOHEXOL 350 MG/ML SOLN COMPARISON:  Prior CT and CTA from earlier same day. FINDINGS: CT Brain Perfusion Findings: CBF (<30%) Volume: 71mL Perfusion (Tmax>6.0s) volume: 135mL Mismatch Volume: 49mL ASPECTS on noncontrast CT Head: 10 at time today. Infarct Core:  33 mL Infarction Location:Acute core infarct involving the mid and posterior right centrum semi ovale as well as the overlying posterior right frontal and right parietal cortex. IMPRESSION: Positive CT perfusion with acute core infarct involving the mid and posterior right MCA territory, estimated volume 33 cc. Moderate-sized surrounding penumbra. Electronically Signed   By: Jeannine Boga M.D.   On: 07/03/2019 02:56   Ct Head Code Stroke Wo Contrast  Result Date: 07/03/2019 CLINICAL DATA:  Code stroke. Initial evaluation for acute stroke, left-sided deficits. EXAM: CT ANGIOGRAPHY HEAD AND NECK TECHNIQUE: Multidetector CT imaging of the head and neck was performed using the standard protocol during bolus administration of intravenous contrast. Multiplanar CT image reconstructions and MIPs were obtained to evaluate the vascular anatomy. Carotid stenosis measurements (when applicable) are obtained utilizing NASCET criteria, using the distal internal carotid diameter as the denominator. CONTRAST:  11mL OMNIPAQUE IOHEXOL 350 MG/ML SOLN COMPARISON:  Prior MRI from 06/10/2019 FINDINGS: CT HEAD FINDINGS Brain: Examination technically limited by positioning and motion artifact. Age-related cerebral atrophy. No acute intracranial hemorrhage. No acute large vessel territory infarct. No mass lesion or midline shift. No hydrocephalus. No extra-axial fluid collection. Vascular: No hyperdense vessel. Skull: Scalp soft tissues and calvarium within normal limits. Sinuses: Paranasal sinuses are largely clear.  No mastoid effusion. Orbits: Globes and orbital soft tissues demonstrate no acute finding. Right gaze noted. Review of the MIP images confirms the above findings CTA NECK FINDINGS Aortic arch: Mild atheromatous plaque about the visualized aortic arch. Origin of the great vessels not assessed on this exam. Visualized subclavian arteries widely patent. Right carotid system: Right common and internal carotid arteries  widely patent without stenosis, dissection, or occlusion. Left carotid system: Visualized left common carotid artery widely patent. No significant atheromatous narrowing about the left bifurcation. Left ICA widely patent to the skull base without stenosis, dissection, or occlusion. Vertebral arteries: Both vertebral arteries arise from the subclavian arteries. Right vertebral artery dominant. Vertebral arteries widely patent within the neck without stenosis, dissection, or occlusion. Skeleton: No acute osseous finding. Scattered predominantly subcentimeter sclerotic foci within the visualized spine, nonspecific. These did not appear to be PET avid on prior PET-CT from 05/06/2019. Other neck: No other acute soft tissue abnormality within the neck. Upper chest: Visualized upper chest demonstrates no acute finding. Review of the MIP images confirms the above findings CTA HEAD FINDINGS Anterior circulation: Evaluation of the intracranial circulation mildly limited by timing of the contrast bolus, with suboptimal opacification of the intracranial arterial vasculature. Internal carotid arteries widely patent to the termini without stenosis. ICA termini well perfused. A1 segments patent bilaterally. Left A1 hypoplastic, accounting for the slightly diminutive left ICA is compared to the right. Normal anterior communicating artery. Anterior cerebral arteries widely  patent to their distal aspects. Right M1 and right MCA bifurcation patent. Although difficult to be certain, there is question of a proximal right M2 or possibly M3 branch out, best seen on sagittal sequence (series 9, image 53). Remainder of the right MCA branches appears to be patent. Left M1 and left MCA bifurcation patent. Distal left MCA branches well perfused. Posterior circulation: Dominant right vertebral artery widely patent to the vertebrobasilar junction. Right PICA grossly patent proximally. Hypoplastic left vertebral artery terminates in PICA. Left  PICA patent. Basilar widely patent to its distal aspect. Superior cerebellar arteries patent bilaterally. PCAs both widely patent to their distal aspects. Venous sinuses: Grossly patent allowing for timing of the contrast bolus. Anatomic variants: Hypoplastic left vertebral artery terminating in PICA. Delayed phase: Not performed. Review of the MIP images confirms the above findings IMPRESSION: 1. Positive CTA for emergent large vessel occlusion, with proximal right M2/M3 occlusion. 2. Otherwise wide patency of the major arterial vasculature of the head and neck. These results were communicated to Dr. Lorraine Lax At 2:28 amon 7/10/2020by text page via the [facility] messaging system. Results again discussed with Dr. Lorraine Lax at 2:50 a.m. Electronically Signed   By: Jeannine Boga M.D.   On: 07/03/2019 02:54     ASSESSMENT AND PLAN  68 y.o. male with past medical history of DVT on Xarelto, adenocarcinoma of the right lung, hyperlipidemia, prior stroke presents with left hemiplegia and neglect with forced right gaze deviation.  Did not receive IV TPA as patient on Xarelto.  CT angiogram showed a left M2 occlusion and patient was taken to IR after discussing with wife risk versus benefit of mechanical thrombectomy.  CT perfusion favorable/  It appears that revascularization was difficult requiring multiple passes to revascularize occluded MCA.  Patient also had occluded ACA A2-A3 region requiring 1 pass.  Patient remains intubated  in the neuro ICU.  Plan  Right MCA infarction due to right M2 occlusion as well as ACA infarction from a A2 A3 occlusion status post mechanical thrombectomy with taking 3 recanalization  # MRI of the brain without contrast #Transthoracic Echo  #Start or continue Atorvastatin 40 mg/other high intensity statin after passing swallow # BP goal: Per neuro IR 1 29-5 40 systolic  # HBAIC and Lipid profile # Telemetry monitoring # Frequent neuro checks # stroke swallow  screen #appreciate CCM for ventilator management   Right leg DVT -Hold Xarelto for now, can resume after obtaining MRI brain to see if patient has large stroke/hemorrhagic conversion  Other medical problems Lung cancer: on chemotherapy Hyperlipidemia: Continue statin   CODE STATUS full code for now DVT prophylaxis: SCD  Please page stroke NP  Or  PA  Or MD from 8am -4 pm  as this patient from this time will be  followed by the stroke.   You can look them up on [email or web address]  Password [email or web address]   This patient is neurologically critically ill due to right MCA stroke requiring mechanical thrombectomy.   He is at risk for significant risk of neurological worsening from cerebral edema,  death from brain herniation, heart failure, hemorrhagic conversion, infection, respiratory failure and seizure. This patient's care requires constant monitoring of vital signs, hemodynamics, respiratory and cardiac monitoring, review of multiple databases, neurological assessment, discussion with family, other specialists and medical decision making of high complexity.  I spent 65 minutes of neurocritical time in the care of this patient.    Antwon Rochin [facility] Pager Number [phone]

## 2019-07-03 NOTE — Sedation Documentation (Signed)
Spoke with Kane in pt placement. Bed assignment pending.

## 2019-07-03 NOTE — Transfer of Care (Signed)
Immediate Anesthesia Transfer of Care Note  Patient: Kyle Garcia  Procedure(s) Performed: IR WITH ANESTHESIA (N/A )  Patient Location: ICU  Anesthesia Type:General  Level of Consciousness: sedated and Patient remains intubated per anesthesia plan  Airway & Oxygen Therapy: Patient remains intubated per anesthesia plan and Patient placed on Ventilator (see vital sign flow sheet for setting)  Post-op Assessment: Report given to RN and Post -op Vital signs reviewed and stable  Post vital signs: Reviewed and stable  Last Vitals:  Vitals Value Taken Time  BP 121/77 07/03/19 0651  Temp 35.6 C 07/03/19 0658  Pulse 96 07/03/19 0658  Resp 23 07/03/19 0658  SpO2 100 % 07/03/19 0658  Vitals shown include unvalidated device data.  Last Pain:  Vitals:   07/03/19 0210  TempSrc: Oral  PainSc: 0-No pain         Complications: No apparent anesthesia complications

## 2019-07-03 NOTE — Progress Notes (Signed)
Patient ID: Kyle Garcia, male   DOB: 04-Apr-1951, 68 y.o.   MRN: [id number] INR. Post procedure  CT brain  Reveals slight subarachnoid hyperdensity in the  RT perisylvian region and  Of a mod degree in the anterior interhemispheric fissure. No gross mass effect. RT groin 8 F sheath left in place because of patient being on xarelto.  Patient left intubated because of the immediate post procedural CT findings. Distal pulses dopplerable DPs and PTs bilaterally. S.Maddilynn Esperanza MD

## 2019-07-03 NOTE — Sedation Documentation (Signed)
Groin and pulses assessed with admitting nurse, Jinny Blossom. Unchanged, see flowsheet.

## 2019-07-03 NOTE — Progress Notes (Signed)
SLP Cancellation Note  Patient Details Name: Kyle Garcia MRN: [id number] DOB: 1951/07/05   Cancelled treatment:       Reason Eval/Treat Not Completed: Patient not medically ready, will f/u for cognitive linguistic eval.    Abriella Filkins, Katherene Ponto 07/03/2019, 7:26 AM

## 2019-07-03 NOTE — Progress Notes (Signed)
Received pt already in MRI, Pt transported back to 4n via vent w/ no apparent complications.

## 2019-07-03 NOTE — ED Provider Notes (Signed)
[facility]ERGENCY DEPARTMENT Provider Note   CSN: [id number] Arrival date & time: 07/03/19  0158     History   Chief Complaint No chief complaint on file.   HPI Kyle Garcia is a 68 y.o. male.     Level 5 caveat for acuity of condition.  Patient with lung cancer, DVT on Xarelto.  Presenting from home with left-sided weakness and without neglect.  Last seen normal was 10 PM.  He woke up around midnight with left-sided weakness and facial droop which is aggressively worsening per EMS as well as left-sided neglect.  He denies any falls or recent trauma.  Denies any headache, chest pain, shortness of breath, abdominal pain, nausea or vomiting.  Recent admission for multifocal pneumonia with negative coronavirus.  The history is provided by the patient and the EMS personnel.    Past Medical History:  Diagnosis Date   Cancer [facility])    stage 4 adenocarcinoma right lung   GERD (gastroesophageal reflux disease)    Hyperlipidemia    Stroke ([place])    25-30 emboli seen on screening MRI    Patient Active Problem List   Diagnosis Date Noted   CAP (community acquired pneumonia) 06/20/2019   Stroke [facility])    Stroke due to embolism ([place]) 06/17/2019   Encounter for antineoplastic chemotherapy 06/05/2019   Encounter for antineoplastic immunotherapy 06/05/2019   Goals of care, counseling/discussion 06/05/2019   Adenocarcinoma of right lung, stage 4 ([place]) 06/04/2019   Cough 05/21/2019   S/P bronchoscopy with biopsy    DVT (deep venous thrombosis) ([place]) 05/08/2019   Weight loss 04/28/2019   Multifocal pneumonia 04/10/2019   GERD without esophagitis 11/14/2015   Hyperlipidemia 11/14/2015   Benign prostatic hyperplasia 12/24/2008    Past Surgical History:  Procedure Laterality Date   CATARACT EXTRACTION  2016   EYE SURGERY     VIDEO BRONCHOSCOPY Bilateral 05/21/2019   Procedure: VIDEO BRONCHOSCOPY WITH FLUORO;  Surgeon: Collene Gobble, MD;   Location: [facility]COPY;  Service: Cardiopulmonary;  Laterality: Bilateral;        Home Medications    Prior to Admission medications   Medication Sig Start Date End Date Taking? Authorizing Provider  acetaminophen (TYLENOL) 500 MG tablet Take 1,000 mg by mouth every 6 (six) hours as needed for headache.    [provider]  azithromycin (ZITHROMAX) 500 MG tablet Take 1 tablet (500 mg total) by mouth daily. 06/22/19   Annita Brod, MD  cefpodoxime (VANTIN) 200 MG tablet Take 1 tablet (200 mg total) by mouth 2 (two) times daily. 06/22/19   Annita Brod, MD  folic acid (FOLVITE) 1 MG tablet Take 1 tablet (1 mg total) by mouth daily. 06/08/19   Curt Bears, MD  HYDROcodone-homatropine [facility]) 5-1.5 MG/5ML syrup Take 5 mLs by mouth every 6 (six) hours as needed for cough. 06/24/19   Curt Bears, MD  Multiple Vitamins-Minerals (MULTIVITAMIN WITH MINERALS) tablet Take 1 tablet by mouth daily.  05/19/10   [provider]  Multiple Vitamins-Minerals (PRESERVISION AREDS 2) CAPS Take 1 capsule by mouth 2 (two) times daily.    [provider]  omeprazole (PRILOSEC) 40 MG capsule Take 1 capsule (40 mg total) by mouth daily. 12/30/18   Susy Frizzle, MD  Polyvinyl Alcohol-Povidone (REFRESH OP) Place 1 drop into both eyes every evening.    [provider]  pravastatin (PRAVACHOL) 20 MG tablet TAKE 1 TABLET DAILY AT 6 PM Patient taking differently: Take 20 mg by mouth  daily at 6 PM.  02/18/19   Susy Frizzle, MD  prochlorperazine (COMPAZINE) 10 MG tablet Take 1 tablet (10 mg total) by mouth every 6 (six) hours as needed for nausea or vomiting. 06/08/19   Curt Bears, MD  Rivaroxaban 15 & 20 MG TBPK Take as directed on package: Start with one 15mg  tablet by mouth twice a day with food. On Day 22, switch to one 20mg  tablet once a day with food. 06/17/19   Heilingoetter, Cassandra L, PA-C    Family History No family history on file.  Social  History Social History   Tobacco Use   Smoking status: Former Smoker    Types: Cigars    Quit date: 02/07/2019    Years since quitting: 0.4   Smokeless tobacco: Never Used   Tobacco comment: occassional cigar/stopped ciggs in 1981; as of 04/28/2019, rarely smokes  Substance Use Topics   Alcohol use: Yes    Alcohol/week: 4.0 - 7.0 standard drinks    Types: 1 - 3 Glasses of wine, 3 - 4 Cans of beer per week   Drug use: Never     Allergies   Patient has no known allergies.   Review of Systems Review of Systems  Unable to perform ROS: Acuity of condition     Physical Exam Updated Vital Signs There were no vitals taken for this visit.  Physical Exam Vitals signs and nursing note reviewed.  Constitutional:      General: He is not in acute distress.    Appearance: He is well-developed. He is ill-appearing.  HENT:     Head: Normocephalic and atraumatic.     Mouth/Throat:     Pharynx: No oropharyngeal exudate.  Eyes:     Conjunctiva/sclera: Conjunctivae normal.     Pupils: Pupils are equal, round, and reactive to light.  Neck:     Musculoskeletal: Normal range of motion and neck supple.     Comments: No meningismus. Cardiovascular:     Rate and Rhythm: Regular rhythm. Tachycardia present.     Heart sounds: Normal heart sounds. No murmur.     Comments: Tachycardic 120-130s Pulmonary:     Effort: Pulmonary effort is normal. No respiratory distress.     Breath sounds: Normal breath sounds.  Abdominal:     Palpations: Abdomen is soft.     Tenderness: There is no abdominal tenderness. There is no guarding or rebound.  Musculoskeletal: Normal range of motion.        General: Swelling present. No tenderness.     Right lower leg: Edema present.  Skin:    General: Skin is warm.  Neurological:     Mental Status: He is alert and oriented to person, place, and time.     Cranial Nerves: Cranial nerve deficit present.     Motor: No abnormal muscle tone.      Coordination: Coordination abnormal.     Comments: Left-sided facial droop, left-sided neglect.  Left arm and leg are flaccid and unable to he left the bed.  Able to follow commands on the right and hold arm and leg off the bed.  Gaze unable to cross midline.  Psychiatric:        Behavior: Behavior normal.      ED Treatments / Results  Labs (all labs ordered are listed, but only abnormal results are displayed) Labs Reviewed  PROTIME-INR - Abnormal; Notable for the following components:      Result Value   Prothrombin Time 35.1 (*)  INR 3.6 (*)    All other components within normal limits  APTT - Abnormal; Notable for the following components:   aPTT 51 (*)    All other components within normal limits  CBC - Abnormal; Notable for the following components:   RBC 3.64 (*)    Hemoglobin 9.6 (*)    HCT 30.5 (*)    All other components within normal limits  COMPREHENSIVE METABOLIC PANEL - Abnormal; Notable for the following components:   Sodium 133 (*)    Glucose, Bld 124 (*)    Calcium 8.2 (*)    Total Protein 5.5 (*)    Albumin 2.7 (*)    All other components within normal limits  RAPID URINE DRUG SCREEN, HOSP PERFORMED - Abnormal; Notable for the following components:   Opiates POSITIVE (*)    All other components within normal limits  URINALYSIS, ROUTINE W REFLEX MICROSCOPIC - Abnormal; Notable for the following components:   Specific Gravity, Urine >1.046 (*)    Ketones, ur 20 (*)    All other components within normal limits  I-STAT CHEM 8, ED - Abnormal; Notable for the following components:   Sodium 133 (*)    Potassium 3.3 (*)    Glucose, Bld 128 (*)    Calcium, Ion 1.09 (*)    Hemoglobin 9.9 (*)    HCT 29.0 (*)    All other components within normal limits  CBG MONITORING, ED - Abnormal; Notable for the following components:   Glucose-Capillary 116 (*)    All other components within normal limits  POCT I-STAT 7, (LYTES, BLD GAS, ICA,H+H) - Abnormal; Notable for  the following components:   pO2, Arterial 224.0 (*)    HCT 22.0 (*)    Hemoglobin 7.5 (*)    All other components within normal limits  SARS CORONAVIRUS 2 (HOSPITAL ORDER, [place] [facility])  MRSA PCR SCREENING  ETHANOL  DIFFERENTIAL  URINALYSIS, ROUTINE W REFLEX MICROSCOPIC  HEMOGLOBIN A1C  LIPID PANEL  TRIGLYCERIDES  I-STAT CHEM 8, ED    EKG EKG Interpretation  Date/Time:  Friday July 03 2019 02:39:06 EDT Ventricular Rate:  136 PR Interval:    QRS Duration: 83 QT Interval:  299 QTC Calculation: 450 R Axis:   79 Text Interpretation:  Sinus tachycardia Ventricular premature complex Aberrant conduction of SV complex(es) Borderline low voltage, extremity leads Rate faster Confirmed by Ezequiel Essex [phone]) on 07/03/2019 2:47:02 AM   Radiology Ct Code Stroke Cta Head W/wo Contrast  Result Date: 07/03/2019 CLINICAL DATA:  Code stroke. Initial evaluation for acute stroke, left-sided deficits. EXAM: CT ANGIOGRAPHY HEAD AND NECK TECHNIQUE: Multidetector CT imaging of the head and neck was performed using the standard protocol during bolus administration of intravenous contrast. Multiplanar CT image reconstructions and MIPs were obtained to evaluate the vascular anatomy. Carotid stenosis measurements (when applicable) are obtained utilizing NASCET criteria, using the distal internal carotid diameter as the denominator. CONTRAST:  73mL OMNIPAQUE IOHEXOL 350 MG/ML SOLN COMPARISON:  Prior MRI from 06/10/2019 FINDINGS: CT HEAD FINDINGS Brain: Examination technically limited by positioning and motion artifact. Age-related cerebral atrophy. No acute intracranial hemorrhage. No acute large vessel territory infarct. No mass lesion or midline shift. No hydrocephalus. No extra-axial fluid collection. Vascular: No hyperdense vessel. Skull: Scalp soft tissues and calvarium within normal limits. Sinuses: Paranasal sinuses are largely clear.  No mastoid effusion. Orbits: Globes and  orbital soft tissues demonstrate no acute finding. Right gaze noted. Review of the MIP images confirms the above  findings CTA NECK FINDINGS Aortic arch: Mild atheromatous plaque about the visualized aortic arch. Origin of the great vessels not assessed on this exam. Visualized subclavian arteries widely patent. Right carotid system: Right common and internal carotid arteries widely patent without stenosis, dissection, or occlusion. Left carotid system: Visualized left common carotid artery widely patent. No significant atheromatous narrowing about the left bifurcation. Left ICA widely patent to the skull base without stenosis, dissection, or occlusion. Vertebral arteries: Both vertebral arteries arise from the subclavian arteries. Right vertebral artery dominant. Vertebral arteries widely patent within the neck without stenosis, dissection, or occlusion. Skeleton: No acute osseous finding. Scattered predominantly subcentimeter sclerotic foci within the visualized spine, nonspecific. These did not appear to be PET avid on prior PET-CT from 05/06/2019. Other neck: No other acute soft tissue abnormality within the neck. Upper chest: Visualized upper chest demonstrates no acute finding. Review of the MIP images confirms the above findings CTA HEAD FINDINGS Anterior circulation: Evaluation of the intracranial circulation mildly limited by timing of the contrast bolus, with suboptimal opacification of the intracranial arterial vasculature. Internal carotid arteries widely patent to the termini without stenosis. ICA termini well perfused. A1 segments patent bilaterally. Left A1 hypoplastic, accounting for the slightly diminutive left ICA is compared to the right. Normal anterior communicating artery. Anterior cerebral arteries widely patent to their distal aspects. Right M1 and right MCA bifurcation patent. Although difficult to be certain, there is question of a proximal right M2 or possibly M3 branch out, best seen on  sagittal sequence (series 9, image 53). Remainder of the right MCA branches appears to be patent. Left M1 and left MCA bifurcation patent. Distal left MCA branches well perfused. Posterior circulation: Dominant right vertebral artery widely patent to the vertebrobasilar junction. Right PICA grossly patent proximally. Hypoplastic left vertebral artery terminates in PICA. Left PICA patent. Basilar widely patent to its distal aspect. Superior cerebellar arteries patent bilaterally. PCAs both widely patent to their distal aspects. Venous sinuses: Grossly patent allowing for timing of the contrast bolus. Anatomic variants: Hypoplastic left vertebral artery terminating in PICA. Delayed phase: Not performed. Review of the MIP images confirms the above findings IMPRESSION: 1. Positive CTA for emergent large vessel occlusion, with proximal right M2/M3 occlusion. 2. Otherwise wide patency of the major arterial vasculature of the head and neck. These results were communicated to Dr. Lorraine Lax At 2:28 amon 7/10/2020by text page via the [facility] messaging system. Results again discussed with Dr. Lorraine Lax at 2:50 a.m. Electronically Signed   By: Jeannine Boga M.D.   On: 07/03/2019 02:54   Ct Code Stroke Cta Neck W/wo Contrast  Result Date: 07/03/2019 CLINICAL DATA:  Code stroke. Initial evaluation for acute stroke, left-sided deficits. EXAM: CT ANGIOGRAPHY HEAD AND NECK TECHNIQUE: Multidetector CT imaging of the head and neck was performed using the standard protocol during bolus administration of intravenous contrast. Multiplanar CT image reconstructions and MIPs were obtained to evaluate the vascular anatomy. Carotid stenosis measurements (when applicable) are obtained utilizing NASCET criteria, using the distal internal carotid diameter as the denominator. CONTRAST:  68mL OMNIPAQUE IOHEXOL 350 MG/ML SOLN COMPARISON:  Prior MRI from 06/10/2019 FINDINGS: CT HEAD FINDINGS Brain: Examination technically limited by positioning and  motion artifact. Age-related cerebral atrophy. No acute intracranial hemorrhage. No acute large vessel territory infarct. No mass lesion or midline shift. No hydrocephalus. No extra-axial fluid collection. Vascular: No hyperdense vessel. Skull: Scalp soft tissues and calvarium within normal limits. Sinuses: Paranasal sinuses are largely clear.  No mastoid effusion. Orbits: Globes  and orbital soft tissues demonstrate no acute finding. Right gaze noted. Review of the MIP images confirms the above findings CTA NECK FINDINGS Aortic arch: Mild atheromatous plaque about the visualized aortic arch. Origin of the great vessels not assessed on this exam. Visualized subclavian arteries widely patent. Right carotid system: Right common and internal carotid arteries widely patent without stenosis, dissection, or occlusion. Left carotid system: Visualized left common carotid artery widely patent. No significant atheromatous narrowing about the left bifurcation. Left ICA widely patent to the skull base without stenosis, dissection, or occlusion. Vertebral arteries: Both vertebral arteries arise from the subclavian arteries. Right vertebral artery dominant. Vertebral arteries widely patent within the neck without stenosis, dissection, or occlusion. Skeleton: No acute osseous finding. Scattered predominantly subcentimeter sclerotic foci within the visualized spine, nonspecific. These did not appear to be PET avid on prior PET-CT from 05/06/2019. Other neck: No other acute soft tissue abnormality within the neck. Upper chest: Visualized upper chest demonstrates no acute finding. Review of the MIP images confirms the above findings CTA HEAD FINDINGS Anterior circulation: Evaluation of the intracranial circulation mildly limited by timing of the contrast bolus, with suboptimal opacification of the intracranial arterial vasculature. Internal carotid arteries widely patent to the termini without stenosis. ICA termini well perfused. A1  segments patent bilaterally. Left A1 hypoplastic, accounting for the slightly diminutive left ICA is compared to the right. Normal anterior communicating artery. Anterior cerebral arteries widely patent to their distal aspects. Right M1 and right MCA bifurcation patent. Although difficult to be certain, there is question of a proximal right M2 or possibly M3 branch out, best seen on sagittal sequence (series 9, image 53). Remainder of the right MCA branches appears to be patent. Left M1 and left MCA bifurcation patent. Distal left MCA branches well perfused. Posterior circulation: Dominant right vertebral artery widely patent to the vertebrobasilar junction. Right PICA grossly patent proximally. Hypoplastic left vertebral artery terminates in PICA. Left PICA patent. Basilar widely patent to its distal aspect. Superior cerebellar arteries patent bilaterally. PCAs both widely patent to their distal aspects. Venous sinuses: Grossly patent allowing for timing of the contrast bolus. Anatomic variants: Hypoplastic left vertebral artery terminating in PICA. Delayed phase: Not performed. Review of the MIP images confirms the above findings IMPRESSION: 1. Positive CTA for emergent large vessel occlusion, with proximal right M2/M3 occlusion. 2. Otherwise wide patency of the major arterial vasculature of the head and neck. These results were communicated to Dr. Lorraine Lax At 2:28 amon 7/10/2020by text page via the [facility] messaging system. Results again discussed with Dr. Lorraine Lax at 2:50 a.m. Electronically Signed   By: Jeannine Boga M.D.   On: 07/03/2019 02:54   Ct Cerebral Perfusion W Contrast  Result Date: 07/03/2019 CLINICAL DATA:  Initial evaluation for acute stroke, left-sided deficits. EXAM: CT PERFUSION BRAIN TECHNIQUE: Multiphase CT imaging of the brain was performed following IV bolus contrast injection. Subsequent parametric perfusion maps were calculated using RAPID software. CONTRAST:  40mL OMNIPAQUE IOHEXOL 350  MG/ML SOLN COMPARISON:  Prior CT and CTA from earlier same day. FINDINGS: CT Brain Perfusion Findings: CBF (<30%) Volume: 59mL Perfusion (Tmax>6.0s) volume: 12mL Mismatch Volume: 73mL ASPECTS on noncontrast CT Head: 10 at time today. Infarct Core: 33 mL Infarction Location:Acute core infarct involving the mid and posterior right centrum semi ovale as well as the overlying posterior right frontal and right parietal cortex. IMPRESSION: Positive CT perfusion with acute core infarct involving the mid and posterior right MCA territory, estimated volume 33 cc. Moderate-sized surrounding  penumbra. Electronically Signed   By: Jeannine Boga M.D.   On: 07/03/2019 02:56   Dg Chest Portable 1 View  Result Date: 07/03/2019 CLINICAL DATA:  Intubation.  Stroke.  History of lung cancer. EXAM: PORTABLE CHEST 1 VIEW COMPARISON:  CT 06/20/2019.  Chest x-ray 05/21/2019. FINDINGS: Endotracheal tube tip noted 5 cm above the carina. Heart size normal. Prominent right mid lung field infiltrate again noted without interim change. No pleural effusion or pneumothorax. No acute bony abnormality. IMPRESSION: 1.  Endotracheal tube noted with its tip 5 cm above the carina. 2. Prominent right mid lung field infiltrate again noted without interim change. Electronically Signed   By: Marcello Moores  Register   On: 07/03/2019 07:41   Ct Head Code Stroke Wo Contrast  Result Date: 07/03/2019 CLINICAL DATA:  Code stroke. Initial evaluation for acute stroke, left-sided deficits. EXAM: CT ANGIOGRAPHY HEAD AND NECK TECHNIQUE: Multidetector CT imaging of the head and neck was performed using the standard protocol during bolus administration of intravenous contrast. Multiplanar CT image reconstructions and MIPs were obtained to evaluate the vascular anatomy. Carotid stenosis measurements (when applicable) are obtained utilizing NASCET criteria, using the distal internal carotid diameter as the denominator. CONTRAST:  80mL OMNIPAQUE IOHEXOL 350 MG/ML  SOLN COMPARISON:  Prior MRI from 06/10/2019 FINDINGS: CT HEAD FINDINGS Brain: Examination technically limited by positioning and motion artifact. Age-related cerebral atrophy. No acute intracranial hemorrhage. No acute large vessel territory infarct. No mass lesion or midline shift. No hydrocephalus. No extra-axial fluid collection. Vascular: No hyperdense vessel. Skull: Scalp soft tissues and calvarium within normal limits. Sinuses: Paranasal sinuses are largely clear.  No mastoid effusion. Orbits: Globes and orbital soft tissues demonstrate no acute finding. Right gaze noted. Review of the MIP images confirms the above findings CTA NECK FINDINGS Aortic arch: Mild atheromatous plaque about the visualized aortic arch. Origin of the great vessels not assessed on this exam. Visualized subclavian arteries widely patent. Right carotid system: Right common and internal carotid arteries widely patent without stenosis, dissection, or occlusion. Left carotid system: Visualized left common carotid artery widely patent. No significant atheromatous narrowing about the left bifurcation. Left ICA widely patent to the skull base without stenosis, dissection, or occlusion. Vertebral arteries: Both vertebral arteries arise from the subclavian arteries. Right vertebral artery dominant. Vertebral arteries widely patent within the neck without stenosis, dissection, or occlusion. Skeleton: No acute osseous finding. Scattered predominantly subcentimeter sclerotic foci within the visualized spine, nonspecific. These did not appear to be PET avid on prior PET-CT from 05/06/2019. Other neck: No other acute soft tissue abnormality within the neck. Upper chest: Visualized upper chest demonstrates no acute finding. Review of the MIP images confirms the above findings CTA HEAD FINDINGS Anterior circulation: Evaluation of the intracranial circulation mildly limited by timing of the contrast bolus, with suboptimal opacification of the  intracranial arterial vasculature. Internal carotid arteries widely patent to the termini without stenosis. ICA termini well perfused. A1 segments patent bilaterally. Left A1 hypoplastic, accounting for the slightly diminutive left ICA is compared to the right. Normal anterior communicating artery. Anterior cerebral arteries widely patent to their distal aspects. Right M1 and right MCA bifurcation patent. Although difficult to be certain, there is question of a proximal right M2 or possibly M3 branch out, best seen on sagittal sequence (series 9, image 53). Remainder of the right MCA branches appears to be patent. Left M1 and left MCA bifurcation patent. Distal left MCA branches well perfused. Posterior circulation: Dominant right vertebral artery widely patent to  the vertebrobasilar junction. Right PICA grossly patent proximally. Hypoplastic left vertebral artery terminates in PICA. Left PICA patent. Basilar widely patent to its distal aspect. Superior cerebellar arteries patent bilaterally. PCAs both widely patent to their distal aspects. Venous sinuses: Grossly patent allowing for timing of the contrast bolus. Anatomic variants: Hypoplastic left vertebral artery terminating in PICA. Delayed phase: Not performed. Review of the MIP images confirms the above findings IMPRESSION: 1. Positive CTA for emergent large vessel occlusion, with proximal right M2/M3 occlusion. 2. Otherwise wide patency of the major arterial vasculature of the head and neck. These results were communicated to Dr. Lorraine Lax At 2:28 amon 7/10/2020by text page via the [facility] messaging system. Results again discussed with Dr. Lorraine Lax at 2:50 a.m. Electronically Signed   By: Jeannine Boga M.D.   On: 07/03/2019 02:54    Procedures Procedures (including critical care time)  Medications Ordered in ED Medications - No data to display   Initial Impression / Assessment and Plan / ED Course  I have reviewed the triage vital signs and the  nursing notes.  Pertinent labs & imaging results that were available during my care of the patient were reviewed by me and considered in my medical decision making (see chart for details).       Code stroke seen on arrival with Dr. Lorraine Lax.  CT is negative for hemorrhage.  Concern for LVO and CT obtained which confirms this.  Discussed with patient and his wife by telephone.  Decision will be made to proceed to interventional radiology for possible clot retrieval.  Not TPA candidate due to Xarelto use.   Patient intubated in ED by anesthesia prior to transport to IR. Tachycardia improving  CRITICAL CARE Performed by: Ezequiel Essex Total critical care time: 45 minutes Critical care time was exclusive of separately billable procedures and treating other patients. Critical care was necessary to treat or prevent imminent or life-threatening deterioration. Critical care was time spent personally by me on the following activities: development of treatment plan with patient and/or surrogate as well as nursing, discussions with consultants, evaluation of patient's response to treatment, examination of patient, obtaining history from patient or surrogate, ordering and performing treatments and interventions, ordering and review of laboratory studies, ordering and review of radiographic studies, pulse oximetry and re-evaluation of patient's condition.   Final Clinical Impressions(s) / ED Diagnoses   Final diagnoses:  Acute ischemic stroke [facility])    ED Discharge Orders    None       Badr Piedra, Annie Main, MD 07/03/19 [phone]

## 2019-07-03 NOTE — Progress Notes (Signed)
8FR sheath pulled from Right femoral artery at 1630.   7FR exoseal implanted and vpad applied with manual pressure hold.  Complete hemostasis achieved at 1655.   Gauze and tegaderm applied.  Distal pulses palpable bilaterally.  Showed groin site to [employer].    France Ravens RTR Kaydee Magel RTR

## 2019-07-03 NOTE — Progress Notes (Signed)
Patient transported to MRI from 4N. No complications noted.

## 2019-07-03 NOTE — Progress Notes (Signed)
STROKE TEAM PROGRESS NOTE   INTERVAL HISTORY I have reviewed history of presenting illness, electronic medical records as well as imaging films in PACS.  Patient was diagnosed with stage IV non-small cell carcinoma of the lung in May 2020.  He had an abnormal MRI on 06/10/2019 which showed multiple embolic cortical and subcortical bilateral infarcts.  2D echo at that time was unremarkable.  He has deep vein thrombosis in the legs and has been on anticoagulation with Xarelto and did take his last dose a few hours before his stroke.  He underwent successful mechanical thrombectomy and revascularization of the right M2 clot with only partial recanalization of the right A2/A3 clot is currently intubated and sedated on propofol.  His blood pressure has been adequately controlled.  Vitals:   07/03/19 0304 07/03/19 0652 07/03/19 0654 07/03/19 0700  BP:  121/77    Pulse:    92  Resp:  (!) 26  18  Temp:    (!) 96.1 F (35.6 C)  TempSrc:    Bladder  SpO2:   100% 100%  Weight: 67.3 kg     Height: 5\' 11"  (1.803 m)       CBC:  Recent Labs  Lab 07/01/19 1059 07/03/19 0205 07/03/19 0213  WBC 4.6 6.1  --   NEUTROABS 3.1 4.3  --   HGB 10.3* 9.6* 9.9*  HCT 33.4* 30.5* 29.0*  MCV 85.6 83.8  --   PLT 215 256  --     Basic Metabolic Panel:  Recent Labs  Lab 07/01/19 1059 07/03/19 0205 07/03/19 0213  NA 136 133* 133*  K 4.6 3.5 3.3*  CL 102 100 99  CO2 27 22  --   GLUCOSE 93 124* 128*  BUN 16 16 15   CREATININE 0.86 0.89 0.70  CALCIUM 8.4* 8.2*  --    Lipid Panel:     Component Value Date/Time   CHOL 164 12/02/2018 0906   TRIG 73 12/02/2018 0906   HDL 49 12/02/2018 0906   CHOLHDL 3.3 12/02/2018 0906   VLDL 17 05/23/2017 0818   LDLCALC 99 12/02/2018 0906   HgbA1c:  Lab Results  Component Value Date   HGBA1C 5.3 12/02/2018   Urine Drug Screen:     Component Value Date/Time   LABOPIA POSITIVE (A) 07/03/2019 0258   COCAINSCRNUR NONE DETECTED 07/03/2019 0258   LABBENZ NONE  DETECTED 07/03/2019 0258   AMPHETMU NONE DETECTED 07/03/2019 0258   THCU NONE DETECTED 07/03/2019 0258   LABBARB NONE DETECTED 07/03/2019 0258    Alcohol Level     Component Value Date/Time   ETH <10 07/03/2019 0205    IMAGING Ct Code Stroke Cta Head W/wo Contrast  Result Date: 07/03/2019 CLINICAL DATA:  Code stroke. Initial evaluation for acute stroke, left-sided deficits. EXAM: CT ANGIOGRAPHY HEAD AND NECK TECHNIQUE: Multidetector CT imaging of the head and neck was performed using the standard protocol during bolus administration of intravenous contrast. Multiplanar CT image reconstructions and MIPs were obtained to evaluate the vascular anatomy. Carotid stenosis measurements (when applicable) are obtained utilizing NASCET criteria, using the distal internal carotid diameter as the denominator. CONTRAST:  26mL OMNIPAQUE IOHEXOL 350 MG/ML SOLN COMPARISON:  Prior MRI from 06/10/2019 FINDINGS: CT HEAD FINDINGS Brain: Examination technically limited by positioning and motion artifact. Age-related cerebral atrophy. No acute intracranial hemorrhage. No acute large vessel territory infarct. No mass lesion or midline shift. No hydrocephalus. No extra-axial fluid collection. Vascular: No hyperdense vessel. Skull: Scalp soft tissues and calvarium within normal limits.  Sinuses: Paranasal sinuses are largely clear.  No mastoid effusion. Orbits: Globes and orbital soft tissues demonstrate no acute finding. Right gaze noted. Review of the MIP images confirms the above findings CTA NECK FINDINGS Aortic arch: Mild atheromatous plaque about the visualized aortic arch. Origin of the great vessels not assessed on this exam. Visualized subclavian arteries widely patent. Right carotid system: Right common and internal carotid arteries widely patent without stenosis, dissection, or occlusion. Left carotid system: Visualized left common carotid artery widely patent. No significant atheromatous narrowing about the left  bifurcation. Left ICA widely patent to the skull base without stenosis, dissection, or occlusion. Vertebral arteries: Both vertebral arteries arise from the subclavian arteries. Right vertebral artery dominant. Vertebral arteries widely patent within the neck without stenosis, dissection, or occlusion. Skeleton: No acute osseous finding. Scattered predominantly subcentimeter sclerotic foci within the visualized spine, nonspecific. These did not appear to be PET avid on prior PET-CT from 05/06/2019. Other neck: No other acute soft tissue abnormality within the neck. Upper chest: Visualized upper chest demonstrates no acute finding. Review of the MIP images confirms the above findings CTA HEAD FINDINGS Anterior circulation: Evaluation of the intracranial circulation mildly limited by timing of the contrast bolus, with suboptimal opacification of the intracranial arterial vasculature. Internal carotid arteries widely patent to the termini without stenosis. ICA termini well perfused. A1 segments patent bilaterally. Left A1 hypoplastic, accounting for the slightly diminutive left ICA is compared to the right. Normal anterior communicating artery. Anterior cerebral arteries widely patent to their distal aspects. Right M1 and right MCA bifurcation patent. Although difficult to be certain, there is question of a proximal right M2 or possibly M3 branch out, best seen on sagittal sequence (series 9, image 53). Remainder of the right MCA branches appears to be patent. Left M1 and left MCA bifurcation patent. Distal left MCA branches well perfused. Posterior circulation: Dominant right vertebral artery widely patent to the vertebrobasilar junction. Right PICA grossly patent proximally. Hypoplastic left vertebral artery terminates in PICA. Left PICA patent. Basilar widely patent to its distal aspect. Superior cerebellar arteries patent bilaterally. PCAs both widely patent to their distal aspects. Venous sinuses: Grossly patent  allowing for timing of the contrast bolus. Anatomic variants: Hypoplastic left vertebral artery terminating in PICA. Delayed phase: Not performed. Review of the MIP images confirms the above findings IMPRESSION: 1. Positive CTA for emergent large vessel occlusion, with proximal right M2/M3 occlusion. 2. Otherwise wide patency of the major arterial vasculature of the head and neck. These results were communicated to Dr. Lorraine Lax At 2:28 amon 7/10/2020by text page via the [facility] messaging system. Results again discussed with Dr. Lorraine Lax at 2:50 a.m. Electronically Signed   By: Jeannine Boga M.D.   On: 07/03/2019 02:54   Ct Code Stroke Cta Neck W/wo Contrast  Result Date: 07/03/2019 CLINICAL DATA:  Code stroke. Initial evaluation for acute stroke, left-sided deficits. EXAM: CT ANGIOGRAPHY HEAD AND NECK TECHNIQUE: Multidetector CT imaging of the head and neck was performed using the standard protocol during bolus administration of intravenous contrast. Multiplanar CT image reconstructions and MIPs were obtained to evaluate the vascular anatomy. Carotid stenosis measurements (when applicable) are obtained utilizing NASCET criteria, using the distal internal carotid diameter as the denominator. CONTRAST:  93mL OMNIPAQUE IOHEXOL 350 MG/ML SOLN COMPARISON:  Prior MRI from 06/10/2019 FINDINGS: CT HEAD FINDINGS Brain: Examination technically limited by positioning and motion artifact. Age-related cerebral atrophy. No acute intracranial hemorrhage. No acute large vessel territory infarct. No mass lesion or midline shift.  No hydrocephalus. No extra-axial fluid collection. Vascular: No hyperdense vessel. Skull: Scalp soft tissues and calvarium within normal limits. Sinuses: Paranasal sinuses are largely clear.  No mastoid effusion. Orbits: Globes and orbital soft tissues demonstrate no acute finding. Right gaze noted. Review of the MIP images confirms the above findings CTA NECK FINDINGS Aortic arch: Mild atheromatous  plaque about the visualized aortic arch. Origin of the great vessels not assessed on this exam. Visualized subclavian arteries widely patent. Right carotid system: Right common and internal carotid arteries widely patent without stenosis, dissection, or occlusion. Left carotid system: Visualized left common carotid artery widely patent. No significant atheromatous narrowing about the left bifurcation. Left ICA widely patent to the skull base without stenosis, dissection, or occlusion. Vertebral arteries: Both vertebral arteries arise from the subclavian arteries. Right vertebral artery dominant. Vertebral arteries widely patent within the neck without stenosis, dissection, or occlusion. Skeleton: No acute osseous finding. Scattered predominantly subcentimeter sclerotic foci within the visualized spine, nonspecific. These did not appear to be PET avid on prior PET-CT from 05/06/2019. Other neck: No other acute soft tissue abnormality within the neck. Upper chest: Visualized upper chest demonstrates no acute finding. Review of the MIP images confirms the above findings CTA HEAD FINDINGS Anterior circulation: Evaluation of the intracranial circulation mildly limited by timing of the contrast bolus, with suboptimal opacification of the intracranial arterial vasculature. Internal carotid arteries widely patent to the termini without stenosis. ICA termini well perfused. A1 segments patent bilaterally. Left A1 hypoplastic, accounting for the slightly diminutive left ICA is compared to the right. Normal anterior communicating artery. Anterior cerebral arteries widely patent to their distal aspects. Right M1 and right MCA bifurcation patent. Although difficult to be certain, there is question of a proximal right M2 or possibly M3 branch out, best seen on sagittal sequence (series 9, image 53). Remainder of the right MCA branches appears to be patent. Left M1 and left MCA bifurcation patent. Distal left MCA branches well  perfused. Posterior circulation: Dominant right vertebral artery widely patent to the vertebrobasilar junction. Right PICA grossly patent proximally. Hypoplastic left vertebral artery terminates in PICA. Left PICA patent. Basilar widely patent to its distal aspect. Superior cerebellar arteries patent bilaterally. PCAs both widely patent to their distal aspects. Venous sinuses: Grossly patent allowing for timing of the contrast bolus. Anatomic variants: Hypoplastic left vertebral artery terminating in PICA. Delayed phase: Not performed. Review of the MIP images confirms the above findings IMPRESSION: 1. Positive CTA for emergent large vessel occlusion, with proximal right M2/M3 occlusion. 2. Otherwise wide patency of the major arterial vasculature of the head and neck. These results were communicated to Dr. Lorraine Lax At 2:28 amon 7/10/2020by text page via the [facility] messaging system. Results again discussed with Dr. Lorraine Lax at 2:50 a.m. Electronically Signed   By: Jeannine Boga M.D.   On: 07/03/2019 02:54   Mr Brain Wo Contrast  Result Date: 07/03/2019 CLINICAL DATA:  Septic emboli. Ventilator support. Follow-up intervention for right MCA embolus. EXAM: MRI HEAD WITHOUT CONTRAST TECHNIQUE: Multiplanar, multiecho pulse sequences of the brain and surrounding structures were obtained without intravenous contrast. COMPARISON:  CT study same day.  MRI 06/10/2019 FINDINGS: Brain: Multiple areas of acute infarction demonstrated today. Small acute infarctions seen in June have lost restricted diffusion as expected. Developing restricted diffusion in the right parietal cortical region served by the previously occluded middle cerebral branch. Acute infarction of the cingulate gyrus on the right served by an occluded pericallosal artery on the right. Numerous scattered  punctate foci of acute infarction in both parietal regions left more than right consistent with micro embolic infarctions. No evidence of mass effect or  shift at this time. No hydrocephalus. No extra-axial collection. No MR evidence of hemorrhage. Signal loss related to the occluded right pericallosal artery. Vascular: Major vessels at the base of the brain show flow. Skull and upper cervical spine: Negative Sinuses/Orbits: Clear/normal Other: None IMPRESSION: Multiple areas of acute infarction as outlined above. This includes the right parietal region served by the previously occluded branch and the right cingulate gyrus served by the right pericallosal artery. Numerous punctate acute infarctions in both parietal regions left more than right likely due to micro emboli. Electronically Signed   By: Nelson Chimes M.D.   On: 07/03/2019 10:34   Ct Cerebral Perfusion W Contrast  Result Date: 07/03/2019 CLINICAL DATA:  Initial evaluation for acute stroke, left-sided deficits. EXAM: CT PERFUSION BRAIN TECHNIQUE: Multiphase CT imaging of the brain was performed following IV bolus contrast injection. Subsequent parametric perfusion maps were calculated using RAPID software. CONTRAST:  33mL OMNIPAQUE IOHEXOL 350 MG/ML SOLN COMPARISON:  Prior CT and CTA from earlier same day. FINDINGS: CT Brain Perfusion Findings: CBF (<30%) Volume: 43mL Perfusion (Tmax>6.0s) volume: 121mL Mismatch Volume: 75mL ASPECTS on noncontrast CT Head: 10 at time today. Infarct Core: 33 mL Infarction Location:Acute core infarct involving the mid and posterior right centrum semi ovale as well as the overlying posterior right frontal and right parietal cortex. IMPRESSION: Positive CT perfusion with acute core infarct involving the mid and posterior right MCA territory, estimated volume 33 cc. Moderate-sized surrounding penumbra. Electronically Signed   By: Jeannine Boga M.D.   On: 07/03/2019 02:56   Dg Chest Portable 1 View  Result Date: 07/03/2019 CLINICAL DATA:  Intubation.  Stroke.  History of lung cancer. EXAM: PORTABLE CHEST 1 VIEW COMPARISON:  CT 06/20/2019.  Chest x-ray 05/21/2019.  FINDINGS: Endotracheal tube tip noted 5 cm above the carina. Heart size normal. Prominent right mid lung field infiltrate again noted without interim change. No pleural effusion or pneumothorax. No acute bony abnormality. IMPRESSION: 1.  Endotracheal tube noted with its tip 5 cm above the carina. 2. Prominent right mid lung field infiltrate again noted without interim change. Electronically Signed   By: Marcello Moores  Register   On: 07/03/2019 07:41   Dg Abd Portable 1v  Result Date: 07/03/2019 CLINICAL DATA:  Orogastric tube placement. EXAM: PORTABLE ABDOMEN - 1 VIEW COMPARISON:  None. FINDINGS: The bowel gas pattern is normal. Distal tip of nasogastric tube is seen in proximal stomach. Residual contrast is seen in ureters and kidneys. No radio-opaque calculi or other significant radiographic abnormality are seen. IMPRESSION: Nasogastric tube seen in proximal stomach. No evidence of bowel obstruction or ileus. Electronically Signed   By: Marijo Conception M.D.   On: 07/03/2019 09:03   Ct Head Code Stroke Wo Contrast  Result Date: 07/03/2019 CLINICAL DATA:  Code stroke. Initial evaluation for acute stroke, left-sided deficits. EXAM: CT ANGIOGRAPHY HEAD AND NECK TECHNIQUE: Multidetector CT imaging of the head and neck was performed using the standard protocol during bolus administration of intravenous contrast. Multiplanar CT image reconstructions and MIPs were obtained to evaluate the vascular anatomy. Carotid stenosis measurements (when applicable) are obtained utilizing NASCET criteria, using the distal internal carotid diameter as the denominator. CONTRAST:  45mL OMNIPAQUE IOHEXOL 350 MG/ML SOLN COMPARISON:  Prior MRI from 06/10/2019 FINDINGS: CT HEAD FINDINGS Brain: Examination technically limited by positioning and motion artifact. Age-related cerebral atrophy. No  acute intracranial hemorrhage. No acute large vessel territory infarct. No mass lesion or midline shift. No hydrocephalus. No extra-axial fluid  collection. Vascular: No hyperdense vessel. Skull: Scalp soft tissues and calvarium within normal limits. Sinuses: Paranasal sinuses are largely clear.  No mastoid effusion. Orbits: Globes and orbital soft tissues demonstrate no acute finding. Right gaze noted. Review of the MIP images confirms the above findings CTA NECK FINDINGS Aortic arch: Mild atheromatous plaque about the visualized aortic arch. Origin of the great vessels not assessed on this exam. Visualized subclavian arteries widely patent. Right carotid system: Right common and internal carotid arteries widely patent without stenosis, dissection, or occlusion. Left carotid system: Visualized left common carotid artery widely patent. No significant atheromatous narrowing about the left bifurcation. Left ICA widely patent to the skull base without stenosis, dissection, or occlusion. Vertebral arteries: Both vertebral arteries arise from the subclavian arteries. Right vertebral artery dominant. Vertebral arteries widely patent within the neck without stenosis, dissection, or occlusion. Skeleton: No acute osseous finding. Scattered predominantly subcentimeter sclerotic foci within the visualized spine, nonspecific. These did not appear to be PET avid on prior PET-CT from 05/06/2019. Other neck: No other acute soft tissue abnormality within the neck. Upper chest: Visualized upper chest demonstrates no acute finding. Review of the MIP images confirms the above findings CTA HEAD FINDINGS Anterior circulation: Evaluation of the intracranial circulation mildly limited by timing of the contrast bolus, with suboptimal opacification of the intracranial arterial vasculature. Internal carotid arteries widely patent to the termini without stenosis. ICA termini well perfused. A1 segments patent bilaterally. Left A1 hypoplastic, accounting for the slightly diminutive left ICA is compared to the right. Normal anterior communicating artery. Anterior cerebral arteries widely  patent to their distal aspects. Right M1 and right MCA bifurcation patent. Although difficult to be certain, there is question of a proximal right M2 or possibly M3 branch out, best seen on sagittal sequence (series 9, image 53). Remainder of the right MCA branches appears to be patent. Left M1 and left MCA bifurcation patent. Distal left MCA branches well perfused. Posterior circulation: Dominant right vertebral artery widely patent to the vertebrobasilar junction. Right PICA grossly patent proximally. Hypoplastic left vertebral artery terminates in PICA. Left PICA patent. Basilar widely patent to its distal aspect. Superior cerebellar arteries patent bilaterally. PCAs both widely patent to their distal aspects. Venous sinuses: Grossly patent allowing for timing of the contrast bolus. Anatomic variants: Hypoplastic left vertebral artery terminating in PICA. Delayed phase: Not performed. Review of the MIP images confirms the above findings IMPRESSION: 1. Positive CTA for emergent large vessel occlusion, with proximal right M2/M3 occlusion. 2. Otherwise wide patency of the major arterial vasculature of the head and neck. These results were communicated to Dr. Lorraine Lax At 2:28 amon 7/10/2020by text page via the [facility] messaging system. Results again discussed with Dr. Lorraine Lax at 2:50 a.m. Electronically Signed   By: Jeannine Boga M.D.   On: 07/03/2019 02:54   Cerebral angiogram S/O RT common carotid arteriogram followed complete revascularization of occluded MCA dominant mid division with x2 passes with 72mm x 58mm embotrap and x 3 passes with solitaire x 4 x 40 mm retriever device achieving a TICI 3 revascularization. Also partial revascularization of occluded ACA A2 A3 region with x 1 pass with solitaire X 56mmx 40 mm device.  2D Echocardiogram  06/30/2019  1. The left ventricle has normal systolic function with an ejection fraction of 60-65%. The cavity size was normal. Left ventricular diastolic Doppler  parameters are  consistent with impaired relaxation. No evidence of left ventricular regional wall  motion abnormalities.  2. The right ventricle has normal systolic function. The cavity was normal. There is no increase in right ventricular wall thickness.  3. Trivial pericardial effusion is present.  4. No evidence of mitral valve stenosis. Mild mitral regurgitation.  5. The aortic valve is tricuspid. No stenosis of the aortic valve.  6. Normal IVC size. No complete TR doppler jet so unable to estimate PA systolic pressure.  PHYSICAL EXAM Middle-aged Caucasian male sedated intubated.  Not in distress.  He has right groin arterial sheath in place. . Afebrile. Head is nontraumatic. Neck is supple without bruit.    Cardiac exam no murmur or gallop. Lungs are clear to auscultation. Distal pulses are well felt. Neurological Exam :  Patient is sedated and intubated.  He opens eyes to commands.  He has right gaze preference.  He can look to midline but not cross it.  Pupils are equal reactive.  Fundi not visualized.  Blinks to threat on the right but not on the left.  His right lower facial weakness.  Tongue midline.  He has dense left hemiplegia and does not withdraw even to painful stimuli.  He has purposeful antigravity movement on the right.  He has left-sided neglect.  He withdraws to pain on the right but not on the left.  Right plantars downgoing left upgoing. ASSESSMENT/PLAN [name] is a 68 y.o. male with history of DVT on Xarelto, adenocarcinoma of the right lung, hyperlipidemia, prior strokes noted on MRI presenting with L sided weakness and R gaze. No tPA d/t xarelto.   Stroke:   R MCA/ACA infarct s/p IR with resultant left hemiplegia and neglect and gaze palsy- infarct embolic secondary to hypercoagulable state from lung cancer on Xarelto for DVT since April vs paradoxical embolism vs marantic endocarditis  Code Stroke CT head No acute abnormality. Atrophy. ASPECTS 10.     CTA head  & neck ELVO prox R M2/M3 occlusion  CT perfusion acute core infarct mid and posterior R MCA territory, vol 33cc. Mod-sized penumbra  Cerebral angio R MCA occlusion, TICI3 revascularization w/ 2 passes embotrap and 3 passes solitaire; partial revascularization occluded ACA A2 A3 w/ 1 pass solitaire  Post IR CT slight R SAH in R perisylvian fissure and anterior interhemispheric fissure. No gross mass effect.  MRI  Multiple infarcts R parietal and R cingulate gyrus. Numerous punctate L>R parietal due to small emboli  2D Echo EF 60-65%. No source of embolus   TCD w/ bubble pending   Consider TEE to rule or marantic endocarditis  LDL pending   HgbA1c pending   SCDs for VTE prophylaxis  Xarelto (rivaroxaban) daily prior to admission, now on No antithrombotic given possible post IR hemorrhage. Add/resume AC/antithrombotic based on MRI results   Therapy recommendations:  pending   Disposition:  pending   Respiratory Failure  Intubated for IR  Left intubated post IR  R leg DVT/SVT  Confirmed by doppler 04/15/2019 and 06/20/2019  On xarelto  Stage 4 adenocarcinoma R lung  On chemo  Hyperlipidemia  Home meds:  pravachol 20  LDL pending, goal < 70  Resume statin based on LDL finding  Dysphagia . Secondary to stroke . NPO   Other Stroke Risk Factors  Advanced age  Former Cigarette smoker, quit 4 mos ago, advised to stop smoking  ETOH use, advised to drink no more than 2 drink(s) a day  UDS positive for opiates -  on hydrocodone at home   Hx stroke/TIA  Other Active Problems  GERD  Hospital day # 0  I have personally obtained history,examined this patient, reviewed notes, independently viewed imaging studies, participated in medical decision making and plan of care.ROS completed by me personally and pertinent positives fully documented  I have made any additions or clarifications directly to the above note.  He presented with sudden onset of left hemiplegia  and neglect and field loss secondary right MCA infarct from right M2 occlusion and underwent successful mechanical thrombectomy but clinical exam so far does not show significant improvement.  MRI scan shows multiple infarcts involving right parietal, right cingulate gyrus, tiny punctate infarcts in the left parietal area as well.  Continue close neurological monitoring and strict blood pressure control as per post thrombectomy protocol.  Keep intubated overnight till right groin arterial sheath is removed tomorrow morning as per IR.  He will need to be started on IV heparin drip 24 hours post last dose of Xarelto.  Discussed treatment plan with pharmacy.  Discussed with Dr. Lynetta Mare critical care medicine and Dr. Mickeal Skinner patient's neuro oncologist.  He will likely need TEE to look for marantic endocarditis and TCD bubble study to look for PFO for paradoxical embolism.  Will likely switch his anticoagulation from Xarelto to Pradaxa given failure of Xarelto. This patient is critically ill and at significant risk of neurological worsening, death and care requires constant monitoring of vital signs, hemodynamics,respiratory and cardiac monitoring, extensive review of multiple databases, frequent neurological assessment, discussion with family, other specialists and medical decision making of high complexity.I have made any additions or clarifications directly to the above note.This critical care time does not reflect procedure time, or teaching time or supervisory time of PA/NP/Med Resident etc but could involve care discussion time.  I spent 50 minutes of neurocritical care time  in the care of  this patient.     Antony Contras, MD Medical Director [facility] Stroke Center Pager: [phone] 07/03/2019 4:18 PM   To contact Stroke Continuity provider, please refer to [email or web address] After hours, contact General Neurology

## 2019-07-03 NOTE — Progress Notes (Signed)
Initial Nutrition Assessment  DOCUMENTATION CODES:   Not applicable  INTERVENTION:   If remains intubated recommend  Vital AF 1.2 @ 40 ml/hr 30 ml Prostat TID  Provides: 1452 kcal, 117 grams protein, and 778 ml free water.  TF regimen and propofol at current rate providing 1742 total kcal/day (105 % of kcal needs)   NUTRITION DIAGNOSIS:   Increased nutrient needs related to cancer and cancer related treatments as evidenced by estimated needs.  GOAL:   Patient will meet greater than or equal to 90% of their needs  MONITOR:   I & O's, Vent status  REASON FOR ASSESSMENT:   Ventilator, Malnutrition Screening Tool    ASSESSMENT:   Pt with PMH of DVT on Xarelto, stage IV lung cancer who started chemo 6/24, pt recently hospitalized for PNA, LD, prior strokes now admitted with L M2 occlusion s/p IR for mechanical thrombectomy.   Pt discussed during ICU rounds and with RN.   Patient remains ntubated on ventilator support MV: 9.1 L/min Temp (24hrs), Avg:98 F (36.7 C), Min:96.1 F (35.6 C), Max:99.1 F (37.3 C)  Propofol: 11 ml/hr (30 mcg) provides: 290 kcal  Medications reviewed Labs reviewed: K+ 3.3 (L)    NUTRITION - FOCUSED PHYSICAL EXAM:  Deferred   Diet Order:   Diet Order            Diet NPO time specified  Diet effective now              EDUCATION NEEDS:   No education needs have been identified at this time  Skin:  Skin Assessment: Reviewed RN Assessment  Last BM:  unknown  Height:   Ht Readings from Last 1 Encounters:  07/03/19 5\' 11"  (1.803 m)    Weight:   Wt Readings from Last 1 Encounters:  07/03/19 67.3 kg    Ideal Body Weight:  78.1 kg  BMI:  Body mass index is 20.69 kg/m.  Estimated Nutritional Needs:   Kcal:  1749  Protein:  100-120 grams  Fluid:  > 1.6 L/day  Maylon Peppers RD, LDN, CNSC [phone] Pager [phone] After Hours Pager

## 2019-07-03 NOTE — Progress Notes (Signed)
While bathing patient noted red rash on abdomen that is mildly raised, Neuro MD notified and PRN Benadryl order obtained. Will administer.  Candy Sledge, RD

## 2019-07-03 NOTE — Progress Notes (Signed)
ANTICOAGULATION CONSULT NOTE - Initial Consult  Pharmacy Consult for IV Heparin  Indication: DVT and stroke  No Known Allergies  Patient Measurements: Height: 5\' 11"  (180.3 cm) Weight: 148 lb 5.9 oz (67.3 kg) IBW/kg (Calculated) : 75.3 Heparin Dosing Weight: 67.3 kg  Vital Signs: Temp: 99.7 F (37.6 C) (07/10 1800) Temp Source: Bladder (07/10 0700) BP: 94/56 (07/10 1800) Pulse Rate: 96 (07/10 1800)  Labs: Recent Labs    07/01/19 1059  07/03/19 0205 07/03/19 0213 07/03/19 0828 07/03/19 1633  HGB 10.3*   < > 9.6* 9.9* 7.5*  --   HCT 33.4*  --  30.5* 29.0* 22.0*  --   PLT 215  --  256  --   --   --   APTT  --   --  51*  --   --  38*  LABPROT  --   --  35.1*  --   --   --   INR  --   --  3.6*  --   --   --   CREATININE 0.86  --  0.89 0.70  --   --    < > = values in this interval not displayed.    Estimated Creatinine Clearance: 85.3 mL/min (by C-G formula based on SCr of 0.7 mg/dL).   Medical History: Past Medical History:  Diagnosis Date  . Cancer ([place])    stage 4 adenocarcinoma right lung  . GERD (gastroesophageal reflux disease)   . Hyperlipidemia   . Stroke ([place])    25-30 emboli seen on screening MRI    Medications:  Infusions:  . sodium chloride    . sodium chloride 500 mL/hr at 07/03/19 1033  . sodium chloride    . clevidipine 6 mg/hr (07/03/19 1800)  . heparin    . phenylephrine (NEO-SYNEPHRINE) Adult infusion    . propofol (DIPRIVAN) infusion 30 mcg/kg/min (07/03/19 1800)    Assessment: 68 year old male admitted as code stroke on 7/10 early am - no tPA as was on Xarelto prior to admission for recent DVT with last dose on 7/9 at 1700 PM. Patient was found to have R M2 M3 occlusion and went to IR for mechanical thrombectomy around 6 AM this morning. Sheath remains in place due to recent Xarelto with plans to remove tomorrow morning. Dr. Leonie Man requested to start IV Heparin drip per pharmacy protocol once safe post last Xarelto dose due to RLE DVT (LE  Dopplers 06/20/19).   Last Xarelto dose on 07/02/19 at 1700 PM (Day #15 of start pack - still on BID dosing).  Hgb down to 7.5 from 10.3. Platelets wnl.  SCr within normal limits. CrCl~85 mL/min.  *Heparin level will likely be elevated in setting of recent Xarelto - will be utilizing aPTTs with lower goal of 40-60sec while sheath in tonight to monitor IV Heparin until heparin levels and  aPTTs are correlating.   Baseline aPTT this AM 51> follow up post procedure aptt 36sec - will start heparin low rate (10uts/kg/hr) INR 3.6 (elevated by Xarelto).   Goal of Therapy:  Heparin level 0.1 to 0.25 units/ml aPTT 40-60 seconds Monitor platelets by anticoagulation protocol: Yes  *Targeting low post-IR levels since sheath in place per Dr. Leonie Man - plans to hold around sheath removal in AM then increase Heparin goals to CVA goals upon restart.    Plan:  Heparin 600 uts/hr Aptt in 6hr  Ensure hold heparin drip at 5am 7/11 for sheath pull  Bonnita Nasuti Pharm.D. CPP, BCPS Clinical  Pharmacist [phone] 07/03/2019 6:08 PM

## 2019-07-03 NOTE — ED Notes (Signed)
Anesthesiologist at bedside

## 2019-07-03 NOTE — Anesthesia Procedure Notes (Signed)
Arterial Line Insertion Start/End7/09/2019 3:25 AM, 07/03/2019 3:30 AM Performed by: Suzette Battiest, MD, anesthesiologist  Patient location: Pre-op. Preanesthetic checklist: patient identified, IV checked, site marked, risks and benefits discussed, surgical consent, monitors and equipment checked, pre-op evaluation, timeout performed and anesthesia consent Lidocaine 1% used for infiltration Left, radial was placed Catheter size: 20 Fr Hand hygiene performed  and maximum sterile barriers used   Attempts: 2 Procedure performed without using ultrasound guided technique. Following insertion, dressing applied and Biopatch. Post procedure assessment: normal and unchanged  Patient tolerated the procedure well with no immediate complications.

## 2019-07-03 NOTE — Progress Notes (Signed)
ANTICOAGULATION CONSULT NOTE - Initial Consult  Pharmacy Consult for IV Heparin  Indication: DVT and stroke  No Known Allergies  Patient Measurements: Height: 5\' 11"  (180.3 cm) Weight: 148 lb 5.9 oz (67.3 kg) IBW/kg (Calculated) : 75.3 Heparin Dosing Weight: 67.3 kg  Vital Signs: Temp: 99.1 F (37.3 C) (07/10 1215) Temp Source: Bladder (07/10 0700) BP: 101/70 (07/10 1215) Pulse Rate: 101 (07/10 1215)  Labs: Recent Labs    07/01/19 1059  07/03/19 0205 07/03/19 0213 07/03/19 0828  HGB 10.3*   < > 9.6* 9.9* 7.5*  HCT 33.4*  --  30.5* 29.0* 22.0*  PLT 215  --  256  --   --   APTT  --   --  51*  --   --   LABPROT  --   --  35.1*  --   --   INR  --   --  3.6*  --   --   CREATININE 0.86  --  0.89 0.70  --    < > = values in this interval not displayed.    Estimated Creatinine Clearance: 85.3 mL/min (by C-G formula based on SCr of 0.7 mg/dL).   Medical History: Past Medical History:  Diagnosis Date  . Cancer ([place])    stage 4 adenocarcinoma right lung  . GERD (gastroesophageal reflux disease)   . Hyperlipidemia   . Stroke ([place])    25-30 emboli seen on screening MRI    Medications:  Infusions:  . sodium chloride    . sodium chloride 500 mL/hr at 07/03/19 1033  . sodium chloride    . ceFAZolin    . clevidipine    . phenylephrine (NEO-SYNEPHRINE) Adult infusion    . propofol (DIPRIVAN) infusion 30 mcg/kg/min (07/03/19 1100)    Assessment: 68 year old male admitted as code stroke on 7/10 early am - no tPA as was on Xarelto prior to admission for recent DVT with last dose on 7/9 at 1700 PM. Patient was found to have R M2 M3 occlusion and went to IR for mechanical thrombectomy around 6 AM this morning. Sheath remains in place due to recent Xarelto with plans to remove tomorrow morning. Dr. Leonie Man requested to start IV Heparin drip per pharmacy protocol once safe post last Xarelto dose due to RLE DVT (LE Dopplers 06/20/19).   Last Xarelto dose on 07/02/19 at 1700 PM (Day  #15 of start pack - still on BID dosing).  Hgb down to 7.5 from 10.3. Platelets wnl.  SCr within normal limits. CrCl~85 mL/min.  *Heparin level will likely be elevated in setting of recent Xarelto - will be utilizing aPTTs with lower goal of 66-85 seconds to monitor IV Heparin until heparin levels and  aPTTs are correlating.   Baseline aPTT this AM 51. INR 3.6 (elevated by Xarelto).   Goal of Therapy:  Heparin level 0.1 to 0.25 units/ml aPTT 40-60 seconds Monitor platelets by anticoagulation protocol: Yes  *Targeting low post-IR levels since sheath in place per Dr. Leonie Man - plans to hold around sheath removal in AM then increase Heparin goals to CVA goals upon restart.    Plan:  Note last dose Xarelto on 7/9 at 1700 PM Due to elevated aPTT, will repeat aPTT.   Will plan to start low dose Heparin to start 7/10 at 1700 PM (24 hours since last Xarelto dose).   Sloan Leiter, PharmD, BCPS, BCCCP Clinical Pharmacist Please refer to [facility] for [facility] numbers 07/03/2019,1:13 PM

## 2019-07-03 NOTE — ED Triage Notes (Signed)
Came in via EMS; c/o severe left sided weakness discovered upon waking up at noon. Reported [address] 07/02/2019; pt is reported to be on Xarelto d/t DVT; Pt is reported to have hx of lung ca. and is on Chemo.

## 2019-07-03 NOTE — Progress Notes (Signed)
TCD bubble study completed with Dr. Leonie Man.  07/03/2019 1:13 PM Maudry Mayhew, MHA, RVT, RDCS, RDMS

## 2019-07-03 NOTE — ED Notes (Signed)
Wife's # [phone]

## 2019-07-04 LAB — CBC WITH DIFFERENTIAL/PLATELET
Abs Immature Granulocytes: 0.04 10*3/uL (ref 0.00–0.07)
Basophils Absolute: 0 10*3/uL (ref 0.0–0.1)
Basophils Relative: 0 %
Eosinophils Absolute: 0 10*3/uL (ref 0.0–0.5)
Eosinophils Relative: 0 %
HCT: 24.6 % — ABNORMAL LOW (ref 39.0–52.0)
Hemoglobin: 7.7 g/dL — ABNORMAL LOW (ref 13.0–17.0)
Immature Granulocytes: 1 %
Lymphocytes Relative: 5 %
Lymphs Abs: 0.4 10*3/uL — ABNORMAL LOW (ref 0.7–4.0)
MCH: 27.1 pg (ref 26.0–34.0)
MCHC: 31.3 g/dL (ref 30.0–36.0)
MCV: 86.6 fL (ref 80.0–100.0)
Monocytes Absolute: 0.7 10*3/uL (ref 0.1–1.0)
Monocytes Relative: 10 %
Neutro Abs: 6 10*3/uL (ref 1.7–7.7)
Neutrophils Relative %: 84 %
Platelets: 245 10*3/uL (ref 150–400)
RBC: 2.84 MIL/uL — ABNORMAL LOW (ref 4.22–5.81)
RDW: 15.4 % (ref 11.5–15.5)
WBC: 7.2 10*3/uL (ref 4.0–10.5)
nRBC: 0 % (ref 0.0–0.2)

## 2019-07-04 LAB — BASIC METABOLIC PANEL
Anion gap: 10 (ref 5–15)
BUN: 12 mg/dL (ref 8–23)
CO2: 20 mmol/L — ABNORMAL LOW (ref 22–32)
Calcium: 7.8 mg/dL — ABNORMAL LOW (ref 8.9–10.3)
Chloride: 106 mmol/L (ref 98–111)
Creatinine, Ser: 0.68 mg/dL (ref 0.61–1.24)
GFR calc Af Amer: >60 mL/min (ref >60)
GFR calc non Af Amer: >60 mL/min (ref >60)
Glucose, Bld: 103 mg/dL — ABNORMAL HIGH (ref 70–99)
Potassium: 3.6 mmol/L (ref 3.5–5.1)
Sodium: 136 mmol/L (ref 135–145)

## 2019-07-04 LAB — HEPARIN LEVEL (UNFRACTIONATED): Heparin Unfractionated: 0.62 IU/mL (ref 0.30–0.70)

## 2019-07-04 LAB — APTT
aPTT: 39 seconds — ABNORMAL HIGH (ref 24–36)
aPTT: 53 seconds — ABNORMAL HIGH (ref 24–36)
aPTT: 50 seconds — ABNORMAL HIGH (ref 24–36)

## 2019-07-04 LAB — ACID FAST CULTURE WITH REFLEXED SENSITIVITIES (MYCOBACTERIA): Acid Fast Culture: NEGATIVE

## 2019-07-04 LAB — HEMOGLOBIN A1C
Hgb A1c MFr Bld: 5.7 % — ABNORMAL HIGH (ref 4.8–5.6)
Mean Plasma Glucose: 117 mg/dL

## 2019-07-04 MED ORDER — ORAL CARE MOUTH RINSE: 15.0000 mL | Freq: Two times a day (BID) | OROMUCOSAL | Status: DC

## 2019-07-04 MED ORDER — CHLORHEXIDINE GLUCONATE 0.12 % MT SOLN
15.0000 mL | Freq: Two times a day (BID) | OROMUCOSAL | Status: DC
  Administered 2019-07-04 – 2019-07-10 (×12): 15 mL via OROMUCOSAL
  Filled 2019-07-04 (×10): qty 15

## 2019-07-04 MED ORDER — HEPARIN (PORCINE) 25000 UT/250ML-% IV SOLN
1050.0000 [IU]/h | INTRAVENOUS | Status: DC
  Administered 2019-07-04: 03:00:00 800 [IU]/h via INTRAVENOUS
  Filled 2019-07-04: qty 250

## 2019-07-04 NOTE — Progress Notes (Signed)
ANTICOAGULATION CONSULT NOTE - Follow-up Consult  Pharmacy Consult for IV Heparin  Indication: DVT and stroke  No Known Allergies  Patient Measurements: Height: 5\' 11"  (180.3 cm) Weight: 148 lb 5.9 oz (67.3 kg) IBW/kg (Calculated) : 75.3 Heparin Dosing Weight: 67.3 kg  Vital Signs: Temp: 98.6 F (37 C) (07/11 1600) Temp Source: Axillary (07/11 1600) BP: 136/70 (07/11 1900) Pulse Rate: 121 (07/11 1900)  Labs: Recent Labs    07/03/19 0205 07/03/19 0213 07/03/19 0828  07/04/19 0051 07/04/19 0544 07/04/19 0815 07/04/19 1812  HGB 9.6* 9.9* 7.5*  --   --  7.7*  --   --   HCT 30.5* 29.0* 22.0*  --   --  24.6*  --   --   PLT 256  --   --   --   --  245  --   --   APTT 51*  --   --    < > 39*  --  53* 50*  LABPROT 35.1*  --   --   --   --   --   --   --   INR 3.6*  --   --   --   --   --   --   --   HEPARINUNFRC  --   --   --   --   --   --  0.62  --   CREATININE 0.89 0.70  --   --   --  0.68  --   --    < > = values in this interval not displayed.    Estimated Creatinine Clearance: 85.3 mL/min (by C-G formula based on SCr of 0.68 mg/dL).   Medical History: Past Medical History:  Diagnosis Date  . Cancer ([place])    stage 4 adenocarcinoma right lung  . GERD (gastroesophageal reflux disease)   . Hyperlipidemia   . Stroke ([place])    25-30 emboli seen on screening MRI    Medications:  Infusions:  . sodium chloride 75 mL/hr at 07/04/19 1800  . sodium chloride 500 mL/hr at 07/03/19 1033  . sodium chloride    . heparin 900 Units/hr (07/04/19 1800)  . phenylephrine (NEO-SYNEPHRINE) Adult infusion    . propofol (DIPRIVAN) infusion Stopped (07/04/19 0744)    Assessment: 68 year old male admitted as code stroke on 7/10 early am - no tPA as was on Xarelto prior to admission for recent DVT with last dose on 7/9 at 1700 PM. Patient was found to have R M2 M3 occlusion and went to IR for mechanical thrombectomy around 6 AM this morning. Sheath remains in place due to recent  Xarelto with plans to remove tomorrow morning. Dr. Leonie Man requested to start IV Heparin drip per pharmacy protocol once safe post last Xarelto dose due to RLE DVT (LE Dopplers 06/20/19).  Last Xarelto dose on 07/02/19 at 1700 PM -aPTT = 50   Goal of Therapy:  Heparin level 0.3 to 0.5 units/ml aPTT 66-85 seconds Monitor platelets by anticoagulation protocol: Yes    Plan:  Increase Heparin to 1050 units/hr  Monitor for any signs of bleeding and daily CBC.  Daily APTT and Heparin level until levels correlating.   Hildred Laser, PharmD Clinical Pharmacist **Pharmacist phone directory can now be found on [email or web address] ([email or web address]).  Listed under [place].

## 2019-07-04 NOTE — Evaluation (Signed)
Physical Therapy Evaluation Patient Details Name: Kyle Garcia MRN: [id number] DOB: 07/20/1951 Today's Date: 07/04/2019   History of Present Illness  Patient is a 68 y/o male who presents with left sided weakness. Head CT-Positive CTA for emergent large vessel occlusion, with proximal right M2/M3 occlusion. Brain MRI- large right MCA/ACA infarct with smaller infarcts consistent with embolic phenomenon (95-63 infarcts seen). Now s/p mechanical thrombectomy on 7/10. Intubated 7/10-7/11. Recent diagnosis of RLE DVT (6/27) on Xarelto, HLD and stage IV adenocarcinoma of the lung in May 2020.  Clinical Impression  Patient presents with left hemiplegia, right gaze preference, impaired balance, left neglect, impaired sensation on left side and impaired mobility s/p above. Pt independent PTA and lives with wife. Today, pt requires Max A for bed mobility and transfer to chair. Requires assist for static sitting balance. Able to gaze to midline and towards left with max cues but not sustain. Noted to have sustained clonus at left ankle and swelling present distally on RLE secondary to DVT. Sp02 stayed >89% on RA during session. Pt with no active movement in LUE/LE today. Also noted to have left hemibody neglect. Pt coughing up copious secretions with activity. Would benefit from CIR to maximize independence, mobility and ease burden of care prior to return home. Will follow acutely.    Follow Up Recommendations CIR;Supervision for mobility/OOB;Supervision/Assistance - 24 hour    Equipment Recommendations  Other (comment)(defer)    Recommendations for Other Services Rehab consult     Precautions / Restrictions Precautions Precautions: Fall Restrictions Weight Bearing Restrictions: No      Mobility  Bed Mobility Overal bed mobility: Needs Assistance Bed Mobility: Supine to Sit     Supine to sit: Max assist;HOB elevated     General bed mobility comments: Assist to bring LEs to EOB, elevate  trunk and scoot bottom towards EOB.  Transfers Overall transfer level: Needs assistance   Transfers: Stand Pivot Transfers     Squat pivot transfers: +2 physical assistance;Max assist     General transfer comment: Assist of 2 to stand from EOB and SPT to chair. Pt able to get fully upright with therapist supporting left knee. Some possible activation noted with standing on left but difficult to fully assess.  Ambulation/Gait             General Gait Details: Unable  Financial trader Rankin (Stroke Patients Only) Modified Rankin (Stroke Patients Only) Pre-Morbid Rankin Score: No significant disability Modified Rankin: Severe disability     Balance Overall balance assessment: Needs assistance Sitting-balance support: Feet supported;Single extremity supported Sitting balance-Leahy Scale: Poor Sitting balance - Comments: Requires UE support with right and mod-Max A for sitting balance. Sacral sitting, able to initiate spinal extensors with cues for head to look forward but not sustain. Postural control: Left lateral lean;Posterior lean Standing balance support: During functional activity Standing balance-Leahy Scale: Zero Standing balance comment: Max A of 2 for standing                             Pertinent Vitals/Pain Pain Assessment: No/denies pain    Home Living Family/patient expects to be discharged to:: Private residence Living Arrangements: Spouse/significant other Available Help at Discharge: Family Type of Home: House Home Access: Stairs to enter Entrance Stairs-Rails: Right Entrance Stairs-Number of Steps: 2 Home Layout: One level        Prior  Function Level of Independence: Independent         Comments: Retired.     Hand Dominance   Dominant Hand: Right    Extremity/Trunk Assessment   Upper Extremity Assessment Upper Extremity Assessment: Defer to OT evaluation;LUE deficits/detail LUE  Deficits / Details: No active movement LUE. PROM WFL. LUE Sensation: decreased light touch;decreased proprioception LUE Coordination: decreased fine motor;decreased gross motor    Lower Extremity Assessment Lower Extremity Assessment: LLE deficits/detail;RLE deficits/detail RLE Deficits / Details: Swelling present throughout esp in lower calf likely due to DVT. LLE Deficits / Details: No active movement throughout. Some trace in hip flexors. Sustained clonus at ankle. No response to pressure, noxious stimulus. Able to feel cold hands. LLE Sensation: decreased light touch;decreased proprioception LLE Coordination: decreased fine motor;decreased gross motor    Cervical / Trunk Assessment Cervical / Trunk Assessment: Other exceptions Cervical / Trunk Exceptions: Rounded shoulder and forward neck  Communication   Communication: Other (comment)(low raspy voice secondary to recent extubation)  Cognition Arousal/Alertness: Awake/alert Behavior During Therapy: Flat affect Overall Cognitive Status: Impaired/Different from baseline Area of Impairment: Problem solving;Awareness;Safety/judgement                         Safety/Judgement: Decreased awareness of deficits Awareness: Emergent Problem Solving: Decreased initiation;Difficulty sequencing;Requires verbal cues;Requires tactile cues General Comments: A&Ox4. Pt with right gaze preference and left neglect. Able to gaze to midline and slightly past towards left with max cues but not sustain. Able to count fingers in left visual field in periphery.No awareness of left hemibody.      General Comments General comments (skin integrity, edema, etc.): Sp02 on RA stayed >89%. HR up to 117 bpm. Copious secretions noted during mobility, using yanker.    Exercises     Assessment/Plan    PT Assessment Patient needs continued PT services  PT Problem List Decreased strength;Decreased mobility;Decreased safety awareness;Impaired  tone;Decreased coordination;Decreased range of motion;Decreased activity tolerance;Decreased cognition;Cardiopulmonary status limiting activity;Impaired sensation;Decreased balance;Decreased knowledge of use of DME       PT Treatment Interventions DME instruction;Therapeutic activities;Cognitive remediation;Patient/family education;Therapeutic exercise;Gait training;Balance training;Wheelchair mobility training;Functional mobility training;Neuromuscular re-education    PT Goals (Current goals can be found in the Care Plan section)  Acute Rehab PT Goals Patient Stated Goal: to get better PT Goal Formulation: With patient Time For Goal Achievement: 07/18/19 Potential to Achieve Goals: Fair    Frequency Min 4X/week   Barriers to discharge        Co-evaluation               AM-PAC PT "6 Clicks" Mobility  Outcome Measure Help needed turning from your back to your side while in a flat bed without using bedrails?: Total Help needed moving from lying on your back to sitting on the side of a flat bed without using bedrails?: Total Help needed moving to and from a bed to a chair (including a wheelchair)?: Total Help needed standing up from a chair using your arms (e.g., wheelchair or bedside chair)?: Total Help needed to walk in hospital room?: Total Help needed climbing 3-5 steps with a railing? : Total 6 Click Score: 6    End of Session Equipment Utilized During Treatment: Gait belt Activity Tolerance: Patient tolerated treatment well Patient left: in chair;with call bell/phone within reach;with chair alarm set;with nursing/sitter in room Nurse Communication: Mobility status PT Visit Diagnosis: Hemiplegia and hemiparesis;Difficulty in walking, not elsewhere classified (R26.2) Hemiplegia - Right/Left: Left Hemiplegia - dominant/non-dominant:  Non-dominant Hemiplegia - caused by: Cerebral infarction    Time: 1332-1410 PT Time Calculation (min) (ACUTE ONLY): 38 min   Charges:    PT Evaluation $PT Eval Moderate Complexity: 1 Mod PT Treatments $Therapeutic Activity: 8-22 mins        Wray Kearns, PT, DPT Acute Rehabilitation Services Pager [phone] Office [phone]      Marguarite Arbour A Sabra Heck 07/04/2019, 4:10 PM

## 2019-07-04 NOTE — Progress Notes (Signed)
ANTICOAGULATION CONSULT NOTE - Follow Up Consult  Pharmacy Consult for heparin Indication: DVT and stroke  Labs: Recent Labs    07/01/19 1059  07/03/19 0205 07/03/19 0213 07/03/19 0828 07/03/19 1633 07/04/19 0051  HGB 10.3*   < > 9.6* 9.9* 7.5*  --   --   HCT 33.4*  --  30.5* 29.0* 22.0*  --   --   PLT 215  --  256  --   --   --   --   APTT  --   --  51*  --   --  38* 39*  LABPROT  --   --  35.1*  --   --   --   --   INR  --   --  3.6*  --   --   --   --   CREATININE 0.86  --  0.89 0.70  --   --   --    < > = values in this interval not displayed.    Assessment: 68yo male subtherapeutic on heparin with initial dosing while Xarelto on hold; no gtt issues or signs of bleeding per RN; sheath is out.  Goal of Therapy:  aPTT 66-85 seconds   Plan:  Will increase heparin gtt by 2-3 units/kg/hr to 800 units/hr and check PTT in 6 hours.    Wynona Neat, PharmD, BCPS  07/04/2019,1:32 AM

## 2019-07-04 NOTE — Progress Notes (Signed)
Assisted tele visit to patient with family member.  [name], RN

## 2019-07-04 NOTE — Progress Notes (Signed)
STROKE TEAM PROGRESS NOTE      INTERVAL HISTORY I have reviewed history of presenting illness, electronic medical records as well as imaging films in PACS.  Patient was diagnosed with stage IV non-small cell carcinoma of the lung in May 2020.  He had an abnormal MRI on 06/10/2019 which showed multiple embolic cortical and subcortical bilateral infarcts.  2D echo at that time was unremarkable.  He has deep vein thrombosis in the legs and has been on anticoagulation with Xarelto and did take his last dose a few hours before his stroke.  He underwent successful mechanical thrombectomy and revascularization of the right M2 clot with only partial recanalization of the right A2/A3 clot is currently intubated and sedated on propofol.  His blood pressure has been adequately controlled.Improving. Plan to extubate today. Doing well.   Vitals:   07/04/19 1000 07/04/19 1001 07/04/19 1100 07/04/19 1200  BP: 108/66 124/79 119/75 133/73  Pulse: (!) 113 (!) 110 (!) 110 (!) 114  Resp: (!) 23 (!) 23 20 (!) 22  Temp: 98.6 F (37 C)  98.6 F (37 C) 98.8 F (37.1 C)  TempSrc:    Bladder  SpO2: 100% 100% 100% 100%  Weight:      Height:        CBC:  Recent Labs  Lab 07/03/19 0205  07/03/19 0828 07/04/19 0544  WBC 6.1  --   --  7.2  NEUTROABS 4.3  --   --  6.0  HGB 9.6*   < > 7.5* 7.7*  HCT 30.5*   < > 22.0* 24.6*  MCV 83.8  --   --  86.6  PLT 256  --   --  245   < > = values in this interval not displayed.    Basic Metabolic Panel:  Recent Labs  Lab 07/03/19 0205 07/03/19 0213 07/03/19 0828 07/04/19 0544  NA 133* 133* 135 136  K 3.5 3.3* 3.7 3.6  CL 100 99  --  106  CO2 22  --   --  20*  GLUCOSE 124* 128*  --  103*  BUN 16 15  --  12  CREATININE 0.89 0.70  --  0.68  CALCIUM 8.2*  --   --  7.8*   Lipid Panel:     Component Value Date/Time   CHOL 117 07/03/2019 0500   TRIG 93 07/03/2019 0500   HDL 29 (L) 07/03/2019 0500   CHOLHDL 4.0 07/03/2019 0500   VLDL 19 07/03/2019 0500    LDLCALC 69 07/03/2019 0500   LDLCALC 99 12/02/2018 0906   HgbA1c:  Lab Results  Component Value Date   HGBA1C 5.7 (H) 07/03/2019   Urine Drug Screen:     Component Value Date/Time   LABOPIA POSITIVE (A) 07/03/2019 0258   COCAINSCRNUR NONE DETECTED 07/03/2019 0258   LABBENZ NONE DETECTED 07/03/2019 0258   AMPHETMU NONE DETECTED 07/03/2019 0258   THCU NONE DETECTED 07/03/2019 0258   LABBARB NONE DETECTED 07/03/2019 0258    Alcohol Level     Component Value Date/Time   ETH <10 07/03/2019 0205    IMAGING Ct Code Stroke Cta Head W/wo Contrast  Result Date: 07/03/2019 CLINICAL DATA:  Code stroke. Initial evaluation for acute stroke, left-sided deficits. EXAM: CT ANGIOGRAPHY HEAD AND NECK TECHNIQUE: Multidetector CT imaging of the head and neck was performed using the standard protocol during bolus administration of intravenous contrast. Multiplanar CT image reconstructions and MIPs were obtained to evaluate the vascular anatomy. Carotid stenosis measurements (when applicable)  are obtained utilizing NASCET criteria, using the distal internal carotid diameter as the denominator. CONTRAST:  61mL OMNIPAQUE IOHEXOL 350 MG/ML SOLN COMPARISON:  Prior MRI from 06/10/2019 FINDINGS: CT HEAD FINDINGS Brain: Examination technically limited by positioning and motion artifact. Age-related cerebral atrophy. No acute intracranial hemorrhage. No acute large vessel territory infarct. No mass lesion or midline shift. No hydrocephalus. No extra-axial fluid collection. Vascular: No hyperdense vessel. Skull: Scalp soft tissues and calvarium within normal limits. Sinuses: Paranasal sinuses are largely clear.  No mastoid effusion. Orbits: Globes and orbital soft tissues demonstrate no acute finding. Right gaze noted. Review of the MIP images confirms the above findings CTA NECK FINDINGS Aortic arch: Mild atheromatous plaque about the visualized aortic arch. Origin of the great vessels not assessed on this exam.  Visualized subclavian arteries widely patent. Right carotid system: Right common and internal carotid arteries widely patent without stenosis, dissection, or occlusion. Left carotid system: Visualized left common carotid artery widely patent. No significant atheromatous narrowing about the left bifurcation. Left ICA widely patent to the skull base without stenosis, dissection, or occlusion. Vertebral arteries: Both vertebral arteries arise from the subclavian arteries. Right vertebral artery dominant. Vertebral arteries widely patent within the neck without stenosis, dissection, or occlusion. Skeleton: No acute osseous finding. Scattered predominantly subcentimeter sclerotic foci within the visualized spine, nonspecific. These did not appear to be PET avid on prior PET-CT from 05/06/2019. Other neck: No other acute soft tissue abnormality within the neck. Upper chest: Visualized upper chest demonstrates no acute finding. Review of the MIP images confirms the above findings CTA HEAD FINDINGS Anterior circulation: Evaluation of the intracranial circulation mildly limited by timing of the contrast bolus, with suboptimal opacification of the intracranial arterial vasculature. Internal carotid arteries widely patent to the termini without stenosis. ICA termini well perfused. A1 segments patent bilaterally. Left A1 hypoplastic, accounting for the slightly diminutive left ICA is compared to the right. Normal anterior communicating artery. Anterior cerebral arteries widely patent to their distal aspects. Right M1 and right MCA bifurcation patent. Although difficult to be certain, there is question of a proximal right M2 or possibly M3 branch out, best seen on sagittal sequence (series 9, image 53). Remainder of the right MCA branches appears to be patent. Left M1 and left MCA bifurcation patent. Distal left MCA branches well perfused. Posterior circulation: Dominant right vertebral artery widely patent to the  vertebrobasilar junction. Right PICA grossly patent proximally. Hypoplastic left vertebral artery terminates in PICA. Left PICA patent. Basilar widely patent to its distal aspect. Superior cerebellar arteries patent bilaterally. PCAs both widely patent to their distal aspects. Venous sinuses: Grossly patent allowing for timing of the contrast bolus. Anatomic variants: Hypoplastic left vertebral artery terminating in PICA. Delayed phase: Not performed. Review of the MIP images confirms the above findings IMPRESSION: 1. Positive CTA for emergent large vessel occlusion, with proximal right M2/M3 occlusion. 2. Otherwise wide patency of the major arterial vasculature of the head and neck. These results were communicated to Dr. Lorraine Lax At 2:28 amon 7/10/2020by text page via the [facility] messaging system. Results again discussed with Dr. Lorraine Lax at 2:50 a.m. Electronically Signed   By: Jeannine Boga M.D.   On: 07/03/2019 02:54   Ct Code Stroke Cta Neck W/wo Contrast  Result Date: 07/03/2019 CLINICAL DATA:  Code stroke. Initial evaluation for acute stroke, left-sided deficits. EXAM: CT ANGIOGRAPHY HEAD AND NECK TECHNIQUE: Multidetector CT imaging of the head and neck was performed using the standard protocol during bolus administration of intravenous  contrast. Multiplanar CT image reconstructions and MIPs were obtained to evaluate the vascular anatomy. Carotid stenosis measurements (when applicable) are obtained utilizing NASCET criteria, using the distal internal carotid diameter as the denominator. CONTRAST:  62mL OMNIPAQUE IOHEXOL 350 MG/ML SOLN COMPARISON:  Prior MRI from 06/10/2019 FINDINGS: CT HEAD FINDINGS Brain: Examination technically limited by positioning and motion artifact. Age-related cerebral atrophy. No acute intracranial hemorrhage. No acute large vessel territory infarct. No mass lesion or midline shift. No hydrocephalus. No extra-axial fluid collection. Vascular: No hyperdense vessel. Skull: Scalp  soft tissues and calvarium within normal limits. Sinuses: Paranasal sinuses are largely clear.  No mastoid effusion. Orbits: Globes and orbital soft tissues demonstrate no acute finding. Right gaze noted. Review of the MIP images confirms the above findings CTA NECK FINDINGS Aortic arch: Mild atheromatous plaque about the visualized aortic arch. Origin of the great vessels not assessed on this exam. Visualized subclavian arteries widely patent. Right carotid system: Right common and internal carotid arteries widely patent without stenosis, dissection, or occlusion. Left carotid system: Visualized left common carotid artery widely patent. No significant atheromatous narrowing about the left bifurcation. Left ICA widely patent to the skull base without stenosis, dissection, or occlusion. Vertebral arteries: Both vertebral arteries arise from the subclavian arteries. Right vertebral artery dominant. Vertebral arteries widely patent within the neck without stenosis, dissection, or occlusion. Skeleton: No acute osseous finding. Scattered predominantly subcentimeter sclerotic foci within the visualized spine, nonspecific. These did not appear to be PET avid on prior PET-CT from 05/06/2019. Other neck: No other acute soft tissue abnormality within the neck. Upper chest: Visualized upper chest demonstrates no acute finding. Review of the MIP images confirms the above findings CTA HEAD FINDINGS Anterior circulation: Evaluation of the intracranial circulation mildly limited by timing of the contrast bolus, with suboptimal opacification of the intracranial arterial vasculature. Internal carotid arteries widely patent to the termini without stenosis. ICA termini well perfused. A1 segments patent bilaterally. Left A1 hypoplastic, accounting for the slightly diminutive left ICA is compared to the right. Normal anterior communicating artery. Anterior cerebral arteries widely patent to their distal aspects. Right M1 and right MCA  bifurcation patent. Although difficult to be certain, there is question of a proximal right M2 or possibly M3 branch out, best seen on sagittal sequence (series 9, image 53). Remainder of the right MCA branches appears to be patent. Left M1 and left MCA bifurcation patent. Distal left MCA branches well perfused. Posterior circulation: Dominant right vertebral artery widely patent to the vertebrobasilar junction. Right PICA grossly patent proximally. Hypoplastic left vertebral artery terminates in PICA. Left PICA patent. Basilar widely patent to its distal aspect. Superior cerebellar arteries patent bilaterally. PCAs both widely patent to their distal aspects. Venous sinuses: Grossly patent allowing for timing of the contrast bolus. Anatomic variants: Hypoplastic left vertebral artery terminating in PICA. Delayed phase: Not performed. Review of the MIP images confirms the above findings IMPRESSION: 1. Positive CTA for emergent large vessel occlusion, with proximal right M2/M3 occlusion. 2. Otherwise wide patency of the major arterial vasculature of the head and neck. These results were communicated to Dr. Lorraine Lax At 2:28 amon 7/10/2020by text page via the [facility] messaging system. Results again discussed with Dr. Lorraine Lax at 2:50 a.m. Electronically Signed   By: Jeannine Boga M.D.   On: 07/03/2019 02:54   Mr Brain Wo Contrast  Result Date: 07/03/2019 CLINICAL DATA:  Septic emboli. Ventilator support. Follow-up intervention for right MCA embolus. EXAM: MRI HEAD WITHOUT CONTRAST TECHNIQUE: Multiplanar, multiecho pulse  sequences of the brain and surrounding structures were obtained without intravenous contrast. COMPARISON:  CT study same day.  MRI 06/10/2019 FINDINGS: Brain: Multiple areas of acute infarction demonstrated today. Small acute infarctions seen in June have lost restricted diffusion as expected. Developing restricted diffusion in the right parietal cortical region served by the previously occluded  middle cerebral branch. Acute infarction of the cingulate gyrus on the right served by an occluded pericallosal artery on the right. Numerous scattered punctate foci of acute infarction in both parietal regions left more than right consistent with micro embolic infarctions. No evidence of mass effect or shift at this time. No hydrocephalus. No extra-axial collection. No MR evidence of hemorrhage. Signal loss related to the occluded right pericallosal artery. Vascular: Major vessels at the base of the brain show flow. Skull and upper cervical spine: Negative Sinuses/Orbits: Clear/normal Other: None IMPRESSION: Multiple areas of acute infarction as outlined above. This includes the right parietal region served by the previously occluded branch and the right cingulate gyrus served by the right pericallosal artery. Numerous punctate acute infarctions in both parietal regions left more than right likely due to micro emboli. Electronically Signed   By: Nelson Chimes M.D.   On: 07/03/2019 10:34   Ct Cerebral Perfusion W Contrast  Result Date: 07/03/2019 CLINICAL DATA:  Initial evaluation for acute stroke, left-sided deficits. EXAM: CT PERFUSION BRAIN TECHNIQUE: Multiphase CT imaging of the brain was performed following IV bolus contrast injection. Subsequent parametric perfusion maps were calculated using RAPID software. CONTRAST:  44mL OMNIPAQUE IOHEXOL 350 MG/ML SOLN COMPARISON:  Prior CT and CTA from earlier same day. FINDINGS: CT Brain Perfusion Findings: CBF (<30%) Volume: 8mL Perfusion (Tmax>6.0s) volume: 160mL Mismatch Volume: 39mL ASPECTS on noncontrast CT Head: 10 at time today. Infarct Core: 33 mL Infarction Location:Acute core infarct involving the mid and posterior right centrum semi ovale as well as the overlying posterior right frontal and right parietal cortex. IMPRESSION: Positive CT perfusion with acute core infarct involving the mid and posterior right MCA territory, estimated volume 33 cc.  Moderate-sized surrounding penumbra. Electronically Signed   By: Jeannine Boga M.D.   On: 07/03/2019 02:56   Dg Chest Portable 1 View  Result Date: 07/03/2019 CLINICAL DATA:  Intubation.  Stroke.  History of lung cancer. EXAM: PORTABLE CHEST 1 VIEW COMPARISON:  CT 06/20/2019.  Chest x-ray 05/21/2019. FINDINGS: Endotracheal tube tip noted 5 cm above the carina. Heart size normal. Prominent right mid lung field infiltrate again noted without interim change. No pleural effusion or pneumothorax. No acute bony abnormality. IMPRESSION: 1.  Endotracheal tube noted with its tip 5 cm above the carina. 2. Prominent right mid lung field infiltrate again noted without interim change. Electronically Signed   By: Marcello Moores  Register   On: 07/03/2019 07:41   Dg Abd Portable 1v  Result Date: 07/03/2019 CLINICAL DATA:  Orogastric tube placement. EXAM: PORTABLE ABDOMEN - 1 VIEW COMPARISON:  None. FINDINGS: The bowel gas pattern is normal. Distal tip of nasogastric tube is seen in proximal stomach. Residual contrast is seen in ureters and kidneys. No radio-opaque calculi or other significant radiographic abnormality are seen. IMPRESSION: Nasogastric tube seen in proximal stomach. No evidence of bowel obstruction or ileus. Electronically Signed   By: Marijo Conception M.D.   On: 07/03/2019 09:03   Vas [place] Transcranial Doppler W Bubbles  Result Date: 07/03/2019  Transcranial Doppler with Bubble Indications: Stroke. Performing Technologist: Maudry Mayhew MHA, RDMS, RVT, RDCS  Examination Guidelines: A complete evaluation includes B-mode  imaging, spectral Doppler, color Doppler, and power Doppler as needed of all accessible portions of each vessel. Bilateral testing is considered an integral part of a complete examination. Limited examinations for reoccurring indications may be performed as noted.  Summary:  A vascular evaluation was performed. The left middle cerebral artery was studied. An IV was inserted into the  patient's right antecubital fossa. Verbal informed consent was obtained.  No evidence of HITS (high intensity transient signals) at rest or with manual valsalva maneuver. Therefore, there is no evidence of clinically significant PFO (patent foramen ovale). *See table(s) above for measurements and observations.    Preliminary    Ct Head Code Stroke Wo Contrast  Result Date: 07/03/2019 CLINICAL DATA:  Code stroke. Initial evaluation for acute stroke, left-sided deficits. EXAM: CT ANGIOGRAPHY HEAD AND NECK TECHNIQUE: Multidetector CT imaging of the head and neck was performed using the standard protocol during bolus administration of intravenous contrast. Multiplanar CT image reconstructions and MIPs were obtained to evaluate the vascular anatomy. Carotid stenosis measurements (when applicable) are obtained utilizing NASCET criteria, using the distal internal carotid diameter as the denominator. CONTRAST:  20mL OMNIPAQUE IOHEXOL 350 MG/ML SOLN COMPARISON:  Prior MRI from 06/10/2019 FINDINGS: CT HEAD FINDINGS Brain: Examination technically limited by positioning and motion artifact. Age-related cerebral atrophy. No acute intracranial hemorrhage. No acute large vessel territory infarct. No mass lesion or midline shift. No hydrocephalus. No extra-axial fluid collection. Vascular: No hyperdense vessel. Skull: Scalp soft tissues and calvarium within normal limits. Sinuses: Paranasal sinuses are largely clear.  No mastoid effusion. Orbits: Globes and orbital soft tissues demonstrate no acute finding. Right gaze noted. Review of the MIP images confirms the above findings CTA NECK FINDINGS Aortic arch: Mild atheromatous plaque about the visualized aortic arch. Origin of the great vessels not assessed on this exam. Visualized subclavian arteries widely patent. Right carotid system: Right common and internal carotid arteries widely patent without stenosis, dissection, or occlusion. Left carotid system: Visualized left common  carotid artery widely patent. No significant atheromatous narrowing about the left bifurcation. Left ICA widely patent to the skull base without stenosis, dissection, or occlusion. Vertebral arteries: Both vertebral arteries arise from the subclavian arteries. Right vertebral artery dominant. Vertebral arteries widely patent within the neck without stenosis, dissection, or occlusion. Skeleton: No acute osseous finding. Scattered predominantly subcentimeter sclerotic foci within the visualized spine, nonspecific. These did not appear to be PET avid on prior PET-CT from 05/06/2019. Other neck: No other acute soft tissue abnormality within the neck. Upper chest: Visualized upper chest demonstrates no acute finding. Review of the MIP images confirms the above findings CTA HEAD FINDINGS Anterior circulation: Evaluation of the intracranial circulation mildly limited by timing of the contrast bolus, with suboptimal opacification of the intracranial arterial vasculature. Internal carotid arteries widely patent to the termini without stenosis. ICA termini well perfused. A1 segments patent bilaterally. Left A1 hypoplastic, accounting for the slightly diminutive left ICA is compared to the right. Normal anterior communicating artery. Anterior cerebral arteries widely patent to their distal aspects. Right M1 and right MCA bifurcation patent. Although difficult to be certain, there is question of a proximal right M2 or possibly M3 branch out, best seen on sagittal sequence (series 9, image 53). Remainder of the right MCA branches appears to be patent. Left M1 and left MCA bifurcation patent. Distal left MCA branches well perfused. Posterior circulation: Dominant right vertebral artery widely patent to the vertebrobasilar junction. Right PICA grossly patent proximally. Hypoplastic left vertebral artery terminates in  PICA. Left PICA patent. Basilar widely patent to its distal aspect. Superior cerebellar arteries patent  bilaterally. PCAs both widely patent to their distal aspects. Venous sinuses: Grossly patent allowing for timing of the contrast bolus. Anatomic variants: Hypoplastic left vertebral artery terminating in PICA. Delayed phase: Not performed. Review of the MIP images confirms the above findings IMPRESSION: 1. Positive CTA for emergent large vessel occlusion, with proximal right M2/M3 occlusion. 2. Otherwise wide patency of the major arterial vasculature of the head and neck. These results were communicated to Dr. Lorraine Lax At 2:28 amon 7/10/2020by text page via the [facility] messaging system. Results again discussed with Dr. Lorraine Lax at 2:50 a.m. Electronically Signed   By: Jeannine Boga M.D.   On: 07/03/2019 02:54   Cerebral angiogram S/O RT common carotid arteriogram followed complete revascularization of occluded MCA dominant mid division with x2 passes with 31mm x 21mm embotrap and x 3 passes with solitaire x 4 x 40 mm retriever device achieving a TICI 3 revascularization. Also partial revascularization of occluded ACA A2 A3 region with x 1 pass with solitaire X 17mmx 40 mm device.  2D Echocardiogram  06/30/2019  1. The left ventricle has normal systolic function with an ejection fraction of 60-65%. The cavity size was normal. Left ventricular diastolic Doppler parameters are consistent with impaired relaxation. No evidence of left ventricular regional wall  motion abnormalities.  2. The right ventricle has normal systolic function. The cavity was normal. There is no increase in right ventricular wall thickness.  3. Trivial pericardial effusion is present.  4. No evidence of mitral valve stenosis. Mild mitral regurgitation.  5. The aortic valve is tricuspid. No stenosis of the aortic valve.  6. Normal IVC size. No complete TR doppler jet so unable to estimate PA systolic pressure.  PHYSICAL EXAM Middle-aged Caucasian male sedated intubated.  Not in distress.  . Afebrile. Head is nontraumatic. Neck is supple  without bruit.    Cardiac exam no murmur or gallop. Lungs are clear to auscultation. Distal pulses are well felt. Neurological Exam :  Patient is  Intubated, weaning, not sedated, NAD.  He opens eyes to voice, alert.  He has right gaze preference.  He can look to midline and cross it.  Pupils are equal reactive.  Fundi not visualized.  Blinks to threat on the right but not on the left.  His right lower facial weakness.  Tongue midline.  He has dense left hemiplegia and does not withdraw even to painful stimuli.  He has purposeful antigravity movement on the right.  He has left-sided neglect.  He withdraws to pain on the right but not on the left.  Right plantars downgoing left upgoing. Left UE tone flaccid, left LE nml tone.  ASSESSMENT/PLAN Kyle Garcia is a 68 y.o. male with history of DVT on Xarelto, adenocarcinoma of the right lung, hyperlipidemia, prior strokes noted on MRI presenting with L sided weakness and R gaze. No tPA d/t xarelto.   Stroke:   R MCA/ACA infarct s/p IR with resultant left hemiplegia and neglect and gaze palsy- infarct embolic secondary to hypercoagulable state from lung cancer on Xarelto for DVT since April vs paradoxical embolism vs marantic endocarditis  Code Stroke CT head No acute abnormality. Atrophy. ASPECTS 10.     CTA head & neck ELVO prox R M2/M3 occlusion  CT perfusion acute core infarct mid and posterior R MCA territory, vol 33cc. Mod-sized penumbra  Cerebral angio R MCA occlusion, TICI3 revascularization w/ 2 passes embotrap and  3 passes solitaire; partial revascularization occluded ACA A2 A3 w/ 1 pass solitaire  Post IR CT slight R SAH in R perisylvian fissure and anterior interhemispheric fissure. No gross mass effect.  MRI  Multiple infarcts R parietal and R cingulate gyrus. Numerous punctate L>R parietal due to small emboli  2D Echo EF 60-65%. No source of embolus   TCD w/ bubble pending   Consider TEE to rule or marantic endocarditis  LDL  pending   HgbA1c pending   SCDs for VTE prophylaxis  Xarelto (rivaroxaban) daily prior to admission, now on No antithrombotic given possible post IR hemorrhage. Add/resume AC/antithrombotic based on MRI results   Therapy recommendations:  pending   Disposition:  pending   Respiratory Failure  Intubated for IR  Left intubated post IR  R leg DVT/SVT  Confirmed by doppler 04/15/2019 and 06/20/2019  On xarelto  Stage 4 adenocarcinoma R lung  On chemo  Hyperlipidemia  Home meds:  pravachol 20  LDL pending, goal < 70  Resume statin based on LDL finding  Dysphagia . Secondary to stroke . NPO   Other Stroke Risk Factors  Advanced age  Former Cigarette smoker, quit 4 mos ago, advised to stop smoking  ETOH use, advised to drink no more than 2 drink(s) a day  UDS positive for opiates - on hydrocodone at home   Hx stroke/TIA  Other Active Problems  GERD  Hospital day # 1  Personally examined patient and images, and have participated in and made any corrections needed to history, physical, neuro exam,assessment and plan as stated above.  I have personally obtained the history, evaluated lab date, reviewed imaging studies and agree with radiology interpretations.   He presented with sudden onset of left hemiplegia and neglect and field loss secondary right MCA infarct from right M2 occlusion and underwent successful mechanical thrombectomy but clinical exam so far does not show significant improvement. Underlying lung cancer.  MRI scan shows multiple infarcts involving right parietal, right cingulate gyrus, tiny punctate infarcts in the left parietal area as well.   He is on IV heparin drip.Marland Kitchen  Discussed with Dr. Lynetta Mare critical care medicine and Dr. Leonie Man discussed with Dr. Mickeal Skinner patient's neuro oncologist.  He will likely need TEE to look for marantic endocarditis and TCD bubble study to look for PFO for paradoxical embolism.  Will likely switch his anticoagulation  from Xarelto to Pradaxa given failure of Xarelto.    Sarina Ill, MD Stroke Neurology   A total of 25 minutes was spent for the care of this patient, spent on counseling patient and family on different diagnostic and therapeutic options, counseling and coordination of care, riskd ans benefits of management, compliance, or risk factor reduction and education.  To contact Stroke Continuity provider, please refer to [email or web address] After hours, contact General Neurology

## 2019-07-04 NOTE — Progress Notes (Signed)
SLP Cancellation Note  Patient Details Name: Kyle Garcia MRN: [id number] DOB: 06/09/1951   Cancelled treatment:       Reason Eval/Treat Not Completed: Patient not medically ready. Remains intubated.   Gabriel Rainwater MA, CCC-SLP     Kyle Garcia Meryl 07/04/2019, 9:50 AM

## 2019-07-04 NOTE — Progress Notes (Signed)
ANTICOAGULATION CONSULT NOTE - Follow-up Consult  Pharmacy Consult for IV Heparin  Indication: DVT and stroke  No Known Allergies  Patient Measurements: Height: 5\' 11"  (180.3 cm) Weight: 148 lb 5.9 oz (67.3 kg) IBW/kg (Calculated) : 75.3 Heparin Dosing Weight: 67.3 kg  Vital Signs: Temp: 98.6 F (37 C) (07/11 0900) Temp Source: Bladder (07/11 0800) BP: 107/65 (07/11 0900) Pulse Rate: 104 (07/11 0900)  Labs: Recent Labs    07/01/19 1059  07/03/19 0205 07/03/19 0213 07/03/19 0828 07/03/19 1633 07/04/19 0051 07/04/19 0544 07/04/19 0815  HGB 10.3*   < > 9.6* 9.9* 7.5*  --   --  7.7*  --   HCT 33.4*  --  30.5* 29.0* 22.0*  --   --  24.6*  --   PLT 215  --  256  --   --   --   --  245  --   APTT  --    < > 51*  --   --  38* 39*  --  53*  LABPROT  --   --  35.1*  --   --   --   --   --   --   INR  --   --  3.6*  --   --   --   --   --   --   HEPARINUNFRC  --   --   --   --   --   --   --   --  0.62  CREATININE 0.86  --  0.89 0.70  --   --   --  0.68  --    < > = values in this interval not displayed.    Estimated Creatinine Clearance: 85.3 mL/min (by C-G formula based on SCr of 0.68 mg/dL).   Medical History: Past Medical History:  Diagnosis Date  . Cancer ([place])    stage 4 adenocarcinoma right lung  . GERD (gastroesophageal reflux disease)   . Hyperlipidemia   . Stroke ([place])    25-30 emboli seen on screening MRI    Medications:  Infusions:  . sodium chloride    . sodium chloride 500 mL/hr at 07/03/19 1033  . sodium chloride    . heparin 800 Units/hr (07/04/19 0900)  . phenylephrine (NEO-SYNEPHRINE) Adult infusion    . propofol (DIPRIVAN) infusion Stopped (07/04/19 0744)    Assessment: 68 year old male admitted as code stroke on 7/10 early am - no tPA as was on Xarelto prior to admission for recent DVT with last dose on 7/9 at 1700 PM. Patient was found to have R M2 M3 occlusion and went to IR for mechanical thrombectomy around 6 AM this morning. Sheath  remains in place due to recent Xarelto with plans to remove tomorrow morning. Dr. Leonie Man requested to start IV Heparin drip per pharmacy protocol once safe post last Xarelto dose due to RLE DVT (LE Dopplers 06/20/19).   Last Xarelto dose on 07/02/19 at 1700 PM (Day #15 of start pack - still on BID dosing). *Heparin level will likely be elevated in setting of recent Xarelto - will be utilizing aPTTs with lower goal of 40-60sec while sheath in tonight to monitor IV Heparin until heparin levels and  aPTTs are correlating.   Update 7/11 AM: Sheath was removed at 5PM on 7/10. Heparin currently running at 800 units/hr. APTT is 53 this AM -therapeutic for low goal but sub-therapeutic for increased goal of 0.3 to 0.5. Per discussion with Dr. Leonie Man yesterday, to  target higher goal of 0.3 to 0.5 (or aPTT ~66-85) now that sheath removed.  Heparin level is high at 0.62 (but this is due to recent Xarelto) - currently using aPTT.  Hgb 7.7 - stable. Platelets stable.   Goal of Therapy:  Heparin level 0.3 to 0.5 units/ml aPTT 66-85 seconds Monitor platelets by anticoagulation protocol: Yes    Plan:  Increase Heparin to 900 units/hr  Recheck APTT in 6 hours.  Monitor for any signs of bleeding and daily CBC.  Daily APTT and Heparin level until levels correlating.   Sloan Leiter, PharmD, BCPS, BCCCP Clinical Pharmacist Please refer to [facility] for [place] numbers 07/04/2019 9:52 AM

## 2019-07-04 NOTE — Progress Notes (Signed)
Rehab Admissions Coordinator Note:  Per PT recommendation, this patient was screened by Jhonnie Garner for appropriateness for an Inpatient Acute Rehab Consult. Noted pt recently extubated today. Will follow for one additional day and if pt remains stable will contact MD to request an IP Rehab Consult Order.   Jhonnie Garner 07/04/2019, 4:41 PM  I can be reached at [phone].

## 2019-07-04 NOTE — Progress Notes (Signed)
NAME:  Kyle Garcia, MRN:  [id number], DOB:  20-May-1951, LOS: 1 ADMISSION DATE:  07/03/2019, CONSULTATION DATE:  07/03/2019 REFERRING MD:  Dr. Antony Contras, CHIEF COMPLAINT:  Acute Ischemic R MCA Stroke   Brief History   68yo M with history of R DVT on Xarelto, adenocarcinoma of the R lung, prior strokes on imaging, HLD, and GERD, who p/w left-sided hemiplegia and R gaze deviation. Found to have R MCA and ACA occlusions. Now s/p mechanical thrombectomy with VIR on 7/10.   Past Medical History   Past Medical History:  Diagnosis Date  . Cancer ([place])    stage 4 adenocarcinoma right lung  . GERD (gastroesophageal reflux disease)   . Hyperlipidemia   . Stroke [facility])    25-30 emboli seen on screening MRI    Significant Hospital Events   7/10 - Admission, Thrombectomy w/ IR, Transfer to ICU Extubated 7/11. Consults:  VIR PCCM  Procedures:  7/10 - VIR Mechanical Thromebctomy  Significant Diagnostic Tests:  MRI Brain 6/17: 25-30 micro emboli vs mets within L cerebellum and both cerebral hemispheres  CTA Head/Neck 7/10: Proximal R M2/M3 occlusion. Internal carotids without stenosis.   MRI Brain 7/10: Multiple areas of acute infarction in R parietal region and R cingulate gyrus. Numerous punctate acute infarctions on both parietal regions L>R, likely 2/2 micro emboli.  Micro Data:  7/10 - SARS CoV2 & MRSA: Negative  Antimicrobials:  N/A   Interim history/subjective:  Patient somnolent on propofol but able to follow commands.  Objective   Blood pressure 133/73, pulse (!) 114, temperature 98.8 F (37.1 C), temperature source Bladder, resp. rate (!) 22, height 5\' 11"  (1.803 m), weight 67.3 kg, SpO2 100 %.    Vent Mode: PSV;CPAP FiO2 (%):  [40 %] 40 % Set Rate:  [16 bmp] 16 bmp Vt Set:  [600 mL] 600 mL PEEP:  [5 cmH20] 5 cmH20 Pressure Support:  [8 cmH20] 8 cmH20 Plateau Pressure:  [13 cmH20-17 cmH20] 17 cmH20   Intake/Output Summary (Last 24 hours) at 07/04/2019 1225 Last  data filed at 07/04/2019 1200 Gross per 24 hour  Intake 582.26 ml  Output 985 ml  Net -402.74 ml   Filed Weights   07/03/19 0304  Weight: 67.3 kg    Examination: General: Well-developed Caucasian male in NAD. Intubated and sedated HENT: PERRL, conjunctiva clear, MMM. ET tube in place. Lungs: CTA anteriorly. Normal WOB on vent. Passed SBT. Cardiovascular: RRR, no m/r/g. No carotid bruits. Abdomen: Soft, non-distended. +BS Extremities: 2+ RLE pitting edema. Palpable radial and L DP pulses. R DP pulse difficult to assess 2/2 swelling. Cap refill <3s throughout. Neuro: Sedated and somnolent. Opens eyes briefly to voice. R gaze preference with no movement past midline. Following commands on R side only. No response to noxious stimuli on L side. Flaccid LUE, normal tone LUE.  GU: Foley in place, draining amber-olored urine  Resolved Hospital Problem list   N/A  Assessment & Plan:   Critically ill due respiratory failure requiring mechanical ventilation Passed SBT. -Extubate today. -At risk for re-intubation as swallowing not yet assessed.  Acute R MCA & ACA Infarction s/p Thrombectomy: - s/p mechanical thrombectomy with VIR on 7/10 - Neurology following, appreciate recs - SBP goal 120-140 - Statin as below - Neuro checks - initiate anticoagulation with heparin. - f/u Lipid Profile and HgbA1C - Consider TEE to better eval for marantic endocarditis  RLE DVT: - RLE U/S 6/27 - acute DVT of R common femoral vein - Xarelto held on  admission given initial c/f post-procedural hemorrhage. Plan to restart appropriate anticoagulation given MRI without evidence of hemorrhage. Plan switch to Pradaxa.  Hyperlipidemia - Switch to high potency statin.   Adenocarcinoma of R Lung - On chemotherapy (s 6/24)  Best practice:  Diet: NPO - swallow evaluation. Pain/Anxiety/Delirium protocol (if indicated):  VAP protocol (if indicated): per protocol DVT prophylaxis: IV heparin. GI  prophylaxis: PPI Glucose control: per protocol Mobility: Bedrest Code Status: Full Family Communication: Will reach out to wife Disposition: ICU  Labs   CBC: Recent Labs  Lab 07/01/19 1059 07/03/19 0205 07/03/19 0213 07/03/19 0828 07/04/19 0544  WBC 4.6 6.1  --   --  7.2  NEUTROABS 3.1 4.3  --   --  6.0  HGB 10.3* 9.6* 9.9* 7.5* 7.7*  HCT 33.4* 30.5* 29.0* 22.0* 24.6*  MCV 85.6 83.8  --   --  86.6  PLT 215 256  --   --  009    Basic Metabolic Panel: Recent Labs  Lab 07/01/19 1059 07/03/19 0205 07/03/19 0213 07/03/19 0828 07/04/19 0544  NA 136 133* 133* 135 136  K 4.6 3.5 3.3* 3.7 3.6  CL 102 100 99  --  106  CO2 27 22  --   --  20*  GLUCOSE 93 124* 128*  --  103*  BUN 16 16 15   --  12  CREATININE 0.86 0.89 0.70  --  0.68  CALCIUM 8.4* 8.2*  --   --  7.8*   GFR: Estimated Creatinine Clearance: 85.3 mL/min (by C-G formula based on SCr of 0.68 mg/dL). Recent Labs  Lab 07/01/19 1059 07/03/19 0205 07/04/19 0544  WBC 4.6 6.1 7.2    Liver Function Tests: Recent Labs  Lab 07/01/19 1059 07/03/19 0205  AST 15 23  ALT 16 14  ALKPHOS 131* 109  BILITOT 0.5 0.6  PROT 6.4* 5.5*  ALBUMIN 2.8* 2.7*   No results for input(s): LIPASE, AMYLASE in the last 168 hours. No results for input(s): AMMONIA in the last 168 hours.  ABG    Component Value Date/Time   PHART 7.424 07/03/2019 0828   PCO2ART 34.1 07/03/2019 0828   PO2ART 224.0 (H) 07/03/2019 0828   HCO3 22.3 07/03/2019 0828   TCO2 23 07/03/2019 0828   ACIDBASEDEF 2.0 07/03/2019 0828   O2SAT 100.0 07/03/2019 0828     Coagulation Profile: Recent Labs  Lab 07/03/19 0205  INR 3.6*    Cardiac Enzymes: No results for input(s): CKTOTAL, CKMB, CKMBINDEX, TROPONINI in the last 168 hours.  HbA1C: Hgb A1c MFr Bld  Date/Time Value Ref Range Status  07/03/2019 05:00 AM 5.7 (H) 4.8 - 5.6 % Final    Comment:    (NOTE)         Prediabetes: 5.7 - 6.4         Diabetes: >6.4         Glycemic control for  adults with diabetes: <7.0   12/02/2018 09:06 AM 5.3 <5.7 % of total Hgb Final    Comment:    For the purpose of screening for the presence of diabetes: . <5.7%       Consistent with the absence of diabetes 5.7-6.4%    Consistent with increased risk for diabetes             (prediabetes) > or =6.5%  Consistent with diabetes . This assay result is consistent with a decreased risk of diabetes. . Currently, no consensus exists regarding use of hemoglobin A1c for diagnosis of diabetes  in children. . According to American Diabetes Association (ADA) guidelines, hemoglobin A1c <7.0% represents optimal control in non-pregnant diabetic patients. Different metrics may apply to specific patient populations.  Standards of Medical Care in Diabetes(ADA). .     CBG: Recent Labs  Lab 07/03/19 0200  GLUCAP 116*   CRITICAL CARE Performed by: Kipp Brood  Total critical care time: 40 minutes  Critical care time was exclusive of separately billable procedures and treating other patients.  Critical care was necessary to treat or prevent imminent or life-threatening deterioration.  Critical care was time spent personally by me on the following activities: development of treatment plan with patient and/or surrogate as well as nursing, discussions with consultants, evaluation of patient's response to treatment, examination of patient, obtaining history from patient or surrogate, ordering and performing treatments and interventions, ordering and review of laboratory studies, ordering and review of radiographic studies, pulse oximetry, re-evaluation of patient's condition and participation in multidisciplinary rounds.  Kipp Brood, MD [facility] ICU Physician [place]  Pager: [phone] Mobile: [phone] After hours: [phone].   07/04/19

## 2019-07-05 ENCOUNTER — Inpatient Hospital Stay (HOSPITAL_COMMUNITY): Payer: Medicare Other

## 2019-07-05 DIAGNOSIS — Z86718 Personal history of other venous thrombosis and embolism: Secondary | ICD-10-CM

## 2019-07-05 DIAGNOSIS — I6340 Cerebral infarction due to embolism of unspecified cerebral artery: Secondary | ICD-10-CM

## 2019-07-05 DIAGNOSIS — D6859 Other primary thrombophilia: Secondary | ICD-10-CM

## 2019-07-05 DIAGNOSIS — C3491 Malignant neoplasm of unspecified part of right bronchus or lung: Secondary | ICD-10-CM

## 2019-07-05 DIAGNOSIS — I639 Cerebral infarction, unspecified: Secondary | ICD-10-CM

## 2019-07-05 DIAGNOSIS — I63511 Cerebral infarction due to unspecified occlusion or stenosis of right middle cerebral artery: Secondary | ICD-10-CM

## 2019-07-05 LAB — CBC
HCT: 23.5 % — ABNORMAL LOW (ref 39.0–52.0)
Hemoglobin: 7.4 g/dL — ABNORMAL LOW (ref 13.0–17.0)
MCH: 26.8 pg (ref 26.0–34.0)
MCHC: 31.5 g/dL (ref 30.0–36.0)
MCV: 85.1 fL (ref 80.0–100.0)
Platelets: 236 10*3/uL (ref 150–400)
RBC: 2.76 MIL/uL — ABNORMAL LOW (ref 4.22–5.81)
RDW: 15.6 % — ABNORMAL HIGH (ref 11.5–15.5)
WBC: 5.9 10*3/uL (ref 4.0–10.5)
nRBC: 0 % (ref 0.0–0.2)

## 2019-07-05 LAB — BASIC METABOLIC PANEL
Anion gap: 9 (ref 5–15)
BUN: 15 mg/dL (ref 8–23)
CO2: 19 mmol/L — ABNORMAL LOW (ref 22–32)
Calcium: 7.9 mg/dL — ABNORMAL LOW (ref 8.9–10.3)
Chloride: 109 mmol/L (ref 98–111)
Creatinine, Ser: 0.8 mg/dL (ref 0.61–1.24)
GFR calc Af Amer: >60 mL/min (ref >60)
GFR calc non Af Amer: >60 mL/min (ref >60)
Glucose, Bld: 85 mg/dL (ref 70–99)
Potassium: 3.1 mmol/L — ABNORMAL LOW (ref 3.5–5.1)
Sodium: 137 mmol/L (ref 135–145)

## 2019-07-05 LAB — HEPARIN LEVEL (UNFRACTIONATED)
Heparin Unfractionated: 0.29 IU/mL — ABNORMAL LOW (ref 0.30–0.70)
Heparin Unfractionated: 0.28 IU/mL — ABNORMAL LOW (ref 0.30–0.70)

## 2019-07-05 LAB — APTT: aPTT: 70 seconds — ABNORMAL HIGH (ref 24–36)

## 2019-07-05 MED ORDER — HEPARIN (PORCINE) 25000 UT/250ML-% IV SOLN
1300.0000 [IU]/h | INTRAVENOUS | Status: AC
  Administered 2019-07-05: 1100 [IU]/h via INTRAVENOUS
  Administered 2019-07-05: 1200 [IU]/h via INTRAVENOUS
  Filled 2019-07-05: qty 250

## 2019-07-05 MED ORDER — POTASSIUM CHLORIDE 10 MEQ/100ML IV SOLN
10.0000 meq | INTRAVENOUS | Status: AC
  Administered 2019-07-05 (×3): 10 meq via INTRAVENOUS
  Filled 2019-07-05 (×3): qty 100

## 2019-07-05 MED ORDER — ROSUVASTATIN CALCIUM 20 MG PO TABS
20.0000 mg | ORAL_TABLET | Freq: Every day | ORAL | Status: DC
  Administered 2019-07-05 – 2019-07-09 (×5): 20 mg via ORAL
  Filled 2019-07-05 (×6): qty 1

## 2019-07-05 NOTE — Evaluation (Signed)
Occupational Therapy Evaluation Patient Details Name: Kyle Garcia MRN: [id number] DOB: Apr 25, 1951 Today's Date: 07/05/2019    History of Present Illness Patient is a 68 y/o male who presents with left sided weakness. Head CT-Positive CTA for emergent large vessel occlusion, with proximal right M2/M3 occlusion. Brain MRI- large right MCA/ACA infarct with smaller infarcts consistent with embolic phenomenon (09-32 infarcts seen). Now s/p mechanical thrombectomy on 7/10. Recent diagnosis of RLE DVT (6/27) on Xarelto, HLD and stage IV adenocarcinoma of the lung in May 2020.   Clinical Impression   This 68 yo male admitted with above presents to acute OT with left inattention, right gaze preference, no AROM on command noted for LUE (spontaneous tonal movements seen intermittently), decreased sitting balance all affecting his safety and independence with basic ADLs. He will benefit from acute OT with follow up on CIR.    Follow Up Recommendations  CIR;Supervision/Assistance - 24 hour    Equipment Recommendations  Other (comment)(TBD next venue)       Precautions / Restrictions Precautions Precautions: Fall Restrictions Weight Bearing Restrictions: No      Mobility Bed Mobility Overal bed mobility: Needs Assistance Bed Mobility: Supine to Sit     Supine to sit: Max assist;+2 for physical assistance     General bed mobility comments: Pt reached with RUE for bed rail, but missed and then started reaching for me--I had re-oriented his RUE to upper bed rail, he then moved his RLE towards EOB; pt needed A for rest of coming up to sit (LLE, LUE, trunk)  Transfers Overall transfer level: Needs assistance   Transfers: Lateral/Scoot Transfers;Sit to/from Stand Sit to Stand: Mod assist(partial stand to place new alarm pad under him)        Lateral/Scoot Transfers: +2 physical assistance;Mod assist General transfer comment: bed>drop arm reclier going to pt's right    Balance Overall  balance assessment: Needs assistance Sitting-balance support: Single extremity supported;Feet supported Sitting balance-Leahy Scale: Poor Sitting balance - Comments: While seated EOB, pt at times reaching too far to right and leaning too forward. Had him work on leaning forward and back as well as coming on left forarm and right forearms --alternately and then back up to midline (he needed increased cues, min A occassionally, and tactile cues)                                   ADL either performed or assessed with clinical judgement   ADL Overall ADL's : Needs assistance/impaired Eating/Feeding: NPO Eating/Feeding Details (indicate cue type and reason): But asking for something to drink at least 3 times during session Grooming: Wash/dry face;Minimal assistance Grooming Details (indicate cue type and reason): using [place] on his own Upper Body Bathing: Moderate assistance;Bed level   Lower Body Bathing: Total assistance;Bed level   Upper Body Dressing : Maximal assistance;Bed level   Lower Body Dressing: Total assistance;Bed level   Toilet Transfer: Moderate assistance;+2 for physical assistance Toilet Transfer Details (indicate cue type and reason): lateral scoot from bed to drop arm recliner going to pt's right Toileting- Clothing Manipulation and Hygiene: Total assistance Toileting - Clothing Manipulation Details (indicate cue type and reason): Max A partial stand             Vision   Vision Assessment?: Vision impaired- to be further tested in functional context Additional Comments: Pt with right gaze preference. with cues he can get a little past midline to  left but does not maintain            Pertinent Vitals/Pain Pain Assessment: No/denies pain Faces Pain Scale: No hurt     Hand Dominance Right   Extremity/Trunk Assessment Upper Extremity Assessment LUE Deficits / Details: No active movement LUE. Increased intermittent tone noted in LUE, noted  some edema in hand LUE Sensation: decreased light touch(decreased pinch)           Communication Communication Communication: No difficulties   Cognition Arousal/Alertness: Awake/alert Behavior During Therapy: Flat affect Overall Cognitive Status: Impaired/Different from baseline Area of Impairment: Safety/judgement;Following commands;Awareness;Problem solving;Attention                   Current Attention Level: Sustained   Following Commands: Follows one step commands with increased time;Follows one step commands inconsistently Safety/Judgement: Decreased awareness of deficits;Decreased awareness of safety Awareness: Emergent Problem Solving: Decreased initiation;Difficulty sequencing;Requires verbal cues;Requires tactile cues General Comments: A&Ox4. Pt with right gaze preference.Minimal awareness of left LLE but not LUE.              Home Living Family/patient expects to be discharged to:: Inpatient rehab Living Arrangements: Spouse/significant other Available Help at Discharge: Family Type of Home: House Home Access: Stairs to enter [employer] of Steps: 2 Entrance Stairs-Rails: Right Home Layout: One level                          Prior Functioning/Environment Level of Independence: Independent        Comments: Retired.        OT Problem List: Decreased strength;Decreased range of motion;Decreased activity tolerance;Impaired balance (sitting and/or standing);Impaired vision/perception;Decreased coordination;Decreased cognition;Decreased safety awareness;Decreased knowledge of use of DME or AE;Impaired sensation;Impaired tone;Impaired UE functional use;Increased edema      OT Treatment/Interventions: Self-care/ADL training;Neuromuscular education;Cognitive remediation/compensation;Visual/perceptual remediation/compensation;Patient/family education;DME and/or AE instruction;Balance training;Therapeutic exercise;Therapeutic  activities;Splinting    OT Goals(Current goals can be found in the care plan section) Acute Rehab OT Goals Patient Stated Goal: to get something to drink OT Goal Formulation: With patient Time For Goal Achievement: 07/19/19 Potential to Achieve Goals: Good  OT Frequency: Min 3X/week           Co-evaluation PT/OT/SLP Co-Evaluation/Treatment: Yes Reason for Co-Treatment: Complexity of the patient's impairments (multi-system involvement);For patient/therapist safety;Necessary to address cognition/behavior during functional activity PT goals addressed during session: Mobility/safety with mobility;Balance;Strengthening/ROM OT goals addressed during session: ADL's and self-care;Strengthening/ROM      AM-PAC OT "6 Clicks" Daily Activity     Outcome Measure Help from another person eating meals?: Total Help from another person taking care of personal grooming?: A Lot Help from another person toileting, which includes using toliet, bedpan, or urinal?: Total Help from another person bathing (including washing, rinsing, drying)?: A Lot Help from another person to put on and taking off regular upper body clothing?: A Lot Help from another person to put on and taking off regular lower body clothing?: Total 6 Click Score: 9   End of Session Equipment Utilized During Treatment: Gait belt Nurse Communication: Mobility status  Activity Tolerance: Patient tolerated treatment well Patient left: in chair;with call bell/phone within reach;with chair alarm set  OT Visit Diagnosis: Unsteadiness on feet (R26.81);Other abnormalities of gait and mobility (R26.89);Muscle weakness (generalized) (M62.81);Low vision, both eyes (H54.2);Other symptoms and signs involving cognitive function;Hemiplegia and hemiparesis Hemiplegia - Right/Left: Left Hemiplegia - dominant/non-dominant: Non-Dominant Hemiplegia - caused by: Cerebral infarction  Time: 3664-4034 OT Time Calculation (min): 37  min Charges:  OT General Charges $OT Visit: 1 Visit OT Evaluation $OT Eval Moderate Complexity: 1 Mod  [place] [address], OTR/L Acute [employer] Pager [phone] Office [phone]    Almon Register 07/05/2019, 12:21 PM

## 2019-07-05 NOTE — Progress Notes (Signed)
NAME:  Kyle Garcia, MRN:  [id number], DOB:  07/12/1951, LOS: 2 ADMISSION DATE:  07/03/2019, CONSULTATION DATE:  07/03/2019 REFERRING MD:  Dr. Antony Contras, CHIEF COMPLAINT:  Acute Ischemic R MCA Stroke   Brief History   68yo M with history of R DVT on Xarelto, adenocarcinoma of the R lung, prior strokes on imaging, HLD, and GERD, who p/w left-sided hemiplegia and R gaze deviation. Found to have R MCA and ACA occlusions. Now s/p mechanical thrombectomy with VIR on 7/10.   Past Medical History   Past Medical History:  Diagnosis Date  . Cancer ([place])    stage 4 adenocarcinoma right lung  . GERD (gastroesophageal reflux disease)   . Hyperlipidemia   . Stroke [facility])    25-30 emboli seen on screening MRI    Significant Hospital Events   7/10 - Admission, Thrombectomy w/ IR, Transfer to ICU Extubated 7/11. Consults:  VIR PCCM  Procedures:  7/10 - VIR Mechanical Thromebctomy  Significant Diagnostic Tests:  MRI Brain 6/17: 25-30 micro emboli vs mets within L cerebellum and both cerebral hemispheres  CTA Head/Neck 7/10: Proximal R M2/M3 occlusion. Internal carotids without stenosis.   MRI Brain 7/10: Multiple areas of acute infarction in R parietal region and R cingulate gyrus. Numerous punctate acute infarctions on both parietal regions L>R, likely 2/2 micro emboli.  Micro Data:  7/10 - SARS CoV2 & MRSA: Negative  Antimicrobials:  N/A   Interim history/subjective:  Extubated yesterday. No overnight events. Patient reports being thirsty.  Objective   Blood pressure 133/64, pulse (!) 126, temperature 98.6 F (37 C), temperature source Oral, resp. rate (!) 25, height 5\' 11"  (1.803 m), weight 67.3 kg, SpO2 96 %.        Intake/Output Summary (Last 24 hours) at 07/05/2019 0836 Last data filed at 07/05/2019 0700 Gross per 24 hour  Intake 1838.68 ml  Output 625 ml  Net 1213.68 ml   Filed Weights   07/03/19 0304  Weight: 67.3 kg    Examination: General: Well-developed  Caucasian male in NAD. Extubated HENT: PERRL, conjunctiva clear, oral mucosa dry, Poor saliva control. Aphonic voice.. Lungs: CTA anteriorly.  Cardiovascular: RRR, no m/r/g. No carotid bruits. Abdomen: Soft, non-distended. +BS Extremities: 2+ RLE pitting edema. Palpable radial and L DP pulses. R DP pulse difficult to assess 2/2 swelling. Cap refill <3s throughout. Neuro: Awake and appropriately interactive. PERL, right gaze preference. Left visual field neglect. L facial droop. LUE hemiplegia with decreased tone, brisk reflexes. Normal tone in LE with no movement.  GU: Foley in place, draining amber-olored urine  Resolved Hospital Problem list   Respiratory failure/  Assessment & Plan:    Acute R MCA & ACA Infarction s/p Thrombectomy: - s/p mechanical thrombectomy with VIR on 7/10 -Allow BP to autoregulate -Heparin with transition to Predaxa once able to take orals -Secondary stroke prevention - high potency statin -Stroke rehabilitation - swallow evaluation, may need feeding tube in interim.   RLE DVT: - RLE U/S 6/27 - acute DVT of R common femoral vein - Xarelto held on admission given initial c/f post-procedural hemorrhage. Plan to restart appropriate anticoagulation given MRI without evidence of hemorrhage. Plan switch to Pradaxa.  Hyperlipidemia - Switch to high potency statin.   Hypokalemia -replaced  Adenocarcinoma of R Lung - On chemotherapy (s 6/24) - has evidence of either micrometastases or multiple smaller strokes related to hypercoagulability.  Best practice:  Diet: NPO - swallow evaluation. Pain/Anxiety/Delirium protocol (if indicated):  VAP protocol (if indicated): per protocol  DVT prophylaxis: IV heparin. GI prophylaxis: PPI Glucose control: per protocol Mobility: Bedrest Code Status: Full Family Communication: updated on 7/11 Disposition: to PCU tomorrow.  Labs   CBC: Recent Labs  Lab 07/01/19 1059 07/03/19 0205 07/03/19 0213 07/03/19 0828  07/04/19 0544 07/05/19 0744  WBC 4.6 6.1  --   --  7.2 5.9  NEUTROABS 3.1 4.3  --   --  6.0  --   HGB 10.3* 9.6* 9.9* 7.5* 7.7* 7.4*  HCT 33.4* 30.5* 29.0* 22.0* 24.6* 23.5*  MCV 85.6 83.8  --   --  86.6 85.1  PLT 215 256  --   --  245 902    Basic Metabolic Panel: Recent Labs  Lab 07/01/19 1059 07/03/19 0205 07/03/19 0213 07/03/19 0828 07/04/19 0544 07/05/19 0744  NA 136 133* 133* 135 136 137  K 4.6 3.5 3.3* 3.7 3.6 3.1*  CL 102 100 99  --  106 109  CO2 27 22  --   --  20* 19*  GLUCOSE 93 124* 128*  --  103* 85  BUN 16 16 15   --  12 15  CREATININE 0.86 0.89 0.70  --  0.68 0.80  CALCIUM 8.4* 8.2*  --   --  7.8* 7.9*   GFR: Estimated Creatinine Clearance: 85.3 mL/min (by C-G formula based on SCr of 0.8 mg/dL). Recent Labs  Lab 07/01/19 1059 07/03/19 0205 07/04/19 0544 07/05/19 0744  WBC 4.6 6.1 7.2 5.9    Liver Function Tests: Recent Labs  Lab 07/01/19 1059 07/03/19 0205  AST 15 23  ALT 16 14  ALKPHOS 131* 109  BILITOT 0.5 0.6  PROT 6.4* 5.5*  ALBUMIN 2.8* 2.7*   No results for input(s): LIPASE, AMYLASE in the last 168 hours. No results for input(s): AMMONIA in the last 168 hours.  ABG    Component Value Date/Time   PHART 7.424 07/03/2019 0828   PCO2ART 34.1 07/03/2019 0828   PO2ART 224.0 (H) 07/03/2019 0828   HCO3 22.3 07/03/2019 0828   TCO2 23 07/03/2019 0828   ACIDBASEDEF 2.0 07/03/2019 0828   O2SAT 100.0 07/03/2019 0828     Coagulation Profile: Recent Labs  Lab 07/03/19 0205  INR 3.6*    Cardiac Enzymes: No results for input(s): CKTOTAL, CKMB, CKMBINDEX, TROPONINI in the last 168 hours.  HbA1C: Hgb A1c MFr Bld  Date/Time Value Ref Range Status  07/03/2019 05:00 AM 5.7 (H) 4.8 - 5.6 % Final    Comment:    (NOTE)         Prediabetes: 5.7 - 6.4         Diabetes: >6.4         Glycemic control for adults with diabetes: <7.0   12/02/2018 09:06 AM 5.3 <5.7 % of total Hgb Final    Comment:    For the purpose of screening for the  presence of diabetes: . <5.7%       Consistent with the absence of diabetes 5.7-6.4%    Consistent with increased risk for diabetes             (prediabetes) > or =6.5%  Consistent with diabetes . This assay result is consistent with a decreased risk of diabetes. . Currently, no consensus exists regarding use of hemoglobin A1c for diagnosis of diabetes in children. . According to American Diabetes Association (ADA) guidelines, hemoglobin A1c <7.0% represents optimal control in non-pregnant diabetic patients. Different metrics may apply to specific patient populations.  Standards of Medical Care in Diabetes(ADA). Marland Kitchen  CBG: Recent Labs  Lab 07/03/19 [place] Jailynne Opperman, MD [facility] ICU Physician [place]  Pager: [phone] Mobile: [phone] After hours: [phone].   07/05/19

## 2019-07-05 NOTE — Progress Notes (Signed)
Assisted tele visit to patient with family member.  Kyle Garcia M, RN  

## 2019-07-05 NOTE — Progress Notes (Signed)
Assisted tele visit to patient with family member.  Leila Schuff M, RN  

## 2019-07-05 NOTE — Progress Notes (Signed)
Modified Barium Swallow Progress Note  Patient Details  Name: Kyle Garcia MRN: [id number] Date of Birth: 07/18/1951  Today's Date: 07/05/2019  Modified Barium Swallow completed.  Full report located under Chart Review in the Imaging Section.  Brief recommendations include the following:  Clinical Impression  Pt presents with mild oropharyngeal dysphagia c/b reduced labial seal, delayed swallow initiation, reduced base of tongue retraction, and incomplete laryngeal closure.  These deficits resulted in frank, audible aspiration of thin liquid by straw, transient aspiration of thin liquid by cup, and transient penetration of nectar thick liquid. A chin tuck was beneficial with thin liquid to prevent aspiration.  Chin tuck should only be used with cup sips of thin liquid.  With straw siphoning used with chin tuck, there was aspiration before the swallow.  There was a single instance of transient penetration of thick liquid with use of chin tuck.  There was anterior loss of regular solid and prolonged mastication with delayed bolus formation and transit, but pt achieved adequate oral clearance with additional time.  There was mild pharyngeal residue with puree which was cleared with subsequent swallows. Recommend mechanical soft diet with thin liquids by cup with use of chin tuck.   SLP to follow for compensatory strategy training, management of diet tolerance and trials of advanced textures, and swallowing exercises to improve pharyngeal swallow function by targeting pharyngeal strength and range of motion.  Swallow Evaluation Recommendations       SLP Diet Recommendations: Dysphagia 3 (Mech soft) solids;Thin liquid   Liquid Administration via: Cup;No straw   Medication Administration: Whole meds with puree   Supervision: Full supervision/cueing for compensatory strategies;Staff to assist with self feeding   Compensations: Slow rate;Small sips/bites; Chin tuck with thin liquids   Postural  Changes: Seated upright at 90 degrees   Oral Care Recommendations: Oral care BID        [name], [place], [place] Office: [phone]; Pager (7/12): [phone] 07/05/2019,1:38 PM

## 2019-07-05 NOTE — Progress Notes (Signed)
STROKE TEAM PROGRESS NOTE   INTERVAL HISTORY Pt sitting in chair, still has hypophonia and slurry speech, pending MBS for swallowing eval. He is awake alert and orientated but dense left hemiplegia with left lower quadrantanopia. On heparin IV. Still has tachycardia.    Vitals:   07/05/19 1234 07/05/19 1300 07/05/19 1400 07/05/19 1500  BP:  128/68 (!) 143/71 (!) 145/66  Pulse:  (!) 109 (!) 117 (!) 118  Resp:  17 (!) 21 (!) 22  Temp: 98.8 F (37.1 C)     TempSrc: Oral     SpO2:  98% 98% 97%  Weight:      Height:        CBC:  Recent Labs  Lab 07/03/19 0205  07/04/19 0544 07/05/19 0744  WBC 6.1  --  7.2 5.9  NEUTROABS 4.3  --  6.0  --   HGB 9.6*   < > 7.7* 7.4*  HCT 30.5*   < > 24.6* 23.5*  MCV 83.8  --  86.6 85.1  PLT 256  --  245 236   < > = values in this interval not displayed.    Basic Metabolic Panel:  Recent Labs  Lab 07/04/19 0544 07/05/19 0744  NA 136 137  K 3.6 3.1*  CL 106 109  CO2 20* 19*  GLUCOSE 103* 85  BUN 12 15  CREATININE 0.68 0.80  CALCIUM 7.8* 7.9*   Lipid Panel:     Component Value Date/Time   CHOL 117 07/03/2019 0500   TRIG 93 07/03/2019 0500   HDL 29 (L) 07/03/2019 0500   CHOLHDL 4.0 07/03/2019 0500   VLDL 19 07/03/2019 0500   LDLCALC 69 07/03/2019 0500   LDLCALC 99 12/02/2018 0906   HgbA1c:  Lab Results  Component Value Date   HGBA1C 5.7 (H) 07/03/2019   Urine Drug Screen:     Component Value Date/Time   LABOPIA POSITIVE (A) 07/03/2019 0258   COCAINSCRNUR NONE DETECTED 07/03/2019 0258   LABBENZ NONE DETECTED 07/03/2019 0258   AMPHETMU NONE DETECTED 07/03/2019 0258   THCU NONE DETECTED 07/03/2019 0258   LABBARB NONE DETECTED 07/03/2019 0258    Alcohol Level     Component Value Date/Time   ETH <10 07/03/2019 0205    IMAGING  Dg Swallowing Func-speech Pathology  07/05/2019 DIET RECOMMENDATION 07/05/2019 SLP Diet Recommendations Dysphagia 3 (Mech soft) solids;Thin liquid Liquid Administration via Cup;No straw  Medication Administration Whole meds with puree Compensations Slow rate;Small sips/bites;Chin tuck Postural Changes Seated upright at 90 degrees   CHL IP OTHER RECOMMENDATIONS 07/05/2019 Recommended Consults -- Oral Care Recommendations Oral care BID   Cerebral angiogram S/O RT common carotid arteriogram followed complete revascularization of occluded MCA dominant mid division with x2 passes with 64mm x 8mm embotrap and x 3 passes with solitaire x 4 x 40 mm retriever device achieving a TICI 3 revascularization. Also partial revascularization of occluded ACA A2 A3 region with x 1 pass with solitaire X 58mmx 40 mm device.  2D Echocardiogram  06/30/2019  1. The left ventricle has normal systolic function with an ejection fraction of 60-65%. The cavity size was normal. Left ventricular diastolic Doppler parameters are consistent with impaired relaxation. No evidence of left ventricular regional wall  motion abnormalities.  2. The right ventricle has normal systolic function. The cavity was normal. There is no increase in right ventricular wall thickness.  3. Trivial pericardial effusion is present.  4. No evidence of mitral valve stenosis. Mild mitral regurgitation.  5. The aortic valve  is tricuspid. No stenosis of the aortic valve.  6. Normal IVC size. No complete TR doppler jet so unable to estimate PA systolic pressure.  PHYSICAL EXAM  Temp:  [98.1 F (36.7 C)-98.8 F (37.1 C)] 98.8 F (37.1 C) (07/12 1234) Pulse Rate:  [33-128] 118 (07/12 1500) Resp:  [17-26] 22 (07/12 1500) BP: (112-146)/(60-98) 145/66 (07/12 1500) SpO2:  [90 %-100 %] 97 % (07/12 1500)  General - Well nourished, well developed, lethargic.  Ophthalmologic - fundi not visualized due to noncooperation.  Cardiovascular - Regular rhythm, but tachycardia.  Mental Status -  Level of arousal and orientation to time, place, and person were intact. Language including expression, naming, repetition, comprehension was assessed and  found intact, however hypophonia and mild dysarthria.  Cranial Nerves II - XII - II - left lower quadrantanopia. III, IV, VI - Extraocular movements intact, no gaze deviation. V - Facial sensation intact bilaterally. VII - left facial droop. VIII - Hearing & vestibular intact bilaterally. X - Palate elevates not able to see well, but mild dysarthria. XI - Chin turning & shoulder shrug intact bilaterally. XII - Tongue protrusion intact.  Motor Strength - The patient's strength was 4/5 RUE and 3/5 RLE but dense hemiplegia on the left UE and LE.  Bulk was normal and fasciculations were absent.   Motor Tone - Muscle tone was assessed at the neck and appendages and was decreased on the left.  Reflexes - The patient's reflexes were symmetrical in all extremities and he had triple reflex on the left.  Sensory - Light touch, temperature/pinprick were assessed and were symmetrical.    Coordination - The patient had normal movements in the right hand with no ataxia or dysmetria.  Tremor was absent.  Gait and Station - deferred.   ASSESSMENT/PLAN [name] is a 68 y.o. male with history of DVT on Xarelto, adenocarcinoma of the right lung, hyperlipidemia, prior strokes noted on MRI presenting with L sided weakness and R gaze. No tPA d/t xarelto.   Stroke:   Embolic shower right MCA occlusion s/p IR with TICI3 reperfusion- infarct embolic secondary to hypercoagulable state from lung cancer even on Xarelto for DVT. Marantic endocarditis due to advanced malignancy cannot be ruled out.  Code Stroke CT head No acute abnormality.     CTA head & neck ELVO prox R M2/M3 occlusion  CT perfusion Mod-sized penumbra  Cerebral angio R MCA occlusion, TICI3 revascularization; partial revascularization occluded ACA A2 A3  Post IR CT slight R SAH in R perisylvian fissure and anterior interhemispheric fissure. No gross mass effect.  MRI  Multiple infarcts R parietal and R cingulate gyrus. Numerous  punctate L>R parietal due to small emboli  2D Echo EF 60-65%. No source of embolus   TCD w/ bubble negative for PFO  Will not pursue TEE to rule out marantic endocarditis given no change of therapy  LDL - 69  HgbA1c - 5.7  IV heparin for VTE prophylaxis  Xarelto (rivaroxaban) daily prior to admission, now on IV Heparin.  Consider to switch to p.o. once tolerated  Therapy recommendations:  CIR - MD consult placed per [facility] request  Disposition:  pending   History of recent stroke also likely related to hypercarbia state  MRI on 06/10/2019 showed multiple embolic cortical and subcortical bilateral infarcts.    2D echo at that time was unremarkable.   Continued on Xarelto  Respiratory Failure  Intubated for IR  Left intubated post IR  Extubated 07/04/2019  Tolerating well  R leg DVT/SVT  Confirmed by doppler 04/15/2019 and 06/20/2019  On xarelto PTA  Currently on heparin IV  Consider to switch to p.o. once tolerated  Stage 4 adenocarcinoma R lung  On chemo  Follow with Dr. Julien Nordmann  Hyperlipidemia  Home meds:  pravachol 20  LDL 69, goal < 70  Now on Crestor 20 mg daily  Continue statin at discharge  Dysphagia . Secondary to stroke . Passed barium study  . now on dysphagia 2 diet with thin liquid   Other Stroke Risk Factors  Advanced age  Former Cigarette smoker, quit 4 mos ago, advised to continue cessation of smoking  ETOH use, advised to drink no more than 2 drink(s) a day  Other Active Problems  GERD  Anemia due to chronic disease- Hb 9.9-7.4  Hypokalemia - 3.1 -> supplemented - recheck Monday  Hospital day # 2  This patient is critically ill due to embolic stroke, stage IV lung cancer, hypercoagulable state, DVT and at significant risk of neurological worsening, death form recurrent stroke, hemorrhagic conversion, embolism, seizure. This patient's care requires constant monitoring of vital signs, hemodynamics, respiratory and  cardiac monitoring, review of multiple databases, neurological assessment, discussion with family, other specialists and medical decision making of high complexity. I spent 35 minutes of neurocritical care time in the care of this patient.  Rosalin Hawking, MD PhD Stroke Neurology 07/05/2019 7:11 PM   To contact Stroke Continuity provider, please refer to [email or web address] After hours, contact General Neurology

## 2019-07-05 NOTE — Progress Notes (Signed)
ANTICOAGULATION CONSULT NOTE - Follow-up Consult  Pharmacy Consult for IV Heparin  Indication: DVT and stroke  No Known Allergies  Patient Measurements: Height: 5\' 11"  (180.3 cm) Weight: 148 lb 5.9 oz (67.3 kg) IBW/kg (Calculated) : 75.3 Heparin Dosing Weight: 67.3 kg  Vital Signs: Temp: 99.5 F (37.5 C) (07/12 1618) Temp Source: Oral (07/12 1618) BP: 146/78 (07/12 1900) Pulse Rate: 118 (07/12 1900)  Labs: Recent Labs    07/03/19 0205 07/03/19 0213 07/03/19 7591  07/04/19 0544 07/04/19 0815 07/04/19 1812 07/05/19 0744 07/05/19 1826  HGB 9.6* 9.9* 7.5*  --  7.7*  --   --  7.4*  --   HCT 30.5* 29.0* 22.0*  --  24.6*  --   --  23.5*  --   PLT 256  --   --   --  245  --   --  236  --   APTT 51*  --   --    < >  --  53* 50* 70*  --   LABPROT 35.1*  --   --   --   --   --   --   --   --   INR 3.6*  --   --   --   --   --   --   --   --   HEPARINUNFRC  --   --   --   --   --  0.62  --  0.29* 0.28*  CREATININE 0.89 0.70  --   --  0.68  --   --  0.80  --    < > = values in this interval not displayed.    Estimated Creatinine Clearance: 85.3 mL/min (by C-G formula based on SCr of 0.8 mg/dL).   Medical History: Past Medical History:  Diagnosis Date  . Cancer ([place])    stage 4 adenocarcinoma right lung  . GERD (gastroesophageal reflux disease)   . Hyperlipidemia   . Stroke ([place])    25-30 emboli seen on screening MRI    Medications:  Infusions:  . sodium chloride 50 mL/hr at 07/05/19 1100  . sodium chloride    . heparin 1,100 Units/hr (07/05/19 1900)    Assessment: 68 year old male admitted as code stroke on 7/10 early am - no tPA as was on Xarelto prior to admission for recent DVT with last dose on 7/9 at 1700 PM. Patient was found to have R M2 M3 occlusion and went to IR for mechanical thrombectomy  on 7/10. IV Heparin drip per pharmacy protocol for RLE DVT (LE Dopplers 06/20/19). Last Xarelto dose on 07/02/19 at 1700 PM (Day #15 of start pack - was still on BID  dosing).  -heparin level 0.28 on 1100 units/hr   Goal of Therapy:  Heparin level 0.3 to 0.5 units/ml aPTT 66-85 seconds Monitor platelets by anticoagulation protocol: Yes    Plan:  Increase Heparin to 1200 units/hr. Daily Heparin levels and CBC while on therapy.Hildred Laser, PharmD Clinical Pharmacist **Pharmacist phone directory can now be found on [email or web address] ([email or web address]).  Listed under [place].

## 2019-07-05 NOTE — Progress Notes (Signed)
Physical Therapy Treatment Patient Details Name: Kyle Garcia MRN: [id number] DOB: 11/01/1951 Today's Date: 07/05/2019    History of Present Illness Patient is a 68 y/o male who presents with left sided weakness. Head CT-Positive CTA for emergent large vessel occlusion, with proximal right M2/M3 occlusion. Brain MRI- large right MCA/ACA infarct with smaller infarcts consistent with embolic phenomenon (25-85 infarcts seen). Now s/p mechanical thrombectomy on 7/10. Recent diagnosis of RLE DVT (6/27) on Xarelto, HLD and stage IV adenocarcinoma of the lung in May 2020.    PT Comments    Patient seen in conjunction with OT for activity progression and neuromuscular facilitation for balance and left side attention. Patient tolerated well. Current POC remains appropriate.   Follow Up Recommendations  CIR;Supervision for mobility/OOB;Supervision/Assistance - 24 hour     Equipment Recommendations  Other (comment)(defer)    Recommendations for Other Services Rehab consult     Precautions / Restrictions Precautions Precautions: Fall Restrictions Weight Bearing Restrictions: No    Mobility  Bed Mobility Overal bed mobility: Needs Assistance Bed Mobility: Supine to Sit     Supine to sit: Max assist;+2 for physical assistance     General bed mobility comments: Pt reached with RUE for bed rail, but missed and then started reaching for me--I had re-oriented his RUE to upper bed rail, he then moved his RLE towards EOB; pt needed A for rest of coming up to sit (LLE, LUE, trunk)  Transfers Overall transfer level: Needs assistance   Transfers: Lateral/Scoot Transfers;Sit to/from Stand Sit to Stand: Mod assist(partial stand to place new alarm pad under him)        Lateral/Scoot Transfers: +2 physical assistance;Mod assist General transfer comment: bed>drop arm reclier going to pt's right  Ambulation/Gait                 Stairs             Wheelchair Mobility     Modified Rankin (Stroke Patients Only)       Balance Overall balance assessment: Needs assistance Sitting-balance support: Single extremity supported;Feet supported Sitting balance-Leahy Scale: Poor Sitting balance - Comments: While seated EOB, pt at times reaching too far to right and leaning too forward. Had him work on leaning forward and back as well as coming on left forarm and right forearms --alternately and then back up to midline (he needed increased cues, min A occassionally, and tactile cues)                                    Cognition Arousal/Alertness: Awake/alert Behavior During Therapy: Flat affect Overall Cognitive Status: Impaired/Different from baseline Area of Impairment: Safety/judgement;Following commands;Awareness;Problem solving;Attention                   Current Attention Level: Sustained   Following Commands: Follows one step commands with increased time;Follows one step commands inconsistently Safety/Judgement: Decreased awareness of deficits;Decreased awareness of safety Awareness: Emergent Problem Solving: Decreased initiation;Difficulty sequencing;Requires verbal cues;Requires tactile cues General Comments: A&Ox4. Pt with right gaze preference.Minimal awareness of left LLE but not LUE.      Exercises Other Exercises Other Exercises: Dynamic trunk work at EOB, lateral lean on elbows with facilitation techniques from trunk engagement Other Exercises: flexion extension sitting with cues for cervical and lumbar/pelvic control Other Exercises: Weightbearing LUE with dynamic task performance    General Comments        Pertinent Vitals/Pain Pain  Assessment: No/denies pain Faces Pain Scale: No hurt    Home Living Family/patient expects to be discharged to:: Inpatient rehab Living Arrangements: Spouse/significant other Available Help at Discharge: Family Type of Home: House Home Access: Stairs to enter Entrance  Stairs-Rails: Right Home Layout: One level        Prior Function Level of Independence: Independent      Comments: Retired.   PT Goals (current goals can now be found in the care plan section) Acute Rehab PT Goals Patient Stated Goal: to get something to drink PT Goal Formulation: With patient Time For Goal Achievement: 07/18/19 Potential to Achieve Goals: Fair Progress towards PT goals: Progressing toward goals    Frequency    Min 4X/week      PT Plan      Co-evaluation PT/OT/SLP Co-Evaluation/Treatment: Yes Reason for Co-Treatment: Complexity of the patient's impairments (multi-system involvement) PT goals addressed during session: Mobility/safety with mobility OT goals addressed during session: ADL's and self-care      AM-PAC PT "6 Clicks" Mobility   Outcome Measure  Help needed turning from your back to your side while in a flat bed without using bedrails?: A Lot Help needed moving from lying on your back to sitting on the side of a flat bed without using bedrails?: A Lot Help needed moving to and from a bed to a chair (including a wheelchair)?: A Lot Help needed standing up from a chair using your arms (e.g., wheelchair or bedside chair)?: A Lot Help needed to walk in hospital room?: Total Help needed climbing 3-5 steps with a railing? : Total 6 Click Score: 10    End of Session Equipment Utilized During Treatment: Gait belt Activity Tolerance: Patient tolerated treatment well Patient left: in chair;with call bell/phone within reach;with chair alarm set;with nursing/sitter in room Nurse Communication: Mobility status PT Visit Diagnosis: Hemiplegia and hemiparesis;Difficulty in walking, not elsewhere classified (R26.2) Hemiplegia - Right/Left: Left Hemiplegia - dominant/non-dominant: Non-dominant Hemiplegia - caused by: Cerebral infarction     Time: 6378-5885 PT Time Calculation (min) (ACUTE ONLY): 31 min  Charges:  $Neuromuscular Re-education: 8-22  mins                     Kyle Garcia, PT DPT  Board Certified Neurologic Specialist [place] Pager [phone] Office [phone]    Kyle Garcia 07/05/2019, 12:33 PM

## 2019-07-05 NOTE — Evaluation (Signed)
Clinical/Bedside Swallow Evaluation Patient Details  Name: Kyle Garcia MRN: [id number] Date of Birth: September 17, 1951  Today's Date: 07/05/2019 Time: SLP Start Time (ACUTE ONLY): 1008 SLP Stop Time (ACUTE ONLY): 1016 SLP Time Calculation (min) (ACUTE ONLY): 8 min  Past Medical History:  Past Medical History:  Diagnosis Date  . Cancer ([place])    stage 4 adenocarcinoma right lung  . GERD (gastroesophageal reflux disease)   . Hyperlipidemia   . Stroke [facility])    25-30 emboli seen on screening MRI   Past Surgical History:  Past Surgical History:  Procedure Laterality Date  . CATARACT EXTRACTION  2016  . EYE SURGERY    . VIDEO BRONCHOSCOPY Bilateral 05/21/2019   Procedure: VIDEO BRONCHOSCOPY WITH FLUORO;  Surgeon: Collene Gobble, MD;  Location[facility] ENDOSCOPY;  Service: Cardiopulmonary;  Laterality: Bilateral;   HPI:  68yo M with history of R DVT on Xarelto, adenocarcinoma of the R lung, prior strokes on imaging, HLD, and GERD, who p/w left-sided hemiplegia and R gaze deviation. Found to have R MCA and ACA occlusions. Now s/p mechanical thrombectomy with VIR on 7/10. CXR 7/10: "Prominent right mid lung field infiltrate again noted without interim change."   Assessment / Plan / Recommendation Clinical Impression  Pt presents with clinical indicators of pharyngeal dysphagia.  With all consistencies trialed there was immediate cough; however, pt denied sensation of food/drink going down the wrong way.  Pt would benefit from MBSS to determine if there is a possibility of safe oral intake, or if alternate means of nutrition will be needed.  MBSS planned for later this date. SLP Visit Diagnosis: Dysphagia, oropharyngeal phase (R13.12)    Aspiration Risk  Moderate aspiration risk    Diet Recommendation NPO        Other  Recommendations Oral Care Recommendations: Oral care BID     Swallow Study   General HPI: 68yo M with history of R DVT on Xarelto, adenocarcinoma of the R lung, prior strokes  on imaging, HLD, and GERD, who p/w left-sided hemiplegia and R gaze deviation. Found to have R MCA and ACA occlusions. Now s/p mechanical thrombectomy with VIR on 7/10. CXR 7/10: "Prominent right mid lung field infiltrate again noted without interim change." Type of Study: Bedside Swallow Evaluation Previous Swallow Assessment: failed [facility] per RN Diet Prior to this Study: NPO Temperature Spikes Noted: No History of Recent Intubation: Yes Length of Intubations (days): 1 days Date extubated: 07/04/19 Behavior/Cognition: Alert;Cooperative;Pleasant mood Oral Cavity Assessment: Within Functional Limits Oral Cavity - Dentition: Adequate natural dentition Patient Positioning: Upright in chair Baseline Vocal Quality: Normal Volitional Swallow: Unable to elicit    Oral/Motor/Sensory Function Overall Oral Motor/Sensory Function: Moderate impairment Facial ROM: Reduced left Facial Symmetry: Abnormal symmetry left Facial Strength: Reduced left Lingual ROM: Within Functional Limits Lingual Symmetry: Within Functional Limits Lingual Strength: Reduced Velum: Within Functional Limits Mandible: Within Functional Limits   Ice Chips Ice chips: Impaired Presentation: Cup Pharyngeal Phase Impairments: Cough - Immediate   Thin Liquid Thin Liquid: Impaired Presentation: Cup Pharyngeal  Phase Impairments: Cough - Immediate    Nectar Thick Nectar Thick Liquid: Not tested   Honey Thick Honey Thick Liquid: Not tested   Puree Puree: Impaired Presentation: Spoon Pharyngeal Phase Impairments: Cough - Immediate   Solid     Solid: Not tested      [name], [place], [place] Office: [phone]; Pager (7/12): [phone] 07/05/2019,11:16 AM

## 2019-07-05 NOTE — Progress Notes (Signed)
ANTICOAGULATION CONSULT NOTE - Follow-up Consult  Pharmacy Consult for IV Heparin  Indication: DVT and stroke  No Known Allergies  Patient Measurements: Height: 5\' 11"  (180.3 cm) Weight: 148 lb 5.9 oz (67.3 kg) IBW/kg (Calculated) : 75.3 Heparin Dosing Weight: 67.3 kg  Vital Signs: Temp: 98.6 F (37 C) (07/12 0400) Temp Source: Oral (07/12 0400) BP: 133/64 (07/12 0700) Pulse Rate: 126 (07/12 0700)  Labs: Recent Labs    07/03/19 0205 07/03/19 0213 07/03/19 0828  07/04/19 0544 07/04/19 0815 07/04/19 1812 07/05/19 0744  HGB 9.6* 9.9* 7.5*  --  7.7*  --   --  7.4*  HCT 30.5* 29.0* 22.0*  --  24.6*  --   --  23.5*  PLT 256  --   --   --  245  --   --  236  APTT 51*  --   --    < >  --  53* 50* 70*  LABPROT 35.1*  --   --   --   --   --   --   --   INR 3.6*  --   --   --   --   --   --   --   HEPARINUNFRC  --   --   --   --   --  0.62  --  0.29*  CREATININE 0.89 0.70  --   --  0.68  --   --  0.80   < > = values in this interval not displayed.    Estimated Creatinine Clearance: 85.3 mL/min (by C-G formula based on SCr of 0.8 mg/dL).   Medical History: Past Medical History:  Diagnosis Date  . Cancer ([place])    stage 4 adenocarcinoma right lung  . GERD (gastroesophageal reflux disease)   . Hyperlipidemia   . Stroke ([place])    25-30 emboli seen on screening MRI    Medications:  Infusions:  . sodium chloride 75 mL/hr at 07/05/19 0700  . sodium chloride 500 mL/hr at 07/03/19 1033  . sodium chloride    . heparin 1,050 Units/hr (07/05/19 0700)  . phenylephrine (NEO-SYNEPHRINE) Adult infusion    . propofol (DIPRIVAN) infusion Stopped (07/04/19 0744)    Assessment: 68 year old male admitted as code stroke on 7/10 early am - no tPA as was on Xarelto prior to admission for recent DVT with last dose on 7/9 at 1700 PM. Patient was found to have R M2 M3 occlusion and went to IR for mechanical thrombectomy around 6 AM this morning. Sheath removed 5 PM on 7/10. IV Heparin drip  per pharmacy protocol for RLE DVT (LE Dopplers 06/20/19). Last Xarelto dose on 07/02/19 at 1700 PM (Day #15 of start pack - was still on BID dosing).   Update 7/12 AM: Heparin level initially elevated in setting of recent Xarelto, now down. Heparin level and aPTTs are correlating this AM so will use HL going forward. Heparin level is just slightly low at 0.29 with aPTT of 70. Hgb as trended down at 7.4. Platelets are stable. No overt bleeding noted.   Goal of Therapy:  Heparin level 0.3 to 0.5 units/ml aPTT 66-85 seconds Monitor platelets by anticoagulation protocol: Yes    Plan:  Increase Heparin slightly to 1100 units/hr. Recheck HL in 6 hours.  Monitor for any signs of bleeding.  Daily Heparin levels and CBC while on therapy.Sloan Leiter, PharmD, BCPS, BCCCP Clinical Pharmacist Please refer to [facility] for [place] numbers 07/05/2019 8:41  AM

## 2019-07-06 ENCOUNTER — Telehealth: Payer: Self-pay | Admitting: *Deleted

## 2019-07-06 ENCOUNTER — Encounter (HOSPITAL_COMMUNITY): Payer: Self-pay | Admitting: Interventional Radiology

## 2019-07-06 DIAGNOSIS — Z7901 Long term (current) use of anticoagulants: Secondary | ICD-10-CM

## 2019-07-06 DIAGNOSIS — I6601 Occlusion and stenosis of right middle cerebral artery: Secondary | ICD-10-CM

## 2019-07-06 DIAGNOSIS — R1312 Dysphagia, oropharyngeal phase: Secondary | ICD-10-CM

## 2019-07-06 DIAGNOSIS — I82401 Acute embolism and thrombosis of unspecified deep veins of right lower extremity: Secondary | ICD-10-CM

## 2019-07-06 DIAGNOSIS — C3490 Malignant neoplasm of unspecified part of unspecified bronchus or lung: Secondary | ICD-10-CM

## 2019-07-06 LAB — CBC
HCT: 24.1 % — ABNORMAL LOW (ref 39.0–52.0)
Hemoglobin: 7.8 g/dL — ABNORMAL LOW (ref 13.0–17.0)
MCH: 27.2 pg (ref 26.0–34.0)
MCHC: 32.4 g/dL (ref 30.0–36.0)
MCV: 84 fL (ref 80.0–100.0)
Platelets: 267 10*3/uL (ref 150–400)
RBC: 2.87 MIL/uL — ABNORMAL LOW (ref 4.22–5.81)
RDW: 15.5 % (ref 11.5–15.5)
WBC: 6.3 10*3/uL (ref 4.0–10.5)
nRBC: 0 % (ref 0.0–0.2)

## 2019-07-06 LAB — BASIC METABOLIC PANEL
Anion gap: 7 (ref 5–15)
BUN: 14 mg/dL (ref 8–23)
CO2: 22 mmol/L (ref 22–32)
Calcium: 8 mg/dL — ABNORMAL LOW (ref 8.9–10.3)
Chloride: 108 mmol/L (ref 98–111)
Creatinine, Ser: 0.72 mg/dL (ref 0.61–1.24)
GFR calc Af Amer: >60 mL/min (ref >60)
GFR calc non Af Amer: >60 mL/min (ref >60)
Glucose, Bld: 97 mg/dL (ref 70–99)
Potassium: 3.4 mmol/L — ABNORMAL LOW (ref 3.5–5.1)
Sodium: 137 mmol/L (ref 135–145)

## 2019-07-06 LAB — HEPARIN LEVEL (UNFRACTIONATED): Heparin Unfractionated: 0.27 IU/mL — ABNORMAL LOW (ref 0.30–0.70)

## 2019-07-06 MED ORDER — RESOURCE THICKENUP CLEAR PO POWD
ORAL | Status: DC | PRN
  Filled 2019-07-06: qty 125

## 2019-07-06 MED ORDER — POTASSIUM CHLORIDE CRYS ER 20 MEQ PO TBCR
40.0000 meq | EXTENDED_RELEASE_TABLET | ORAL | Status: AC
  Administered 2019-07-06 (×2): 40 meq via ORAL
  Filled 2019-07-06 (×2): qty 2

## 2019-07-06 MED ORDER — ENSURE ENLIVE PO LIQD
237.0000 mL | Freq: Two times a day (BID) | ORAL | Status: DC
  Administered 2019-07-06 – 2019-07-10 (×6): 237 mL via ORAL

## 2019-07-06 MED ORDER — APIXABAN 5 MG PO TABS
5.0000 mg | ORAL_TABLET | Freq: Two times a day (BID) | ORAL | Status: DC
  Administered 2019-07-06 – 2019-07-10 (×9): 5 mg via ORAL
  Filled 2019-07-06 (×9): qty 1

## 2019-07-06 NOTE — Progress Notes (Signed)
Nutrition Follow-up  DOCUMENTATION CODES:   Not applicable  INTERVENTION:   Magic cup TID with meals, each supplement provides 290 kcal and 9 grams of protein  Ensure Enlive po BID, each supplement provides 350 kcal and 20 grams of protein   NUTRITION DIAGNOSIS:   Increased nutrient needs related to cancer and cancer related treatments as evidenced by estimated needs. Ongoing  GOAL:   Patient will meet greater than or equal to 90% of their needs Progressing  MONITOR:   PO intake, Supplement acceptance, Diet advancement  REASON FOR ASSESSMENT:   Ventilator, Malnutrition Screening Tool    ASSESSMENT:   Pt with PMH of DVT on Xarelto, stage IV lung cancer who started chemo 6/24, pt recently hospitalized for PNA, LD, prior strokes now admitted with L M2 occlusion s/p IR for mechanical thrombectomy.   RD working remotely. Per CCM pt somewhat confused this am.   7/11 extubated 7/12 diet advanced to Dysphagia 2 with thin liquids  Medications and reviewed    Diet Order:   Diet Order            DIET DYS 2 Room service appropriate? Yes; Fluid consistency: Thin  Diet effective now              EDUCATION NEEDS:   Not appropriate for education at this time  Skin:  Skin Assessment: Reviewed RN Assessment  Last BM:  unknown  Height:   Ht Readings from Last 1 Encounters:  07/03/19 5\' 11"  (1.803 m)    Weight:   Wt Readings from Last 1 Encounters:  07/03/19 67.3 kg    Ideal Body Weight:  78.1 kg  BMI:  Body mass index is 20.69 kg/m.  Estimated Nutritional Needs:   Kcal:  1900-2100  Protein:  100-120 grams  Fluid:  > 1.9 L/day  Maylon Peppers RD, LDN, CNSC [phone] Pager [phone] After Hours Pager

## 2019-07-06 NOTE — Telephone Encounter (Signed)
Pt wife called to cancel pt upcoming appts. Pt had a stroke Friday morning 7/10, admitted to [facility] ICU.   Appt with lab/MD/infusion 7/15. Dr. Mickeal Skinner 7/27  Message forward to providers for review.  Message to scheduling to cancel lab/Dr. Julien Nordmann /infusion appt.

## 2019-07-06 NOTE — Progress Notes (Signed)
Pt transferred to [address]. Wife notified of new room #. Handoff to [employer]. Belongings: undershirt and shorts transferred with pt.

## 2019-07-06 NOTE — Progress Notes (Signed)
Pt arrived to 3W08 with belongings. Alert and oriented x4, however making statements about a basketball championship last night that [place] played in. Has a strong productive cough. Telemetry verified. Will continue to monitor.

## 2019-07-06 NOTE — Progress Notes (Signed)
STROKE TEAM PROGRESS NOTE   INTERVAL HISTORY Pt sitting in bed, more awake alert and talkative, no more hypophonia. Still has dense left hemiplegia. Still has cough with food, will need speech re-eval. Pt tolerated heparin IV well, will switch to eliquis today.    Vitals:   07/06/19 0400 07/06/19 0500 07/06/19 0600 07/06/19 0700  BP: (!) 152/68 124/62 136/66 137/65  Pulse: (!) 125 (!) 117 (!) 116 99  Resp: (!) 25 (!) 21 (!) 21 17  Temp: (!) 100.5 F (38.1 C)  99.5 F (37.5 C)   TempSrc: Oral  Oral   SpO2: 99% 95% 96% 98%  Weight:      Height:        CBC:  Recent Labs  Lab 07/03/19 0205  07/04/19 0544 07/05/19 0744 07/06/19 0219  WBC 6.1  --  7.2 5.9 6.3  NEUTROABS 4.3  --  6.0  --   --   HGB 9.6*   < > 7.7* 7.4* 7.8*  HCT 30.5*   < > 24.6* 23.5* 24.1*  MCV 83.8  --  86.6 85.1 84.0  PLT 256  --  245 236 267   < > = values in this interval not displayed.    Basic Metabolic Panel:  Recent Labs  Lab 07/05/19 0744 07/06/19 0219  NA 137 137  K 3.1* 3.4*  CL 109 108  CO2 19* 22  GLUCOSE 85 97  BUN 15 14  CREATININE 0.80 0.72  CALCIUM 7.9* 8.0*   Lipid Panel:     Component Value Date/Time   CHOL 117 07/03/2019 0500   TRIG 93 07/03/2019 0500   HDL 29 (L) 07/03/2019 0500   CHOLHDL 4.0 07/03/2019 0500   VLDL 19 07/03/2019 0500   LDLCALC 69 07/03/2019 0500   LDLCALC 99 12/02/2018 0906   HgbA1c:  Lab Results  Component Value Date   HGBA1C 5.7 (H) 07/03/2019   Urine Drug Screen:     Component Value Date/Time   LABOPIA POSITIVE (A) 07/03/2019 0258   COCAINSCRNUR NONE DETECTED 07/03/2019 0258   LABBENZ NONE DETECTED 07/03/2019 0258   AMPHETMU NONE DETECTED 07/03/2019 0258   THCU NONE DETECTED 07/03/2019 0258   LABBARB NONE DETECTED 07/03/2019 0258    Alcohol Level     Component Value Date/Time   ETH <10 07/03/2019 0205    IMAGING  Dg Swallowing Func-speech Pathology  07/05/2019 DIET RECOMMENDATION 07/05/2019 SLP Diet Recommendations Dysphagia 3  (Mech soft) solids;Thin liquid Liquid Administration via Cup;No straw Medication Administration Whole meds with puree Compensations Slow rate;Small sips/bites;Chin tuck Postural Changes Seated upright at 90 degrees   CHL IP OTHER RECOMMENDATIONS 07/05/2019 Recommended Consults -- Oral Care Recommendations Oral care BID   Cerebral angiogram S/O RT common carotid arteriogram followed complete revascularization of occluded MCA dominant mid division with x2 passes with 40mm x 70mm embotrap and x 3 passes with solitaire x 4 x 40 mm retriever device achieving a TICI 3 revascularization. Also partial revascularization of occluded ACA A2 A3 region with x 1 pass with solitaire X 77mmx 40 mm device.  2D Echocardiogram  06/30/2019  1. The left ventricle has normal systolic function with an ejection fraction of 60-65%. The cavity size was normal. Left ventricular diastolic Doppler parameters are consistent with impaired relaxation. No evidence of left ventricular regional wall  motion abnormalities.  2. The right ventricle has normal systolic function. The cavity was normal. There is no increase in right ventricular wall thickness.  3. Trivial pericardial effusion is present.  4. No evidence of mitral valve stenosis. Mild mitral regurgitation.  5. The aortic valve is tricuspid. No stenosis of the aortic valve.  6. Normal IVC size. No complete TR doppler jet so unable to estimate PA systolic pressure.  PHYSICAL EXAM  Temp:  [98.6 F (37 C)-100.8 F (38.2 C)] 99.5 F (37.5 C) (07/13 0600) Pulse Rate:  [33-125] 99 (07/13 0700) Resp:  [17-29] 17 (07/13 0700) BP: (117-159)/(62-87) 137/65 (07/13 0700) SpO2:  [90 %-100 %] 98 % (07/13 0700)  General - Well nourished, well developed, not in acute distress.  Ophthalmologic - fundi not visualized due to noncooperation.  Cardiovascular - Regular rhythm, but tachycardia.  Mental Status -  Level of arousal and orientation to time, place, and person were  intact. Language including expression, naming, repetition, comprehension was assessed and found intact, however mild dysarthria.  Cranial Nerves II - XII - II - left lower quadrantanopia. III, IV, VI - Extraocular movements intact, no gaze deviation. V - Facial sensation intact bilaterally. VII - left facial droop. VIII - Hearing & vestibular intact bilaterally. X - Palate elevates not able to see well, but mild dysarthria. XI - Chin turning & shoulder shrug intact bilaterally. XII - Tongue protrusion intact.  Motor Strength - The patient's strength was 4/5 RUE and 3/5 RLE but dense hemiplegia on the left UE and LE.  Bulk was normal and fasciculations were absent.   Motor Tone - Muscle tone was assessed at the neck and appendages and was decreased on the left.  Reflexes - The patient's reflexes were symmetrical in all extremities and he had triple reflex on the left.  Sensory - Light touch, temperature/pinprick were assessed and were symmetrical.    Coordination - The patient had normal movements in the right hand with no ataxia or dysmetria.  Tremor was absent.  Gait and Station - deferred.   ASSESSMENT/PLAN [name] is a 68 y.o. male with history of DVT on Xarelto, adenocarcinoma of the right lung, hyperlipidemia, prior strokes noted on MRI presenting with L sided weakness and R gaze. No tPA d/t xarelto.   Stroke:   Embolic shower right MCA occlusion s/p IR with TICI3 reperfusion- infarct embolic secondary to hypercoagulable state from lung cancer even on Xarelto for DVT. Marantic endocarditis due to advanced malignancy cannot be ruled out.  Code Stroke CT head No acute abnormality.     CTA head & neck ELVO prox R M2/M3 occlusion  CT perfusion Mod-sized penumbra  Cerebral angio R MCA occlusion, TICI3 revascularization; partial revascularization occluded ACA A2 A3  Post IR CT slight R SAH in R perisylvian fissure and anterior interhemispheric fissure. No gross mass  effect.  MRI  Multiple infarcts R parietal and R cingulate gyrus. Numerous punctate L>R parietal due to small emboli  2D Echo EF 60-65%. No source of embolus   TCD w/ bubble negative for PFO  Will not pursue TEE to rule out marantic endocarditis given no change of therapy  LDL - 69  HgbA1c - 5.7  IV heparin for VTE prophylaxis  Xarelto (rivaroxaban) daily prior to admission, tolerating heparin IV well and will change to Eliquis 5 bid today  Therapy recommendations:  CIR  Disposition:  pending   History of recent stroke also likely related to hypercoagulable state  MRI on 06/10/2019 showed multiple embolic cortical and subcortical bilateral infarcts.    2D echo at that time was unremarkable.   Continued on Xarelto -> now switch to eliquis  Respiratory Failure, resolved  Intubated for IR  Left intubated post IR  Extubated 07/04/2019  Tolerating well  R leg DVT/SVT  Confirmed by doppler 04/15/2019 and 06/20/2019  On xarelto PTA  Treated with heparin IV in hospital, tolerating well  Changed to Eliquis 5 bid today  Stage 4 adenocarcinoma R lung  On chemo  Follow with Dr. Julien Nordmann  Hyperlipidemia  Home meds:  pravachol 20  LDL 69, goal < 70  Now on Crestor 20 mg daily  Continue statin at discharge  Dysphagia . Secondary to stroke . Passed barium study  . now on dysphagia 2 diet with thin liquid . Still has cough with food intermittently . Speech on board for re-eval   Other Stroke Risk Factors  Advanced age  Former Cigarette smoker, quit 4 mos ago, advised to continue cessation of smoking  ETOH use, advised to drink no more than 2 drink(s) a day  Other Active Problems  GERD  Anemia due to chronic disease- Hb 9.9-7.7-7.4-7.8  Hypokalemia - 3.1 -> supplemented - 3.4-supplemented   Hospital day # 3  This patient is critically ill due to embolic stroke, stage IV lung cancer, hypercoagulable state, DVT and at significant risk of neurological  worsening, death form recurrent stroke, hemorrhagic conversion, embolism, seizure. This patient's care requires constant monitoring of vital signs, hemodynamics, respiratory and cardiac monitoring, review of multiple databases, neurological assessment, discussion with family, other specialists and medical decision making of high complexity. I spent 35 minutes of neurocritical care time in the care of this patient. I had long discussion with wife over the phone, updated pt current condition, treatment plan and potential prognosis. She expressed understanding and appreciation.    Rosalin Hawking, MD PhD Stroke Neurology 07/06/2019 9:47 AM   To contact Stroke Continuity provider, please refer to [email or web address] After hours, contact General Neurology

## 2019-07-06 NOTE — Discharge Instructions (Signed)
Information on my medicine - ELIQUIS (apixaban)  This medication education was reviewed with me or my healthcare representative as part of my discharge preparation.   Why was Eliquis prescribed for you? Eliquis was prescribed to treat blood clots that may have been found in the veins of your legs (deep vein thrombosis) or in your lungs (pulmonary embolism) and to reduce the risk of them occurring again.  What do You need to know about Eliquis ? The dose is ONE 5 mg tablet taken TWICE daily.  Eliquis may be taken with or without food.   Try to take the dose about the same time in the morning and in the evening. If you have difficulty swallowing the tablet whole please discuss with your pharmacist how to take the medication safely.  Take Eliquis exactly as prescribed and DO NOT stop taking Eliquis without talking to the doctor who prescribed the medication.  Stopping may increase your risk of developing a new blood clot.  Refill your prescription before you run out.  After discharge, you should have regular check-up appointments with your healthcare provider that is prescribing your Eliquis.    What do you do if you miss a dose? If a dose of ELIQUIS is not taken at the scheduled time, take it as soon as possible on the same day and twice-daily administration should be resumed. The dose should not be doubled to make up for a missed dose.  Important Safety Information A possible side effect of Eliquis is bleeding. You should call your healthcare provider right away if you experience any of the following: ? Bleeding from an injury or your nose that does not stop. ? Unusual colored urine (red or dark brown) or unusual colored stools (red or black). ? Unusual bruising for unknown reasons. ? A serious fall or if you hit your head (even if there is no bleeding).  Some medicines may interact with Eliquis and might increase your risk of bleeding or clotting while on Eliquis. To help avoid  this, consult your healthcare provider or pharmacist prior to using any new prescription or non-prescription medications, including herbals, vitamins, non-steroidal anti-inflammatory drugs (NSAIDs) and supplements.  This website has more information on Eliquis (apixaban): [email or web address]

## 2019-07-06 NOTE — Progress Notes (Signed)
  Speech Language Pathology Treatment: Dysphagia  Patient Details Name: Kyle Garcia MRN: [id number] DOB: [birth year] Today's Date: 07/06/2019 Time: 2233-6122 SLP Time Calculation (min) (ACUTE ONLY): 25 min  Assessment / Plan / Recommendation Clinical Impression  Notified by 4N and current RN on 3W of significant coughing with thin liquids. He was sleepy initially but able to maintain adequate wakeful state and max tactile cues to turn head toward left shoulder. He performed chin tuck accurately 80% of the time with what appeared to be delayed swallow initiation- observed during yesterday's MBS. There was consistent delayed strong coughing following thin and nectar thick trials. One cough with honey thick out of 4 trials- downgraded liquids to honey consistency with chin tuck for tonight, continue Dys 2, crush meds and ST to follow up tomorrow.   HPI HPI: 68yo M with history of R DVT on Xarelto, adenocarcinoma of the R lung, prior strokes on imaging, HLD, and GERD, who p/w left-sided hemiplegia and R gaze deviation. Found to have R MCA and ACA occlusions. Now s/p mechanical thrombectomy with VIR on 7/10. CXR 7/10: "Prominent right mid lung field infiltrate again noted without interim change."      SLP Plan  Continue with current plan of care       Recommendations  Diet recommendations: Dysphagia 2 (fine chop);Honey-thick liquid Liquids provided via: Cup Medication Administration: Crushed with puree Supervision: Full supervision/cueing for compensatory strategies;Patient able to self feed Compensations: Small sips/bites;Slow rate;Chin tuck(tuck with liquids) Postural Changes and/or Swallow Maneuvers: Seated upright 90 degrees                Oral Care Recommendations: Oral care BID Follow up Recommendations: Inpatient Rehab SLP Visit Diagnosis: Dysphagia, pharyngoesophageal phase (R13.14) Plan: Continue with current plan of care                       Houston Siren 07/06/2019, 4:51 PM  Orbie Pyo Colvin Caroli.Ed Risk analyst [phone] Office [phone]

## 2019-07-06 NOTE — Progress Notes (Signed)
Inpatient Rehabilitation Admissions Coordinator  Inpatient rehab consult received. I met with patient at bedside for rehab assessment. I discussed briefly with pt the need for more therapy. I contacted his wife and daughter, [name], by phone to discuss goals an expectations of an inpt rehab admit. They prefer inpt rehab. I will follow up over the next 24 to 48 hrs to assess his ability to tolerate the intensity required of an inpt rehab admit.  Danne Baxter, RN, MSN Rehab Admissions Coordinator [phone] 07/06/2019 4:30 PM

## 2019-07-06 NOTE — Progress Notes (Signed)
NAME:  Kyle Garcia, MRN:  [id number], DOB:  Sep 03, 1951, LOS: 3 ADMISSION DATE:  07/03/2019, CONSULTATION DATE:  07/03/2019 REFERRING MD:  Dr. Antony Contras, CHIEF COMPLAINT:  Acute Ischemic R MCA Stroke   Brief History   68yo M with history of R DVT on Xarelto, adenocarcinoma of the R lung, prior strokes on imaging, HLD, and GERD, who p/w left-sided hemiplegia and R gaze deviation. Found to have R MCA and ACA occlusions. Now s/p mechanical thrombectomy with VIR on 7/10.   Past Medical History  Stage 4 Adenocarcinoma of R lung GERD HLD CVA   Significant Hospital Events   7/10 Admission, Thrombectomy w/ IR, Transfer to ICU 7/11 Extubated   Consults:  VIR PCCM  Procedures:  7/10 VIR Mechanical Thromebctomy  Significant Diagnostic Tests:  MRI Brain 6/17 >> 25-30 micro emboli vs mets within L cerebellum and both cerebral hemispheres  ECHO 7/7 >> LVEF 60-65%   CTA Head/Neck 7/10 >> Proximal R M2/M3 occlusion. Internal carotids without stenosis.   MRI Brain 7/10 >> Multiple areas of acute infarction in R parietal region and R cingulate gyrus. Numerous punctate acute infarctions on both parietal regions L>R, likely 2/2 micro emboli.  SLP 7/12 >> Mild aspiration risk. D3 / mech soft, thin liquid diet   Micro Data:  SARS CoV2 7/10 >> negative  MRSA 7/10 >> negative  Antimicrobials:    Interim history/subjective:  Pt reports his wife is in the room behind the curtain.  Eating breakfast.  No acute events overnight. Tmax 99.5.  Objective   Blood pressure 137/65, pulse 99, temperature 99.5 F (37.5 C), temperature source Oral, resp. rate 17, height 5\' 11"  (1.803 m), weight 67.3 kg, SpO2 98 %.        Intake/Output Summary (Last 24 hours) at 07/06/2019 0835 Last data filed at 07/06/2019 0700 Gross per 24 hour  Intake 2077.6 ml  Output 1500 ml  Net 577.6 ml   Filed Weights   07/03/19 0304  Weight: 67.3 kg    Examination: General: elderly male sitting up in bed eating  breakfast   HEENT: MM pink/moist Neuro: alert, oriented to self, place.  Mild delirium but easily reoriented. Right gaze preference.  Left sided visual neglect.  No movement on left / no follow commands.  Appropriate on R.  CV: s1s2 rrr, no m/r/g PULM: even/non-labored, lungs bilaterally clear  QJ:FHLK, non-tender, bsx4 active  Extremities: warm/dry, tract to 1+ pedal edema  Skin: no rashes or lesions  Resolved Hospital Problem list   Acute Respiratory failure  Assessment & Plan:   Acute Embolic R MCA & ACA Infarction s/p Thrombectomy -s/p mechanical thrombectomy with VIR on 5/62 for embolic shower -risk factors include: smoking, ETOH use, malignancy, HLD P: CIR consulted Transition to Eliquis per Neurology  SLP following, appreciate input  Aspiration precautions   RLE DVT -acute DVT of R common femoral vein on [place] 6/27  P: Heparin gtt per pharmacy with transition to oral anticoagulation > switch to Eliquis per Neurology  Hyperlipidemia P: Continue high dose statin as secondary stroke prevention   Hypokalemia P: Monitor, replace as indicated  KVO IVF  Adenocarcinoma of R Lung -has evidence of either micrometastases or multiple small strokes r/t hypercoagulability  P: Followed by Dr. Julien Nordmann   Defer timing of chemotherapy restart to ONC   Best practice:  Diet: D3 diet with thin liquids  Pain/Anxiety/Delirium protocol (if indicated):  VAP protocol (if indicated):   DVT prophylaxis: full dose anticoagulation  GI prophylaxis: PPI Glucose  control: per protocol Mobility: As tolerated  Code Status: Full Family Communication: per primary  Disposition: Ok to transition to PCU 7/13, discussed with Neurology.  PCCM will be available PRN.   Labs   CBC: Recent Labs  Lab 07/01/19 1059  07/03/19 0205 07/03/19 0213 07/03/19 4650 07/04/19 0544 07/05/19 0744 07/06/19 0219  WBC 4.6  --  6.1  --   --  7.2 5.9 6.3  NEUTROABS 3.1  --  4.3  --   --  6.0  --   --   HGB  10.3*   < > 9.6* 9.9* 7.5* 7.7* 7.4* 7.8*  HCT 33.4*  --  30.5* 29.0* 22.0* 24.6* 23.5* 24.1*  MCV 85.6  --  83.8  --   --  86.6 85.1 84.0  PLT 215  --  256  --   --  245 236 267   < > = values in this interval not displayed.    Basic Metabolic Panel: Recent Labs  Lab 07/01/19 1059 07/03/19 0205 07/03/19 0213 07/03/19 0828 07/04/19 0544 07/05/19 0744 07/06/19 0219  NA 136 133* 133* 135 136 137 137  K 4.6 3.5 3.3* 3.7 3.6 3.1* 3.4*  CL 102 100 99  --  106 109 108  CO2 27 22  --   --  20* 19* 22  GLUCOSE 93 124* 128*  --  103* 85 97  BUN 16 16 15   --  12 15 14   CREATININE 0.86 0.89 0.70  --  0.68 0.80 0.72  CALCIUM 8.4* 8.2*  --   --  7.8* 7.9* 8.0*   GFR: Estimated Creatinine Clearance: 85.3 mL/min (by C-G formula based on SCr of 0.72 mg/dL). Recent Labs  Lab 07/03/19 0205 07/04/19 0544 07/05/19 0744 07/06/19 0219  WBC 6.1 7.2 5.9 6.3    Liver Function Tests: Recent Labs  Lab 07/01/19 1059 07/03/19 0205  AST 15 23  ALT 16 14  ALKPHOS 131* 109  BILITOT 0.5 0.6  PROT 6.4* 5.5*  ALBUMIN 2.8* 2.7*   No results for input(s): LIPASE, AMYLASE in the last 168 hours. No results for input(s): AMMONIA in the last 168 hours.  ABG    Component Value Date/Time   PHART 7.424 07/03/2019 0828   PCO2ART 34.1 07/03/2019 0828   PO2ART 224.0 (H) 07/03/2019 0828   HCO3 22.3 07/03/2019 0828   TCO2 23 07/03/2019 0828   ACIDBASEDEF 2.0 07/03/2019 0828   O2SAT 100.0 07/03/2019 0828     Coagulation Profile: Recent Labs  Lab 07/03/19 0205  INR 3.6*    Cardiac Enzymes: No results for input(s): CKTOTAL, CKMB, CKMBINDEX, TROPONINI in the last 168 hours.  HbA1C: Hgb A1c MFr Bld  Date/Time Value Ref Range Status  07/03/2019 05:00 AM 5.7 (H) 4.8 - 5.6 % Final    Comment:    (NOTE)         Prediabetes: 5.7 - 6.4         Diabetes: >6.4         Glycemic control for adults with diabetes: <7.0   12/02/2018 09:06 AM 5.3 <5.7 % of total Hgb Final    Comment:    For the  purpose of screening for the presence of diabetes: . <5.7%       Consistent with the absence of diabetes 5.7-6.4%    Consistent with increased risk for diabetes             (prediabetes) > or =6.5%  Consistent with diabetes . This assay result is  consistent with a decreased risk of diabetes. . Currently, no consensus exists regarding use of hemoglobin A1c for diagnosis of diabetes in children. . According to American Diabetes Association (ADA) guidelines, hemoglobin A1c <7.0% represents optimal control in non-pregnant diabetic patients. Different metrics may apply to specific patient populations.  Standards of Medical Care in Diabetes(ADA). .     CBG: Recent Labs  Lab 07/03/19 0200  [place], NP-C [facility] Pgr: [phone] or if no answer [phone] 07/06/2019, 8:37 AM

## 2019-07-06 NOTE — Progress Notes (Signed)
ANTICOAGULATION CONSULT NOTE - Follow Up Consult  Pharmacy Consult for heparin Indication: DVT and stroke  Labs: Recent Labs    07/04/19 0544  07/04/19 0815 07/04/19 1812 07/05/19 0744 07/05/19 1826 07/06/19 0219  HGB 7.7*  --   --   --  7.4*  --  7.8*  HCT 24.6*  --   --   --  23.5*  --  24.1*  PLT 245  --   --   --  236  --  267  APTT  --   --  53* 50* 70*  --   --   HEPARINUNFRC  --    < > 0.62  --  0.29* 0.28* 0.27*  CREATININE 0.68  --   --   --  0.80  --  0.72   < > = values in this interval not displayed.    Assessment: 68yo male remains subtherapeutic on heparin after rate changes, likely d/t Xarelto clearing; no gtt issues or signs of bleeding per RN.  Goal of Therapy:  Heparin level 0.3-0.5 units/ml   Plan:  Will increase heparin gtt by 1-2 units/kg/hr to 1300 units/hr and check level in 6 hours.    Wynona Neat, PharmD, BCPS  07/06/2019,3:18 AM

## 2019-07-06 NOTE — Progress Notes (Signed)
Wife updated by Dr Erlinda Hong. Wife spoke with pt on my phone.

## 2019-07-06 NOTE — Progress Notes (Signed)
ANTICOAGULATION CONSULT NOTE  Pharmacy Consult:  Heparin >> Eliquis Indication: DVT and stroke  No Known Allergies  Patient Measurements: Height: 5\' 11"  (180.3 cm) Weight: 148 lb 5.9 oz (67.3 kg) IBW/kg (Calculated) : 75.3 Heparin Dosing Weight: 67.3 kg  Vital Signs: Temp: 99.5 F (37.5 C) (07/13 0600) Temp Source: Oral (07/13 0600) BP: 137/65 (07/13 0700) Pulse Rate: 99 (07/13 0700)  Labs: Recent Labs    07/04/19 0544  07/04/19 0815 07/04/19 1812 07/05/19 0744 07/05/19 1826 07/06/19 0219  HGB 7.7*  --   --   --  7.4*  --  7.8*  HCT 24.6*  --   --   --  23.5*  --  24.1*  PLT 245  --   --   --  236  --  267  APTT  --   --  53* 50* 70*  --   --   HEPARINUNFRC  --    < > 0.62  --  0.29* 0.28* 0.27*  CREATININE 0.68  --   --   --  0.80  --  0.72   < > = values in this interval not displayed.    Estimated Creatinine Clearance: 85.3 mL/min (by C-G formula based on SCr of 0.72 mg/dL).   Assessment: 42 YOM admitted as code stroke on 7/10 early am - no tPA as was on Xarelto PTA for a recent DVT on 06/20/19. Patient was found to have R M2 M3 occlusion and went to IR for mechanical thrombectomy on 7/10.  Patient was then transitioned to IV heparin.  He passed the swallow evaluation and Pharmacy now consulted to switch to Eliquis as he has failed Xarelto.    Patient was on day #15/21 of Xarelto load for the recent DVT.  Spoke to Neurology, patient is now in the subacute vs chronic state since his DVT was initially in April, so okay not to reload with Eliquis.  No bleeding reported.  Goal of Therapy:  Full anticoagulation Monitor platelets by anticoagulation protocol: Yes    Plan:  Start Eliquis 5mg  PO BID, stop IV heparin when Eliquis is administered Pharmacy will follow peripherally.  Thank you for the consult!  Tyrah Broers D. Mina Marble, PharmD, BCPS, [place] 07/06/2019, 9:29 AM

## 2019-07-07 ENCOUNTER — Inpatient Hospital Stay (HOSPITAL_COMMUNITY): Payer: Medicare Other

## 2019-07-07 DIAGNOSIS — R509 Fever, unspecified: Secondary | ICD-10-CM

## 2019-07-07 DIAGNOSIS — D638 Anemia in other chronic diseases classified elsewhere: Secondary | ICD-10-CM

## 2019-07-07 LAB — URINALYSIS, COMPLETE (UACMP) WITH MICROSCOPIC
Bacteria, UA: NONE SEEN
Bilirubin Urine: NEGATIVE
Glucose, UA: NEGATIVE mg/dL
Hgb urine dipstick: NEGATIVE
Ketones, ur: NEGATIVE mg/dL
Leukocytes,Ua: NEGATIVE
Nitrite: NEGATIVE
Protein, ur: NEGATIVE mg/dL
Specific Gravity, Urine: 1.018 (ref 1.005–1.030)
pH: 7 (ref 5.0–8.0)

## 2019-07-07 LAB — BASIC METABOLIC PANEL
Anion gap: 7 (ref 5–15)
BUN: 13 mg/dL (ref 8–23)
CO2: 24 mmol/L (ref 22–32)
Calcium: 7.7 mg/dL — ABNORMAL LOW (ref 8.9–10.3)
Chloride: 104 mmol/L (ref 98–111)
Creatinine, Ser: 0.7 mg/dL (ref 0.61–1.24)
GFR calc Af Amer: >60 mL/min (ref >60)
GFR calc non Af Amer: >60 mL/min (ref >60)
Glucose, Bld: 110 mg/dL — ABNORMAL HIGH (ref 70–99)
Potassium: 3.2 mmol/L — ABNORMAL LOW (ref 3.5–5.1)
Sodium: 135 mmol/L (ref 135–145)

## 2019-07-07 LAB — CBC
HCT: 23.5 % — ABNORMAL LOW (ref 39.0–52.0)
Hemoglobin: 7.5 g/dL — ABNORMAL LOW (ref 13.0–17.0)
MCH: 27 pg (ref 26.0–34.0)
MCHC: 31.9 g/dL (ref 30.0–36.0)
MCV: 84.5 fL (ref 80.0–100.0)
Platelets: 194 10*3/uL (ref 150–400)
RBC: 2.78 MIL/uL — ABNORMAL LOW (ref 4.22–5.81)
RDW: 15.8 % — ABNORMAL HIGH (ref 11.5–15.5)
WBC: 5 10*3/uL (ref 4.0–10.5)
nRBC: 0 % (ref 0.0–0.2)

## 2019-07-07 MED ORDER — POTASSIUM CHLORIDE CRYS ER 20 MEQ PO TBCR
40.0000 meq | EXTENDED_RELEASE_TABLET | ORAL | Status: AC
  Administered 2019-07-07 (×2): 40 meq via ORAL
  Filled 2019-07-07 (×2): qty 2

## 2019-07-07 MED ORDER — SODIUM CHLORIDE 0.9 % IV SOLN
250.0000 mL | INTRAVENOUS | Status: DC
  Administered 2019-07-07 – 2019-07-08 (×3): 250 mL via INTRAVENOUS

## 2019-07-07 NOTE — Progress Notes (Signed)
Inpatient Rehabilitation Admissions Coordinator  I observed patient working with PTA and OT. I contacted Dr. Erlinda Hong to discuss patient's temp over last 48 hrs. I await further medical workup before planning to admit pt to inpt rehab.  Danne Baxter, RN, MSN Rehab Admissions Coordinator [phone] 07/07/2019 10:56 AM

## 2019-07-07 NOTE — Care Management Important Message (Signed)
Important Message  Patient Details  Name: Kyle Garcia MRN: [id number] Date of Birth: 01-17-1951   Medicare Important Message Given:  Yes     [name] Circle 07/07/2019, 1:31 PM

## 2019-07-07 NOTE — Progress Notes (Signed)
STROKE TEAM PROGRESS NOTE   INTERVAL HISTORY Pt sitting in bed, lethargic, open eyes with repetitive stimulation. However, even with eye closed, able to answer all orientation questions correctly. Still has dense left hemiplegia. Pt did have low grade fever overnight. UA neg. CXR persistent right lung infiltrates.    Vitals:   07/07/19 0033 07/07/19 0357 07/07/19 0843 07/07/19 1204  BP: 124/68 113/65 110/66 105/72  Pulse: (!) 111 97 (!) 101 (!) 107  Resp: 18 17 17 15   Temp: 99.9 F (37.7 C) 97.7 F (36.5 C) 98 F (36.7 C) 99.4 F (37.4 C)  TempSrc: Oral Oral Oral Oral  SpO2: 96% 99% 98% 100%  Weight:      Height:        CBC:  Recent Labs  Lab 07/03/19 0205  07/04/19 0544  07/06/19 0219 07/07/19 0432  WBC 6.1  --  7.2   < > 6.3 5.0  NEUTROABS 4.3  --  6.0  --   --   --   HGB 9.6*   < > 7.7*   < > 7.8* 7.5*  HCT 30.5*   < > 24.6*   < > 24.1* 23.5*  MCV 83.8  --  86.6   < > 84.0 84.5  PLT 256  --  245   < > 267 194   < > = values in this interval not displayed.    Basic Metabolic Panel:  Recent Labs  Lab 07/06/19 0219 07/07/19 0432  NA 137 135  K 3.4* 3.2*  CL 108 104  CO2 22 24  GLUCOSE 97 110*  BUN 14 13  CREATININE 0.72 0.70  CALCIUM 8.0* 7.7*   Lipid Panel:     Component Value Date/Time   CHOL 117 07/03/2019 0500   TRIG 93 07/03/2019 0500   HDL 29 (L) 07/03/2019 0500   CHOLHDL 4.0 07/03/2019 0500   VLDL 19 07/03/2019 0500   LDLCALC 69 07/03/2019 0500   LDLCALC 99 12/02/2018 0906   HgbA1c:  Lab Results  Component Value Date   HGBA1C 5.7 (H) 07/03/2019   Urine Drug Screen:     Component Value Date/Time   LABOPIA POSITIVE (A) 07/03/2019 0258   COCAINSCRNUR NONE DETECTED 07/03/2019 0258   LABBENZ NONE DETECTED 07/03/2019 0258   AMPHETMU NONE DETECTED 07/03/2019 0258   THCU NONE DETECTED 07/03/2019 0258   LABBARB NONE DETECTED 07/03/2019 0258    Alcohol Level     Component Value Date/Time   ETH <10 07/03/2019 0205    IMAGING  Dg  Swallowing Func-speech Pathology  07/05/2019 DIET RECOMMENDATION 07/05/2019 SLP Diet Recommendations Dysphagia 3 (Mech soft) solids;Thin liquid Liquid Administration via Cup;No straw Medication Administration Whole meds with puree Compensations Slow rate;Small sips/bites;Chin tuck Postural Changes Seated upright at 90 degrees   CHL IP OTHER RECOMMENDATIONS 07/05/2019 Recommended Consults -- Oral Care Recommendations Oral care BID   Cerebral angiogram S/O RT common carotid arteriogram followed complete revascularization of occluded MCA dominant mid division with x2 passes with 58mm x 71mm embotrap and x 3 passes with solitaire x 4 x 40 mm retriever device achieving a TICI 3 revascularization. Also partial revascularization of occluded ACA A2 A3 region with x 1 pass with solitaire X 77mmx 40 mm device.  2D Echocardiogram  06/30/2019  1. The left ventricle has normal systolic function with an ejection fraction of 60-65%. The cavity size was normal. Left ventricular diastolic Doppler parameters are consistent with impaired relaxation. No evidence of left ventricular regional wall  motion abnormalities.  2. The right ventricle has normal systolic function. The cavity was normal. There is no increase in right ventricular wall thickness.  3. Trivial pericardial effusion is present.  4. No evidence of mitral valve stenosis. Mild mitral regurgitation.  5. The aortic valve is tricuspid. No stenosis of the aortic valve.  6. Normal IVC size. No complete TR doppler jet so unable to estimate PA systolic pressure.  Chest x-ray  07/07/2019  Removal of ET tube. Persistent infiltrate in the right lung. Recommend follow-up to resolution.   PHYSICAL EXAM  Temp:  [97.7 F (36.5 C)-100.5 F (38.1 C)] 99.4 F (37.4 C) (07/14 1204) Pulse Rate:  [97-111] 107 (07/14 1204) Resp:  [15-20] 15 (07/14 1204) BP: (104-128)/(59-77) 105/72 (07/14 1204) SpO2:  [96 %-100 %] 100 % (07/14 1204)  General - Well nourished, well  developed, not in acute distress, lethargic.  Ophthalmologic - fundi not visualized due to noncooperation.  Cardiovascular - Regular rhythm, but tachycardia.  Mental Status -  Orientation to time, place, and person were intact. Language including expression, naming, repetition, comprehension was assessed and found intact, however mild dysarthria.  Cranial Nerves II - XII - II - left lower quadrantanopia. III, IV, VI - Extraocular movements intact, no gaze deviation. V - Facial sensation intact bilaterally. VII - left facial droop. VIII - Hearing & vestibular intact bilaterally. X - Palate elevates not able to see well, but mild dysarthria. XI - Chin turning & shoulder shrug intact bilaterally. XII - Tongue protrusion intact.  Motor Strength - The patient's strength was 4/5 RUE and 3/5 RLE but dense hemiplegia on the left UE and LE.  Bulk was normal and fasciculations were absent.   Motor Tone - Muscle tone was assessed at the neck and appendages and was decreased on the left.  Reflexes - The patient's reflexes were symmetrical in all extremities and he had triple reflex on the left.  Sensory - Light touch, temperature/pinprick were assessed and were symmetrical.    Coordination - The patient had normal movements in the right hand with no ataxia or dysmetria.  Tremor was absent.  Gait and Station - deferred.   ASSESSMENT/PLAN Mr. Nikko Goldwire is a 68 y.o. male with history of DVT on Xarelto, adenocarcinoma of the right lung, hyperlipidemia, prior strokes noted on MRI presenting with L sided weakness and R gaze. No tPA d/t xarelto.   Stroke:   Embolic shower right MCA occlusion s/p IR with TICI3 reperfusion- infarct embolic secondary to hypercoagulable state from lung cancer even on Xarelto for DVT. Marantic endocarditis due to advanced malignancy cannot be ruled out.  Code Stroke CT head No acute abnormality.     CTA head & neck ELVO prox R M2/M3 occlusion  CT perfusion  Mod-sized penumbra  Cerebral angio R MCA occlusion, TICI3 revascularization; partial revascularization occluded ACA A2 A3  Post IR CT slight R SAH in R perisylvian fissure and anterior interhemispheric fissure. No gross mass effect.  MRI  Multiple infarcts R parietal and R cingulate gyrus. Numerous punctate L>R parietal due to small emboli  2D Echo EF 60-65%. No source of embolus   TCD w/ bubble negative for PFO  Will not pursue TEE to rule out marantic endocarditis given no change of therapy  LDL - 69  HgbA1c - 5.7  Eliquis for VTE prophylaxis  Xarelto (rivaroxaban) daily prior to admission, now on Eliquis 5mg  bid today  Therapy recommendations:  CIR  Disposition:  pending - transfer to rehab on hold given fever  History of recent stroke also likely related to hypercoagulable state  MRI on 06/10/2019 showed multiple embolic cortical and subcortical bilateral infarcts.    2D echo at that time was unremarkable.   Continued on Xarelto -> now switch to eliquis  Respiratory Failure, resolved  Intubated for IR  Left intubated post IR  Extubated 07/04/2019  Tolerating well  R leg DVT/SVT  Confirmed by doppler 04/15/2019 and 06/20/2019  On xarelto PTA  Treated with heparin IV in hospital, tolerating well  Now on Eliquis 5 bid  Stage 4 adenocarcinoma R lung  On chemo  Follow with Dr. Julien Nordmann  Fever  TM 100.5->100.5  WBC 6.3->5.0  CXR persistent right lung infiltrate  UA neg  Gentle IV hydration @ 40  Hyperlipidemia  Home meds:  pravachol 20  LDL 69, goal < 70  Now on Crestor 20 mg daily  Continue statin at discharge  Dysphagia Malnutrition . Secondary to stroke . Passed barium study  . had cough with food intermittently. SLP reassessed, now on dysphagia 2 diet with honey thick liquid . Added Ensure Enlive . Speech on board   Other Stroke Risk Factors  Advanced age  Former Cigarette smoker, quit 4 mos ago, advised to continue cessation  of smoking  ETOH use, advised to drink no more than 2 drink(s) a day  Other Active Problems  GERD  Anemia due to chronic disease- Hb 9.9-7.7-7.4-7.8-7.5  Hypokalemia - 3.1 -> supplemented - 3.4-supplemented -3.2-supplement - recheck  Hospital day # 4  I spent  35 minutes in total face-to-face time with the patient, more than 50% of which was spent in counseling and coordination of care, reviewing test results, images and medication, and discussing the diagnosis of right brain stroke, stage IV lung cancer, DVT, fever, aspiration pneumonia, treatment plan and potential prognosis. This patient's care requiresreview of multiple databases, neurological assessment, discussion with family, other specialists and medical decision making of high complexity. I had long discussion with wife and daughter over the phone, updated pt current condition, treatment plan and potential prognosis. They expressed understanding and appreciation.    Rosalin Hawking, MD PhD Stroke Neurology 07/07/2019 12:05 PM   To contact Stroke Continuity provider, please refer to [email or web address] After hours, contact General Neurology

## 2019-07-07 NOTE — Evaluation (Signed)
Speech Language Pathology Evaluation Patient Details Name: Kyle Garcia MRN: [id number] DOB: [birth year] Today's Date: 07/07/2019 Time: 4403-4742 SLP Time Calculation (min) (ACUTE ONLY): 12 min  Problem List:  Patient Active Problem List   Diagnosis Date Noted  . Acute right arterial ischemic stroke, middle cerebral artery (MCA) ([place]) 07/03/2019  . Middle cerebral artery embolism, right 07/03/2019  . CAP (community acquired pneumonia) 06/20/2019  . Stroke ([place])   . Stroke due to embolism ([place]) 06/17/2019  . Encounter for antineoplastic chemotherapy 06/05/2019  . Encounter for antineoplastic immunotherapy 06/05/2019  . Goals of care, counseling/discussion 06/05/2019  . Adenocarcinoma of right lung, stage 4 ([place]) 06/04/2019  . Cough 05/21/2019  . S/P bronchoscopy with biopsy   . DVT (deep venous thrombosis) ([place]) 05/08/2019  . Weight loss 04/28/2019  . Multifocal pneumonia 04/10/2019  . GERD without esophagitis 11/14/2015  . Hyperlipidemia 11/14/2015  . Benign prostatic hyperplasia 12/24/2008   Past Medical History:  Past Medical History:  Diagnosis Date  . Cancer ([place])    stage 4 adenocarcinoma right lung  . GERD (gastroesophageal reflux disease)   . Hyperlipidemia   . Stroke [facility])    25-30 emboli seen on screening MRI   Past Surgical History:  Past Surgical History:  Procedure Laterality Date  . CATARACT EXTRACTION  2016  . EYE SURGERY    . IR CT HEAD LTD  07/03/2019  . IR PERCUTANEOUS ART THROMBECTOMY/INFUSION INTRACRANIAL INC DIAG ANGIO  07/03/2019  . RADIOLOGY WITH ANESTHESIA N/A 07/03/2019   Procedure: IR WITH ANESTHESIA;  Surgeon: Luanne Bras, MD;  Location: [place];  Service: Radiology;  Laterality: N/A;  . VIDEO BRONCHOSCOPY Bilateral 05/21/2019   Procedure: VIDEO BRONCHOSCOPY WITH FLUORO;  Surgeon: Collene Gobble, MD;  Location: [facility];  Service: Cardiopulmonary;  Laterality: Bilateral;   HPI:  68yo M with history of R DVT on Xarelto, adenocarcinoma  of the R lung, prior strokes on imaging, HLD, and GERD, who p/w left-sided hemiplegia and R gaze deviation. Found to have R MCA and ACA occlusions. Now s/p mechanical thrombectomy with VIR on 7/10.    Assessment / Plan / Recommendation Clinical Impression  Pt exhibits cognitive impairments associated with right hemisphere ACA and MCA stroke including decreased awareness, problem solving, insight, significant left neglect and memory. He demonstrates a flat affect and monotone expression. Pt states reason for admission, place and month/year and exhibited difficulty with day of week with cues x 2. Multiple times he spoke to "[place]" (wife) who he thought was present despite reminders after several minute delay. ST will see pt while on acute care for cognition. He would benefit from inpatient rehab admission to facilitate cognitive independence.         SLP Assessment  SLP Recommendation/Assessment: Patient needs continued Speech Lanaguage Pathology Services SLP Visit Diagnosis: Cognitive communication deficit (R41.841)    Follow Up Recommendations  Inpatient Rehab    Frequency and Duration min 2x/week  2 weeks      SLP Evaluation Cognition  Overall Cognitive Status: Impaired/Different from baseline Arousal/Alertness: Awake/alert Orientation Level: Oriented to person;Oriented to place;Oriented to situation(oriented to month and year) Attention: Sustained Sustained Attention: Impaired Sustained Attention Impairment: Verbal basic;Functional basic Memory: Impaired Memory Impairment: Decreased recall of new information Awareness: Impaired Awareness Impairment: Intellectual impairment;Emergent impairment Problem Solving: Impaired Problem Solving Impairment: Verbal basic;Functional basic Safety/Judgment: Impaired       Comprehension  Auditory Comprehension Overall Auditory Comprehension: Appears within functional limits for tasks assessed Interfering Components: Attention;Visual  impairments;Working Technical brewer Discrimination:  Not tested Reading Comprehension Reading Status: (TBA)    Expression Expression Primary Mode of Expression: Verbal Verbal Expression Overall Verbal Expression: Appears within functional limits for tasks assessed Initiation: No impairment Level of Generative/Spontaneous Verbalization: Conversation Repetition: (NT) Naming: No impairment Pragmatics: Impairment Impairments: Abnormal affect;Eye contact;Monotone Interfering Components: Attention Written Expression Dominant Hand: Right Written Expression: (TBA)   Oral / Motor  Oral Motor/Sensory Function Overall Oral Motor/Sensory Function: Moderate impairment Facial ROM: Reduced left;Suspected CN VII (facial) dysfunction Facial Symmetry: Abnormal symmetry left;Suspected CN VII (facial) dysfunction Facial Strength: Reduced left Lingual ROM: Within Functional Limits Lingual Symmetry: Within Functional Limits Lingual Strength: Reduced Velum: Within Functional Limits Mandible: Within Functional Limits Motor Speech Overall Motor Speech: Appears within functional limits for tasks assessed Respiration: Within functional limits Phonation: Normal Resonance: Within functional limits Articulation: Within functional limitis Intelligibility: Intelligible Motor Planning: Witnin functional limits   GO                    Houston Siren 07/07/2019, 4:11 PM  Orbie Pyo Colvin Caroli.Ed Risk analyst [phone] Office [phone]

## 2019-07-07 NOTE — Progress Notes (Signed)
Pt has A line, sheath, and cuff, none of which are correlating and at times, all three are out of BP range.  Cuff and sheath are reading below parameters, and A line reading above.  A line very positional and at times does not read at all.  Dr. Lynetta Mare notified, and his response was that to augment his BP per the A line to 140 with Neosynephrine and to attempt to achieve a better neurological exam.  A line not working properly and multiple adjustments had to be made to achieve an acceptable wave.

## 2019-07-07 NOTE — Progress Notes (Addendum)
Occupational Therapy Treatment Patient Details Name: Kyle Garcia MRN: [id number] DOB: [birth year] Today's Date: 07/07/2019    History of present illness Patient is a 68 y/o male who presents with left sided weakness. Head CT-Positive CTA for emergent large vessel occlusion, with proximal right M2/M3 occlusion. Brain MRI- large right MCA/ACA infarct with smaller infarcts consistent with embolic phenomenon (51-76 infarcts seen). Now s/p mechanical thrombectomy on 7/10. Recent diagnosis of RLE DVT (6/27) on Xarelto, HLD and stage IV adenocarcinoma of the lung in May 2020.   OT comments  Pt with increased pushing tendencies this session requiring mod-max assist +2 for sitting balance.  Unsafe to engage in any sit > stand or OOB transfers due to increased pushing and decreased sustained attention.  Utilized WB through RUE to decrease pushing with pt unable to sustain attention to task, due to perseverating on something to drink.  Max multimodal cues for vision to facilitate visual scanning and attention to Lt environment, minimal scanning to midline despite assist/cues.  Pt oriented throughout session and willing to participate, would continue to be an excellent candidate for CIR once medically stable.    Follow Up Recommendations  CIR;Supervision/Assistance - 24 hour    Equipment Recommendations  Other (comment)(TBD next venue)       Precautions / Restrictions Precautions Precautions: Fall Precaution Comments: pushing Restrictions Weight Bearing Restrictions: No       Mobility Bed Mobility Overal bed mobility: Needs Assistance Bed Mobility: Rolling;Supine to Sit;Sit to Supine Rolling: Max assist;+2 for safety/equipment   Supine to sit: Max assist;+2 for physical assistance Sit to supine: Max assist;+2 for safety/equipment   General bed mobility comments: Hand over hand to facilitate reaching for bed rail, pt requiring increased time and cues due to decreased sustained attention to task  and Lt inattention  Transfers Overall transfer level: Needs assistance   Transfers: Lateral/Scoot Transfers          Lateral/Scoot Transfers: +2 physical assistance;Max assist General transfer comment: unsafe to complete transfer OOB this session due to increase in pushing, therefore engaged in lateral scoots x2 along EOB with partial sit > stand +2    Balance Overall balance assessment: Needs assistance Sitting-balance support: Single extremity supported;Feet supported Sitting balance-Leahy Scale: Poor Sitting balance - Comments: While seated EOB, engaged in WB through Rt elbow to minimize pushing tendencies, however pt unable to maintain position often pushing back to Lt or leaning forward.  Required +2 for sitting balance due to strong pushing to Lt. Postural control: Left lateral lean                                 ADL either performed or assessed with clinical judgement   ADL Overall ADL's : Needs assistance/impaired Eating/Feeding: Cueing for safety;Minimal assistance Eating/Feeding Details (indicate cue type and reason): cues for chin tuck                                 Functional mobility during ADLs: +2 for physical assistance;+2 for safety/equipment General ADL Comments: Pt with increased pushing tendencies this session, requiring +2 for upright sitting balance.  Unsafe to complete any transfers this session, did complete lateral scoot towards HOB with max assist +2     Vision   Vision Assessment?: Vision impaired- to be further tested in functional context Additional Comments: Pt with Rt gaze preference, able to scan to  midline 1x with increased time and max cues.  Unsure if pt even focusing on stimulus in Lt visual field          Cognition Arousal/Alertness: Awake/alert Behavior During Therapy: Flat affect Overall Cognitive Status: Impaired/Different from baseline Area of Impairment: Safety/judgement;Following  commands;Awareness;Problem solving;Attention                   Current Attention Level: Sustained   Following Commands: Follows one step commands with increased time;Follows one step commands inconsistently Safety/Judgement: Decreased awareness of deficits;Decreased awareness of safety Awareness: Emergent Problem Solving: Decreased initiation;Difficulty sequencing;Requires verbal cues;Requires tactile cues General Comments: A&Ox4. Pt with right gaze preference. Perseverating on something to drink                   Pertinent Vitals/ Pain       Pain Assessment: Faces Faces Pain Scale: Hurts little more Pain Location: RLE Pain Intervention(s): Monitored during session;Limited activity within patient's tolerance         Frequency  Min 3X/week        Progress Toward Goals  OT Goals(current goals can now be found in the care plan section)  Progress towards OT goals: Progressing toward goals  Acute Rehab OT Goals Patient Stated Goal: to go home OT Goal Formulation: With patient Time For Goal Achievement: 07/19/19 Potential to Achieve Goals: Good  Plan Discharge plan remains appropriate    Co-evaluation    PT/OT/SLP Co-Evaluation/Treatment: Yes Reason for Co-Treatment: Complexity of the patient's impairments (multi-system involvement)   OT goals addressed during session: ADL's and self-care      AM-PAC OT "6 Clicks" Daily Activity     Outcome Measure   Help from another person eating meals?: A Lot Help from another person taking care of personal grooming?: A Lot Help from another person toileting, which includes using toliet, bedpan, or urinal?: Total Help from another person bathing (including washing, rinsing, drying)?: A Lot Help from another person to put on and taking off regular upper body clothing?: Total Help from another person to put on and taking off regular lower body clothing?: Total 6 Click Score: 9    End of Session Equipment Utilized  During Treatment: Gait belt  OT Visit Diagnosis: Unsteadiness on feet (R26.81);Other abnormalities of gait and mobility (R26.89);Muscle weakness (generalized) (M62.81);Low vision, both eyes (H54.2);Other symptoms and signs involving cognitive function;Hemiplegia and hemiparesis Hemiplegia - Right/Left: Left Hemiplegia - dominant/non-dominant: Non-Dominant Hemiplegia - caused by: Cerebral infarction   Activity Tolerance Patient tolerated treatment well   Patient Left in bed;with call bell/phone within reach;with bed alarm set;with nursing/sitter in room   Nurse Communication Mobility status(request for something to drink)        Time: 2992-4268 OT Time Calculation (min): 40 min  Charges: OT General Charges $OT Visit: 1 Visit OT Treatments $Neuromuscular Re-education: 23-37 mins    Simonne Come, [phone] 07/07/2019, 11:23 AM

## 2019-07-07 NOTE — H&P (Signed)
Physical Medicine and Rehabilitation Admission H&P    Chief Complaint  Patient presents with   Code Stroke  : HPI: [name] is a 68 year old right-handed male with history of recent right lower extremity DVT confirmed by Doppler 04/15/2019 maintained on Xarelto as well as multiple embolic cortical and subcortical bilateral infarcts identified on MRI from most recent admission in June 2020 during work-up of DVT, chronic anemia, stage IV adenocarcinoma of the right lung followed by [name] started on chemotherapy 06/17/2019, hyperlipidemia, patient quit smoking 4 months ago.  Per chart review patient lives with spouse.  1 level home 2 steps to entry.  Independent and retired.  Presented 07/02/2013 with left side weakness, right gaze deviation.  Cranial CT/CTA positive for emergent large vessel occlusion with proximal right M2-M3 occlusion.  No TPA as patient was on Xarelto.  Patient underwent cerebral angiogram followed by revascularization of occluded MCA dominant mid division with x2 passes also partial revascularization of occluded ACA A2-A3 region per interventional radiology.  Patient did require ventilatory support for a short time.  Follow-up MRI after revascularization shows multiple areas of acute infarction right parietal region and numerous punctate acute infarcts in both parietal regions left more than right.  Recent echocardiogram with ejection fraction of 38% normal systolic function.  No source of embolus.  Neurology follow-up patient initially on intravenous heparin and transitioned to Eliquis.  No plan for TEE and advised to continue Eliquis.  Patient with low-grade fever on 07/07/2019 with chest x-ray completed showing persistent infiltrate right lung and was placed on Unasyn.  Urine study negative nitrite.  Presently on a dysphagia #2 honey thick liquid diet.  Therapy evaluation completed and patient was admitted for a comprehensive rehab program.  Review of Systems   Constitutional: Positive for fever. Negative for chills.  HENT: Negative for hearing loss.   Eyes: Negative for blurred vision and double vision.  Respiratory: Negative for cough and shortness of breath.   Cardiovascular: Positive for leg swelling. Negative for chest pain and palpitations.  Gastrointestinal: Positive for constipation. Negative for heartburn, nausea and vomiting.       GERD  Genitourinary: Positive for urgency. Negative for dysuria, flank pain and hematuria.  Musculoskeletal: Positive for joint pain.  Skin: Negative for rash.  Neurological: Positive for weakness.  All other systems reviewed and are negative.  Past Medical History:  Diagnosis Date   Cancer [facility])    stage 4 adenocarcinoma right lung   GERD (gastroesophageal reflux disease)    Hyperlipidemia    Stroke ([place])    25-30 emboli seen on screening MRI   Past Surgical History:  Procedure Laterality Date   CATARACT EXTRACTION  2016   EYE SURGERY     IR CT HEAD LTD  07/03/2019   IR PERCUTANEOUS ART THROMBECTOMY/INFUSION INTRACRANIAL INC DIAG ANGIO  07/03/2019   RADIOLOGY WITH ANESTHESIA N/A 07/03/2019   Procedure: IR WITH ANESTHESIA;  Surgeon: Luanne Bras, MD;  Location: [facility];  Service: Radiology;  Laterality: N/A;   VIDEO BRONCHOSCOPY Bilateral 05/21/2019   Procedure: VIDEO BRONCHOSCOPY WITH FLUORO;  Surgeon: Collene Gobble, MD;  Location: [facility] ENDOSCOPY;  Service: Cardiopulmonary;  Laterality: Bilateral;   No family history on file. Social History:  reports that he quit smoking about 5 months ago. His smoking use included cigars. He has never used smokeless tobacco. He reports current alcohol use of about 4.0 - 7.0 standard drinks of alcohol per week. He reports that he does not use drugs. Allergies: No  Known Allergies Medications Prior to Admission  Medication Sig Dispense Refill   acetaminophen (TYLENOL) 500 MG tablet Take 1,000 mg by mouth every 6 (six) hours as needed for headache.      Docusate Sodium 100 MG capsule Take 100 mg by mouth 2 (two) times daily.     folic acid (FOLVITE) 1 MG tablet Take 1 tablet (1 mg total) by mouth daily. 30 tablet 4   HYDROcodone-homatropine (HYCODAN) 5-1.5 MG/5ML syrup Take 5 mLs by mouth every 6 (six) hours as needed for cough. 120 mL 0   Multiple Vitamins-Minerals (MULTIVITAMIN WITH MINERALS) tablet Take 1 tablet by mouth daily.      Multiple Vitamins-Minerals (PRESERVISION AREDS 2) CAPS Take 1 capsule by mouth 2 (two) times daily.     omeprazole (PRILOSEC) 40 MG capsule Take 1 capsule (40 mg total) by mouth daily. 90 capsule 3   Polyvinyl Alcohol-Povidone (REFRESH OP) Place 1 drop into both eyes every evening.     pravastatin (PRAVACHOL) 20 MG tablet TAKE 1 TABLET DAILY AT 6 PM (Patient taking differently: Take 20 mg by mouth daily at 6 PM. ) 90 tablet 1   prochlorperazine (COMPAZINE) 10 MG tablet Take 1 tablet (10 mg total) by mouth every 6 (six) hours as needed for nausea or vomiting. 30 tablet 0   Rivaroxaban 15 & 20 MG TBPK Take as directed on package: Start with one 15mg  tablet by mouth twice a day with food. On Day 22, switch to one 20mg  tablet once a day with food. (Patient taking differently: Take 15-20 mg by mouth See admin instructions. Take as directed on package: Start with one 15mg  tablet by mouth twice a day with food. On Day 22, switch to one 20mg  tablet once a day with food.) 51 each 0    Drug Regimen Review Drug regimen was reviewed and remains appropriate with no significant issues identified  Home: Home Living Family/patient expects to be discharged to:: Inpatient rehab Living Arrangements: Spouse/significant other Available Help at Discharge: Family Type of Home: House Home Access: Stairs to enter Technical brewer of Steps: 2 Entrance Stairs-Rails: Right Home Layout: One level  Lives With: Spouse   Functional History: Prior Function Level of Independence: Independent Comments:  Retired.  Functional Status:  Mobility: Bed Mobility Overal bed mobility: Needs Assistance Bed Mobility: Rolling, Supine to Sit, Sit to Supine Rolling: Max assist, +2 for safety/equipment Supine to sit: Max assist, +2 for physical assistance Sit to supine: Max assist, +2 for safety/equipment General bed mobility comments: Hand over hand to facilitate reaching for bed rail, pt requiring increased time and cues due to decreased sustained attention to task and Lt inattention Transfers Overall transfer level: Needs assistance Transfers: Lateral/Scoot Transfers Sit to Stand: (partial stand to place new alarm pad under him) Squat pivot transfers: +2 physical assistance, Max assist  Lateral/Scoot Transfers: +2 physical assistance, Max assist General transfer comment: unsafe to complete transfer OOB this session due to increase in pushing, therefore engaged in lateral scoots x2 along EOB with partial sit > stand +2 Ambulation/Gait General Gait Details: Unable    ADL: ADL Overall ADL's : Needs assistance/impaired Eating/Feeding: Cueing for safety, Minimal assistance Eating/Feeding Details (indicate cue type and reason): cues for chin tuck Grooming: Wash/dry face, Minimal assistance Grooming Details (indicate cue type and reason): using [place] on his own Upper Body Bathing: Moderate assistance, Bed level Lower Body Bathing: Total assistance, Bed level Upper Body Dressing : Maximal assistance, Bed level Lower Body Dressing: Total assistance, Bed level  Toilet Transfer: Moderate assistance, +2 for physical assistance Toilet Transfer Details (indicate cue type and reason): lateral scoot from bed to drop arm recliner going to pt's right Toileting- Clothing Manipulation and Hygiene: Total assistance Toileting - Clothing Manipulation Details (indicate cue type and reason): Max A partial stand Functional mobility during ADLs: +2 for physical assistance, +2 for safety/equipment General ADL  Comments: Pt with increased pushing tendencies this session, requiring +2 for upright sitting balance.  Unsafe to complete any transfers this session, did complete lateral scoot towards HOB with max assist +2  Cognition: Cognition Overall Cognitive Status: Impaired/Different from baseline Arousal/Alertness: Awake/alert Orientation Level: Oriented X4 Attention: Sustained Sustained Attention: Impaired Sustained Attention Impairment: Verbal basic, Functional basic Memory: Impaired Memory Impairment: Decreased recall of new information Awareness: Impaired Awareness Impairment: Intellectual impairment, Emergent impairment Problem Solving: Impaired Problem Solving Impairment: Verbal basic, Functional basic Safety/Judgment: Impaired Cognition Arousal/Alertness: Awake/alert Behavior During Therapy: Flat affect Overall Cognitive Status: Impaired/Different from baseline Area of Impairment: Safety/judgement, Following commands, Awareness, Problem solving, Attention Current Attention Level: Sustained Following Commands: Follows one step commands with increased time, Follows one step commands inconsistently Safety/Judgement: Decreased awareness of deficits, Decreased awareness of safety Awareness: Emergent Problem Solving: Decreased initiation, Difficulty sequencing, Requires verbal cues, Requires tactile cues General Comments: A&Ox4. Pt with right gaze preference. Perseverating on something to drink  Physical Exam: Blood pressure 112/66, pulse 98, temperature 98.7 F (37.1 C), temperature source Oral, resp. rate 18, height 5\' 11"  (1.803 m), weight 67.3 kg, SpO2 97 %. Physical Exam  Constitutional: No distress.  HENT:  Head: Atraumatic.  Eyes: Right eye exhibits no discharge. Left eye exhibits no discharge.  Neck: Normal range of motion.  Cardiovascular: Regular rhythm. Exam reveals no gallop.  No murmur heard. Respiratory: Effort normal. No respiratory distress. He has no wheezes.  GI:  Soft. He exhibits no distension. There is no abdominal tenderness.  Neurological:   Pt arouses, dysarthric right left central 7 and left lower quadrantanopia, RUE and RLE 3+ to 4/5. LUE and LLE with dense HP. Senses pain and light touch in al 4 limbs. Decreased insight and awareness. Oriented to person, hospital, month/year  Skin: Skin is warm. He is not diaphoretic. No erythema.  Psychiatric:  Flat, slow to engage    Results for orders placed or performed during the hospital encounter of 07/03/19 (from the past 48 hour(s))  Urinalysis, Complete w Microscopic     Status: Abnormal   Collection Time: 07/07/19  3:46 PM  Result Value Ref Range   Color, Urine YELLOW YELLOW   APPearance HAZY (A) CLEAR   Specific Gravity, Urine 1.018 1.005 - 1.030   pH 7.0 5.0 - 8.0   Glucose, UA NEGATIVE NEGATIVE mg/dL   Hgb urine dipstick NEGATIVE NEGATIVE   Bilirubin Urine NEGATIVE NEGATIVE   Ketones, ur NEGATIVE NEGATIVE mg/dL   Protein, ur NEGATIVE NEGATIVE mg/dL   Nitrite NEGATIVE NEGATIVE   Leukocytes,Ua NEGATIVE NEGATIVE   RBC / HPF 0-5 0 - 5 RBC/hpf   WBC, UA 0-5 0 - 5 WBC/hpf   Bacteria, UA NONE SEEN NONE SEEN   Squamous Epithelial / LPF 0-5 0 - 5   Mucus PRESENT     Comment: Performed at [facility], [address], [place], [place] [address]  Basic metabolic panel     Status: Abnormal   Collection Time: 07/08/19  6:38 AM  Result Value Ref Range   Sodium 133 (L) 135 - 145 mmol/L   Potassium 4.1 3.5 - 5.1  mmol/L   Chloride 102 98 - 111 mmol/L   CO2 23 22 - 32 mmol/L   Glucose, Bld 105 (H) 70 - 99 mg/dL   BUN 13 8 - 23 mg/dL   Creatinine, Ser 0.77 0.61 - 1.24 mg/dL   Calcium 7.7 (L) 8.9 - 10.3 mg/dL   GFR calc non Af Amer >60 >60 mL/min   GFR calc Af Amer >60 >60 mL/min   Anion gap 8 5 - 15    Comment: Performed at [facility] [address], [place], [place] [address]  CBC     Status: Abnormal   Collection Time: 07/08/19  6:38 AM  Result Value Ref Range   WBC 4.8 4.0  - 10.5 K/uL   RBC 2.97 (L) 4.22 - 5.81 MIL/uL   Hemoglobin 8.0 (L) 13.0 - 17.0 g/dL   HCT 25.3 (L) 39.0 - 52.0 %   MCV 85.2 80.0 - 100.0 fL   MCH 26.9 26.0 - 34.0 pg   MCHC 31.6 30.0 - 36.0 g/dL   RDW 16.1 (H) 11.5 - 15.5 %   Platelets 151 150 - 400 K/uL   nRBC 0.0 0.0 - 0.2 %    Comment: Performed at [facility], [place] [address], [place], [place] [address]  CBC     Status: Abnormal   Collection Time: 07/09/19  4:38 AM  Result Value Ref Range   WBC 4.9 4.0 - 10.5 K/uL   RBC 2.89 (L) 4.22 - 5.81 MIL/uL   Hemoglobin 7.8 (L) 13.0 - 17.0 g/dL   HCT 25.0 (L) 39.0 - 52.0 %   MCV 86.5 80.0 - 100.0 fL   MCH 27.0 26.0 - 34.0 pg   MCHC 31.2 30.0 - 36.0 g/dL   RDW 15.9 (H) 11.5 - 15.5 %   Platelets 138 (L) 150 - 400 K/uL   nRBC 0.0 0.0 - 0.2 %    Comment: Performed at [facility], [place] [address] [place], [place] [address]  Basic metabolic panel     Status: Abnormal   Collection Time: 07/09/19  4:38 AM  Result Value Ref Range   Sodium 134 (L) 135 - 145 mmol/L   Potassium 3.8 3.5 - 5.1 mmol/L   Chloride 104 98 - 111 mmol/L   CO2 22 22 - 32 mmol/L   Glucose, Bld 117 (H) 70 - 99 mg/dL   BUN 16 8 - 23 mg/dL   Creatinine, Ser 0.85 0.61 - 1.24 mg/dL   Calcium 7.7 (L) 8.9 - 10.3 mg/dL   GFR calc non Af Amer >60 >60 mL/min   GFR calc Af Amer >60 >60 mL/min   Anion gap 8 5 - 15    Comment: Performed at [facility], [place] [address], [place], [place] [address]   Dg Chest Port 1 View  Result Date: 07/07/2019 CLINICAL DATA:  Cough.  History of stroke. EXAM: PORTABLE CHEST 1 VIEW COMPARISON:  July 03, 2019 FINDINGS: The ET tube is been removed. No pneumothorax. Persistent infiltrate in the right mid to lower lung. No other interval changes. IMPRESSION: Removal of ET tube. Persistent infiltrate in the right lung. Recommend follow-up to resolution. Electronically Signed   By: Dorise Bullion III M.D   On: 07/07/2019 12:33   Dg Swallowing Func-speech Pathology  Result Date:  07/08/2019 Objective Swallowing Evaluation: Type of Study: MBS-Modified Barium Swallow Study  Patient Details Name: Kyle Garcia MRN: [id number] Date of Birth: 29-Jun-1951 Today's Date: 07/08/2019 Time: SLP Start Time (  ACUTE ONLY): 1303 -SLP Stop Time (ACUTE ONLY): 1323 SLP Time Calculation (min) (ACUTE ONLY): 20 min Past Medical History: Past Medical History: Diagnosis Date  Cancer ([place])   stage 4 adenocarcinoma right lung  GERD (gastroesophageal reflux disease)   Hyperlipidemia   Stroke ([place])   25-30 emboli seen on screening MRI Past Surgical History: Past Surgical History: Procedure Laterality Date  CATARACT EXTRACTION  2016  EYE SURGERY    IR CT HEAD LTD  07/03/2019  IR PERCUTANEOUS ART THROMBECTOMY/INFUSION INTRACRANIAL INC DIAG ANGIO  07/03/2019  RADIOLOGY WITH ANESTHESIA N/A 07/03/2019  Procedure: IR WITH ANESTHESIA;  Surgeon: Luanne Bras, MD;  Location: [facility];  Service: Radiology;  Laterality: N/A;  VIDEO BRONCHOSCOPY Bilateral 05/21/2019  Procedure: VIDEO BRONCHOSCOPY WITH FLUORO;  Surgeon: Collene Gobble, MD;  Location: [facility] ENDOSCOPY;  Service: Cardiopulmonary;  Laterality: Bilateral; HPI: 68yo M with history of R DVT on Xarelto, adenocarcinoma of the R lung, prior strokes on imaging, HLD, and GERD, who p/w left-sided hemiplegia and R gaze deviation. Found to have R MCA and ACA occlusions. Now s/p mechanical thrombectomy with VIR on 7/10. MBS 7/12 recommended nectar with chin tuck. Pt has exhibited clinical signs of aspiration with nectar thick using chin tuck. Repeat MBS warranted.    Subjective: Pt awake, alert, pleasant, participative Assessment / Plan / Recommendation CHL IP CLINICAL IMPRESSIONS 07/08/2019 Clinical Impression Pt's repeat MBS today was similar to prior on 7/12 with possibly slight improvements in motor strength. He continues to aspirate thin liquids and penetrate nectar with head in neutral. Today laryngeal elevation and closure appeared adequate but timing and coordination and  closure was suboptimal. Thin and nectar (with straw) boluses entered laryngeal vestibule prior to complete epiglottic deflection in addition to large volume with nectar. Verbal cues for smaller sips using straw were safe. He was hypersensitive to even small amount of penetration and produced strong reflexive cough to clear penetrates. There was mild-mod amounts of vallecular and pyriform sinus residue. Oral phase marked decreased cohesion with anterior spill and lingual pumping. Positioning tricky due to kyphosis/cervical rigidity and head in mostly static flexed and right rotation. He was mildy more lethargic today and has been febrile for 3 days, therefore will continue honey thick liquids/Dys 2 texture and ST intervention for diet modification (likely return to nectar with clinical observation only) and trial of RMT (respiratory muscle strength training).    SLP Visit Diagnosis Dysphagia, oropharyngeal phase (R13.12) Attention and concentration deficit following -- Frontal lobe and executive function deficit following -- Impact on safety and function Moderate aspiration risk   CHL IP TREATMENT RECOMMENDATION 07/08/2019 Treatment Recommendations Therapy as outlined in treatment plan below   Prognosis 07/08/2019 Prognosis for Safe Diet Advancement Good Barriers to Reach Goals Cognitive deficits;Severity of deficits Barriers/Prognosis Comment -- CHL IP DIET RECOMMENDATION 07/08/2019 SLP Diet Recommendations Honey thick liquids;Dysphagia 2 (Fine chop) solids Liquid Administration via Cup;No straw Medication Administration Crushed with puree Compensations Small sips/bites;Slow rate Postural Changes Seated upright at 90 degrees   CHL IP OTHER RECOMMENDATIONS 07/08/2019 Recommended Consults -- Oral Care Recommendations Oral care BID Other Recommendations --   CHL IP FOLLOW UP RECOMMENDATIONS 07/08/2019 Follow up Recommendations Inpatient Rehab   CHL IP FREQUENCY AND DURATION 07/08/2019 Speech Therapy Frequency (ACUTE ONLY)  min 2x/week Treatment Duration 2 weeks      CHL IP ORAL PHASE 07/08/2019 Oral Phase Impaired Oral - Pudding Teaspoon -- Oral - Pudding Cup -- Oral - Honey Teaspoon -- Oral - Honey Cup Left anterior bolus loss;Delayed  oral transit;Decreased bolus cohesion;Lingual pumping Oral - Nectar Teaspoon -- Oral - Nectar Cup Left anterior bolus loss;Delayed oral transit;Decreased bolus cohesion;Lingual pumping Oral - Nectar Straw Left anterior bolus loss;Delayed oral transit;Decreased bolus cohesion Oral - Thin Teaspoon -- Oral - Thin Cup Left anterior bolus loss;Delayed oral transit;Decreased bolus cohesion;Lingual pumping Oral - Thin Straw -- Oral - Puree NT Oral - Mech Soft -- Oral - Regular Left anterior bolus loss;Delayed oral transit;Decreased bolus cohesion Oral - Multi-Consistency -- Oral - Pill -- Oral Phase - Comment --  CHL IP PHARYNGEAL PHASE 07/08/2019 Pharyngeal Phase Impaired Pharyngeal- Pudding Teaspoon -- Pharyngeal -- Pharyngeal- Pudding Cup -- Pharyngeal -- Pharyngeal- Honey Teaspoon -- Pharyngeal -- Pharyngeal- Honey Cup Pharyngeal residue - valleculae;Pharyngeal residue - pyriform;Penetration/Aspiration during swallow Pharyngeal Material enters airway, remains ABOVE vocal cords and not ejected out Pharyngeal- Nectar Teaspoon -- Pharyngeal -- Pharyngeal- Nectar Cup Penetration/Aspiration during swallow Pharyngeal Material enters airway, CONTACTS cords and then ejected out Pharyngeal- Nectar Straw Penetration/Aspiration during swallow;Pharyngeal residue - pyriform;Pharyngeal residue - valleculae Pharyngeal Material enters airway, remains ABOVE vocal cords and not ejected out Pharyngeal- Thin Teaspoon -- Pharyngeal -- Pharyngeal- Thin Cup Penetration/Aspiration during swallow Pharyngeal Material enters airway, passes BELOW cords and not ejected out despite cough attempt by patient Pharyngeal- Thin Straw NT Pharyngeal -- Pharyngeal- Puree NT Pharyngeal -- Pharyngeal- Mechanical Soft -- Pharyngeal -- Pharyngeal-  Regular WFL Pharyngeal -- Pharyngeal- Multi-consistency -- Pharyngeal -- Pharyngeal- Pill -- Pharyngeal -- Pharyngeal Comment --  CHL IP CERVICAL ESOPHAGEAL PHASE 07/08/2019 Cervical Esophageal Phase WFL Pudding Teaspoon -- Pudding Cup -- Honey Teaspoon -- Honey Cup -- Nectar Teaspoon -- Nectar Cup -- Nectar Straw -- Thin Teaspoon -- Thin Cup -- Thin Straw -- Puree -- Mechanical Soft -- Regular -- Multi-consistency -- Pill -- Cervical Esophageal Comment -- Houston Siren 07/08/2019, 3:15 PM Orbie Pyo Litaker M.Ed Risk analyst [phone] Office [phone]                  Medical Problem List and Plan: 1.  Dense left hemiparesis with dysphagia secondary to embolic shower right MCA occlusion status post reperfusion.  Infarct felt to be embolic secondary to hypercoagulable state from lung cancer  -admit to inpatient rehab 2.  Antithrombotics: -DVT/anticoagulation/history of right lower extremity DVT: Eliquis  -antiplatelet therapy: N/A 3. Pain Management: Tylenol as needed 4. Mood: Provide emotional support  -antipsychotic agents: N/A 5. Neuropsych: This patient is capable of making decisions on his own behalf. 6. Skin/Wound Care: Routine skin checks 7. Fluids/Electrolytes/Nutrition: Routine in and outs with follow-up chemistries 8.  Stage IV adenocarcinoma right lung.  Chemotherapy as per [name] 9.  Aspiration pneumonia.  Continue Unasyn initiated 7/15/ 2020 x 5-day course.   -aspiration precautions 10.  Dysphagia.  Dysphasia #2 honey thick liquids.  Follow-up speech therapy. Advance diet as possible. 11.  Hyperlipidemia.  Crestor 12.  Remote tobacco abuse.  Counseling 13.  Acute on chronic anemia.  Follow-up CBC     Cathlyn Parsons, PA-C 07/09/2019

## 2019-07-07 NOTE — Progress Notes (Signed)
  Speech Language Pathology Treatment: Dysphagia  Patient Details Name: Kyle Garcia MRN: [id number] DOB: 1951-02-01 Today's Date: 07/07/2019 Time: 8185-6314 SLP Time Calculation (min) (ACUTE ONLY): 8 min  Assessment / Plan / Recommendation Clinical Impression  Moderate assist required to direct cup to mouth today but remembered chin tuck although needed tactile support for complete tuck. Significant left neglect however able to bring head to left past midline this session. Pt noted to cough prior to po consumption and RN reported coughing pre and post at breakfast/lunch. Unable to determine aspiration versus possible esophageal source without MBS. Recommend he continue honey thick liquids and Dys 2 with MBS tomorrow to fully evaluate swallow.    HPI HPI: 69yo M with history of R DVT on Xarelto, adenocarcinoma of the R lung, prior strokes on imaging, HLD, and GERD, who p/w left-sided hemiplegia and R gaze deviation. Found to have R MCA and ACA occlusions. Now s/p mechanical thrombectomy with VIR on 7/10. CXR 7/10: "Prominent right mid lung field infiltrate again noted without interim change."      SLP Plan  MBS       Recommendations  Diet recommendations: Dysphagia 2 (fine chop);Honey-thick liquid Liquids provided via: Cup Medication Administration: Crushed with puree Supervision: Full supervision/cueing for compensatory strategies;Patient able to self feed Compensations: Small sips/bites;Slow rate;Chin tuck(chin tuck with liquids) Postural Changes and/or Swallow Maneuvers: Seated upright 90 degrees                Oral Care Recommendations: Oral care BID Follow up Recommendations: Inpatient Rehab SLP Visit Diagnosis: Dysphagia, unspecified (R13.10) Plan: MBS       GO                Houston Siren 07/07/2019, 3:30 PM  Orbie Pyo Colvin Caroli.Ed Risk analyst [phone] Office [phone]

## 2019-07-07 NOTE — Progress Notes (Signed)
Physical Therapy Treatment Patient Details Name: Kyle Garcia MRN: [id number] DOB: 09-21-1951 Today's Date: 07/07/2019    History of Present Illness Patient is a 68 y/o male who presents with left sided weakness. Head CT-Positive CTA for emergent large vessel occlusion, with proximal right M2/M3 occlusion. Brain MRI- large right MCA/ACA infarct with smaller infarcts consistent with embolic phenomenon (93-90 infarcts seen). Now s/p mechanical thrombectomy on 7/10. Recent diagnosis of RLE DVT (6/27) on Xarelto, HLD and stage IV adenocarcinoma of the lung in May 2020.    PT Comments    Patient seen for mobility progression. Pt presents with L hemiplegia, L side inattention, and R gaze preference. Pt requires max A +2 this session for sitting balance EOB due to pt pushing with R UE. Continue to progress as tolerated.    Follow Up Recommendations  CIR;Supervision for mobility/OOB;Supervision/Assistance - 24 hour     Equipment Recommendations  Other (comment)(defer)    Recommendations for Other Services       Precautions / Restrictions Precautions Precautions: Fall Precaution Comments: pushing Restrictions Weight Bearing Restrictions: No    Mobility  Bed Mobility Overal bed mobility: Needs Assistance Bed Mobility: Rolling;Supine to Sit;Sit to Supine Rolling: Max assist;+2 for safety/equipment   Supine to sit: Max assist;+2 for physical assistance Sit to supine: Max assist;+2 for safety/equipment   General bed mobility comments: Hand over hand to facilitate reaching for bed rail, pt requiring increased time and cues due to decreased sustained attention to task and Lt inattention  Transfers Overall transfer level: Needs assistance   Transfers: Lateral/Scoot Transfers Sit to Stand: (partial stand to place new alarm pad under him)        Lateral/Scoot Transfers: +2 physical assistance;Max assist General transfer comment: unsafe to complete transfer OOB this session due to  increase in pushing, therefore engaged in lateral scoots x2 along EOB with partial sit > stand +2  Ambulation/Gait                 Stairs             Wheelchair Mobility    Modified Rankin (Stroke Patients Only) Modified Rankin (Stroke Patients Only) Pre-Morbid Rankin Score: No significant disability Modified Rankin: Severe disability     Balance Overall balance assessment: Needs assistance Sitting-balance support: Single extremity supported;Feet supported Sitting balance-Leahy Scale: Zero Sitting balance - Comments: While seated EOB, engaged in WB through Rt elbow to minimize pushing tendencies, however pt unable to maintain position often pushing back to Lt or leaning forward.  Required +2 for sitting balance due to strong pushing to Lt. Postural control: Left lateral lean(pushing to L with R UE) Standing balance support: During functional activity Standing balance-Leahy Scale: Zero                              Cognition Arousal/Alertness: Awake/alert Behavior During Therapy: Flat affect Overall Cognitive Status: Impaired/Different from baseline Area of Impairment: Safety/judgement;Following commands;Awareness;Problem solving;Attention                   Current Attention Level: Sustained   Following Commands: Follows one step commands with increased time;Follows one step commands inconsistently Safety/Judgement: Decreased awareness of deficits;Decreased awareness of safety Awareness: Emergent Problem Solving: Decreased initiation;Difficulty sequencing;Requires verbal cues;Requires tactile cues General Comments: A&Ox4. Pt with right gaze preference. Perseverating on something to drink      Exercises      General Comments  Pertinent Vitals/Pain Pain Assessment: Faces Faces Pain Scale: Hurts little more Pain Location: RLE Pain Descriptors / Indicators: Sore Pain Intervention(s): Limited activity within patient's  tolerance;Monitored during session;Repositioned    Home Living                      Prior Function            PT Goals (current goals can now be found in the care plan section) Acute Rehab PT Goals Patient Stated Goal: to go home Progress towards PT goals: Progressing toward goals    Frequency    Min 4X/week      PT Plan Current plan remains appropriate    Co-evaluation PT/OT/SLP Co-Evaluation/Treatment: Yes Reason for Co-Treatment: Complexity of the patient's impairments (multi-system involvement);For patient/therapist safety PT goals addressed during session: Mobility/safety with mobility;Balance        AM-PAC PT "6 Clicks" Mobility   Outcome Measure  Help needed turning from your back to your side while in a flat bed without using bedrails?: A Lot Help needed moving from lying on your back to sitting on the side of a flat bed without using bedrails?: A Lot Help needed moving to and from a bed to a chair (including a wheelchair)?: Total Help needed standing up from a chair using your arms (e.g., wheelchair or bedside chair)?: A Lot Help needed to walk in hospital room?: Total Help needed climbing 3-5 steps with a railing? : Total 6 Click Score: 9    End of Session Equipment Utilized During Treatment: Gait belt Activity Tolerance: Patient tolerated treatment well Patient left: with call bell/phone within reach;in bed;with bed alarm set Nurse Communication: Mobility status PT Visit Diagnosis: Hemiplegia and hemiparesis;Difficulty in walking, not elsewhere classified (R26.2) Hemiplegia - Right/Left: Left Hemiplegia - dominant/non-dominant: Non-dominant Hemiplegia - caused by: Cerebral infarction     Time: 1015-1055 PT Time Calculation (min) (ACUTE ONLY): 40 min  Charges:  $Therapeutic Activity: 8-22 mins                     Earney Navy, PTA Acute Rehabilitation Services Pager: [phone] Office: [phone]     Darliss Cheney 07/07/2019, 3:19 PM

## 2019-07-08 ENCOUNTER — Inpatient Hospital Stay: Payer: Medicare Other

## 2019-07-08 ENCOUNTER — Inpatient Hospital Stay (HOSPITAL_COMMUNITY): Payer: Medicare Other

## 2019-07-08 ENCOUNTER — Inpatient Hospital Stay: Payer: Medicare Other | Admitting: Internal Medicine

## 2019-07-08 DIAGNOSIS — J690 Pneumonitis due to inhalation of food and vomit: Secondary | ICD-10-CM

## 2019-07-08 DIAGNOSIS — E876 Hypokalemia: Secondary | ICD-10-CM

## 2019-07-08 DIAGNOSIS — I63411 Cerebral infarction due to embolism of right middle cerebral artery: Secondary | ICD-10-CM

## 2019-07-08 LAB — CBC
HCT: 25.3 % — ABNORMAL LOW (ref 39.0–52.0)
Hemoglobin: 8 g/dL — ABNORMAL LOW (ref 13.0–17.0)
MCH: 26.9 pg (ref 26.0–34.0)
MCHC: 31.6 g/dL (ref 30.0–36.0)
MCV: 85.2 fL (ref 80.0–100.0)
Platelets: 151 10*3/uL (ref 150–400)
RBC: 2.97 MIL/uL — ABNORMAL LOW (ref 4.22–5.81)
RDW: 16.1 % — ABNORMAL HIGH (ref 11.5–15.5)
WBC: 4.8 10*3/uL (ref 4.0–10.5)
nRBC: 0 % (ref 0.0–0.2)

## 2019-07-08 LAB — BASIC METABOLIC PANEL
Anion gap: 8 (ref 5–15)
BUN: 13 mg/dL (ref 8–23)
CO2: 23 mmol/L (ref 22–32)
Calcium: 7.7 mg/dL — ABNORMAL LOW (ref 8.9–10.3)
Chloride: 102 mmol/L (ref 98–111)
Creatinine, Ser: 0.77 mg/dL (ref 0.61–1.24)
GFR calc Af Amer: >60 mL/min (ref >60)
GFR calc non Af Amer: >60 mL/min (ref >60)
Glucose, Bld: 105 mg/dL — ABNORMAL HIGH (ref 70–99)
Potassium: 4.1 mmol/L (ref 3.5–5.1)
Sodium: 133 mmol/L — ABNORMAL LOW (ref 135–145)

## 2019-07-08 MED ORDER — LORAZEPAM 2 MG/ML IJ SOLN
1.0000 mg | Freq: Once | INTRAMUSCULAR | Status: AC
  Administered 2019-07-08: 1 mg via INTRAVENOUS
  Filled 2019-07-08: qty 1

## 2019-07-08 MED ORDER — KETOROLAC TROMETHAMINE 15 MG/ML IJ SOLN
15.0000 mg | Freq: Four times a day (QID) | INTRAMUSCULAR | Status: DC
  Administered 2019-07-08 – 2019-07-10 (×7): 15 mg via INTRAVENOUS
  Filled 2019-07-08 (×7): qty 1

## 2019-07-08 MED ORDER — LORAZEPAM BOLUS VIA INFUSION: 1.0000 mg | Freq: Once | INTRAVENOUS | Status: DC

## 2019-07-08 MED ORDER — SODIUM CHLORIDE 0.9 % IV SOLN
3.0000 g | Freq: Four times a day (QID) | INTRAVENOUS | Status: DC
  Administered 2019-07-08 – 2019-07-10 (×10): 3 g via INTRAVENOUS
  Filled 2019-07-08 (×2): qty 8
  Filled 2019-07-08: qty 3
  Filled 2019-07-08 (×2): qty 8
  Filled 2019-07-08 (×2): qty 3
  Filled 2019-07-08: qty 8
  Filled 2019-07-08 (×3): qty 3
  Filled 2019-07-08: qty 8

## 2019-07-08 NOTE — Progress Notes (Signed)
Inpatient Rehabilitation Admissions Coordinator  Noted Temp 101.8 max today. I await medial workup completion to be able to pursue an inpt rehab admit.  Danne Baxter, RN, MSN Rehab Admissions Coordinator [phone] 07/08/2019 1:04 PM

## 2019-07-08 NOTE — Progress Notes (Signed)
PT Cancellation Note  Patient Details Name: Kyle Garcia MRN: [id number] DOB: 04/09/1951   Cancelled Treatment:    Reason Eval/Treat Not Completed: Fatigue/lethargy limiting ability to participate PT held due to pt lethargy and fever. PT will continue to follow acutely.   Earney Navy, PTA Acute Rehabilitation Services Pager: [phone] Office: [phone]   07/08/2019, 2:27 PM

## 2019-07-08 NOTE — Progress Notes (Signed)
STROKE TEAM PROGRESS NOTE   INTERVAL HISTORY Pt still had temperature spike last night 101.8.  Still lethargic today, will put on Unasyn for empiric treatment.  Speech on board, patient has some coughing after barium study, likely due to aspiration of barium.  Patient still on dysphagia 2 and honey thick liquid.   Vitals:   07/08/19 0010 07/08/19 0352 07/08/19 0900 07/08/19 1204  BP: 110/68 (!) 111/52 109/68 111/68  Pulse: (!) 116 (!) 106 (!) 101 (!) 101  Resp: 18 17 16 20   Temp: (!) 101.8 F (38.8 C) 98.7 F (37.1 C) 98.6 F (37 C) (!) 100.7 F (38.2 C)  TempSrc: Oral Oral Oral Oral  SpO2: 97% 100% 99% 100%  Weight:      Height:        CBC:  Recent Labs  Lab 07/03/19 0205  07/04/19 0544  07/07/19 0432 07/08/19 0638  WBC 6.1  --  7.2   < > 5.0 4.8  NEUTROABS 4.3  --  6.0  --   --   --   HGB 9.6*   < > 7.7*   < > 7.5* 8.0*  HCT 30.5*   < > 24.6*   < > 23.5* 25.3*  MCV 83.8  --  86.6   < > 84.5 85.2  PLT 256  --  245   < > 194 151   < > = values in this interval not displayed.    Basic Metabolic Panel:  Recent Labs  Lab 07/07/19 0432 07/08/19 0638  NA 135 133*  K 3.2* 4.1  CL 104 102  CO2 24 23  GLUCOSE 110* 105*  BUN 13 13  CREATININE 0.70 0.77  CALCIUM 7.7* 7.7*   Lipid Panel:     Component Value Date/Time   CHOL 117 07/03/2019 0500   TRIG 93 07/03/2019 0500   HDL 29 (L) 07/03/2019 0500   CHOLHDL 4.0 07/03/2019 0500   VLDL 19 07/03/2019 0500   LDLCALC 69 07/03/2019 0500   LDLCALC 99 12/02/2018 0906   HgbA1c:  Lab Results  Component Value Date   HGBA1C 5.7 (H) 07/03/2019   Urine Drug Screen:     Component Value Date/Time   LABOPIA POSITIVE (A) 07/03/2019 0258   COCAINSCRNUR NONE DETECTED 07/03/2019 0258   LABBENZ NONE DETECTED 07/03/2019 0258   AMPHETMU NONE DETECTED 07/03/2019 0258   THCU NONE DETECTED 07/03/2019 0258   LABBARB NONE DETECTED 07/03/2019 0258    Alcohol Level     Component Value Date/Time   ETH <10 07/03/2019 0205     IMAGING  Dg Swallowing Func-speech Pathology  07/05/2019 DIET RECOMMENDATION 07/05/2019 SLP Diet Recommendations Dysphagia 3 (Mech soft) solids;Thin liquid Liquid Administration via Cup;No straw Medication Administration Whole meds with puree Compensations Slow rate;Small sips/bites;Chin tuck Postural Changes Seated upright at 90 degrees   CHL IP OTHER RECOMMENDATIONS 07/05/2019 Recommended Consults -- Oral Care Recommendations Oral care BID   Cerebral angiogram S/O RT common carotid arteriogram followed complete revascularization of occluded MCA dominant mid division with x2 passes with 59mm x 51mm embotrap and x 3 passes with solitaire x 4 x 40 mm retriever device achieving a TICI 3 revascularization. Also partial revascularization of occluded ACA A2 A3 region with x 1 pass with solitaire X 1mmx 40 mm device.  2D Echocardiogram  06/30/2019  1. The left ventricle has normal systolic function with an ejection fraction of 60-65%. The cavity size was normal. Left ventricular diastolic Doppler parameters are consistent with impaired relaxation. No evidence  of left ventricular regional wall  motion abnormalities.  2. The right ventricle has normal systolic function. The cavity was normal. There is no increase in right ventricular wall thickness.  3. Trivial pericardial effusion is present.  4. No evidence of mitral valve stenosis. Mild mitral regurgitation.  5. The aortic valve is tricuspid. No stenosis of the aortic valve.  6. Normal IVC size. No complete TR doppler jet so unable to estimate PA systolic pressure.  Chest x-ray  07/07/2019  Removal of ET tube. Persistent infiltrate in the right lung. Recommend follow-up to resolution.   PHYSICAL EXAM   Temp:  [98.4 F (36.9 C)-101.8 F (38.8 C)] 100.7 F (38.2 C) (07/15 1204) Pulse Rate:  [101-119] 101 (07/15 1204) Resp:  [16-20] 20 (07/15 1204) BP: (109-128)/(52-68) 111/68 (07/15 1204) SpO2:  [95 %-100 %] 100 % (07/15 1204)  General -  Well nourished, well developed, not in acute distress, lethargic.  Ophthalmologic - fundi not visualized due to noncooperation.  Cardiovascular - Regular rhythm, but tachycardia.  Mental Status -  Orientation to time, place, and person were intact. Language including expression, naming, repetition, comprehension was assessed and found intact, however mild dysarthria.  Cranial Nerves II - XII - II - left lower quadrantanopia. III, IV, VI - Extraocular movements intact, no gaze deviation. V - Facial sensation intact bilaterally. VII - left facial droop. VIII - Hearing & vestibular intact bilaterally. X - Palate elevates not able to see well, but mild dysarthria. XI - Chin turning & shoulder shrug intact bilaterally. XII - Tongue protrusion intact.  Motor Strength - The patient's strength was 4/5 RUE and 3/5 RLE but dense hemiplegia on the left UE and LE.  Bulk was normal and fasciculations were absent.   Motor Tone - Muscle tone was assessed at the neck and appendages and was decreased on the left.  Reflexes - The patient's reflexes were symmetrical in all extremities and he had triple reflex on the left.  Sensory - Light touch, temperature/pinprick were assessed and were symmetrical.    Coordination - The patient had normal movements in the right hand with no ataxia or dysmetria.  Tremor was absent.  Gait and Station - deferred.   ASSESSMENT/PLAN [name] is a 68 y.o. male with history of DVT on Xarelto, adenocarcinoma of the right lung, hyperlipidemia, prior strokes noted on MRI presenting with L sided weakness and R gaze. No tPA d/t xarelto.   Stroke:   Embolic shower right MCA occlusion s/p IR with TICI3 reperfusion- infarct embolic secondary to hypercoagulable state from lung cancer even on Xarelto for DVT. Marantic endocarditis due to advanced malignancy cannot be ruled out.  Code Stroke CT head No acute abnormality.     CTA head & neck ELVO prox R M2/M3  occlusion  CT perfusion Mod-sized penumbra  Cerebral angio R MCA occlusion, TICI3 revascularization; partial revascularization occluded ACA A2 A3  Post IR CT slight R SAH in R perisylvian fissure and anterior interhemispheric fissure. No gross mass effect.  MRI  Multiple infarcts R parietal and R cingulate gyrus. Numerous punctate L>R parietal due to small emboli  2D Echo EF 60-65%. No source of embolus   TCD w/ bubble negative for PFO  Will not pursue TEE to rule out marantic endocarditis given no change of therapy  LDL - 69  HgbA1c - 5.7  Eliquis for VTE prophylaxis  Xarelto (rivaroxaban) daily prior to admission, now on Eliquis 5mg  bid today  Therapy recommendations:  CIR  Disposition:  pending - transfer to rehab on hold given fever  History of recent stroke also likely related to hypercoagulable state  MRI on 06/10/2019 showed multiple embolic cortical and subcortical bilateral infarcts.    2D echo at that time was unremarkable.   Continued on Xarelto -> now switch to eliquis  Dysphagia . Secondary to stroke . Passed barium study  . had cough with food intermittently. SLP reassessed, now on dysphagia 2 diet with honey thick liquid . Added Ensure Enlive . Speech on board  Fever with aspiration pneumonia  TM 100.5->100.5->101.8->100.7  WBC 6.3->5.0->4.8  CXR persistent right lung infiltrate  UA neg  Gentle IV hydration @ 40  Started on Unasyn 3gm q6h 7/15   R leg DVT/SVT  Confirmed by doppler 04/15/2019 and 06/20/2019  On xarelto PTA  Treated with heparin IV in hospital, tolerating well  Now on Eliquis 5 bid  Stage 4 adenocarcinoma R lung  Hold off chemo now  Follow with Dr. Julien Nordmann  Hyperlipidemia  Home meds:  pravachol 20  LDL 69, goal < 70  Now on Crestor 20 mg daily  Continue statin at discharge   Other Stroke Risk Factors  Advanced age  Former Cigarette smoker, quit 4 mos ago, advised to continue cessation of smoking  ETOH  use, advised to drink no more than 2 drink(s) a day  Other Active Problems  GERD  Anemia due to chronic disease- Hb 9.9-7.7-7.4-7.8-7.5-8.0  Hypokalemia - 3.1 -> 3.4 ->3.2 -> 4.1  Hospital day # 5   Rosalin Hawking, MD PhD Stroke Neurology 07/08/2019 2:58 PM   To contact Stroke Continuity provider, please refer to [email or web address] After hours, contact General Neurology

## 2019-07-08 NOTE — Progress Notes (Signed)
Modified Barium Swallow Progress Note  Patient Details  Name: Kyle Garcia MRN: [id number] Date of Birth: May 05, 1951  Today's Date: 07/08/2019  Modified Barium Swallow completed.  Full report located under Chart Review in the Imaging Section.  Brief recommendations include the following:  Clinical Impression  Pt's repeat MBS today was similar to prior on 7/12 with possibly slight improvements in motor strength. He continues to aspirate thin liquids and penetrate nectar with head in neutral. Today laryngeal elevation and closure appeared adequate but timing and coordination and closure was suboptimal. Thin and nectar (with straw) boluses entered laryngeal vestibule prior to complete epiglottic deflection in addition to large volume with nectar. Verbal cues for smaller sips using straw were safe. He was hypersensitive to even small amount of penetration and produced strong reflexive cough to clear penetrates. There was mild-mod amounts of vallecular and pyriform sinus residue. Oral phase marked decreased cohesion with anterior spill and lingual pumping. Positioning tricky due to kyphosis/cervical rigidity and head in mostly static flexed and right rotation. He was mildy more lethargic today and has been febrile for 3 days, therefore will continue honey thick liquids/Dys 2 texture and ST intervention for diet modification (likely return to nectar with clinical observation only) and trial of RMT (respiratory muscle strength training).      Swallow Evaluation Recommendations       SLP Diet Recommendations: Honey thick liquids;Dysphagia 2 (Fine chop) solids   Liquid Administration via: Cup;No straw   Medication Administration: Crushed with puree   Supervision: Full supervision/cueing for compensatory strategies;Staff to assist with self feeding   Compensations: Small sips/bites;Slow rate   Postural Changes: Seated upright at 90 degrees   Oral Care Recommendations: Oral care BID         Houston Siren 07/08/2019,3:16 PM  Orbie Pyo Novice.Ed Risk analyst [phone] Office [phone]

## 2019-07-08 NOTE — Progress Notes (Signed)
Called to assess patient for right neck pain. The pain has been ongoing since this AM but has worsened. The pain is exacerbated by attempts to tilt his head back to the midline.   BP 115/76 (BP Location: Left Arm)   Pulse 98   Temp 98.2 F (36.8 C) (Oral)   Resp 17   Ht 5\' 11"  (1.803 m)   Wt 67.3 kg   SpO2 100%   BMI 20.69 kg/m   Exam: Speech intact. Patient with head flexed laterally to the left, with complaint of pain to right side of neck. Palpation reveals tenderness to right parietal skull, right lateral neck, right posterior neck, right trapezius and right paraspinal muscles of upper back. The pain is decreased after light massage to a more tolerable level, per patient. Repositioning of head so that it is less acutely deviated to the left is also helping with the pain.   A/R: Muscle spasm involving right side of neck and posterior paraspinal muscles on the right.  1. Ativan 1 mg IV x 1, followed by IV Toradol scheduled dosing 2. Embolic shower right MCA occlusion s/p IR with TICI3 reperfusion- infarct embolic secondary to hypercoagulable state from lung cancer even on Xarelto for DVT.  3. Continue to monitor.   Electronically signed: Dr. Kerney Elbe

## 2019-07-09 ENCOUNTER — Inpatient Hospital Stay (HOSPITAL_COMMUNITY): Payer: Medicare Other

## 2019-07-09 LAB — CBC
HCT: 25 % — ABNORMAL LOW (ref 39.0–52.0)
Hemoglobin: 7.8 g/dL — ABNORMAL LOW (ref 13.0–17.0)
MCH: 27 pg (ref 26.0–34.0)
MCHC: 31.2 g/dL (ref 30.0–36.0)
MCV: 86.5 fL (ref 80.0–100.0)
Platelets: 138 10*3/uL — ABNORMAL LOW (ref 150–400)
RBC: 2.89 MIL/uL — ABNORMAL LOW (ref 4.22–5.81)
RDW: 15.9 % — ABNORMAL HIGH (ref 11.5–15.5)
WBC: 4.9 10*3/uL (ref 4.0–10.5)
nRBC: 0 % (ref 0.0–0.2)

## 2019-07-09 LAB — BASIC METABOLIC PANEL
Anion gap: 8 (ref 5–15)
BUN: 16 mg/dL (ref 8–23)
CO2: 22 mmol/L (ref 22–32)
Calcium: 7.7 mg/dL — ABNORMAL LOW (ref 8.9–10.3)
Chloride: 104 mmol/L (ref 98–111)
Creatinine, Ser: 0.85 mg/dL (ref 0.61–1.24)
GFR calc Af Amer: >60 mL/min (ref >60)
GFR calc non Af Amer: >60 mL/min (ref >60)
Glucose, Bld: 117 mg/dL — ABNORMAL HIGH (ref 70–99)
Potassium: 3.8 mmol/L (ref 3.5–5.1)
Sodium: 134 mmol/L — ABNORMAL LOW (ref 135–145)

## 2019-07-09 NOTE — Progress Notes (Signed)
STROKE TEAM PROGRESS NOTE   INTERVAL HISTORY Patient lying in bed, still lethargic, has overnight fever 102.1.  No fever this morning.  On Unasyn IV.  Patient able to open eyes and answer questions.  Still has left hemiplegia.   Vitals:   07/09/19 0021 07/09/19 0334 07/09/19 0756 07/09/19 1230  BP: (!) 113/58 112/66 106/67 118/67  Pulse: (!) 104 98 97 100  Resp: 18 18 18 19   Temp: (!) 102.1 F (38.9 C) 98.7 F (37.1 C) 98.1 F (36.7 C) 99.3 F (37.4 C)  TempSrc: Axillary Oral Oral Oral  SpO2: 96% 97% 96% 99%  Weight:      Height:        CBC:  Recent Labs  Lab 07/03/19 0205  07/04/19 0544  07/08/19 0638 07/09/19 0438  WBC 6.1  --  7.2   < > 4.8 4.9  NEUTROABS 4.3  --  6.0  --   --   --   HGB 9.6*   < > 7.7*   < > 8.0* 7.8*  HCT 30.5*   < > 24.6*   < > 25.3* 25.0*  MCV 83.8  --  86.6   < > 85.2 86.5  PLT 256  --  245   < > 151 138*   < > = values in this interval not displayed.    Basic Metabolic Panel:  Recent Labs  Lab 07/08/19 0638 07/09/19 0438  NA 133* 134*  K 4.1 3.8  CL 102 104  CO2 23 22  GLUCOSE 105* 117*  BUN 13 16  CREATININE 0.77 0.85  CALCIUM 7.7* 7.7*   Lipid Panel:     Component Value Date/Time   CHOL 117 07/03/2019 0500   TRIG 93 07/03/2019 0500   HDL 29 (L) 07/03/2019 0500   CHOLHDL 4.0 07/03/2019 0500   VLDL 19 07/03/2019 0500   LDLCALC 69 07/03/2019 0500   LDLCALC 99 12/02/2018 0906   HgbA1c:  Lab Results  Component Value Date   HGBA1C 5.7 (H) 07/03/2019   Urine Drug Screen:     Component Value Date/Time   LABOPIA POSITIVE (A) 07/03/2019 0258   COCAINSCRNUR NONE DETECTED 07/03/2019 0258   LABBENZ NONE DETECTED 07/03/2019 0258   AMPHETMU NONE DETECTED 07/03/2019 0258   THCU NONE DETECTED 07/03/2019 0258   LABBARB NONE DETECTED 07/03/2019 0258    Alcohol Level     Component Value Date/Time   ETH <10 07/03/2019 0205    IMAGING  Dg Swallowing Func-speech Pathology  07/05/2019 DIET RECOMMENDATION 07/05/2019 SLP Diet  Recommendations Dysphagia 3 (Mech soft) solids;Thin liquid Liquid Administration via Cup;No straw Medication Administration Whole meds with puree Compensations Slow rate;Small sips/bites;Chin tuck Postural Changes Seated upright at 90 degrees   CHL IP OTHER RECOMMENDATIONS 07/05/2019 Recommended Consults -- Oral Care Recommendations Oral care BID   Cerebral angiogram S/O RT common carotid arteriogram followed complete revascularization of occluded MCA dominant mid division with x2 passes with 42mm x 52mm embotrap and x 3 passes with solitaire x 4 x 40 mm retriever device achieving a TICI 3 revascularization. Also partial revascularization of occluded ACA A2 A3 region with x 1 pass with solitaire X 15mmx 40 mm device.  2D Echocardiogram  06/30/2019  1. The left ventricle has normal systolic function with an ejection fraction of 60-65%. The cavity size was normal. Left ventricular diastolic Doppler parameters are consistent with impaired relaxation. No evidence of left ventricular regional wall  motion abnormalities.  2. The right ventricle has normal systolic function.  The cavity was normal. There is no increase in right ventricular wall thickness.  3. Trivial pericardial effusion is present.  4. No evidence of mitral valve stenosis. Mild mitral regurgitation.  5. The aortic valve is tricuspid. No stenosis of the aortic valve.  6. Normal IVC size. No complete TR doppler jet so unable to estimate PA systolic pressure.  Chest x-ray  07/07/2019  Removal of ET tube. Persistent infiltrate in the right lung. Recommend follow-up to resolution.   PHYSICAL EXAM   Temp:  [98.1 F (36.7 C)-102.1 F (38.9 C)] 99.3 F (37.4 C) (07/16 1230) Pulse Rate:  [97-110] 100 (07/16 1230) Resp:  [17-19] 19 (07/16 1230) BP: (106-118)/(58-76) 118/67 (07/16 1230) SpO2:  [96 %-100 %] 99 % (07/16 1230)  General - Well nourished, well developed, not in acute distress, lethargic.  Ophthalmologic - fundi not visualized  due to noncooperation.  Cardiovascular - Regular rhythm, but mild tachycardia.  Mental Status -  Lethargic, able to open eyes on request, orientation to time, place, and person were intact. Language including expression, naming, repetition, comprehension was assessed and found intact, however mild dysarthria.  Cranial Nerves II - XII - II - left lower quadrantanopia. III, IV, VI - Extraocular movements intact, no gaze deviation. V - Facial sensation intact bilaterally. VII - left facial droop. VIII - Hearing & vestibular intact bilaterally. X - Palate elevates not able to see well, but mild dysarthria. XI - Chin turning & shoulder shrug intact bilaterally. XII - Tongue protrusion intact.  Motor Strength - The patient's strength was 4/5 RUE and 3/5 RLE but hemiplegia on the left.  Bulk was normal and fasciculations were absent.   Motor Tone - Muscle tone was assessed at the neck and appendages and was decreased on the left.  Reflexes - The patient's reflexes were symmetrical in all extremities and he had triple reflex on the left.  Sensory - Light touch, temperature/pinprick were assessed and were symmetrical.    Coordination - The patient had normal movements in the right hand with no ataxia or dysmetria.  Tremor was absent.  Gait and Station - deferred.   ASSESSMENT/PLAN [name] is a 68 y.o. male with history of DVT on Xarelto, adenocarcinoma of the right lung, hyperlipidemia, prior strokes noted on MRI presenting with L sided weakness and R gaze. No tPA d/t xarelto.   Stroke:   Embolic shower right MCA occlusion s/p IR with TICI3 reperfusion- infarct embolic secondary to hypercoagulable state from lung cancer even on Xarelto for DVT. Marantic endocarditis due to advanced malignancy cannot be ruled out.  Code Stroke CT head No acute abnormality.     CTA head & neck ELVO prox R M2/M3 occlusion  CT perfusion Mod-sized penumbra  Cerebral angio R MCA occlusion, TICI3  revascularization; partial revascularization occluded ACA A2 A3  Post IR CT slight R SAH in R perisylvian fissure and anterior interhemispheric fissure. No gross mass effect.  MRI  Multiple infarcts R parietal and R cingulate gyrus. Numerous punctate L>R parietal due to small emboli  2D Echo EF 60-65%. No source of embolus   TCD w/ bubble negative for PFO  Will not pursue TEE to rule out marantic endocarditis given no change of therapy  LDL - 69  HgbA1c - 5.7  Eliquis for VTE prophylaxis  Xarelto (rivaroxaban) daily prior to admission, now on Eliquis 5mg  bid today  Therapy recommendations:  CIR  Disposition:  pending - transfer to rehab on hold given fever  History of  recent stroke also likely related to hypercoagulable state  MRI on 06/10/2019 showed multiple embolic cortical and subcortical bilateral infarcts.    2D echo at that time was unremarkable.   Continued on Xarelto -> now switch to eliquis  Dysphagia . Secondary to stroke . Passed barium study  . had cough with food intermittently. SLP reassessed, now on dysphagia 2 diet with honey thick liquid . Added Ensure Enlive . Speech on board  Fever with aspiration pneumonia  TM 100.5->100.5->101.8->100.7->102.1  WBC 6.3->5.0->4.8->4.9  CXR stable R mid lung PNA x 2 days, improved since 6 days ago.  UA neg  Gentle IV hydration @ 40  Continue Unasyn 3gm q6h 7/15   blood cultures pending  R leg DVT/SVT  Confirmed by doppler 04/15/2019 and 06/20/2019  On xarelto PTA  Treated with heparin IV in hospital, tolerating well  Now on Eliquis 5 bid  Stage 4 adenocarcinoma R lung  Hold off chemo now  Follow with Dr. Julien Nordmann  Hyperlipidemia  Home meds:  pravachol 20  LDL 69, goal < 70  Now on Crestor 20 mg daily  Continue statin at discharge   Other Stroke Risk Factors  Advanced age  Former Cigarette smoker, quit 4 mos ago, advised to continue cessation of smoking  ETOH use, advised to drink no  more than 2 drink(s) a day  Other Active Problems  GERD  Anemia due to chronic disease- Hb 9.9-7.7-7.4-7.8-7.5-8.0-7.8  Hypokalemia - 3.1 -> 3.4 ->3.2 -> 4.1->3.8  Neck pain, muscular treated w/ ativan-> scheduled toradol dosing   Hospital day # 6  Rosalin Hawking, MD PhD Stroke Neurology 07/09/2019 7:05 PM   To contact Stroke Continuity provider, please refer to [email or web address] After hours, contact General Neurology

## 2019-07-09 NOTE — Progress Notes (Signed)
Plan is for CIR once patient is medically stable. TOC following.

## 2019-07-09 NOTE — Progress Notes (Signed)
Physical Therapy Treatment Patient Details Name: Kyle Garcia MRN: [id number] DOB: July 01, 1951 Today's Date: 07/09/2019    History of Present Illness Patient is a 68 y/o male who presents with left sided weakness. Head CT-Positive CTA for emergent large vessel occlusion, with proximal right M2/M3 occlusion. Brain MRI- large right MCA/ACA infarct with smaller infarcts consistent with embolic phenomenon (02-54 infarcts seen). Now s/p mechanical thrombectomy on 7/10. Recent diagnosis of RLE DVT (6/27) on Xarelto, HLD and stage IV adenocarcinoma of the lung in May 2020.    PT Comments    Patient seen for mobility progression. This session focused on sitting balance EOB and unable to progress to transfer training due to pt pushing heavily to L side.  Continue to progress as tolerated.   Follow Up Recommendations  CIR;Supervision for mobility/OOB;Supervision/Assistance - 24 hour     Equipment Recommendations  Other (comment)(defer)    Recommendations for Other Services       Precautions / Restrictions Precautions Precautions: Fall Precaution Comments: pushes hard to L  Restrictions Weight Bearing Restrictions: No    Mobility  Bed Mobility Overal bed mobility: Needs Assistance Bed Mobility: Rolling;Sidelying to Sit;Sit to Sidelying Rolling: Max assist;+2 for safety/equipment Sidelying to sit: Max assist;+2 for physical assistance     Sit to sidelying: Total assist;+2 for physical assistance General bed mobility comments: vc for sequencing, multimodal cues and assist for R knee flexion, and hand over hand assist for reaching to L bed rail; assist at hips and trunk to come into sitting   Transfers                    Ambulation/Gait                 Stairs             Wheelchair Mobility    Modified Rankin (Stroke Patients Only) Modified Rankin (Stroke Patients Only) Pre-Morbid Rankin Score: No significant disability Modified Rankin: Severe  disability     Balance Overall balance assessment: Needs assistance Sitting-balance support: Single extremity supported;Feet supported Sitting balance-Leahy Scale: Poor(zero to poor) Sitting balance - Comments: max A required to maintain sitting balance EOB with positioning of R UE to decrease pushing; worked on midline posture with therapist seated beside pt and briefly pt able to maintain sitting balance with mod A  Postural control: Left lateral lean(pushing to L with R UE)                                  Cognition Arousal/Alertness: Awake/alert Behavior During Therapy: Flat affect Overall Cognitive Status: Impaired/Different from baseline Area of Impairment: Safety/judgement;Following commands;Awareness;Problem solving                       Following Commands: Follows one step commands with increased time;Follows one step commands inconsistently Safety/Judgement: Decreased awareness of deficits;Decreased awareness of safety   Problem Solving: Decreased initiation;Difficulty sequencing;Requires verbal cues;Requires tactile cues General Comments: pt fixated on getting "something to drink"      Exercises      General Comments        Pertinent Vitals/Pain Pain Assessment: Faces Faces Pain Scale: Hurts little more Pain Location: RLE and sometimes with bed mobility--unspecified Pain Descriptors / Indicators: Sore Pain Intervention(s): Repositioned;Monitored during session    Home Living  Prior Function            PT Goals (current goals can now be found in the care plan section) Acute Rehab PT Goals Patient Stated Goal: to go home Progress towards PT goals: Progressing toward goals    Frequency    Min 4X/week      PT Plan Current plan remains appropriate    Co-evaluation              AM-PAC PT "6 Clicks" Mobility   Outcome Measure  Help needed turning from your back to your side while in a flat  bed without using bedrails?: A Lot Help needed moving from lying on your back to sitting on the side of a flat bed without using bedrails?: A Lot Help needed moving to and from a bed to a chair (including a wheelchair)?: Total Help needed standing up from a chair using your arms (e.g., wheelchair or bedside chair)?: A Lot Help needed to walk in hospital room?: Total Help needed climbing 3-5 steps with a railing? : Total 6 Click Score: 9    End of Session Equipment Utilized During Treatment: Gait belt Activity Tolerance: Patient tolerated treatment well Patient left: with call bell/phone within reach;in bed;with bed alarm set Nurse Communication: Mobility status PT Visit Diagnosis: Hemiplegia and hemiparesis;Difficulty in walking, not elsewhere classified (R26.2) Hemiplegia - Right/Left: Left Hemiplegia - dominant/non-dominant: Non-dominant Hemiplegia - caused by: Cerebral infarction     Time: 0938-1829 PT Time Calculation (min) (ACUTE ONLY): 34 min  Charges:  $Therapeutic Activity: 8-22 mins $Neuromuscular Re-education: 8-22 mins                     Earney Navy, PTA Acute Rehabilitation Services Pager: [phone] Office: [phone]     Darliss Cheney 07/09/2019, 3:48 PM

## 2019-07-09 NOTE — Consult Note (Signed)
   [facility] CM Inpatient Consult   07/09/2019  Kyle Garcia [birth year] [id number]    Patient'schart has been reviewed for possible need of [facility] care management services as benefit under his  Medicare/ [employer] plan with a 30 day readmission, 2 hospitalizations and 1 ED visit in past 6 months; he has medium risk score of16%for unplanned readmissions.   Review of patient's medical record an MD note dated 7/12/20show asfollows: [name] is a 68 y.o. male with history of DVT on Xarelto, adenocarcinoma of the right lung, hyperlipidemia, prior strokes noted on MRI presenting with L sided weakness and R gaze. No tPA d/t xarelto.Found to have R MCA and ACA occlusions. Now s/p mechanical thrombectomy with VIR on 7/10.  His primary care provider is Dr. Jenna Luo with [facility], listed to provide transition of care.  Per PT/ OT notes reviewed, currently recommending inpatientlevel rehab therapies (CIR- [facility]) to maximize independence, mobility and ease burden of care prior to return home. Transition of care CM note states that discharge plan is for CIR once patient is medically stable.   Will followalong disposition and if there are any changes in dispositionand needs for appropriate community follow-up, please referto [facility] care management.  Of note, [facility] Care Management services does not replace or interfere with any services arranged by transition of care CM or social work.   For questions and additional information, please call:  Victor Granados A. Nicle Connole, BSN, RN-BC [facility] Liaison Cell: [phone]

## 2019-07-10 ENCOUNTER — Encounter (HOSPITAL_COMMUNITY): Payer: Self-pay | Admitting: *Deleted

## 2019-07-10 ENCOUNTER — Inpatient Hospital Stay (HOSPITAL_COMMUNITY)
Admission: RE | Admit: 2019-07-10 | Discharge: 2019-08-14 | DRG: 056 | Disposition: A | Discharge status: Hospice | Payer: Medicare Other | Source: Intra-hospital | Attending: Physical Medicine & Rehabilitation | Admitting: Physical Medicine & Rehabilitation

## 2019-07-10 ENCOUNTER — Other Ambulatory Visit: Payer: Self-pay

## 2019-07-10 DIAGNOSIS — R410 Disorientation, unspecified: Secondary | ICD-10-CM | POA: Diagnosis not present

## 2019-07-10 DIAGNOSIS — I69354 Hemiplegia and hemiparesis following cerebral infarction affecting left non-dominant side: Principal | ICD-10-CM

## 2019-07-10 DIAGNOSIS — Z515 Encounter for palliative care: Secondary | ICD-10-CM | POA: Diagnosis not present

## 2019-07-10 DIAGNOSIS — J690 Pneumonitis due to inhalation of food and vomit: Secondary | ICD-10-CM | POA: Diagnosis present

## 2019-07-10 DIAGNOSIS — R05 Cough: Secondary | ICD-10-CM | POA: Diagnosis not present

## 2019-07-10 DIAGNOSIS — R4189 Other symptoms and signs involving cognitive functions and awareness: Secondary | ICD-10-CM | POA: Diagnosis present

## 2019-07-10 DIAGNOSIS — T17908A Unspecified foreign body in respiratory tract, part unspecified causing other injury, initial encounter: Secondary | ICD-10-CM | POA: Diagnosis not present

## 2019-07-10 DIAGNOSIS — Z66 Do not resuscitate: Secondary | ICD-10-CM | POA: Diagnosis not present

## 2019-07-10 DIAGNOSIS — E785 Hyperlipidemia, unspecified: Secondary | ICD-10-CM | POA: Diagnosis present

## 2019-07-10 DIAGNOSIS — D6859 Other primary thrombophilia: Secondary | ICD-10-CM | POA: Diagnosis not present

## 2019-07-10 DIAGNOSIS — G8114 Spastic hemiplegia affecting left nondominant side: Secondary | ICD-10-CM | POA: Diagnosis present

## 2019-07-10 DIAGNOSIS — K219 Gastro-esophageal reflux disease without esophagitis: Secondary | ICD-10-CM | POA: Diagnosis present

## 2019-07-10 DIAGNOSIS — G8194 Hemiplegia, unspecified affecting left nondominant side: Secondary | ICD-10-CM | POA: Diagnosis not present

## 2019-07-10 DIAGNOSIS — M545 Low back pain: Secondary | ICD-10-CM | POA: Diagnosis present

## 2019-07-10 DIAGNOSIS — Z7401 Bed confinement status: Secondary | ICD-10-CM | POA: Diagnosis not present

## 2019-07-10 DIAGNOSIS — R591 Generalized enlarged lymph nodes: Secondary | ICD-10-CM | POA: Diagnosis present

## 2019-07-10 DIAGNOSIS — R414 Neurologic neglect syndrome: Secondary | ICD-10-CM | POA: Diagnosis present

## 2019-07-10 DIAGNOSIS — Z87891 Personal history of nicotine dependence: Secondary | ICD-10-CM | POA: Diagnosis not present

## 2019-07-10 DIAGNOSIS — E861 Hypovolemia: Secondary | ICD-10-CM | POA: Diagnosis not present

## 2019-07-10 DIAGNOSIS — R1312 Dysphagia, oropharyngeal phase: Secondary | ICD-10-CM | POA: Diagnosis present

## 2019-07-10 DIAGNOSIS — Z681 Body mass index (BMI) 19 or less, adult: Secondary | ICD-10-CM | POA: Diagnosis not present

## 2019-07-10 DIAGNOSIS — I63411 Cerebral infarction due to embolism of right middle cerebral artery: Secondary | ICD-10-CM

## 2019-07-10 DIAGNOSIS — R4182 Altered mental status, unspecified: Secondary | ICD-10-CM | POA: Diagnosis not present

## 2019-07-10 DIAGNOSIS — D649 Anemia, unspecified: Secondary | ICD-10-CM | POA: Diagnosis present

## 2019-07-10 DIAGNOSIS — Z8673 Personal history of transient ischemic attack (TIA), and cerebral infarction without residual deficits: Secondary | ICD-10-CM

## 2019-07-10 DIAGNOSIS — I69391 Dysphagia following cerebral infarction: Secondary | ICD-10-CM

## 2019-07-10 DIAGNOSIS — C3491 Malignant neoplasm of unspecified part of right bronchus or lung: Secondary | ICD-10-CM | POA: Diagnosis present

## 2019-07-10 DIAGNOSIS — Z9221 Personal history of antineoplastic chemotherapy: Secondary | ICD-10-CM | POA: Diagnosis not present

## 2019-07-10 DIAGNOSIS — I959 Hypotension, unspecified: Secondary | ICD-10-CM | POA: Diagnosis present

## 2019-07-10 DIAGNOSIS — D630 Anemia in neoplastic disease: Secondary | ICD-10-CM | POA: Diagnosis not present

## 2019-07-10 DIAGNOSIS — G8110 Spastic hemiplegia affecting unspecified side: Secondary | ICD-10-CM | POA: Diagnosis not present

## 2019-07-10 DIAGNOSIS — E46 Unspecified protein-calorie malnutrition: Secondary | ICD-10-CM | POA: Diagnosis not present

## 2019-07-10 DIAGNOSIS — R918 Other nonspecific abnormal finding of lung field: Secondary | ICD-10-CM | POA: Diagnosis not present

## 2019-07-10 DIAGNOSIS — I63511 Cerebral infarction due to unspecified occlusion or stenosis of right middle cerebral artery: Secondary | ICD-10-CM | POA: Diagnosis present

## 2019-07-10 DIAGNOSIS — Z86718 Personal history of other venous thrombosis and embolism: Secondary | ICD-10-CM | POA: Diagnosis not present

## 2019-07-10 DIAGNOSIS — I9589 Other hypotension: Secondary | ICD-10-CM | POA: Diagnosis not present

## 2019-07-10 DIAGNOSIS — R000 Tachycardia, unspecified: Secondary | ICD-10-CM | POA: Diagnosis present

## 2019-07-10 DIAGNOSIS — I69318 Other symptoms and signs involving cognitive functions following cerebral infarction: Secondary | ICD-10-CM | POA: Diagnosis not present

## 2019-07-10 DIAGNOSIS — R509 Fever, unspecified: Secondary | ICD-10-CM

## 2019-07-10 DIAGNOSIS — R059 Cough, unspecified: Secondary | ICD-10-CM

## 2019-07-10 DIAGNOSIS — M2550 Pain in unspecified joint: Secondary | ICD-10-CM | POA: Diagnosis not present

## 2019-07-10 DIAGNOSIS — C799 Secondary malignant neoplasm of unspecified site: Secondary | ICD-10-CM | POA: Diagnosis present

## 2019-07-10 DIAGNOSIS — R6510 Systemic inflammatory response syndrome (SIRS) of non-infectious origin without acute organ dysfunction: Secondary | ICD-10-CM

## 2019-07-10 DIAGNOSIS — C3492 Malignant neoplasm of unspecified part of left bronchus or lung: Secondary | ICD-10-CM | POA: Diagnosis not present

## 2019-07-10 LAB — BASIC METABOLIC PANEL
Anion gap: 8 (ref 5–15)
BUN: 19 mg/dL (ref 8–23)
CO2: 20 mmol/L — ABNORMAL LOW (ref 22–32)
Calcium: 7.5 mg/dL — ABNORMAL LOW (ref 8.9–10.3)
Chloride: 104 mmol/L (ref 98–111)
Creatinine, Ser: 0.77 mg/dL (ref 0.61–1.24)
GFR calc Af Amer: >60 mL/min (ref >60)
GFR calc non Af Amer: >60 mL/min (ref >60)
Glucose, Bld: 101 mg/dL — ABNORMAL HIGH (ref 70–99)
Potassium: 4.3 mmol/L (ref 3.5–5.1)
Sodium: 132 mmol/L — ABNORMAL LOW (ref 135–145)

## 2019-07-10 LAB — CBC
HCT: 26 % — ABNORMAL LOW (ref 39.0–52.0)
Hemoglobin: 8.3 g/dL — ABNORMAL LOW (ref 13.0–17.0)
MCH: 27.3 pg (ref 26.0–34.0)
MCHC: 31.9 g/dL (ref 30.0–36.0)
MCV: 85.5 fL (ref 80.0–100.0)
Platelets: 151 10*3/uL (ref 150–400)
RBC: 3.04 MIL/uL — ABNORMAL LOW (ref 4.22–5.81)
RDW: 15.9 % — ABNORMAL HIGH (ref 11.5–15.5)
WBC: 5.9 10*3/uL (ref 4.0–10.5)
nRBC: 0 % (ref 0.0–0.2)

## 2019-07-10 MED ORDER — KETOROLAC TROMETHAMINE 15 MG/ML IJ SOLN: 15.0000 mg | Freq: Four times a day (QID) | INTRAMUSCULAR | Status: DC

## 2019-07-10 MED ORDER — APIXABAN 5 MG PO TABS: 5.0000 mg | ORAL_TABLET | Freq: Two times a day (BID) | ORAL | Status: DC

## 2019-07-10 MED ORDER — APIXABAN 5 MG PO TABS
5.0000 mg | ORAL_TABLET | Freq: Two times a day (BID) | ORAL | Status: DC
  Administered 2019-07-10 – 2019-08-14 (×70): 5 mg via ORAL
  Filled 2019-07-10 (×38): qty 1
  Filled 2019-07-10: qty 2
  Filled 2019-07-10 (×31): qty 1

## 2019-07-10 MED ORDER — SODIUM CHLORIDE 0.9 % IV SOLN
3.0000 g | Freq: Four times a day (QID) | INTRAVENOUS | Status: AC
  Administered 2019-07-10 – 2019-07-13 (×12): 3 g via INTRAVENOUS
  Filled 2019-07-10: qty 3
  Filled 2019-07-10: qty 8
  Filled 2019-07-10: qty 3
  Filled 2019-07-10: qty 8
  Filled 2019-07-10: qty 3
  Filled 2019-07-10: qty 8
  Filled 2019-07-10 (×4): qty 3
  Filled 2019-07-10 (×2): qty 8

## 2019-07-10 MED ORDER — ENSURE ENLIVE PO LIQD: 237.0000 mL | Freq: Two times a day (BID) | ORAL | 12 refills | Status: AC

## 2019-07-10 MED ORDER — RESOURCE THICKENUP CLEAR PO POWD
ORAL | Status: DC | PRN
  Filled 2019-07-10: qty 125

## 2019-07-10 MED ORDER — ORAL CARE MOUTH RINSE
15.0000 mL | Freq: Two times a day (BID) | OROMUCOSAL | Status: DC
  Administered 2019-07-10 – 2019-08-14 (×67): 15 mL via OROMUCOSAL

## 2019-07-10 MED ORDER — ACETAMINOPHEN 325 MG PO TABS
650.0000 mg | ORAL_TABLET | ORAL | Status: DC | PRN
  Administered 2019-07-11 – 2019-08-13 (×22): 650 mg via ORAL
  Filled 2019-07-10 (×23): qty 2

## 2019-07-10 MED ORDER — ROSUVASTATIN CALCIUM 20 MG PO TABS: 20.0000 mg | ORAL_TABLET | Freq: Every day | ORAL | Status: DC

## 2019-07-10 MED ORDER — ADULT MULTIVITAMIN W/MINERALS CH
1.0000 | ORAL_TABLET | Freq: Every day | ORAL | Status: DC
  Administered 2019-07-11 – 2019-07-24 (×14): 1
  Filled 2019-07-10 (×14): qty 1

## 2019-07-10 MED ORDER — ROSUVASTATIN CALCIUM 20 MG PO TABS
20.0000 mg | ORAL_TABLET | Freq: Every day | ORAL | Status: DC
  Administered 2019-07-10 – 2019-08-13 (×35): 20 mg via ORAL
  Filled 2019-07-10 (×36): qty 1

## 2019-07-10 MED ORDER — RESOURCE THICKENUP CLEAR PO POWD: 1.0000 | ORAL | Status: DC | PRN

## 2019-07-10 MED ORDER — FOLIC ACID 1 MG PO TABS
1.0000 mg | ORAL_TABLET | Freq: Every day | ORAL | Status: DC
  Administered 2019-07-11 – 2019-07-24 (×14): 1 mg
  Filled 2019-07-10 (×14): qty 1

## 2019-07-10 MED ORDER — SODIUM CHLORIDE 0.9 % IV SOLN: 3.0000 g | Freq: Four times a day (QID) | INTRAVENOUS | Status: DC

## 2019-07-10 MED ORDER — BLOOD PRESSURE CONTROL BOOK
Freq: Once | Status: AC
  Administered 2019-07-10: 18:00:00
  Filled 2019-07-10: qty 1

## 2019-07-10 MED ORDER — ENSURE ENLIVE PO LIQD
237.0000 mL | Freq: Two times a day (BID) | ORAL | Status: DC
  Administered 2019-07-11 – 2019-07-26 (×21): 237 mL via ORAL

## 2019-07-10 MED ORDER — SODIUM CHLORIDE 0.9 % IV SOLN
250.0000 mL | INTRAVENOUS | Status: DC
  Administered 2019-07-10 – 2019-07-13 (×6): 250 mL via INTRAVENOUS

## 2019-07-10 MED ORDER — SODIUM CHLORIDE 0.9 % IV SOLN: 250.0000 mL | INTRAVENOUS | 0 refills | Status: DC

## 2019-07-10 MED ORDER — ACETAMINOPHEN 160 MG/5ML PO SOLN: 650.0000 mg | ORAL | Status: DC | PRN

## 2019-07-10 NOTE — PMR Pre-admission (Signed)
PMR Admission Coordinator Pre-Admission Assessment  Patient: Kyle Garcia is an 67 y.o., male MRN: [id number] DOB: August 24, 1951 Height: '5\' 11"'  (180.3 cm) Weight: 67.3 kg  Insurance Information HMO:     PPO:      PCP:      IPA:      80/20:      OTHER: no HMO PRIMARY: Medicare a and b      Policy#: [id number]      Subscriber: pt Benefits:  Phone #: passport one online     Name: 7/13 Eff. Date: 12/24/2016     Deduct: [phone]      Out of Pocket Max: NONE      Life Max: NONE CIR: 100%      SNF: 20 full days Outpatient: 80%     Co-Pay: 20% Home Health: 100%      Co-Pay: none DME: 80%     Co-Pay: 205 Providers: pt choice  SECONDARY: [facility]      Policy#: [id number]      Subscriber: pt  Medicaid Application Date:       Case Manager:  Disability Application Date:       Case Worker:   The "Data Collection Information Summary" for patients in Inpatient Rehabilitation Facilities with attached "Privacy Act [place] Records" was provided and verbally reviewed with: Family  Emergency Contact Information Contact Information    Name Relation Home Work Mobile   Trull,JANET Spouse [phone]  [phone]      Current Medical History  Patient Admitting Diagnosis: CVA  History of Present Illness:  Kyle Garcia is a 68 year old right-handed male with history of recent right lower extremity DVT confirmed by Doppler 04/15/2019 maintained on Xarelto as well as multiple embolic cortical and subcortical bilateral infarcts identified on MRI from most recent admission in June 2020 during work-up of DVT, chronic anemia, stage IV adenocarcinoma of the right lung followed by [name] started on chemotherapy 06/17/2019, hyperlipidemia, patient quit smoking 4 months ago.   Presented 07/02/2013 with left side weakness, right gaze deviation.  Cranial CT/CTA positive for emergent large vessel occlusion with proximal right M2-M3 occlusion.  No TPA as patient was on Xarelto.  Patient  underwent cerebral angiogram followed by revascularization of occluded MCA dominant mid division with x2 passes also partial revascularization of occluded ACA A2-A3 region per interventional radiology.  Patient did require ventilatory support for a short time.  Follow-up MRI after revascularization shows multiple areas of acute infarction right parietal region and numerous punctate acute infarcts in both parietal regions left more than right.  Recent echocardiogram with ejection fraction of 30% normal systolic function.  No source of embolus.  Neurology follow-up patient initially on intravenous heparin and transitioned to Eliquis.  No plan for TEE and advised to continue Eliquis.  Patient with low-grade fever on 07/07/2019 with chest x-ray completed showing persistent infiltrate right lung and was placed on Unasyn.  Urine study negative nitrite.  Presently on a dysphagia #2 honey thick liquid diet.    Complete NIHSS TOTAL: 9  Patient's medical record from [facility]  has been reviewed by the rehabilitation admission coordinator and physician.  Past Medical History  Past Medical History:  Diagnosis Date  . Cancer ([place])    stage 4 adenocarcinoma right lung  . GERD (gastroesophageal reflux disease)   . Hyperlipidemia   . Stroke [facility])    25-30 emboli seen on screening MRI    Family History   family history is not on file.  Prior Rehab/Hospitalizations Has the patient had prior rehab or hospitalizations prior to admission? Yes  Has the patient had major surgery during 100 days prior to admission? No   Current Medications  Current Facility-Administered Medications:  .   stroke: mapping our early stages of recovery book, , Does not apply, Once, Aroor, Karena Addison R, MD .  0.9 %  sodium chloride infusion, 250 mL, Intravenous, Continuous, Rosalin Hawking, MD, Last Rate: 40 mL/hr at 07/10/19 0500 .  acetaminophen (TYLENOL) tablet 650 mg, 650 mg, Oral, Q4H PRN, 650 mg at 07/08/19 1426 **OR**  acetaminophen (TYLENOL) solution 650 mg, 650 mg, Per Tube, Q4H PRN, 650 mg at 07/09/19 0029 **OR** [DISCONTINUED] acetaminophen (TYLENOL) suppository 650 mg, 650 mg, Rectal, Q4H PRN, Deveshwar, Sanjeev, MD .  Ampicillin-Sulbactam (UNASYN) 3 g in sodium chloride 0.9 % 100 mL IVPB, 3 g, Intravenous, Q6H, Rosalin Hawking, MD, Last Rate: 200 mL/hr at 07/10/19 0409, 3 g at 07/10/19 0409 .  apixaban (ELIQUIS) tablet 5 mg, 5 mg, Oral, BID, Dang, Thuy D, RPH, 5 mg at 07/09/19 2142 .  chlorhexidine (PERIDEX) 0.12 % solution 15 mL, 15 mL, Mouth Rinse, BID, Rosalin Hawking, MD, 15 mL at 07/09/19 2142 .  feeding supplement (ENSURE ENLIVE) (ENSURE ENLIVE) liquid 237 mL, 237 mL, Oral, BID BM, Rosalin Hawking, MD, 237 mL at 07/09/19 1047 .  folic acid (FOLVITE) tablet 1 mg, 1 mg, Per Tube, Daily, Garvin Fila, MD, 1 mg at 07/09/19 1047 .  ketorolac (TORADOL) 15 MG/ML injection 15 mg, 15 mg, Intravenous, Q6H, Kerney Elbe, MD, 15 mg at 07/10/19 0636 .  multivitamin with minerals tablet 1 tablet, 1 tablet, Per Tube, Daily, Garvin Fila, MD, 1 tablet at 07/09/19 1047 .  [employer], , Oral, PRN, Rosalin Hawking, MD .  rosuvastatin (CRESTOR) tablet 20 mg, 20 mg, Oral, q1800, Agarwala, Ravi, MD, 20 mg at 07/09/19 1738  Patients Current Diet:  Diet Order            DIET DYS 2 Room service appropriate? Yes; Fluid consistency: Honey Thick  Diet effective now              Precautions / Restrictions Precautions Precautions: Fall Precaution Comments: pushes hard to L  Restrictions Weight Bearing Restrictions: No   Has the patient had 2 or more falls or a fall with injury in the past year? No  Prior Activity Level Community (5-7x/wk): Had not driven in 1 month pta due to excessive coughing spells per wife  Prior Functional Level Self Care: Did the patient need help bathing, dressing, using the toilet or eating? Independent  Indoor Mobility: Did the patient need assistance with walking from room to room  (with or without device)? Independent  Stairs: Did the patient need assistance with internal or external stairs (with or without device)? Independent  Functional Cognition: Did the patient need help planning regular tasks such as shopping or remembering to take medications? Independent  Home Assistive Devices / Equipment Home Assistive Devices/Equipment: None  Prior Device Use: Indicate devices/aids used by the patient prior to current illness, exacerbation or injury? None of the above  Current Functional Level Cognition  Arousal/Alertness: Awake/alert Overall Cognitive Status: Impaired/Different from baseline Current Attention Level: Sustained Orientation Level: Oriented X4 Following Commands: Follows one step commands with increased time, Follows one step commands inconsistently Safety/Judgement: Decreased awareness of deficits, Decreased awareness of safety General Comments: pt fixated on getting "something to drink" Attention: Sustained Sustained Attention: Impaired Sustained Attention Impairment: Verbal basic, Functional basic  Memory: Impaired Memory Impairment: Decreased recall of new information Awareness: Impaired Awareness Impairment: Intellectual impairment, Emergent impairment Problem Solving: Impaired Problem Solving Impairment: Verbal basic, Functional basic Safety/Judgment: Impaired    Extremity Assessment (includes Sensation/Coordination)  Upper Extremity Assessment: Defer to OT evaluation, LUE deficits/detail LUE Deficits / Details: No active movement LUE. Increased intermittent tone noted in LUE, noted some edema in hand LUE Sensation: decreased light touch(decreased pinch) LUE Coordination: decreased fine motor, decreased gross motor  Lower Extremity Assessment: LLE deficits/detail, RLE deficits/detail RLE Deficits / Details: Swelling present throughout esp in lower calf likely due to DVT. LLE Deficits / Details: No active movement throughout. Some trace in  hip flexors. Sustained clonus at ankle. No response to pressure, noxious stimulus. Able to feel cold hands. LLE Sensation: decreased light touch, decreased proprioception LLE Coordination: decreased fine motor, decreased gross motor    ADLs  Overall ADL's : Needs assistance/impaired Eating/Feeding: Cueing for safety, Minimal assistance Eating/Feeding Details (indicate cue type and reason): cues for chin tuck Grooming: Wash/dry face, Minimal assistance Grooming Details (indicate cue type and reason): using [place] on his own Upper Body Bathing: Moderate assistance, Bed level Lower Body Bathing: Total assistance, Bed level Upper Body Dressing : Maximal assistance, Bed level Lower Body Dressing: Total assistance, Bed level Toilet Transfer: Moderate assistance, +2 for physical assistance Toilet Transfer Details (indicate cue type and reason): lateral scoot from bed to drop arm recliner going to pt's right Toileting- Clothing Manipulation and Hygiene: Total assistance Toileting - Clothing Manipulation Details (indicate cue type and reason): Max A partial stand Functional mobility during ADLs: +2 for physical assistance, +2 for safety/equipment General ADL Comments: Pt with increased pushing tendencies this session, requiring +2 for upright sitting balance.  Unsafe to complete any transfers this session, did complete lateral scoot towards HOB with max assist +2    Mobility  Overal bed mobility: Needs Assistance Bed Mobility: Rolling, Sidelying to Sit, Sit to Sidelying Rolling: Max assist, +2 for safety/equipment Sidelying to sit: Max assist, +2 for physical assistance Supine to sit: Max assist, +2 for physical assistance Sit to supine: Max assist, +2 for safety/equipment Sit to sidelying: Total assist, +2 for physical assistance General bed mobility comments: vc for sequencing, multimodal cues and assist for R knee flexion, and hand over hand assist for reaching to L bed rail; assist at hips  and trunk to come into sitting     Transfers  Overall transfer level: Needs assistance Transfers: Lateral/Scoot Transfers Sit to Stand: (partial stand to place new alarm pad under him) Squat pivot transfers: +2 physical assistance, Max assist  Lateral/Scoot Transfers: +2 physical assistance, Max assist General transfer comment: unsafe to complete transfer OOB this session due to increase in pushing, therefore engaged in lateral scoots x2 along EOB with partial sit > stand +2    Ambulation / Gait / Stairs / Wheelchair Mobility  Ambulation/Gait General Gait Details: Unable    Posture / Balance Dynamic Sitting Balance Sitting balance - Comments: max A required to maintain sitting balance EOB with positioning of R UE to decrease pushing; worked on midline posture with therapist seated beside pt and briefly pt able to maintain sitting balance with mod A  Balance Overall balance assessment: Needs assistance Sitting-balance support: Single extremity supported, Feet supported Sitting balance-Leahy Scale: Poor(zero to poor) Sitting balance - Comments: max A required to maintain sitting balance EOB with positioning of R UE to decrease pushing; worked on midline posture with therapist seated beside pt and briefly pt able to  maintain sitting balance with mod A  Postural control: Left lateral lean(pushing to L with R UE) Standing balance support: During functional activity Standing balance-Leahy Scale: Zero Standing balance comment: Max A of 2 for standing    Special needs/care consideration BiPAP/CPAP n/a CPM  N/a Continuous Drip IV  N/a Dialysis n/a Life Vest  N/a Oxygen  N/a Special Bed  N/a Trach Size  N/a Wound Vac n/a Skin intact Bowel mgmt:  Incontinent LBM 7/16 Bladder mgmt: external catheter Diabetic mgmt: Hgb A1c 5.7 Behavioral consideration  N/a Chemo/radiation yes has just completed first round of chem for new dx of Lung ca   Previous Home Environment  Living Arrangements:  Spouse/significant other  Lives With: Spouse Available Help at Discharge: Family, Available 24 hours/day Type of Home: House Home Layout: One level Home Access: (through garage) Entrance Stairs-Rails: None Entrance Stairs-Number of Steps: 2 Bathroom Shower/Tub: Multimedia programmer: Standard Bathroom Accessibility: Yes How Accessible: Accessible via walker Home Care Services: No  Discharge Living Setting Plans for Discharge Living Setting: Patient's home, Lives with (comment)(wife) Type of Home at Discharge: House Discharge Home Layout: One level Discharge Home Access: Stairs to enter Entrance Stairs-Rails: None(entry through garage) Technical brewer of Steps: 2 Discharge Bathroom Shower/Tub: Horticulturist, commercial: Standard Discharge Bathroom Accessibility: Yes How Accessible: Accessible via walker Does the patient have any problems obtaining your medications?: No  Social/Family/Support Systems Patient Roles: Spouse, Parent Contact Information: wife, [name] Anticipated Caregiver: wife and adult local children, has son and daughter local Anticipated Caregiver's Contact Information: see above Ability/Limitations of Caregiver: wife is petite, children local and live close by, one daughter [name] is out of town Building control surveyor Availability: 24/7 Discharge Plan Discussed with Primary Caregiver: Yes Is Caregiver In Agreement with Plan?: Yes Does Caregiver/Family have Issues with Lodging/Transportation while Pt is in Rehab?: No  Goals/Additional Needs Patient/Family Goal for Rehab: min with PTand OT, supervision with SLP Expected length of stay: ELOS 14 to 20 days Pt/Family Agrees to Admission and willing to participate: Yes Program Orientation Provided & Reviewed with Pt/Caregiver Including Roles  & Responsibilities: Yes  Decrease burden of Care through IP rehab admission: n/a  Possible need for SNF placement upon discharge: not  anticipated  Patient Condition: I have reviewed medical records from [facility], spoken with CM, and patient, spouse and daughter. I met with patient at the bedside for inpatient rehabilitation assessment.  Patient will benefit from ongoing PT, can actively participate in 3 hours of therapy a day 5 days of the week, and can make measurable gains during the admission.  Patient will also benefit from the coordinated team approach during an Inpatient Acute Rehabilitation admission.  The patient will receive intensive therapy as well as Rehabilitation physician, nursing, social worker, and care management interventions.  Due to bladder management, bowel management, safety, skin/wound care, disease management, medication administration, pain management and patient education the patient requires 24 hour a day rehabilitation nursing.  The patient is currently mod to max with mobility and basic ADLs.  Discharge setting and therapy post discharge at home with home health is anticipated.  Patient has agreed to participate in the Acute Inpatient Rehabilitation Program and will admit today.  Preadmission Screen Completed By:  Cleatrice Burke RN MSN, 07/10/2019 11:40 AM ______________________________________________________________________   Discussed status with Dr. Naaman Plummer  on  07/10/2019 at  34 and received approval for admission today.  Admission Coordinator:  Cleatrice Burke, RN, time  3557 Date  07/10/2019  Assessment/Plan: Diagnosis: embolic right MCA infarct 1. Does the need for close, 24 hr/day Medical supervision in concert with the patient's rehab needs make it unreasonable for this patient to be served in a less intensive setting? Yes and Potentially 2. Co-Morbidities requiring supervision/potential complications: DVT, prior CVA, lung cancer 3. Due to bladder management, bowel management, safety, skin/wound care, disease management, medication administration, pain management and  patient education, does the patient require 24 hr/day rehab nursing? Yes 4. Does the patient require coordinated care of a physician, rehab nurse, PT (1-2 hrs/day, 5 days/week), OT (1-2 hrs/day, 5 days/week) and SLP (1-2 hrs/day, 5 days/week) to address physical and functional deficits in the context of the above medical diagnosis(es)? Yes Addressing deficits in the following areas: balance, endurance, locomotion, strength, transferring, bowel/bladder control, bathing, dressing, feeding, grooming, toileting, cognition, speech and psychosocial support 5. Can the patient actively participate in an intensive therapy program of at least 3 hrs of therapy 5 days a week? Yes 6. The potential for patient to make measurable gains while on inpatient rehab is excellent 7. Anticipated functional outcomes upon discharge from inpatients are: min assist PT, min assist OT, supervision SLP 8. Estimated rehab length of stay to reach the above functional goals is: 14-20 days 9. Anticipated D/C setting: Home 10. Anticipated post D/C treatments: [place] therapy 11. Overall Rehab/Functional Prognosis: excellent  MD Signature: [name], MD, [facility] 07/10/2019

## 2019-07-10 NOTE — Discharge Summary (Addendum)
Stroke Discharge Summary  Patient ID: Kyle Garcia   MRN: [id number]      DOB: Oct 05, 1951  Date of Admission: 07/03/2019 Date of Discharge: 07/10/2019  Attending Physician:  Rosalin Hawking, MD, Stroke MD Consultant(s):   Olene Craven) Estanislado Pandy, MD (Interventional Neuroradiologist) Patient's PCP:  Susy Frizzle, MD  Discharge Diagnoses:  Principal Problem:   Acute right arterial ischemic stroke, middle cerebral artery (MCA) ([place]) s/p mechanical thrombectomy Active Problems:   Hyperlipidemia   Aspiration pneumonia ([place])   DVT (deep venous thrombosis) ([place])   Adenocarcinoma of right lung, stage 4 ([place])   Middle cerebral artery embolism, right   Hx of ischemic multifocal multiple vascular territories stroke   Dysphagia due to recent cerebrovascular accident   Fever  Past Medical History:  Diagnosis Date  . Cancer ([place])    stage 4 adenocarcinoma right lung  . GERD (gastroesophageal reflux disease)   . Hyperlipidemia   . Stroke [facility])    25-30 emboli seen on screening MRI   Past Surgical History:  Procedure Laterality Date  . CATARACT EXTRACTION  2016  . EYE SURGERY    . IR CT HEAD LTD  07/03/2019  . IR PERCUTANEOUS ART THROMBECTOMY/INFUSION INTRACRANIAL INC DIAG ANGIO  07/03/2019  . RADIOLOGY WITH ANESTHESIA N/A 07/03/2019   Procedure: IR WITH ANESTHESIA;  Surgeon: Luanne Bras, MD;  Location: [place];  Service: Radiology;  Laterality: N/A;  . VIDEO BRONCHOSCOPY Bilateral 05/21/2019   Procedure: VIDEO BRONCHOSCOPY WITH FLUORO;  Surgeon: Collene Gobble, MD;  Location: [facility] ENDOSCOPY;  Service: Cardiopulmonary;  Laterality: Bilateral;    Medications to be continued on Rehab Allergies as of 07/10/2019   No Known Allergies     Medication List    STOP taking these medications   pravastatin 20 MG tablet Commonly known as: PRAVACHOL   prochlorperazine 10 MG tablet Commonly known as: COMPAZINE   Rivaroxaban 15 & 20 MG Tbpk     TAKE these medications    acetaminophen 500 MG tablet Commonly known as: TYLENOL Take 1,000 mg by mouth every 6 (six) hours as needed for headache.   Ampicillin-Sulbactam 3 g in sodium chloride 0.9 % 100 mL Inject 3 g into the vein every 6 (six) hours.   apixaban 5 MG Tabs tablet Commonly known as: ELIQUIS Take 1 tablet (5 mg total) by mouth 2 (two) times daily.   Docusate Sodium 100 MG capsule Take 100 mg by mouth 2 (two) times daily.   feeding supplement (ENSURE ENLIVE) Liqd Take 237 mLs by mouth 2 (two) times daily between meals.   folic acid 1 MG tablet Commonly known as: FOLVITE Take 1 tablet (1 mg total) by mouth daily.   HYDROcodone-homatropine 5-1.5 MG/5ML syrup Commonly known as: HYCODAN Take 5 mLs by mouth every 6 (six) hours as needed for cough.   ketorolac 15 MG/ML injection Commonly known as: TORADOL Inject 1 mL (15 mg total) into the vein every 6 (six) hours.   omeprazole 40 MG capsule Commonly known as: PRILOSEC Take 1 capsule (40 mg total) by mouth daily.   PreserVision AREDS 2 Caps Take 1 capsule by mouth 2 (two) times daily. What changed: Another medication with the same name was removed. Continue taking this medication, and follow the directions you see here.   REFRESH OP Place 1 drop into both eyes every evening.   Resource ThickenUp Clear Powd Take 120 g by mouth as needed (honey thick liquids).   rosuvastatin 20 MG tablet Commonly known as:  CRESTOR Take 1 tablet (20 mg total) by mouth daily at 6 PM.   sodium chloride 0.9 % infusion Inject 250 mLs into the vein continuous.       LABORATORY STUDIES CBC    Component Value Date/Time   WBC 5.9 07/10/2019 0555   RBC 3.04 (L) 07/10/2019 0555   HGB 8.3 (L) 07/10/2019 0555   HGB 10.3 (L) 07/01/2019 1059   HCT 26.0 (L) 07/10/2019 0555   PLT 151 07/10/2019 0555   PLT 215 07/01/2019 1059   MCV 85.5 07/10/2019 0555   MCH 27.3 07/10/2019 0555   MCHC 31.9 07/10/2019 0555   RDW 15.9 (H) 07/10/2019 0555   LYMPHSABS  0.4 (L) 07/04/2019 0544   MONOABS 0.7 07/04/2019 0544   EOSABS 0.0 07/04/2019 0544   BASOSABS 0.0 07/04/2019 0544   CMP    Component Value Date/Time   NA 132 (L) 07/10/2019 0555   K 4.3 07/10/2019 0555   CL 104 07/10/2019 0555   CO2 20 (L) 07/10/2019 0555   GLUCOSE 101 (H) 07/10/2019 0555   BUN 19 07/10/2019 0555   CREATININE 0.77 07/10/2019 0555   CREATININE 0.86 07/01/2019 1059   CREATININE 0.85 12/02/2018 0906   CALCIUM 7.5 (L) 07/10/2019 0555   PROT 5.5 (L) 07/03/2019 0205   ALBUMIN 2.7 (L) 07/03/2019 0205   AST 23 07/03/2019 0205   AST 15 07/01/2019 1059   ALT 14 07/03/2019 0205   ALT 16 07/01/2019 1059   ALKPHOS 109 07/03/2019 0205   BILITOT 0.6 07/03/2019 0205   BILITOT 0.5 07/01/2019 1059   GFRNONAA >60 07/10/2019 0555   GFRNONAA >60 07/01/2019 1059   GFRNONAA 91 12/02/2018 0906   GFRAA >60 07/10/2019 0555   GFRAA >60 07/01/2019 1059   GFRAA 105 12/02/2018 0906   COAGS Lab Results  Component Value Date   INR 3.6 (H) 07/03/2019   Lipid Panel    Component Value Date/Time   CHOL 117 07/03/2019 0500   TRIG 93 07/03/2019 0500   HDL 29 (L) 07/03/2019 0500   CHOLHDL 4.0 07/03/2019 0500   VLDL 19 07/03/2019 0500   LDLCALC 69 07/03/2019 0500   LDLCALC 99 12/02/2018 0906   HgbA1C  Lab Results  Component Value Date   HGBA1C 5.7 (H) 07/03/2019   Urinalysis    Component Value Date/Time   COLORURINE YELLOW 07/07/2019 1546   APPEARANCEUR HAZY (A) 07/07/2019 1546   LABSPEC 1.018 07/07/2019 1546   PHURINE 7.0 07/07/2019 1546   GLUCOSEU NEGATIVE 07/07/2019 1546   HGBUR NEGATIVE 07/07/2019 1546   BILIRUBINUR NEGATIVE 07/07/2019 1546   KETONESUR NEGATIVE 07/07/2019 1546   PROTEINUR NEGATIVE 07/07/2019 1546   NITRITE NEGATIVE 07/07/2019 1546   LEUKOCYTESUR NEGATIVE 07/07/2019 1546   Urine Drug Screen     Component Value Date/Time   LABOPIA POSITIVE (A) 07/03/2019 0258   COCAINSCRNUR NONE DETECTED 07/03/2019 0258   LABBENZ NONE DETECTED 07/03/2019 0258    AMPHETMU NONE DETECTED 07/03/2019 0258   THCU NONE DETECTED 07/03/2019 0258   LABBARB NONE DETECTED 07/03/2019 0258    Alcohol Level    Component Value Date/Time   ETH <10 07/03/2019 0205     SIGNIFICANT DIAGNOSTIC STUDIES Ct Head Code Stroke Wo Contrast 07/03/2019 1. Positive CTA for emergent large vessel occlusion, with proximal right M2/M3 occlusion. 2. Otherwise wide patency of the major arterial vasculature of the head and neck.   Ct Code Stroke Cta Head W/wo Contrast Ct Code Stroke Cta Neck W/wo Contrast 07/03/2019 1. Positive CTA for emergent large  vessel occlusion, with proximal right M2/M3 occlusion. 2. Otherwise wide patency of the major arterial vasculature of the head and neck.   Ct Cerebral Perfusion W Contrast 07/03/2019 Positive CT perfusion with acute core infarct involving the mid and posterior right MCA territory, estimated volume 33 cc. Moderate-sized surrounding penumbra.   Cerebral angiogram 07/03/2019 Status post endovascular complete revascularization of the occluded right middle cerebral artery distribution prominent middle branch of the trifurcation region, with 2 passes with the 5 mm x 33 mm Embotrap retrieval device, and 2 passes with the 4 mm x 40 mm Solitaire retrieval device achieving a TICI 3 revascularization. Partial revascularization of the occluded right anterior cerebral artery A2 A3 region, with 1 pass with the 4 mm x 40 mm Solitaire FR retrieval device with improved recanalization. PLAN: Follow-up as per patient's referring MD.   Ct Angio Chest Pe W And/or Wo Contrast 06/20/2019  1. No demonstrable pulmonary embolus. No thoracic aortic aneurysm or dissection. 2. Multifocal airspace opacity consistent with, most pronounced in the right lower lobe and a portion of the lateral segment right middle lobe. 3. In comparison with prior PET study, right upper lobe nodular lesion is smaller and less well-defined. 4. Slight increase in size of left hilar lymph  node, known by PET to have increased metabolic activity. Neoplastic involvement of this lymph node is felt to be present. 5.  Stable bronchiectatic change in the right upper lobe.   Mr Brain 43 Contrast Mr [name] Contrast 06/11/2019 25-30 punctate foci of restricted diffusion scattered within the left cerebellum and both cerebral hemispheres, with slightly differing levels of restricted diffusion. 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 7 or 8 of the small foci show contrast enhancement, but the majority do not. I cannot, at this point, completely exclude the possibility metastatic disease at some of these foci. Therefore, imaging follow-up will be necessary.   Dg Abd Portable 1v 07/03/2019 Nasogastric tube seen in proximal stomach. No evidence of bowel obstruction or ileus.  Vas [place] Transcranial Doppler W Bubbles 07/06/2019  A vascular evaluation was performed. The left middle cerebral artery was studied. An IV was inserted into the patient's right antecubital fossa. Verbal informed consent was obtained.  No evidence of HITS (high intensity transient signals) at rest or with manual valsalva maneuver. Therefore, there is no evidence of clinically significant PFO (patent foramen ovale). Negative TCD Bubble study  Vas [place] Lower Extremity Venous (dvt) (only Mc & Wl 7a-7p) 06/21/2019 Right: Findings consistent with acute deep vein thrombosis involving the right common femoral vein, right femoral vein, right proximal profunda vein, right popliteal vein, and right peroneal veins. Findings consistent with acute superficial vein thrombosis involving the right great saphenous vein, and right small saphenous vein. No cystic structure found in the popliteal fossa. There is no evidence of DVT involving the external iliac, and common illiac veins.  Left: No evidence of common femoral vein obstruction. No cystic structure found in the popliteal fossa.      2D Echocardiogram  06/30/2019 1. The left ventricle has normal systolic function with an ejection fraction of 60-65%. The cavity size was normal. Left ventricular diastolic Doppler parameters are consistent with impaired relaxation. No evidence of left ventricular regional wall  motion abnormalities. 2. The right ventricle has normal systolic function. The cavity was normal. There is no increase in right ventricular wall thickness. 3. Trivial pericardial effusion is present. 4. No evidence of mitral valve  stenosis. Mild mitral regurgitation. 5. The aortic valve is tricuspid. No stenosis of the aortic valve. 6. Normal IVC size. No complete TR doppler jet so unable to estimate PA systolic pressure.  Chest x-ray  07/09/2019 1. Stable pneumonia in the RIGHT mid lung since the examination 2 days ago, improved since 6 days ago. 2. No new abnormalities. 07/07/2019  Removal of ET tube. Persistent infiltrate in the right lung. Recommend follow-up to resolution. 07/03/2019 1.  Endotracheal tube noted with its tip 5 cm above the carina. 2. Prominent right mid lung field infiltrate again noted without interim change.      HISTORY OF PRESENT ILLNESS Karl Erway is a 67 y.o. male with past medical history of DVT on Xarelto, adenocarcinoma of the right lung, hyperlipidemia, prior strokes noted on MRI presents to the ED as a stroke alert for left-sided weakness.  His last known well was 10 PM on 07/02/2019 when he went to bed he woke up around midnight and noticed that he was unable to move the left side of his body.  EMS was called and on arrival patient had forced right gaze deviation, left-sided neglect and plegia.  He was awake and oriented.  He had taken Xarelto at 5 PM last night.  Stat CT head was negative for hemorrhage.  CTA was suggestive for a right M2 M3 occlusion with CT perfusion showing a large perfusion mismatch (124 cc penumbra 30 cc core).  He was not a tPA candidate given Xarelto use.  He was taken to IR for mechanical thrombectomy. NIHSS: 18. Baseline MRS 0.  HOSPITAL COURSE Mr. Jeffory Snelgrove is a 68 y.o. male with history of DVT on Xarelto, adenocarcinoma of the right lung, hyperlipidemia, prior strokes noted on MRI presenting with L sided weakness and R gaze. No tPA d/t xarelto.   Stroke:   Embolic shower right MCA occlusion s/p IR with TICI3 reperfusion- infarct embolic secondary to hypercoagulable state from lung cancer even on Xarelto for DVT. Marantic endocarditis due to advanced malignancy cannot be ruled out.  Code Stroke CT head No acute abnormality.     CTA head & neck ELVO prox R M2/M3 occlusion  CT perfusion Mod-sized penumbra  Cerebral angio R MCA occlusion, TICI3 revascularization; partial revascularization occluded ACA A2 A3  Post IR CT slight R SAH in R perisylvian fissure and anterior interhemispheric fissure. No gross mass effect.  MRI  Multiple infarcts R parietal and R cingulate gyrus. Numerous punctate L>R parietal due to small emboli  2D Echo EF 60-65%. No source of embolus   TCD w/ bubble negative for PFO  Will not pursue TEE to rule out marantic endocarditis given no change of therapy  LDL - 69  HgbA1c - 5.7  Eliquis for VTE prophylaxis  Xarelto (rivaroxaban) daily prior to admission, now on Eliquis 5mg  bid   Therapy recommendations:  CIR  Disposition:  CIR  History of recent stroke also likely related to hypercoagulable state  MRI on 06/10/2019 showed multiple embolic cortical and subcortical bilateral infarcts.    2D echo at that time was unremarkable.   Continued on Xarelto -> now switch to eliquis  Dysphagia Malnutrition  Secondary to stroke  Passed barium study   had cough with food intermittently. SLP reassessed, now on dysphagia 2 diet with honey thick liquid  Added Ensure Enlive  Speech on board  Increased needs d/t cancer dx  Fever with aspiration pneumonia  TM  100.5->100.5->101.8->100.7->102.1->afebrile  WBC 6.3->5.0->4.8->4.9->5.9  CXR stable R mid lung PNA  x 2 days, improved since 6 days ago.  UA neg  Gentle IV hydration @ 40  Continue Unasyn 3gm q6h for total 5 days, 7/15>>   blood cultures pending  R leg DVT/SVT  Confirmed by doppler 04/15/2019 and 06/20/2019  On xarelto PTA  Treated with heparin IV in hospital, tolerating well  Now on Eliquis 5 bid  Stage 4 adenocarcinoma R lung  Hold off chemo now  Follow with Dr. Julien Nordmann  Hyperlipidemia  Home meds:  pravachol 20  LDL 69, goal < 70  Now on Crestor 20 mg daily  Continue statin at discharge   Other Stroke Risk Factors  Advanced age  Former Cigarette smoker, quit 4 mos ago, advised to continue cessation of smoking  ETOH use, advised to drink no more than 2 drink(s) a day  Other Active Problems  GERD  Anemia due to chronic disease- Hb 9.9-7.7-7.4-7.8-7.5-8.0-7.8-8.3  Hypokalemia - 3.1 -> 3.4 ->3.2 -> 4.1->3.8->4.3  Neck pain, muscular treated w/ ativan-> scheduled toradol dosing   DISCHARGE EXAM Blood pressure 135/79, pulse (!) 101, temperature 98.1 F (36.7 C), temperature source Oral, resp. rate 16, height 5\' 11"  (1.803 m), weight 67.3 kg, SpO2 96 %. General - Well nourished, well developed, not in acute distress, lethargic.  Ophthalmologic - fundi not visualized due to noncooperation.  Cardiovascular - Regular rhythm, but mild tachycardia.  Mental Status -  Lethargic, able to open eyes on request, orientation to time, place, and person were intact. Language including expression, naming, repetition, comprehension was assessed and found intact, however mild dysarthria.  Cranial Nerves II - XII - II - left lower quadrantanopia. III, IV, VI - Extraocular movements intact, no gaze deviation. V - Facial sensation intact bilaterally. VII - left facial droop. VIII - Hearing & vestibular intact bilaterally. X - Palate elevates not able to see  well, but mild dysarthria. XI - Chin turning & shoulder shrug intact bilaterally. XII - Tongue protrusion intact.  Motor Strength - The patient's strength was 4/5 RUE and 3/5 RLE but hemiplegia on the left.  Bulk was normal and fasciculations were absent.   Motor Tone - Muscle tone was assessed at the neck and appendages and was decreased on the left.  Reflexes - The patient's reflexes were symmetrical in all extremities and he had triple reflex on the left.  Sensory - Light touch, temperature/pinprick were assessed and were symmetrical.    Coordination - The patient had normal movements in the right hand with no ataxia or dysmetria.  Tremor was absent.  Gait and Station - deferred.  Discharge Diet  Dysphagia 2 honey thick liquids  DISCHARGE PLAN  Disposition:  Transfer to [place] for ongoing PT, OT and ST  Eliquis (apixaban) daily for secondary stroke prevention   Recommend ongoing stroke risk factor control by Primary Care Physician at time of discharge from inpatient rehabilitation.  Follow-up Susy Frizzle, MD in 2 weeks following discharge from rehab.  Follow-up in [facility] in 4 weeks following discharge from rehab, office to schedule an appointment.   Follow-up oncology Dr. Julien Nordmann after rehab stay  45 minutes were spent preparing discharge.  Rosalin Hawking, MD PhD Stroke Neurology 07/10/2019 6:35 PM

## 2019-07-10 NOTE — Progress Notes (Addendum)
  Speech Language Pathology Treatment: Dysphagia;Cognitive-Linquistic  Patient Details Name: Kyle Garcia MRN: [id number] DOB: [birth year] Today's Date: 07/10/2019 Time: 3419-3790 SLP Time Calculation (min) (ACUTE ONLY): 22 min  Assessment / Plan / Recommendation Clinical Impression  Pt seen for dysphagia and cognitive therapy. Max verbal cues to turn head to left with eyes reaching midline infrequently and briefly. Increased head rotation to right noted  today with SLP providing slow stretching to his left. He needed frequent repetition of current month with pt consistently responding "September" and was unable to problem accurate response with max cues.   He demonstrated a delayed cough x 1 with honey liquid consumption. Pt frequently coughs during meals and following thick liquids. It was determined during MBS that he is hypersensitve to even small amount of barium that barely enter his laryngeal vestibule. His cough cleared the laryngeal vestibule. He did not pocket trial of graham cracker with timely transit. During an entire meal if alertness if decreased, he may exhibit increased oral impairments. Pt is scheduled to transit to CIR today with continued intervention of cognition and swallow.   HPI HPI: 68yo M with history of R DVT on Xarelto, adenocarcinoma of the R lung, prior strokes on imaging, HLD, and GERD, who p/w left-sided hemiplegia and R gaze deviation. Found to have R MCA and ACA occlusions. Now s/p mechanical thrombectomy with VIR on 7/10. MBS 7/12 recommended nectar with chin tuck. Pt has exhibited clinical signs of aspiration with nectar thick using chin tuck. Repeat MBS warranted.         SLP Plan  Continue with current plan of care       Recommendations  Diet recommendations: Honey-thick liquid;Dysphagia 2 (fine chop) Liquids provided via: Cup Medication Administration: Crushed with puree Supervision: Full supervision/cueing for compensatory strategies;Patient able to  self feed Compensations: Small sips/bites;Slow rate Postural Changes and/or Swallow Maneuvers: Seated upright 90 degrees                Oral Care Recommendations: Oral care BID Follow up Recommendations: Inpatient Rehab SLP Visit Diagnosis: Dysphagia, oropharyngeal phase (R13.12);Cognitive communication deficit (W40.973) Plan: Continue with current plan of care       GO                Houston Siren 07/10/2019, 1:41 PM  Orbie Pyo Colvin Caroli.Ed Risk analyst [phone] Office [phone]

## 2019-07-10 NOTE — TOC Transition Note (Signed)
Transition of Care [facility]) - CM/SW Discharge Note   Patient Details  Name: Kyle Garcia MRN: [id number] Date of Birth: January 24, 1951  Transition of [facility]) CM/SW Contact:  Pollie Friar, RN Phone Number: 07/10/2019, 12:19 PM   Clinical Narrative:    Pt discharging to CIR today. CM signing off.   Final next level of care: IP Rehab Facility Barriers to Discharge: No Barriers Identified   Patient Goals and CMS Choice        Discharge Placement                       Discharge Plan and Services                                     Social Determinants of Health (SDOH) Interventions     Readmission Risk Interventions No flowsheet data found.

## 2019-07-10 NOTE — Plan of Care (Signed)
Patient stable, discussed POC with patient, agreeable with plan, denies question/concerns at this time.  

## 2019-07-10 NOTE — Progress Notes (Signed)
Physical Therapy Treatment Patient Details Name: Kyle Garcia MRN: [id number] DOB: 01-15-1951 Today's Date: 07/10/2019    History of Present Illness Patient is a 68 y/o male who presents with left sided weakness. Head CT-Positive CTA for emergent large vessel occlusion, with proximal right M2/M3 occlusion. Brain MRI- large right MCA/ACA infarct with smaller infarcts consistent with embolic phenomenon (47-65 infarcts seen). Now s/p mechanical thrombectomy on 7/10. Recent diagnosis of RLE DVT (6/27) on Xarelto, HLD and stage IV adenocarcinoma of the lung in May 2020.    PT Comments    Patient seen for mobility progression. Current plan remains appropriate.    Follow Up Recommendations  CIR;Supervision for mobility/OOB;Supervision/Assistance - 24 hour     Equipment Recommendations  Other (comment)(TBD next venue)    Recommendations for Other Services       Precautions / Restrictions Precautions Precautions: Fall Precaution Comments: pushes hard to L and posterior Restrictions Weight Bearing Restrictions: No    Mobility  Bed Mobility Overal bed mobility: Needs Assistance Bed Mobility: Rolling;Sidelying to Sit;Sit to Sidelying Rolling: Max assist;+2 for safety/equipment Sidelying to sit: Max assist;+2 for physical assistance     Sit to sidelying: Total assist;+2 for physical assistance General bed mobility comments: VCs for sequencing and for LUE to use rail.  Transfers Overall transfer level: Needs assistance   Transfers: Sit to/from Stand Sit to Stand: Total assist;+2 physical assistance;+2 safety/equipment;From elevated surface         General transfer comment: totalA +2 for sit to stand; pt leaning to L side and onto OT's shoulder and unable to correct posture in part due to puching with R UE or releasing R UE and lacking strength to stay upright; assist with bed pad for hip extension   Ambulation/Gait                 Stairs             Wheelchair  Mobility    Modified Rankin (Stroke Patients Only)       Balance Overall balance assessment: Needs assistance Sitting-balance support: Single extremity supported;Feet supported Sitting balance-Leahy Scale: Poor Sitting balance - Comments: Pt requiring assist for L lateral lean and to lean on R elbow for wwt bear task- pt reports pain in leg and back when leaning on R arm. Postural control: Left lateral lean   Standing balance-Leahy Scale: Zero                              Cognition Arousal/Alertness: Awake/alert Behavior During Therapy: Flat affect Overall Cognitive Status: Impaired/Different from baseline Area of Impairment: Safety/judgement;Following commands;Awareness;Problem solving                       Following Commands: Follows one step commands with increased time;Follows one step commands inconsistently Safety/Judgement: Decreased awareness of deficits;Decreased awareness of safety   Problem Solving: Decreased initiation;Difficulty sequencing;Requires verbal cues;Requires tactile cues General Comments: stating "has anyone seen my dog?"      Exercises      General Comments        Pertinent Vitals/Pain Pain Assessment: Faces Faces Pain Scale: Hurts little more Pain Location: RLE and neck Pain Descriptors / Indicators: Sore Pain Intervention(s): Limited activity within patient's tolerance;Monitored during session;Repositioned    Home Living                      Prior Function  PT Goals (current goals can now be found in the care plan section) Acute Rehab PT Goals Patient Stated Goal: to go home Progress towards PT goals: Progressing toward goals    Frequency    Min 4X/week      PT Plan Current plan remains appropriate    Co-evaluation PT/OT/SLP Co-Evaluation/Treatment: Yes Reason for Co-Treatment: Complexity of the patient's impairments (multi-system involvement);For patient/therapist safety;Necessary  to address cognition/behavior during functional activity;To address functional/ADL transfers PT goals addressed during session: Mobility/safety with mobility;Balance        AM-PAC PT "6 Clicks" Mobility   Outcome Measure  Help needed turning from your back to your side while in a flat bed without using bedrails?: A Lot Help needed moving from lying on your back to sitting on the side of a flat bed without using bedrails?: A Lot Help needed moving to and from a bed to a chair (including a wheelchair)?: Total Help needed standing up from a chair using your arms (e.g., wheelchair or bedside chair)?: Total Help needed to walk in hospital room?: Total Help needed climbing 3-5 steps with a railing? : Total 6 Click Score: 8    End of Session Equipment Utilized During Treatment: Gait belt Activity Tolerance: Patient tolerated treatment well Patient left: in bed;with call bell/phone within reach;with bed alarm set Nurse Communication: Mobility status;Need for lift equipment PT Visit Diagnosis: Hemiplegia and hemiparesis;Difficulty in walking, not elsewhere classified (R26.2) Hemiplegia - Right/Left: Left Hemiplegia - dominant/non-dominant: Non-dominant Hemiplegia - caused by: Cerebral infarction     Time: 5170-0174 PT Time Calculation (min) (ACUTE ONLY): 29 min  Charges:  $Gait Training: 8-22 mins                     Earney Navy, PTA Acute Rehabilitation Services Pager: [phone] Office: [phone]     Darliss Cheney 07/10/2019, 3:26 PM

## 2019-07-10 NOTE — Progress Notes (Signed)
Occupational Therapy Treatment Patient Details Name: Kyle Garcia MRN: [id number] DOB: Feb 16, 1951 Today's Date: 07/10/2019    History of present illness Patient is a 68 y/o male who presents with left sided weakness. Head CT-Positive CTA for emergent large vessel occlusion, with proximal right M2/M3 occlusion. Brain MRI- large right MCA/ACA infarct with smaller infarcts consistent with embolic phenomenon (76-81 infarcts seen). Now s/p mechanical thrombectomy on 7/10. Recent diagnosis of RLE DVT (6/27) on Xarelto, HLD and stage IV adenocarcinoma of the lung in May 2020.   OT comments  Pt continues to progress with therapy. Pt totalA to maxA +2 for bed mobility with heavy lean to L. When pt directed to rest on R elbow, pt with posterior lean and reports pain. Stedy attempted for transfer, but pt unable to keep head lifted to complete task and LUE unable to hold onto stedy. Pt sat EOB ~15 mins with nearly totalA to reduce lean. Pt unable to keep eyes open for session. Pt totalA for LB ADL for perineal hygiene. Pt motivated. Pt initiating movement on R side only. Pt would greatly benefit from continued OT skilled services for ADL, mobility and safety in CIR setting. OT following acutely.    Follow Up Recommendations  CIR;Supervision/Assistance - 24 hour    Equipment Recommendations  Other (comment)(to ber determined)    Recommendations for Other Services      Precautions / Restrictions Precautions Precautions: Fall Precaution Comments: pushes hard to L and posterior Restrictions Weight Bearing Restrictions: No       Mobility Bed Mobility Overal bed mobility: Needs Assistance Bed Mobility: Rolling;Sidelying to Sit;Sit to Sidelying Rolling: Max assist;+2 for safety/equipment Sidelying to sit: Max assist;+2 for physical assistance     Sit to sidelying: Total assist;+2 for physical assistance General bed mobility comments: VCs for sequencing and for LUE to use  rail.  Transfers Overall transfer level: Needs assistance     Sit to Stand: Total assist;+2 physical assistance;+2 safety/equipment;From elevated surface         General transfer comment: totalA +2 for sit to stand; unable to perform transfer due to decreased mobility and decreased ability ti lift head from resting on shoulders.    Balance Overall balance assessment: Needs assistance Sitting-balance support: Single extremity supported;Feet supported Sitting balance-Leahy Scale: Poor Sitting balance - Comments: Pt requiring assist for L lateral lean and to lean on R elbow for wwt bear task- pt reports pain in leg and back when leaning on R arm. Postural control: Left lateral lean                                 ADL either performed or assessed with clinical judgement   ADL Overall ADL's : Needs assistance/impaired                                     Functional mobility during ADLs: Total assistance;+2 for physical assistance;+2 for safety/equipment General ADL Comments: Pt with increased pushing tendencies this session, requiring +2 for upright sitting balance.  Unsafe to complete any transfers this session; Pt attempting use of stedy +2, but unsafe as pt with such severe lateral lean. max assist +2 for all mobility throughout session.     Vision   Vision Assessment?: Vision impaired- to be further tested in functional context Additional Comments: Pt unable to keep eyes open throughout session.  Perception     Praxis      Cognition Arousal/Alertness: Lethargic Behavior During Therapy: Flat affect Overall Cognitive Status: Impaired/Different from baseline Area of Impairment: Safety/judgement;Following commands;Awareness;Problem solving                       Following Commands: Follows one step commands with increased time;Follows one step commands inconsistently Safety/Judgement: Decreased awareness of deficits;Decreased awareness  of safety   Problem Solving: Decreased initiation;Difficulty sequencing;Requires verbal cues;Requires tactile cues General Comments: stating "has anyone seen my dog?"        Exercises     Shoulder Instructions       General Comments      Pertinent Vitals/ Pain       Pain Assessment: Faces Faces Pain Scale: Hurts little more Pain Location: RLE  Pain Descriptors / Indicators: Sore Pain Intervention(s): Monitored during session;Repositioned  Home Living     Available Help at Discharge: Family;Available 24 hours/day   Home Access: (through garage)   Entrance Stairs-Rails: None       Bathroom Shower/Tub: Occupational psychologist: Standard Bathroom Accessibility: Yes How Accessible: Accessible via walker            Prior Functioning/Environment              Frequency  Min 3X/week        Progress Toward Goals  OT Goals(current goals can now be found in the care plan section)  Progress towards OT goals: Progressing toward goals  Acute Rehab OT Goals Patient Stated Goal: to go home OT Goal Formulation: With patient Time For Goal Achievement: 07/19/19 Potential to Achieve Goals: Good ADL Goals Pt Will Perform Grooming: with min assist;sitting Pt Will Perform Upper Body Bathing: with min assist;sitting Pt Will Transfer to Toilet: with min assist;squat pivot transfer;stand pivot transfer Pt Will Perform Toileting - Clothing Manipulation and hygiene: with mod assist Additional ADL Goal #1: Pt will be able to get in and OOB for basic ADLs with Min A Additional ADL Goal #2: Pt will able to find 2 out of 4 items to left of midline with min cues  Plan Discharge plan remains appropriate    Co-evaluation    PT/OT/SLP Co-Evaluation/Treatment: Yes Reason for Co-Treatment: Complexity of the patient's impairments (multi-system involvement)          AM-PAC OT "6 Clicks" Daily Activity     Outcome Measure   Help from another person eating meals?:  A Lot Help from another person taking care of personal grooming?: A Lot Help from another person toileting, which includes using toliet, bedpan, or urinal?: Total Help from another person bathing (including washing, rinsing, drying)?: A Lot Help from another person to put on and taking off regular upper body clothing?: Total Help from another person to put on and taking off regular lower body clothing?: Total 6 Click Score: 9    End of Session Equipment Utilized During Treatment: Gait belt  OT Visit Diagnosis: Unsteadiness on feet (R26.81);Other abnormalities of gait and mobility (R26.89);Muscle weakness (generalized) (M62.81);Low vision, both eyes (H54.2);Other symptoms and signs involving cognitive function;Hemiplegia and hemiparesis Hemiplegia - Right/Left: Left Hemiplegia - dominant/non-dominant: Non-Dominant Hemiplegia - caused by: Cerebral infarction   Activity Tolerance Patient tolerated treatment well   Patient Left in bed;with call bell/phone within reach;with bed alarm set;with nursing/sitter in room   Nurse Communication Mobility status        Time: 4742-5956 OT Time Calculation (min): 32 min  Charges: OT  General Charges $OT Visit: 1 Visit OT Treatments $Neuromuscular Re-education: 8-22 mins  Kyle Garcia) Kyle Garcia OTR/L Acute Rehabilitation Services Pager: [phone] Office: [phone]    Kyle Garcia 07/10/2019, 1:20 PM

## 2019-07-10 NOTE — H&P (Signed)
Physical Medicine and Rehabilitation Admission H&P        Chief Complaint  Patient presents with   Code Stroke  : HPI: Kyle Garcia is a 68 year old right-handed male with history of recent right lower extremity DVT confirmed by Doppler 04/15/2019 maintained on Xarelto as well as multiple embolic cortical and subcortical bilateral infarcts identified on MRI from most recent admission in June 2020 during work-up of DVT, chronic anemia, stage IV adenocarcinoma of the right lung followed by [name] started on chemotherapy 06/17/2019, hyperlipidemia, patient quit smoking 4 months ago.  Per chart review patient lives with spouse.  1 level home 2 steps to entry.  Independent and retired.  Presented 07/02/2013 with left side weakness, right gaze deviation.  Cranial CT/CTA positive for emergent large vessel occlusion with proximal right M2-M3 occlusion.  No TPA as patient was on Xarelto.  Patient underwent cerebral angiogram followed by revascularization of occluded MCA dominant mid division with x2 passes also partial revascularization of occluded ACA A2-A3 region per interventional radiology.  Patient did require ventilatory support for a short time.  Follow-up MRI after revascularization shows multiple areas of acute infarction right parietal region and numerous punctate acute infarcts in both parietal regions left more than right.  Recent echocardiogram with ejection fraction of 18% normal systolic function.  No source of embolus.  Neurology follow-up patient initially on intravenous heparin and transitioned to Eliquis.  No plan for TEE and advised to continue Eliquis.  Patient with low-grade fever on 07/07/2019 with chest x-ray completed showing persistent infiltrate right lung and was placed on Unasyn.  Urine study negative nitrite.  Presently on a dysphagia #2 honey thick liquid diet.  Therapy evaluation completed and patient was admitted for a comprehensive rehab program.   Review of Systems   Constitutional: Positive for fever. Negative for chills.  HENT: Negative for hearing loss.   Eyes: Negative for blurred vision and double vision.  Respiratory: Negative for cough and shortness of breath.   Cardiovascular: Positive for leg swelling. Negative for chest pain and palpitations.  Gastrointestinal: Positive for constipation. Negative for heartburn, nausea and vomiting.       GERD  Genitourinary: Positive for urgency. Negative for dysuria, flank pain and hematuria.  Musculoskeletal: Positive for joint pain.  Skin: Negative for rash.  Neurological: Positive for weakness.  All other systems reviewed and are negative.       Past Medical History:  Diagnosis Date   Cancer [facility])      stage 4 adenocarcinoma right lung   GERD (gastroesophageal reflux disease)     Hyperlipidemia     Stroke ([place])      25-30 emboli seen on screening MRI        Past Surgical History:  Procedure Laterality Date   CATARACT EXTRACTION   2016   EYE SURGERY       IR CT HEAD LTD   07/03/2019   IR PERCUTANEOUS ART THROMBECTOMY/INFUSION INTRACRANIAL INC DIAG ANGIO   07/03/2019   RADIOLOGY WITH ANESTHESIA N/A 07/03/2019    Procedure: IR WITH ANESTHESIA;  Surgeon: Luanne Bras, MD;  Location: [place];  Service: Radiology;  Laterality: N/A;   VIDEO BRONCHOSCOPY Bilateral 05/21/2019    Procedure: VIDEO BRONCHOSCOPY WITH FLUORO;  Surgeon: Collene Gobble, MD;  Location: [facility] ENDOSCOPY;  Service: Cardiopulmonary;  Laterality: Bilateral;   No family history on file. Social History:  reports that he quit smoking about 5 months ago. His smoking use included cigars. He has  never used smokeless tobacco. He reports current alcohol use of about 4.0 - 7.0 standard drinks of alcohol per week. He reports that he does not use drugs. Allergies: No Known Allergies Medications Prior to Admission  Medication Sig Dispense Refill   acetaminophen (TYLENOL) 500 MG tablet Take 1,000 mg by mouth every 6 (six) hours as  needed for headache.       Docusate Sodium 100 MG capsule Take 100 mg by mouth 2 (two) times daily.       folic acid (FOLVITE) 1 MG tablet Take 1 tablet (1 mg total) by mouth daily. 30 tablet 4   HYDROcodone-homatropine (HYCODAN) 5-1.5 MG/5ML syrup Take 5 mLs by mouth every 6 (six) hours as needed for cough. 120 mL 0   Multiple Vitamins-Minerals (MULTIVITAMIN WITH MINERALS) tablet Take 1 tablet by mouth daily.        Multiple Vitamins-Minerals (PRESERVISION AREDS 2) CAPS Take 1 capsule by mouth 2 (two) times daily.       omeprazole (PRILOSEC) 40 MG capsule Take 1 capsule (40 mg total) by mouth daily. 90 capsule 3   Polyvinyl Alcohol-Povidone (REFRESH OP) Place 1 drop into both eyes every evening.       pravastatin (PRAVACHOL) 20 MG tablet TAKE 1 TABLET DAILY AT 6 PM (Patient taking differently: Take 20 mg by mouth daily at 6 PM. ) 90 tablet 1   prochlorperazine (COMPAZINE) 10 MG tablet Take 1 tablet (10 mg total) by mouth every 6 (six) hours as needed for nausea or vomiting. 30 tablet 0   Rivaroxaban 15 & 20 MG TBPK Take as directed on package: Start with one 15mg  tablet by mouth twice a day with food. On Day 22, switch to one 20mg  tablet once a day with food. (Patient taking differently: Take 15-20 mg by mouth See admin instructions. Take as directed on package: Start with one 15mg  tablet by mouth twice a day with food. On Day 22, switch to one 20mg  tablet once a day with food.) 51 each 0     Drug Regimen Review Drug regimen was reviewed and remains appropriate with no significant issues identified   Home: Home Living Family/patient expects to be discharged to:: Inpatient rehab Living Arrangements: Spouse/significant other Available Help at Discharge: Family Type of Home: House Home Access: Stairs to enter Technical brewer of Steps: 2 Entrance Stairs-Rails: Right Home Layout: One level  Lives With: Spouse   Functional History: Prior Function Level of Independence:  Independent Comments: Retired.   Functional Status:  Mobility: Bed Mobility Overal bed mobility: Needs Assistance Bed Mobility: Rolling, Supine to Sit, Sit to Supine Rolling: Max assist, +2 for safety/equipment Supine to sit: Max assist, +2 for physical assistance Sit to supine: Max assist, +2 for safety/equipment General bed mobility comments: Hand over hand to facilitate reaching for bed rail, pt requiring increased time and cues due to decreased sustained attention to task and Lt inattention Transfers Overall transfer level: Needs assistance Transfers: Lateral/Scoot Transfers Sit to Stand: (partial stand to place new alarm pad under him) Squat pivot transfers: +2 physical assistance, Max assist  Lateral/Scoot Transfers: +2 physical assistance, Max assist General transfer comment: unsafe to complete transfer OOB this session due to increase in pushing, therefore engaged in lateral scoots x2 along EOB with partial sit > stand +2 Ambulation/Gait General Gait Details: Unable     ADL: ADL Overall ADL's : Needs assistance/impaired Eating/Feeding: Cueing for safety, Minimal assistance Eating/Feeding Details (indicate cue type and reason): cues for chin tuck Grooming: Wash/dry  face, Minimal assistance Grooming Details (indicate cue type and reason): using [place] on his own Upper Body Bathing: Moderate assistance, Bed level Lower Body Bathing: Total assistance, Bed level Upper Body Dressing : Maximal assistance, Bed level Lower Body Dressing: Total assistance, Bed level Toilet Transfer: Moderate assistance, +2 for physical assistance Toilet Transfer Details (indicate cue type and reason): lateral scoot from bed to drop arm recliner going to pt's right Toileting- Clothing Manipulation and Hygiene: Total assistance Toileting - Clothing Manipulation Details (indicate cue type and reason): Max A partial stand Functional mobility during ADLs: +2 for physical assistance, +2 for  safety/equipment General ADL Comments: Pt with increased pushing tendencies this session, requiring +2 for upright sitting balance.  Unsafe to complete any transfers this session, did complete lateral scoot towards HOB with max assist +2   Cognition: Cognition Overall Cognitive Status: Impaired/Different from baseline Arousal/Alertness: Awake/alert Orientation Level: Oriented X4 Attention: Sustained Sustained Attention: Impaired Sustained Attention Impairment: Verbal basic, Functional basic Memory: Impaired Memory Impairment: Decreased recall of new information Awareness: Impaired Awareness Impairment: Intellectual impairment, Emergent impairment Problem Solving: Impaired Problem Solving Impairment: Verbal basic, Functional basic Safety/Judgment: Impaired Cognition Arousal/Alertness: Awake/alert Behavior During Therapy: Flat affect Overall Cognitive Status: Impaired/Different from baseline Area of Impairment: Safety/judgement, Following commands, Awareness, Problem solving, Attention Current Attention Level: Sustained Following Commands: Follows one step commands with increased time, Follows one step commands inconsistently Safety/Judgement: Decreased awareness of deficits, Decreased awareness of safety Awareness: Emergent Problem Solving: Decreased initiation, Difficulty sequencing, Requires verbal cues, Requires tactile cues General Comments: A&Ox4. Pt with right gaze preference. Perseverating on something to drink   Physical Exam: Blood pressure 112/66, pulse 98, temperature 98.7 F (37.1 C), temperature source Oral, resp. rate 18, height 5\' 11"  (1.803 m), weight 67.3 kg, SpO2 97 %. Physical Exam  Constitutional: No distress.  HENT:  Head: Atraumatic.  Eyes: Right eye exhibits no discharge. Left eye exhibits no discharge.  Neck: Normal range of motion.  Cardiovascular: Regular rhythm. Exam reveals no gallop.  No murmur heard. Respiratory: Effort normal. No respiratory  distress. He has no wheezes.  GI: Soft. He exhibits no distension. There is no abdominal tenderness.  Neurological:   Pt arouses, dysarthric right left central 7 and left lower quadrantanopia, RUE and RLE 3+ to 4/5. LUE and LLE with dense HP. Senses pain and light touch in al 4 limbs. Decreased insight and awareness. Oriented to person, hospital, month/year  Skin: Skin is warm. He is not diaphoretic. No erythema.  Psychiatric:  Flat, slow to engage      Lab Results Last 48 Hours        Results for orders placed or performed during the hospital encounter of 07/03/19 (from the past 48 hour(s))  Urinalysis, Complete w Microscopic     Status: Abnormal    Collection Time: 07/07/19  3:46 PM  Result Value Ref Range    Color, Urine YELLOW YELLOW    APPearance HAZY (A) CLEAR    Specific Gravity, Urine 1.018 1.005 - 1.030    pH 7.0 5.0 - 8.0    Glucose, UA NEGATIVE NEGATIVE mg/dL    Hgb urine dipstick NEGATIVE NEGATIVE    Bilirubin Urine NEGATIVE NEGATIVE    Ketones, ur NEGATIVE NEGATIVE mg/dL    Protein, ur NEGATIVE NEGATIVE mg/dL    Nitrite NEGATIVE NEGATIVE    Leukocytes,Ua NEGATIVE NEGATIVE    RBC / HPF 0-5 0 - 5 RBC/hpf    WBC, UA 0-5 0 - 5 WBC/hpf    Bacteria, UA  NONE SEEN NONE SEEN    Squamous Epithelial / LPF 0-5 0 - 5    Mucus PRESENT        Comment: Performed at [facility], [place] [address] [place], [place] [address]  Basic metabolic panel     Status: Abnormal    Collection Time: 07/08/19  6:38 AM  Result Value Ref Range    Sodium 133 (L) 135 - 145 mmol/L    Potassium 4.1 3.5 - 5.1 mmol/L    Chloride 102 98 - 111 mmol/L    CO2 23 22 - 32 mmol/L    Glucose, Bld 105 (H) 70 - 99 mg/dL    BUN 13 8 - 23 mg/dL    Creatinine, Ser 0.77 0.61 - 1.24 mg/dL    Calcium 7.7 (L) 8.9 - 10.3 mg/dL    GFR calc non Af Amer >60 >60 mL/min    GFR calc Af Amer >60 >60 mL/min    Anion gap 8 5 - 15      Comment: Performed at [facility], [place] [address], [place], [place] [address]   CBC     Status: Abnormal    Collection Time: 07/08/19  6:38 AM  Result Value Ref Range    WBC 4.8 4.0 - 10.5 K/uL    RBC 2.97 (L) 4.22 - 5.81 MIL/uL    Hemoglobin 8.0 (L) 13.0 - 17.0 g/dL    HCT 25.3 (L) 39.0 - 52.0 %    MCV 85.2 80.0 - 100.0 fL    MCH 26.9 26.0 - 34.0 pg    MCHC 31.6 30.0 - 36.0 g/dL    RDW 16.1 (H) 11.5 - 15.5 %    Platelets 151 150 - 400 K/uL    nRBC 0.0 0.0 - 0.2 %      Comment: Performed at [facility], [place] [address], [place], [place] [address]  CBC     Status: Abnormal    Collection Time: 07/09/19  4:38 AM  Result Value Ref Range    WBC 4.9 4.0 - 10.5 K/uL    RBC 2.89 (L) 4.22 - 5.81 MIL/uL    Hemoglobin 7.8 (L) 13.0 - 17.0 g/dL    HCT 25.0 (L) 39.0 - 52.0 %    MCV 86.5 80.0 - 100.0 fL    MCH 27.0 26.0 - 34.0 pg    MCHC 31.2 30.0 - 36.0 g/dL    RDW 15.9 (H) 11.5 - 15.5 %    Platelets 138 (L) 150 - 400 K/uL    nRBC 0.0 0.0 - 0.2 %      Comment: Performed at [facility], [place] [address] [place], [place] [address]  Basic metabolic panel     Status: Abnormal    Collection Time: 07/09/19  4:38 AM  Result Value Ref Range    Sodium 134 (L) 135 - 145 mmol/L    Potassium 3.8 3.5 - 5.1 mmol/L    Chloride 104 98 - 111 mmol/L    CO2 22 22 - 32 mmol/L    Glucose, Bld 117 (H) 70 - 99 mg/dL    BUN 16 8 - 23 mg/dL    Creatinine, Ser 0.85 0.61 - 1.24 mg/dL    Calcium 7.7 (L) 8.9 - 10.3 mg/dL    GFR calc non Af Amer >60 >60 mL/min    GFR calc Af Amer >60 >60 mL/min    Anion gap 8 5 - 15      Comment:  Performed at [facility], [place] [address] [place], [place] [address]      Imaging Results (Last 48 hours)  Dg Chest Port 1 View   Result Date: 07/07/2019 CLINICAL DATA:  Cough.  History of stroke. EXAM: PORTABLE CHEST 1 VIEW COMPARISON:  July 03, 2019 FINDINGS: The ET tube is been removed. No pneumothorax. Persistent infiltrate in the right mid to lower lung. No other interval changes. IMPRESSION: Removal of ET tube. Persistent infiltrate in  the right lung. Recommend follow-up to resolution. Electronically Signed   By: Dorise Bullion III M.D   On: 07/07/2019 12:33    Dg Swallowing Func-speech Pathology   Result Date: 07/08/2019 Objective Swallowing Evaluation: Type of Study: MBS-Modified Barium Swallow Study  Patient Details Name: Sylvia Kondracki MRN: [id number] Date of Birth: 1951/09/15 Today's Date: 07/08/2019 Time: SLP Start Time (ACUTE ONLY): 1194 -SLP Stop Time (ACUTE ONLY): 1740 SLP Time Calculation (min) (ACUTE ONLY): 20 min Past Medical History: Past Medical History: Diagnosis Date  Cancer ([place])   stage 4 adenocarcinoma right lung  GERD (gastroesophageal reflux disease)   Hyperlipidemia   Stroke ([place])   25-30 emboli seen on screening MRI Past Surgical History: Past Surgical History: Procedure Laterality Date  CATARACT EXTRACTION  2016  EYE SURGERY    IR CT HEAD LTD  07/03/2019  IR PERCUTANEOUS ART THROMBECTOMY/INFUSION INTRACRANIAL INC DIAG ANGIO  07/03/2019  RADIOLOGY WITH ANESTHESIA N/A 07/03/2019  Procedure: IR WITH ANESTHESIA;  Surgeon: Luanne Bras, MD;  Location: [place];  Service: Radiology;  Laterality: N/A;  VIDEO BRONCHOSCOPY Bilateral 05/21/2019  Procedure: VIDEO BRONCHOSCOPY WITH FLUORO;  Surgeon: Collene Gobble, MD;  Location: [facility] ENDOSCOPY;  Service: Cardiopulmonary;  Laterality: Bilateral; HPI: 68yo M with history of R DVT on Xarelto, adenocarcinoma of the R lung, prior strokes on imaging, HLD, and GERD, who p/w left-sided hemiplegia and R gaze deviation. Found to have R MCA and ACA occlusions. Now s/p mechanical thrombectomy with VIR on 7/10. MBS 7/12 recommended nectar with chin tuck. Pt has exhibited clinical signs of aspiration with nectar thick using chin tuck. Repeat MBS warranted.    Subjective: Pt awake, alert, pleasant, participative Assessment / Plan / Recommendation CHL IP CLINICAL IMPRESSIONS 07/08/2019 Clinical Impression Pt's repeat MBS today was similar to prior on 7/12 with possibly slight improvements in  motor strength. He continues to aspirate thin liquids and penetrate nectar with head in neutral. Today laryngeal elevation and closure appeared adequate but timing and coordination and closure was suboptimal. Thin and nectar (with straw) boluses entered laryngeal vestibule prior to complete epiglottic deflection in addition to large volume with nectar. Verbal cues for smaller sips using straw were safe. He was hypersensitive to even small amount of penetration and produced strong reflexive cough to clear penetrates. There was mild-mod amounts of vallecular and pyriform sinus residue. Oral phase marked decreased cohesion with anterior spill and lingual pumping. Positioning tricky due to kyphosis/cervical rigidity and head in mostly static flexed and right rotation. He was mildy more lethargic today and has been febrile for 3 days, therefore will continue honey thick liquids/Dys 2 texture and ST intervention for diet modification (likely return to nectar with clinical observation only) and trial of RMT (respiratory muscle strength training).    SLP Visit Diagnosis Dysphagia, oropharyngeal phase (R13.12) Attention and concentration deficit following -- Frontal lobe and executive function deficit following -- Impact on safety and function Moderate aspiration risk   CHL IP TREATMENT RECOMMENDATION 07/08/2019 Treatment Recommendations Therapy as outlined in treatment plan  below   Prognosis 07/08/2019 Prognosis for Safe Diet Advancement Good Barriers to Reach Goals Cognitive deficits;Severity of deficits Barriers/Prognosis Comment -- CHL IP DIET RECOMMENDATION 07/08/2019 SLP Diet Recommendations Honey thick liquids;Dysphagia 2 (Fine chop) solids Liquid Administration via Cup;No straw Medication Administration Crushed with puree Compensations Small sips/bites;Slow rate Postural Changes Seated upright at 90 degrees   CHL IP OTHER RECOMMENDATIONS 07/08/2019 Recommended Consults -- Oral Care Recommendations Oral care BID Other  Recommendations --   CHL IP FOLLOW UP RECOMMENDATIONS 07/08/2019 Follow up Recommendations Inpatient Rehab   CHL IP FREQUENCY AND DURATION 07/08/2019 Speech Therapy Frequency (ACUTE ONLY) min 2x/week Treatment Duration 2 weeks      CHL IP ORAL PHASE 07/08/2019 Oral Phase Impaired Oral - Pudding Teaspoon -- Oral - Pudding Cup -- Oral - Honey Teaspoon -- Oral - Honey Cup Left anterior bolus loss;Delayed oral transit;Decreased bolus cohesion;Lingual pumping Oral - Nectar Teaspoon -- Oral - Nectar Cup Left anterior bolus loss;Delayed oral transit;Decreased bolus cohesion;Lingual pumping Oral - Nectar Straw Left anterior bolus loss;Delayed oral transit;Decreased bolus cohesion Oral - Thin Teaspoon -- Oral - Thin Cup Left anterior bolus loss;Delayed oral transit;Decreased bolus cohesion;Lingual pumping Oral - Thin Straw -- Oral - Puree NT Oral - Mech Soft -- Oral - Regular Left anterior bolus loss;Delayed oral transit;Decreased bolus cohesion Oral - Multi-Consistency -- Oral - Pill -- Oral Phase - Comment --  CHL IP PHARYNGEAL PHASE 07/08/2019 Pharyngeal Phase Impaired Pharyngeal- Pudding Teaspoon -- Pharyngeal -- Pharyngeal- Pudding Cup -- Pharyngeal -- Pharyngeal- Honey Teaspoon -- Pharyngeal -- Pharyngeal- Honey Cup Pharyngeal residue - valleculae;Pharyngeal residue - pyriform;Penetration/Aspiration during swallow Pharyngeal Material enters airway, remains ABOVE vocal cords and not ejected out Pharyngeal- Nectar Teaspoon -- Pharyngeal -- Pharyngeal- Nectar Cup Penetration/Aspiration during swallow Pharyngeal Material enters airway, CONTACTS cords and then ejected out Pharyngeal- Nectar Straw Penetration/Aspiration during swallow;Pharyngeal residue - pyriform;Pharyngeal residue - valleculae Pharyngeal Material enters airway, remains ABOVE vocal cords and not ejected out Pharyngeal- Thin Teaspoon -- Pharyngeal -- Pharyngeal- Thin Cup Penetration/Aspiration during swallow Pharyngeal Material enters airway, passes BELOW cords  and not ejected out despite cough attempt by patient Pharyngeal- Thin Straw NT Pharyngeal -- Pharyngeal- Puree NT Pharyngeal -- Pharyngeal- Mechanical Soft -- Pharyngeal -- Pharyngeal- Regular WFL Pharyngeal -- Pharyngeal- Multi-consistency -- Pharyngeal -- Pharyngeal- Pill -- Pharyngeal -- Pharyngeal Comment --  CHL IP CERVICAL ESOPHAGEAL PHASE 07/08/2019 Cervical Esophageal Phase WFL Pudding Teaspoon -- Pudding Cup -- Honey Teaspoon -- Honey Cup -- Nectar Teaspoon -- Nectar Cup -- Nectar Straw -- Thin Teaspoon -- Thin Cup -- Thin Straw -- Puree -- Mechanical Soft -- Regular -- Multi-consistency -- Pill -- Cervical Esophageal Comment -- Houston Siren 07/08/2019, 3:15 PM Orbie Pyo Litaker M.Ed Risk analyst [phone] Office [phone]                       Medical Problem List and Plan: 1.  Dense left hemiparesis with dysphagia secondary to embolic shower right MCA occlusion status post reperfusion.  Infarct felt to be embolic secondary to hypercoagulable state from lung cancer             -admit to inpatient rehab 2.  Antithrombotics: -DVT/anticoagulation/history of right lower extremity DVT: Eliquis             -antiplatelet therapy: N/A 3. Pain Management: Tylenol as needed 4. Mood: Provide emotional support             -antipsychotic agents: N/A 5. Neuropsych: This patient is  capable of making decisions on his own behalf. 6. Skin/Wound Care: Routine skin checks 7. Fluids/Electrolytes/Nutrition: Routine in and outs with follow-up chemistries 8.  Stage IV adenocarcinoma right lung.  Chemotherapy as per [name] 9.  Aspiration pneumonia.  Continue Unasyn initiated 7/15/ 2020 x 5-day course.              -aspiration precautions 10.  Dysphagia.  Dysphasia #2 honey thick liquids.  Follow-up speech therapy. Advance diet as possible. 11.  Hyperlipidemia.  Crestor 12.  Remote tobacco abuse.  Counseling 13.  Acute on chronic anemia.  Follow-up CBC   Post  Admission Physician Evaluation: 1. Functional deficits secondary  to right MCA infarct. 2. Patient is admitted to receive collaborative, interdisciplinary care between the physiatrist, rehab nursing staff, and therapy team. 3. Patient's level of medical complexity and substantial therapy needs in context of that medical necessity cannot be provided at a lesser intensity of care such as a SNF. 4. Patient has experienced substantial functional loss from his/her baseline which was documented above under the "Functional History" and "Functional Status" headings.  Judging by the patient's diagnosis, physical exam, and functional history, the patient has potential for functional progress which will result in measurable gains while on inpatient rehab.  These gains will be of substantial and practical use upon discharge  in facilitating mobility and self-care at the household level. 5. Physiatrist will provide 24 hour management of medical needs as well as oversight of the therapy plan/treatment and provide guidance as appropriate regarding the interaction of the two. 6. The Preadmission Screening has been reviewed and patient status is unchanged unless otherwise stated above. 7. 24 hour rehab nursing will assist with bladder management, bowel management, safety, skin/wound care, disease management, medication administration, pain management and patient education  and help integrate therapy concepts, techniques,education, etc. 8. PT will assess and treat for/with: Lower extremity strength, range of motion, stamina, balance, functional mobility, safety, adaptive techniques and equipment .   Goals are: min assist. 9. OT will assess and treat for/with: ADL's, functional mobility, safety, upper extremity strength, adaptive techniques and equipment .   Goals are: min assist. Therapy may proceed with showering this patient. 10. SLP will assess and treat for/with: cognition, swallowing, communication.  Goals are:  supervision. 11. Case Management and Social Worker will assess and treat for psychological issues and discharge planning. 12. Team conference will be held weekly to assess progress toward goals and to determine barriers to discharge. 13. Patient will receive at least 3 hours of therapy per day at least 5 days per week. 14. ELOS: 14-20 days       15. Prognosis:  excellent   I have personally performed a face to face diagnostic evaluation of this patient and formulated the key components of the plan.  Additionally, I have personally reviewed laboratory data, imaging studies, as well as relevant notes and concur with the physician assistant's documentation above.  [name], MD, FAAPMR     Lavon Paganini [place], PA-C 07/09/2019

## 2019-07-10 NOTE — Progress Notes (Signed)
Patient ID: Kyle Garcia, male   DOB: [birth year], 68 y.o.   MRN: [id number]  ADMIT TO REHAB, ORIENTED TO UNIT, MEDICATIONS, REHAB SCHEDULE AND PLAN OF CARE. STATES AN UNDERSTANDING OF INFORMATION REVIEWED. Jodie Cavey R

## 2019-07-10 NOTE — Progress Notes (Addendum)
Kyle Staggers, MD  Physician  Physical Medicine and Rehabilitation  PMR Pre-admission  Signed  Date of Service:  07/10/2019 11:40 AM      Related encounter: ED to Hosp-Admission (Current) from 07/03/2019 in [facility]      Signed         Show:Clear all [x]Manual[x]Template[x]Copied  Added by: [x]Boyette, Vertis Kelch, RN[x]Swartz, Celesta Gentile, MD  []Hover for details PMR Admission Coordinator Pre-Admission Assessment  Patient: Kyle Garcia is an 68 y.o., male MRN: [id number] DOB: [birth year] Height: 5' 11" (180.3 cm) Weight: 67.3 kg  Insurance Information HMO:     PPO:      PCP:      IPA:      80/20:      OTHER: no HMO PRIMARY: Medicare a and b      Policy#: [id number]      Subscriber: pt Benefits:  Phone #: passport one online     Name: 7/13 Eff. Date: 12/24/2016     Deduct: [phone]      Out of Pocket Max: NONE      Life Max: NONE CIR: 100%      SNF: 20 full days Outpatient: 80%     Co-Pay: 20% Home Health: 100%      Co-Pay: none DME: 80%     Co-Pay: 205 Providers: pt choice  SECONDARY: [facility] [employer]      Policy#: [id number]      Subscriber: pt  Medicaid Application Date:       Case Manager:  Disability Application Date:       Case Worker:   The Data Collection Information Summary for patients in Inpatient Rehabilitation Facilities with attached Privacy Act [place] Records was provided and verbally reviewed with: Family  Emergency Contact Information         Contact Information    Name Relation Home Work Mobile   Tat,JANET Spouse [phone]  [phone]      Current Medical History  Patient Admitting Diagnosis: CVA  History of Present Illness: Kyle Garcia a 68 year old right-handed male with history of recent right lower extremityDVTconfirmed by Doppler 4/22/2020maintained on Xareltoas well as multiple embolic cortical and subcortical bilateral infarcts identified on MRI from most recent  admission in June 2020 during work-up of DVT,chronic anemia,stage IV adenocarcinoma of the right lung followed by [name] started on chemotherapy 06/17/2019, hyperlipidemia, patient quit smoking 4 months ago. Presented 07/02/2013 with left side weakness, right gaze deviation. Cranial CT/CTApositive for emergent large vessel occlusion with proximal right M2-M3 occlusion. No TPA as patient was on Xarelto. Patient underwent cerebral angiogram followed by revascularization of occluded MCA dominant mid division with x2 passes also partial revascularization of occluded ACA A2-A3 region per interventional radiology. Patient did require ventilatory support for a short time. Follow-up MRI after revascularization shows multiple areas of acute infarction right parietal region and numerous punctate acute infarcts in both parietal regions left more than right. Recent echocardiogram with ejection fraction of 97% normal systolic function. No source of embolus. Neurology follow-up patient initially on intravenous heparin and transitioned to Eliquis. No plan for TEE and advised to continue Eliquis. Patient with low-grade fever on 07/07/2019 with chest x-ray completed showing persistent infiltrate right lungand was placed on Unasyn. Urine study negative nitrite. Presently on a dysphagia #2 honey thick liquid diet.   Complete NIHSS TOTAL: 9  Patient's [facility]  has been reviewed by the rehabilitation admission coordinator and physician.  Past Medical  History      Past Medical History:  Diagnosis Date   Cancer ([place])    stage 4 adenocarcinoma right lung   GERD (gastroesophageal reflux disease)    Hyperlipidemia    Stroke ([place])    25-30 emboli seen on screening MRI    Family History   family history is not on file.  Prior Rehab/Hospitalizations Has the patient had prior rehab or hospitalizations prior to admission? Yes  Has the patient had major  surgery during 100 days prior to admission? yes              Current Medications  Current Facility-Administered Medications:     stroke: mapping our early stages of recovery book, , Does not apply, Once, Aroor, Karena Addison R, MD   0.9 %  sodium chloride infusion, 250 mL, Intravenous, Continuous, Rosalin Hawking, MD, Last Rate: 40 mL/hr at 07/10/19 0500   acetaminophen (TYLENOL) tablet 650 mg, 650 mg, Oral, Q4H PRN, 650 mg at 07/08/19 1426 **OR** acetaminophen (TYLENOL) solution 650 mg, 650 mg, Per Tube, Q4H PRN, 650 mg at 07/09/19 0029 **OR** [DISCONTINUED] acetaminophen (TYLENOL) suppository 650 mg, 650 mg, Rectal, Q4H PRN, Deveshwar, Sanjeev, MD   Ampicillin-Sulbactam (UNASYN) 3 g in sodium chloride 0.9 % 100 mL IVPB, 3 g, Intravenous, Q6H, Rosalin Hawking, MD, Last Rate: 200 mL/hr at 07/10/19 0409, 3 g at 07/10/19 0409   apixaban (ELIQUIS) tablet 5 mg, 5 mg, Oral, BID, Dang, Thuy D, RPH, 5 mg at 07/09/19 2142   chlorhexidine (PERIDEX) 0.12 % solution 15 mL, 15 mL, Mouth Rinse, BID, Rosalin Hawking, MD, 15 mL at 07/09/19 2142   feeding supplement (ENSURE ENLIVE) (ENSURE ENLIVE) liquid 237 mL, 237 mL, Oral, BID BM, Rosalin Hawking, MD, 237 mL at 30/07/62 2633   folic acid (FOLVITE) tablet 1 mg, 1 mg, Per Tube, Daily, Antony Contras S, MD, 1 mg at 07/09/19 1047   ketorolac (TORADOL) 15 MG/ML injection 15 mg, 15 mg, Intravenous, Q6H, Kerney Elbe, MD, 15 mg at 07/10/19 0636   multivitamin with minerals tablet 1 tablet, 1 tablet, Per Tube, Daily, Garvin Fila, MD, 1 tablet at 07/09/19 1047   Resource ThickenUp Clear, , Oral, PRN, Rosalin Hawking, MD   rosuvastatin (CRESTOR) tablet 20 mg, 20 mg, Oral, q1800, Kipp Brood, MD, 20 mg at 07/09/19 1738  Patients Current Diet:     Diet Order                  DIET DYS 2 Room service appropriate? Yes; Fluid consistency: Honey Thick  Diet effective now               Precautions / Restrictions Precautions Precautions: Fall Precaution  Comments: pushes hard to L  Restrictions Weight Bearing Restrictions: No   Has the patient had 2 or more falls or a fall with injury in the past year? No  Prior Activity Level Community (5-7x/wk): Had not driven in 1 month pta due to excessive coughing spells per wife  Prior Functional Level Self Care: Did the patient need help bathing, dressing, using the toilet or eating? Independent  Indoor Mobility: Did the patient need assistance with walking from room to room (with or without device)? Independent  Stairs: Did the patient need assistance with internal or external stairs (with or without device)? Independent  Functional Cognition: Did the patient need help planning regular tasks such as shopping or remembering to take medications? Independent  Home Assistive Devices / Equipment Home Assistive Devices/Equipment: None  Prior Device Use:  Indicate devices/aids used by the patient prior to current illness, exacerbation or injury? None of the above  Current Functional Level Cognition  Arousal/Alertness: Awake/alert Overall Cognitive Status: Impaired/Different from baseline Current Attention Level: Sustained Orientation Level: Oriented X4 Following Commands: Follows one step commands with increased time, Follows one step commands inconsistently Safety/Judgement: Decreased awareness of deficits, Decreased awareness of safety General Comments: pt fixated on getting "something to drink" Attention: Sustained Sustained Attention: Impaired Sustained Attention Impairment: Verbal basic, Functional basic Memory: Impaired Memory Impairment: Decreased recall of new information Awareness: Impaired Awareness Impairment: Intellectual impairment, Emergent impairment Problem Solving: Impaired Problem Solving Impairment: Verbal basic, Functional basic Safety/Judgment: Impaired    Extremity Assessment (includes Sensation/Coordination)  Upper Extremity Assessment: Defer to OT  evaluation, LUE deficits/detail LUE Deficits / Details: No active movement LUE. Increased intermittent tone noted in LUE, noted some edema in hand LUE Sensation: decreased light touch(decreased pinch) LUE Coordination: decreased fine motor, decreased gross motor  Lower Extremity Assessment: LLE deficits/detail, RLE deficits/detail RLE Deficits / Details: Swelling present throughout esp in lower calf likely due to DVT. LLE Deficits / Details: No active movement throughout. Some trace in hip flexors. Sustained clonus at ankle. No response to pressure, noxious stimulus. Able to feel cold hands. LLE Sensation: decreased light touch, decreased proprioception LLE Coordination: decreased fine motor, decreased gross motor    ADLs  Overall ADL's : Needs assistance/impaired Eating/Feeding: Cueing for safety, Minimal assistance Eating/Feeding Details (indicate cue type and reason): cues for chin tuck Grooming: Wash/dry face, Minimal assistance Grooming Details (indicate cue type and reason): using [place] on his own Upper Body Bathing: Moderate assistance, Bed level Lower Body Bathing: Total assistance, Bed level Upper Body Dressing : Maximal assistance, Bed level Lower Body Dressing: Total assistance, Bed level Toilet Transfer: Moderate assistance, +2 for physical assistance Toilet Transfer Details (indicate cue type and reason): lateral scoot from bed to drop arm recliner going to pt's right Toileting- Clothing Manipulation and Hygiene: Total assistance Toileting - Clothing Manipulation Details (indicate cue type and reason): Max A partial stand Functional mobility during ADLs: +2 for physical assistance, +2 for safety/equipment General ADL Comments: Pt with increased pushing tendencies this session, requiring +2 for upright sitting balance.  Unsafe to complete any transfers this session, did complete lateral scoot towards HOB with max assist +2    Mobility  Overal bed mobility: Needs  Assistance Bed Mobility: Rolling, Sidelying to Sit, Sit to Sidelying Rolling: Max assist, +2 for safety/equipment Sidelying to sit: Max assist, +2 for physical assistance Supine to sit: Max assist, +2 for physical assistance Sit to supine: Max assist, +2 for safety/equipment Sit to sidelying: Total assist, +2 for physical assistance General bed mobility comments: vc for sequencing, multimodal cues and assist for R knee flexion, and hand over hand assist for reaching to L bed rail; assist at hips and trunk to come into sitting     Transfers  Overall transfer level: Needs assistance Transfers: Lateral/Scoot Transfers Sit to Stand: (partial stand to place new alarm pad under him) Squat pivot transfers: +2 physical assistance, Max assist  Lateral/Scoot Transfers: +2 physical assistance, Max assist General transfer comment: unsafe to complete transfer OOB this session due to increase in pushing, therefore engaged in lateral scoots x2 along EOB with partial sit > stand +2    Ambulation / Gait / Stairs / Wheelchair Mobility  Ambulation/Gait General Gait Details: Unable    Posture / Balance Dynamic Sitting Balance Sitting balance - Comments: max A required to maintain sitting balance  EOB with positioning of R UE to decrease pushing; worked on midline posture with therapist seated beside pt and briefly pt able to maintain sitting balance with mod A  Balance Overall balance assessment: Needs assistance Sitting-balance support: Single extremity supported, Feet supported Sitting balance-Leahy Scale: Poor(zero to poor) Sitting balance - Comments: max A required to maintain sitting balance EOB with positioning of R UE to decrease pushing; worked on midline posture with therapist seated beside pt and briefly pt able to maintain sitting balance with mod A  Postural control: Left lateral lean(pushing to L with R UE) Standing balance support: During functional activity Standing balance-Leahy Scale:  Zero Standing balance comment: Max A of 2 for standing    Special needs/care consideration BiPAP/CPAP n/a CPM  N/a Continuous Drip IV  N/a Dialysis n/a Life Vest  N/a Oxygen  N/a Special Bed  N/a Trach Size  N/a Wound Vac n/a Skin intact Bowel mgmt:  Incontinent LBM 7/16 Bladder mgmt: external catheter Diabetic mgmt: Hgb A1c 5.7 Behavioral consideration  N/a Chemo/radiation yes has just completed first round of chem for new dx of Lung ca   Previous Home Environment  Living Arrangements: Spouse/significant other  Lives With: Spouse Available Help at Discharge: Family, Available 24 hours/day Type of Home: House Home Layout: One level Home Access: (through garage) Entrance Stairs-Rails: None Entrance Stairs-Number of Steps: 2 Bathroom Shower/Tub: Multimedia programmer: Standard Bathroom Accessibility: Yes How Accessible: Accessible via walker Home Care Services: No  Discharge Living Setting Plans for Discharge Living Setting: Patient's home, Lives with (comment)(wife) Type of Home at Discharge: House Discharge Home Layout: One level Discharge Home Access: Stairs to enter Entrance Stairs-Rails: None(entry through garage) Technical brewer of Steps: 2 Discharge Bathroom Shower/Tub: Horticulturist, commercial: Standard Discharge Bathroom Accessibility: Yes How Accessible: Accessible via walker Does the patient have any problems obtaining your medications?: No  Social/Family/Support Systems Patient Roles: Spouse, Parent Contact Information: wife, [name] Anticipated Caregiver: wife and adult local children, has son and daughter local Anticipated Caregiver's Contact Information: see above Ability/Limitations of Caregiver: wife is petite, children local and live close by, one daughter [name] is out of town Building control surveyor Availability: 24/7 Discharge Plan Discussed with Primary Caregiver: Yes Is Caregiver In Agreement with Plan?: Yes Does  Caregiver/Family have Issues with Lodging/Transportation while Pt is in Rehab?: No  Goals/Additional Needs Patient/Family Goal for Rehab: min with PTand OT, supervision with SLP Expected length of stay: ELOS 14 to 20 days Pt/Family Agrees to Admission and willing to participate: Yes Program Orientation Provided & Reviewed with Pt/Caregiver Including Roles  & Responsibilities: Yes  Decrease burden of Care through IP rehab admission: n/a  Possible need for SNF placement upon discharge: not anticipated  Patient Condition: I have reviewed medical records from [facility], spoken with CM, and patient, spouse and daughter. I met with patient at the bedside for inpatient rehabilitation assessment.  Patient will benefit from ongoing PT, can actively participate in 3 hours of therapy a day 5 days of the week, and can make measurable gains during the admission.  Patient will also benefit from the coordinated team approach during an Inpatient Acute Rehabilitation admission.  The patient will receive intensive therapy as well as Rehabilitation physician, nursing, social worker, and care management interventions.  Due to bladder management, bowel management, safety, skin/wound care, disease management, medication administration, pain management and patient education the patient requires 24 hour a day rehabilitation nursing.  The patient is currently mod to max with mobility and  basic ADLs.  Discharge setting and therapy post discharge at home with home health is anticipated.  Patient has agreed to participate in the Acute Inpatient Rehabilitation Program and will admit today.  Preadmission Screen Completed By:  Cleatrice Burke RN MSN, 07/10/2019 11:40 AM ______________________________________________________________________   Discussed status with Dr. Naaman Plummer  on  07/10/2019 at  59 and received approval for admission today.  Admission Coordinator:  Cleatrice Burke, RN, time  8299  Date  07/10/2019   Assessment/Plan: Diagnosis: embolic right MCA infarct 1. Does the need for close, 24 hr/day Medical supervision in concert with the patient's rehab needs make it unreasonable for this patient to be served in a less intensive setting? Yes and Potentially 2. Co-Morbidities requiring supervision/potential complications: DVT, prior CVA, lung cancer 3. Due to bladder management, bowel management, safety, skin/wound care, disease management, medication administration, pain management and patient education, does the patient require 24 hr/day rehab nursing? Yes 4. Does the patient require coordinated care of a physician, rehab nurse, PT (1-2 hrs/day, 5 days/week), OT (1-2 hrs/day, 5 days/week) and SLP (1-2 hrs/day, 5 days/week) to address physical and functional deficits in the context of the above medical diagnosis(es)? Yes Addressing deficits in the following areas: balance, endurance, locomotion, strength, transferring, bowel/bladder control, bathing, dressing, feeding, grooming, toileting, cognition, speech and psychosocial support 5. Can the patient actively participate in an intensive therapy program of at least 3 hrs of therapy 5 days a week? Yes 6. The potential for patient to make measurable gains while on inpatient rehab is excellent 7. Anticipated functional outcomes upon discharge from inpatients are: min assist PT, min assist OT, supervision SLP 8. Estimated rehab length of stay to reach the above functional goals is: 14-20 days 9. Anticipated D/C setting: Home 10. Anticipated post D/C treatments: [place] therapy 11. Overall Rehab/Functional Prognosis: excellent  MD Signature: Kyle Staggers, MD, [place] [facility] 07/10/2019         Revision History

## 2019-07-10 NOTE — Progress Notes (Signed)
Inpatient Rehabilitation Admissions Coordinator  I met with patient at bedside with SLP. Pt awake and interactive. I contacted Dr. Erlinda Hong to clarify if pt medically ready to admit to CIR today. He is in agreement and will prepare pt for d/c to CIR today. I contacted pt's wife by phone and she is aware and in agreement. I will make the arrangements to admit today.   Danne Baxter, RN, MSN Rehab Admissions Coordinator [phone] 07/10/2019 10:48 AM

## 2019-07-10 NOTE — Care Management Important Message (Signed)
Important Message  Patient Details  Name: Kyle Garcia MRN: [id number] Date of Birth: 05/17/1951   Medicare Important Message Given:  Yes     Shelda Altes 07/10/2019, 3:20 PM

## 2019-07-11 ENCOUNTER — Inpatient Hospital Stay (HOSPITAL_COMMUNITY): Payer: Medicare Other | Admitting: Occupational Therapy

## 2019-07-11 ENCOUNTER — Inpatient Hospital Stay (HOSPITAL_COMMUNITY): Payer: Medicare Other | Admitting: Speech Pathology

## 2019-07-11 ENCOUNTER — Other Ambulatory Visit: Payer: Self-pay

## 2019-07-11 ENCOUNTER — Inpatient Hospital Stay (HOSPITAL_COMMUNITY): Payer: Medicare Other

## 2019-07-11 DIAGNOSIS — G8194 Hemiplegia, unspecified affecting left nondominant side: Secondary | ICD-10-CM

## 2019-07-11 NOTE — Progress Notes (Signed)
[place] PHYSICAL MEDICINE & REHABILITATION PROGRESS NOTE   Subjective/Complaints:  Asleep but awakens to voice.  ROS- cannot obtain due to lethargy   Objective:   Dg Chest Port 1 View  Result Date: 07/09/2019 CLINICAL DATA:  Fever. Recent RIGHT MIDDLE cerebral artery stroke. Follow-up RIGHT lung pneumonia. EXAM: PORTABLE CHEST 1 VIEW COMPARISON:  07/07/2019 and earlier, including CTA chest 06/20/2019. FINDINGS: Cardiac silhouette normal in size, unchanged. Atherosclerotic. Hilar and mediastinal contours otherwise unremarkable. Airspace consolidation in the RIGHT mid lung, unchanged since the examination 2 days ago but improved since the examination 6 days ago. No new pulmonary parenchymal abnormalities. IMPRESSION: 1. Stable pneumonia in the RIGHT mid lung since the examination 2 days ago, improved since 6 days ago. 2. No new abnormalities. Electronically Signed   By: Evangeline Dakin M.D.   On: 07/09/2019 10:21   Recent Labs    07/09/19 0438 07/10/19 0555  WBC 4.9 5.9  HGB 7.8* 8.3*  HCT 25.0* 26.0*  PLT 138* 151   Recent Labs    07/09/19 0438 07/10/19 0555  NA 134* 132*  K 3.8 4.3  CL 104 104  CO2 22 20*  GLUCOSE 117* 101*  BUN 16 19  CREATININE 0.85 0.77  CALCIUM 7.7* 7.5*    Intake/Output Summary (Last 24 hours) at 07/11/2019 0853 Last data filed at 07/11/2019 0231 Gross per 24 hour  Intake 657.31 ml  Output -  Net 657.31 ml     Physical Exam: Vital Signs Blood pressure (!) 145/84, pulse (!) 113, temperature 98.3 F (36.8 C), resp. rate 18, SpO2 96 %.   General: No acute distress, lethargic with delay in response, inconsistent with commands Mood and affect are appropriate Heart: Regular rate and rhythm no rubs murmurs or extra sounds Lungs: Clear to auscultation, breathing unlabored, no rales or wheezes Abdomen: Positive bowel sounds, soft nontender to palpation, nondistended Extremities: No clubbing, cyanosis, or edema Skin: No evidence of breakdown,  no evidence of rash Neurologic: Cranial nerves II through XII intact, motor strength is 5/5 in Right and 0/5 left  deltoid, bicep, tricep, grip, hip flexor, knee extensors, ankle dorsiflexor and plantar flexor Sensory exam winces to pinch LUE and LLE  Musculoskeletal: Full range of motion in all 4 extremities. No joint swelling    Assessment/Plan: 1. Functional deficits secondary to Right MCA infarct with dysphagia  which require 3+ hours per day of interdisciplinary therapy in a comprehensive inpatient rehab setting.  Physiatrist is providing close team supervision and 24 hour management of active medical problems listed below.  Physiatrist and rehab team continue to assess barriers to discharge/monitor patient progress toward functional and medical goals  Care Tool:  Bathing              Bathing assist Assist Level: Dependent - Patient 0%     Upper Body Dressing/Undressing Upper body dressing        Upper body assist      Lower Body Dressing/Undressing Lower body dressing            Lower body assist       Toileting Toileting    Toileting assist Assist for toileting: Dependent - Patient 0%     Transfers Chair/bed transfer  Transfers assist           Locomotion Ambulation   Ambulation assist              Walk 10 feet activity   Assist  Walk 50 feet activity   Assist           Walk 150 feet activity   Assist           Walk 10 feet on uneven surface  activity   Assist           Wheelchair     Assist               Wheelchair 50 feet with 2 turns activity    Assist            Wheelchair 150 feet activity     Assist          Medical Problem List and Plan: 1.Dense lefthemiparesiswith dysphagiasecondary to embolic shower right MCA occlusion status post reperfusion. Infarct felt to be embolic secondary to hypercoagulable state from lung cancer -CIR PT, OT,   SLP evals  2. Antithrombotics: -DVT/anticoagulation/history of right lower extremity UDJ:SHFWYOV -antiplatelet therapy: N/A 3. Pain Management:Tylenol as needed 4. Mood:Provide emotional support -antipsychotic agents: N/A 5. Neuropsych: This patientiscapable of making decisions on hisown behalf. 6. Skin/Wound Care:Routine skin checks 7. Fluids/Electrolytes/Nutrition:Routine in and outs with follow-up chemistries 8.Stage IV adenocarcinoma right lung. Chemotherapy as per [name] 9. Aspiration pneumonia. Continue Unasyn initiated 07/08/2019 x 5-day course. Afebrile, WBC normal  -aspiration precautions 10. Dysphagia. Dysphasia #2 honey thick liquids. Follow-up speech therapy. Advance diet as possible. 11. Hyperlipidemia. Crestor 12. Remote tobacco abuse. Counseling 13.Acute on chronic anemia. Hgb slightly up at 8.3 will cont to monitor   14 Hypocalcemia- likely ok if corrected check  CMET , no clinical signs and unchanged from prior values  LOS: 1 days A FACE TO [place] E Vibha Ferdig 07/11/2019, 8:53 AM

## 2019-07-11 NOTE — Evaluation (Signed)
Speech Language Pathology Assessment and Plan  Patient Details  Name: Kyle Garcia MRN: [id number] Date of Birth: [birth year]  SLP Diagnosis: Dysphagia;Dysarthria;Cognitive Impairments  Rehab Potential: Good ELOS: 3.5-4 weeks    Today's Date: 07/11/2019 SLP Individual Time: 0700-0800 SLP Individual Time Calculation (min): 60 min   Problem List:  Patient Active Problem List   Diagnosis Date Noted  . Hx of ischemic multifocal multiple vascular territories stroke 07/10/2019  . Dysphagia due to recent cerebrovascular accident 07/10/2019  . Fever 07/10/2019  . Right middle cerebral artery stroke ([place]) 07/10/2019  . Acute right arterial ischemic stroke, middle cerebral artery (MCA) ([place]) s/p mechanical thrombectomy 07/03/2019  . Middle cerebral artery embolism, right 07/03/2019  . CAP (community acquired pneumonia) 06/20/2019  . Stroke ([place])   . Stroke due to embolism ([place]) 06/17/2019  . Encounter for antineoplastic chemotherapy 06/05/2019  . Encounter for antineoplastic immunotherapy 06/05/2019  . Goals of care, counseling/discussion 06/05/2019  . Adenocarcinoma of right lung, stage 4 ([place]) 06/04/2019  . Cough 05/21/2019  . S/P bronchoscopy with biopsy   . DVT (deep venous thrombosis) ([place]) 05/08/2019  . Weight loss 04/28/2019  . Aspiration pneumonia ([place]) 04/10/2019  . GERD without esophagitis 11/14/2015  . Hyperlipidemia 11/14/2015  . Benign prostatic hyperplasia 12/24/2008   Past Medical History:  Past Medical History:  Diagnosis Date  . Cancer ([place])    stage 4 adenocarcinoma right lung  . GERD (gastroesophageal reflux disease)   . Hyperlipidemia   . Stroke [facility])    25-30 emboli seen on screening MRI   Past Surgical History:  Past Surgical History:  Procedure Laterality Date  . CATARACT EXTRACTION  2016  . EYE SURGERY    . IR CT HEAD LTD  07/03/2019  . IR PERCUTANEOUS ART THROMBECTOMY/INFUSION INTRACRANIAL INC DIAG ANGIO  07/03/2019  . RADIOLOGY WITH ANESTHESIA N/A  07/03/2019   Procedure: IR WITH ANESTHESIA;  Surgeon: Luanne Bras, MD;  Location: [place];  Service: Radiology;  Laterality: N/A;  . VIDEO BRONCHOSCOPY Bilateral 05/21/2019   Procedure: VIDEO BRONCHOSCOPY WITH FLUORO;  Surgeon: Collene Gobble, MD;  Location: [facility] ENDOSCOPY;  Service: Cardiopulmonary;  Laterality: Bilateral;    Assessment / Plan / Recommendation Clinical Impression Patient is a 68 year old right-handed male with history of recent right lower extremityDVTconfirmed by Doppler 4/22/2020maintained on Xareltoas well as multiple embolic cortical and subcortical bilateral infarcts identified on MRI from most recent admission in June 2020 during work-up of DVT,chronic anemia,stage IV adenocarcinoma of the right lung followed by [name] started on chemotherapy 06/17/2019, hyperlipidemia, patient quit smoking 4 months ago. Per chart review patient lives with spouse. 1 level home 2 steps to entry. Independent and retired. Presented 07/02/2013 with left side weakness, right gaze deviation. Cranial CT/CTApositive for emergent large vessel occlusion with proximal right M2-M3 occlusion. No TPA as patient was on Xarelto. Patient underwent cerebral angiogram followed by revascularization of occluded MCA dominant mid division with x2 passes also partial revascularization of occluded ACA A2-A3 region per interventional radiology. Patient did require ventilatory support for a short time. Follow-up MRI after revascularization shows multiple areas of acute infarction right parietal region and numerous punctate acute infarcts in both parietal regions left more than right. Recent echocardiogram with ejection fraction of 09% normal systolic function. No source of embolus. Neurology follow-up patient initially on intravenous heparin and transitioned to Eliquis. No plan for TEE and advised to continue Eliquis. Patient with low-grade fever on 07/07/2019 with chest x-ray completed showing  persistent infiltrate right lungand was placed on  Unasyn. Therapy evaluation completed and patient was admitted for a comprehensive rehab program 07/10/19.  Patient demonstrates severe cognitive impairments impacting arousal, sustained attention, initiation, orientation, functional problem solving, intellectual awareness and daily recall of information. Patient also is labile with language of confusion and hallucinations at times. Patient demonstrates moderate left oral-motor weakness which impacts his speech intelligibility due to imprecise consonants which is further exacerbated by fatigue. Patient's left oral-motor weakness also results in moderate anterior spillage with both solids and liquids and left buccal pocketing with solid textures that requires cues to clear. Patient also consistently demonstrated prolonged oral transit and multiple swallows and only subtle overt s/s of aspiration X 2. Patient's safety with PO intake is further exacerbated by poor head control which leaves his head in a flexed position and turned to the left. Recommend patient remain on Dys. 2 textures with honey-thick liquids via cup with full supervision to maximize safety and overall PO intake. Patient would benefit from skilled SLP intervention to maximize his cognitive and swallowing function prior to discharge.    Skilled Therapeutic Interventions          Administered a cognitive-linguistic evaluation and BSE, please see above for details. Educated patient in regards to current cognitive and swallowing deficits and goals of skilled SLP intervention, he verbalized understanding.    SLP Assessment  Patient will need skilled Speech Lanaguage Pathology Services during CIR admission    Recommendations  SLP Diet Recommendations: Dysphagia 2 (Fine chop);Honey Liquid Administration via: Cup Medication Administration: Crushed with puree Supervision: Full supervision/cueing for compensatory strategies;Patient able to self  feed;Staff to assist with self feeding Compensations: Small sips/bites;Slow rate;Minimize environmental distractions;Monitor for anterior loss;Lingual sweep for clearance of pocketing Postural Changes and/or Swallow Maneuvers: Seated upright 90 degrees Oral Care Recommendations: Oral care BID Recommendations for Other Services: Neuropsych consult Patient destination: Home Follow up Recommendations: Home Health SLP;24 hour supervision/assistance Equipment Recommended: To be determined    SLP Frequency 3 to 5 out of 7 days   SLP Duration  SLP Intensity  SLP Treatment/Interventions 3.5-4 weeks  Minumum of 1-2 x/day, 30 to 90 minutes  Cognitive remediation/compensation;Dysphagia/aspiration precaution training;Internal/external aids;Speech/Language facilitation;Therapeutic Activities;Environmental controls;Cueing hierarchy;Functional tasks;Patient/family education    Pain    Prior Functioning Type of Home: House  Lives With: Spouse Available Help at Discharge: Family;Available 24 hours/day  Short Term Goals: Week 1: SLP Short Term Goal 1 (Week 1): Patient will consume current diet with minimal overt s/s of aspiration and Mod verbal cues for use of swallowing compensatory strategies. SLP Short Term Goal 2 (Week 1): Patient will verbally answer open-ended questions in 50% of opportunities with Max A multimodal cues. SLP Short Term Goal 3 (Week 1): Patient will demonstrate sustained attention to functional tasks for 5 minutes with Max A verbal cues for redirection. SLP Short Term Goal 4 (Week 1): Patient will initiate functional tasks with Max A multimodal cues.  Refer to Care Plan for Long Term Goals  Recommendations for other services: Neuropsych  Discharge Criteria: Patient will be discharged from SLP if patient refuses treatment 3 consecutive times without medical reason, if treatment goals not met, if there is a change in medical status, if patient makes no progress towards goals  or if patient is discharged from hospital.  The above assessment, treatment plan, treatment alternatives and goals were discussed and mutually agreed upon: by patient  Analysa Nutting 07/11/2019, 2:00 PM

## 2019-07-11 NOTE — Evaluation (Signed)
Occupational Therapy Assessment and Plan  Patient Details  Name: Kyle Garcia MRN: [id number] Date of Birth: 1951/03/20  OT Diagnosis: acute pain, apraxia, cognitive deficits, disturbance of vision, flaccid hemiplegia and hemiparesis, hemiplegia affecting non-dominant side, muscle weakness (generalized), pain in joint and swelling of limb Rehab Potential: Rehab Potential (ACUTE ONLY): Poor ELOS: 3.5-4 weeks   Today's Date: 07/11/2019 OT Individual Time: 3614-4315 OT Individual Time Calculation (min): 74 min     Problem List:  Patient Active Problem List   Diagnosis Date Noted  . Hx of ischemic multifocal multiple vascular territories stroke 07/10/2019  . Dysphagia due to recent cerebrovascular accident 07/10/2019  . Fever 07/10/2019  . Right middle cerebral artery stroke ([place]) 07/10/2019  . Acute right arterial ischemic stroke, middle cerebral artery (MCA) ([place]) s/p mechanical thrombectomy 07/03/2019  . Middle cerebral artery embolism, right 07/03/2019  . CAP (community acquired pneumonia) 06/20/2019  . Stroke ([place])   . Stroke due to embolism ([place]) 06/17/2019  . Encounter for antineoplastic chemotherapy 06/05/2019  . Encounter for antineoplastic immunotherapy 06/05/2019  . Goals of care, counseling/discussion 06/05/2019  . Adenocarcinoma of right lung, stage 4 ([place]) 06/04/2019  . Cough 05/21/2019  . S/P bronchoscopy with biopsy   . DVT (deep venous thrombosis) ([place]) 05/08/2019  . Weight loss 04/28/2019  . Aspiration pneumonia ([place]) 04/10/2019  . GERD without esophagitis 11/14/2015  . Hyperlipidemia 11/14/2015  . Benign prostatic hyperplasia 12/24/2008    Past Medical History:  Past Medical History:  Diagnosis Date  . Cancer ([place])    stage 4 adenocarcinoma right lung  . GERD (gastroesophageal reflux disease)   . Hyperlipidemia   . Stroke [facility])    25-30 emboli seen on screening MRI   Past Surgical History:  Past Surgical History:  Procedure Laterality Date  .  CATARACT EXTRACTION  2016  . EYE SURGERY    . IR CT HEAD LTD  07/03/2019  . IR PERCUTANEOUS ART THROMBECTOMY/INFUSION INTRACRANIAL INC DIAG ANGIO  07/03/2019  . RADIOLOGY WITH ANESTHESIA N/A 07/03/2019   Procedure: IR WITH ANESTHESIA;  Surgeon: Luanne Bras, MD;  Location: [place];  Service: Radiology;  Laterality: N/A;  . VIDEO BRONCHOSCOPY Bilateral 05/21/2019   Procedure: VIDEO BRONCHOSCOPY WITH FLUORO;  Surgeon: Collene Gobble, MD;  Location: [facility] ENDOSCOPY;  Service: Cardiopulmonary;  Laterality: Bilateral;    Assessment & Plan Clinical Impression: [name] a 68 year old right-handed male with history of recent right lower extremityDVTconfirmed by Doppler 4/22/2020maintained on Xareltoas well as multiple embolic cortical and subcortical bilateral infarcts identified on MRI from most recent admission in June 2020 during work-up of DVT,chronic anemia,stage IV adenocarcinoma of the right lung followed by Dr. Earlie Garcia started on chemotherapy 06/17/2019, hyperlipidemia, patient quit smoking 4 months ago. Per chart review patient lives with spouse. 1 level home 2 steps to entry. Independent and retired. Presented 07/02/2013 with left side weakness, right gaze deviation. Cranial CT/CTApositive for emergent large vessel occlusion with proximal right M2-M3 occlusion. No TPA as patient was on Xarelto. Patient underwent cerebral angiogram followed by revascularization of occluded MCA dominant mid division with x2 passes also partial revascularization of occluded ACA A2-A3 region per interventional radiology. Patient did require ventilatory support for a short time. Follow-up MRI after revascularization shows multiple areas of acute infarction right parietal region and numerous punctate acute infarcts in both parietal regions left more than right. Recent echocardiogram with ejection fraction of 40% normal systolic function. No source of embolus. Neurology follow-up patient initially on  intravenous heparin and transitioned to Eliquis. No plan  for TEE and advised to continue Eliquis. Patient with low-grade fever on 07/07/2019 with chest x-ray completed showing persistent infiltrate right lungand was placed on Unasyn. Urine study negative nitrite. Presently on a dysphagia #2 honey thick liquid diet. Therapy evaluation completed and patient was admitted for a comprehensive rehab program.  Patient currently requires total with basic self-care skills secondary to muscle weakness, muscle joint tightness and muscle paralysis, decreased cardiorespiratoy endurance, impaired timing and sequencing, abnormal tone, unbalanced muscle activation, motor apraxia, decreased coordination and decreased motor planning, decreased visual perceptual skills, decreased attention to left, left side neglect and decreased motor planning, decreased initiation, decreased attention, decreased awareness, decreased problem solving, decreased safety awareness and delayed processing and decreased postural control and hemiplegia.  Prior to hospitalization, patient could complete BADLs with independent .  Patient will benefit from skilled intervention to increase independence with basic self-care skills prior to discharge home with spouse.  Anticipate patient will require 24 hour supervision and moderate physical assestance and follow up home health.  OT - End of Session Endurance Deficit: Yes OT Assessment Rehab Potential (ACUTE ONLY): Poor OT Barriers to Discharge: Medical stability;Incontinence;Lack of/limited family support OT Patient demonstrates impairments in the following area(s): Balance;Cognition;Edema;Endurance;Motor;Pain;Perception;Safety;Sensory;Skin Integrity;Vision OT Basic ADL's Functional Problem(s): Eating;Grooming;Bathing;Dressing;Toileting OT Advanced ADL's Functional Problem(s): Simple Meal Preparation OT Transfers Functional Problem(s): Toilet;Tub/Shower OT Additional Impairment(s): Fuctional  Use of Upper Extremity OT Plan OT Intensity: Minimum of 1-2 x/day, 45 to 90 minutes OT Frequency: 5 out of 7 days OT Duration/Estimated Length of Stay: 3.5-4 weeks OT Treatment/Interventions: Balance/vestibular training;DME/adaptive equipment instruction;Patient/family education;Therapeutic Activities;Wheelchair propulsion/positioning;Therapeutic Exercise;Psychosocial support;Functional electrical stimulation;Cognitive remediation/compensation;Community reintegration;Functional mobility training;Self Care/advanced ADL retraining;UE/LE Strength taining/ROM;UE/LE Coordination activities;Neuromuscular re-education;Discharge planning;Disease mangement/prevention;Pain management;Splinting/orthotics;Visual/perceptual remediation/compensation OT Self Feeding Anticipated Outcome(s): Mod A OT Basic Self-Care Anticipated Outcome(s): Mod A OT Toileting Anticipated Outcome(s): Mod A OT Bathroom Transfers Anticipated Outcome(s): Mod A OT Recommendation Recommendations for Other Services: Neuropsych consult Patient destination: Home Follow Up Recommendations:HHOT Equipment Recommended: To be determined Skilled Therapeutic Intervention Skilled OT session completed with focus on initial evaluation, education on OT role/POC, and establishment of patient-centered goals.   Pt greeted in bed, very lethargic with eyes closed, even when responding to OT. Noted delayed verbal responses and Lt lateral rotation of neck. Attempted supine<sit 3 times, with pt initiating return back to bed due to pain in R LE. Per pt: "It hurts like hell." ADL session completed bedlevel for pain mgt. Notified RN to provide him with pain medicine once he is more alert. Pt began washing the Rt side of his face and fell asleep. Once he was woken up, pt continued washing the Rt side of his face. Total A for bathing and acknowledging Lt side of face and also Lt side of body. Flaccidity noted in L UE/LE with episodes of hypertonicity during UE  PROM. Increased tone noted in Rt arm as well. Pt completed ADL tasks with Total A, rolling Rt>Lt with 2 helpers for perihygiene and donning clean brief. He was incontinent of urine. Pillow placed between legs to decrease LE pain, however pt still c/o that rolling movement caused his legs to "hurt like hell." Noted pitting edema in both feet.Throughout session, pt often fell asleep, sometimes verbally responding to OT with delays, and other times not responding at all. HOH for combing hair and washing his hands with sanitizer. When pt was cued to comb hair, he used the wrong side of the brush to do so. At the end of session pt was left in bed with  call bell near Rt hand and bed alarm set.    OT Evaluation Precautions/Restrictions  Precautions Precautions: Fall Precaution Comments: Dense Lt hemi, pusher General Chart Reviewed: Yes Family/Caregiver Present: No Home Living/Prior Functioning Home Living Available Help at Discharge: Family, Available 24 hours/day Type of Home: Mudlogger of Steps: 2 Home Layout: One level Bathroom Shower/Tub: Multimedia programmer: Programmer, systems: Yes  Lives With: Spouse IADL History Occupation: Retired Type of Occupation: Nurse, learning disability Leisure and Hobbies: Spending time with 8 grandchildren IADL Comments: Limited IADL information gathered from pt due to profound lethargy Prior Function Level of Independence: Independent with basic ADLs(per chart) ADL ADL Eating: Not assessed Grooming: Dependent(washing hands) Where Assessed-Grooming: Bed level Upper Body Bathing: Dependent Where Assessed-Upper Body Bathing: Bed level Lower Body Bathing: Dependent Where Assessed-Lower Body Bathing: Bed level Upper Body Dressing: Dependent Where Assessed-Upper Body Dressing: Bed level Lower Body Dressing: Dependent Where Assessed-Lower Body Dressing: Bed level Toileting: Dependent Where Assessed-Toileting: Bed  level Toilet Transfer: Not assessed Tub/Shower Transfer: Not assessed Vision Vision Assessment?: Vision impaired- to be further tested in functional context(Lt gaze) Additional Comments: Minimal eye opening throughout session. Often held onto OT's arm, stating it was the bedrail or his own hand. When cued to look, he still could not correctly distinguish what he was holding Perception  Perception: Impaired Inattention/Neglect: Does not attend to left side of body;Does not attend to right visual field;Does not attend to left visual field Praxis Praxis: Impaired Praxis Impairment Details: Initiation;Motor planning;Perseveration;Ideation Cognition Overall Cognitive Status: Impaired/Different from baseline Arousal/Alertness: Lethargic Orientation Level: Person;Place;Situation Person: Oriented Place: Oriented Situation: Oriented Year: 2020 Month: July Day of Week: Incorrect Immediate Memory Recall: Sock;Blue;Bed Memory Recall Sock: Not able to recall Memory Recall Blue: Not able to recall Memory Recall Bed: (not able to recall) Attention: Sustained Sustained Attention: Impaired Awareness: Impaired Problem Solving: Impaired Safety/Judgment: Impaired Sensation Sensation Light Touch: Impaired by gross assessment Hot/Cold: Impaired Detail Hot/Cold Impaired Details: Impaired LUE;Impaired LLE(No reaction from pt when adversive stimuli was applied to affected limbs) Proprioception: Impaired by gross assessment;Impaired Detail Proprioception Impaired Details: Impaired RUE Coordination Gross Motor Movements are Fluid and Coordinated: No Fine Motor Movements are Fluid and Coordinated: No Coordination and Movement Description: Dense Lt hemiplegia Finger Nose Finger Test: Pt lacked the sustained attention needed to complete this assessment with Rt hand Motor  Motor Motor: Hemiplegia;Abnormal tone;Motor apraxia Mobility    2 assist for bed mobility during self care tasks Trunk/Postural  Assessment  Cervical Assessment Cervical Assessment: Exceptions to WFL(Laterally rotated towards Lt) Thoracic Assessment Thoracic Assessment: (Lt lean when sitting upright in bed) Postural Control Postural Control: Deficits on evaluation(impaired)  Balance Balance Balance Assessed: No Extremity/Trunk Assessment RUE Assessment RUE Assessment: Exceptions to [facility] Active Range of Motion (AROM) Comments: WNL shoulder flexion, limited shoulder abduction General Strength Comments: 1+ Modified Ashworth Scale LUE Assessment LUE Assessment: Exceptions to [facility] Passive Range of Motion (PROM) Comments: WNL, a little limited with forearm supination General Strength Comments: Flaccid, 1 Modified Ashworth Scale     Refer to Care Plan for Long Term Goals  Recommendations for other services: None   Discharge Criteria: Patient will be discharged from OT if patient refuses treatment 3 consecutive times without medical reason, if treatment goals not met, if there is a change in medical status, if patient makes no progress towards goals or if patient is discharged from hospital.  The above assessment, treatment plan, treatment alternatives and goals were discussed and mutually agreed upon: by patient  Taejah Ohalloran A Raphael Espe 07/11/2019, 12:26 PM

## 2019-07-11 NOTE — Plan of Care (Signed)
  Problem: RH BOWEL ELIMINATION Goal: RH STG MANAGE BOWEL WITH ASSISTANCE Description: STG Manage Bowel with MOD I  Assistance. Outcome: Progressing   Problem: RH BLADDER ELIMINATION Goal: RH STG MANAGE BLADDER WITH ASSISTANCE Description: STG Manage Bladder With MIN Assistance Outcome: Progressing   Problem: RH SAFETY Goal: RH STG ADHERE TO SAFETY PRECAUTIONS W/ASSISTANCE/DEVICE Description: STG Adhere to Safety Precautions With MIN Assistance/Device. Outcome: Progressing   Problem: RH PAIN MANAGEMENT Goal: RH STG PAIN MANAGED AT OR BELOW PT'S PAIN GOAL Description: AT OR BELOW LEVEL 4 Outcome: Progressing

## 2019-07-11 NOTE — Evaluation (Signed)
Physical Therapy Assessment and Plan  Patient Details  Name: Kyle Garcia MRN: [id number] Date of Birth: [birth year]  PT Diagnosis: Abnormal posture, Difficulty walking, Edema, Hypertonia, Impaired cognition and Muscle weakness Rehab Potential: Good ELOS: 3.5-4 weeks   Today's Date: 07/11/2019 PT Individual Time: 1300-1400 PT Individual Time Calculation (min): 60 min    Problem List:  Patient Active Problem List   Diagnosis Date Noted  . Hx of ischemic multifocal multiple vascular territories stroke 07/10/2019  . Dysphagia due to recent cerebrovascular accident 07/10/2019  . Fever 07/10/2019  . Right middle cerebral artery stroke ([place]) 07/10/2019  . Acute right arterial ischemic stroke, middle cerebral artery (MCA) ([place]) s/p mechanical thrombectomy 07/03/2019  . Middle cerebral artery embolism, right 07/03/2019  . CAP (community acquired pneumonia) 06/20/2019  . Stroke ([place])   . Stroke due to embolism ([place]) 06/17/2019  . Encounter for antineoplastic chemotherapy 06/05/2019  . Encounter for antineoplastic immunotherapy 06/05/2019  . Goals of care, counseling/discussion 06/05/2019  . Adenocarcinoma of right lung, stage 4 ([place]) 06/04/2019  . Cough 05/21/2019  . S/P bronchoscopy with biopsy   . DVT (deep venous thrombosis) ([place]) 05/08/2019  . Weight loss 04/28/2019  . Aspiration pneumonia ([place]) 04/10/2019  . GERD without esophagitis 11/14/2015  . Hyperlipidemia 11/14/2015  . Benign prostatic hyperplasia 12/24/2008    Past Medical History:  Past Medical History:  Diagnosis Date  . Cancer ([place])    stage 4 adenocarcinoma right lung  . GERD (gastroesophageal reflux disease)   . Hyperlipidemia   . Stroke [facility])    25-30 emboli seen on screening MRI   Past Surgical History:  Past Surgical History:  Procedure Laterality Date  . CATARACT EXTRACTION  2016  . EYE SURGERY    . IR CT HEAD LTD  07/03/2019  . IR PERCUTANEOUS ART THROMBECTOMY/INFUSION INTRACRANIAL INC DIAG ANGIO   07/03/2019  . RADIOLOGY WITH ANESTHESIA N/A 07/03/2019   Procedure: IR WITH ANESTHESIA;  Surgeon: Luanne Bras, MD;  Location: [place];  Service: Radiology;  Laterality: N/A;  . VIDEO BRONCHOSCOPY Bilateral 05/21/2019   Procedure: VIDEO BRONCHOSCOPY WITH FLUORO;  Surgeon: Collene Gobble, MD;  Location: [facility] ENDOSCOPY;  Service: Cardiopulmonary;  Laterality: Bilateral;    Assessment & Plan Clinical Impression: Patient is a 68 y.o. year old male with history of recent right lower extremityDVTconfirmed by Doppler 4/22/2020maintained on Xareltoas well as multiple embolic cortical and subcortical bilateral infarcts identified on MRI from most recent admission in June 2020 during work-up of DVT,chronic anemia,stage IV adenocarcinoma of the right lung followed by [name] started on chemotherapy 06/17/2019, hyperlipidemia, patient quit smoking 4 months ago. Per chart review patient lives with spouse. 1 level home 2 steps to entry. Independent and retired. Presented 07/02/2013 with left side weakness, right gaze deviation. Cranial CT/CTApositive for emergent large vessel occlusion with proximal right M2-M3 occlusion. No TPA as patient was on Xarelto. Patient underwent cerebral angiogram followed by revascularization of occluded MCA dominant mid division with x2 passes also partial revascularization of occluded ACA A2-A3 region per interventional radiology. Patient did require ventilatory support for a short time. Follow-up MRI after revascularization shows multiple areas of acute infarction right parietal region and numerous punctate acute infarcts in both parietal regions left more than right. Recent echocardiogram with ejection fraction of 70% normal systolic function. No source of embolus. Neurology follow-up patient initially on intravenous heparin and transitioned to Eliquis. No plan for TEE and advised to continue Eliquis. Patient with low-grade fever on 07/07/2019 with chest x-ray  completed  showing persistent infiltrate right lungand was placed on Unasyn. Urine study negative nitrite. Presently on a dysphagia #2 honey thick liquid diet. Therapy evaluation completed and patient was admitted for a comprehensive rehab program..  Patient transferred to CIR on 07/10/2019 .   Patient currently requires total with mobility secondary to muscle weakness, abnormal tone, decreased coordination and decreased motor planning, decreased initiation, decreased attention and decreased awareness and decreased sitting balance, hemiplegia and decreased balance strategies.  Prior to hospitalization, patient was independent  with mobility and lived with Spouse in a House home.  Home access is 2 .  Patient will benefit from skilled PT intervention to maximize safe functional mobility, minimize fall risk and decrease caregiver burden for planned discharge home with 24 hour assist.  Anticipate patient will benefit from follow up [facility] at discharge.  PT - End of Session Activity Tolerance: Tolerates 30+ min activity with multiple rests Endurance Deficit: Yes PT Assessment Rehab Potential (ACUTE/IP ONLY): Good PT Barriers to Discharge: Inaccessible home environment;Nutrition means;Pending chemo/radiation PT Patient demonstrates impairments in the following area(s): Balance;Edema;Behavior;Endurance;Motor;Nutrition;Pain;Safety;Sensory PT Transfers Functional Problem(s): Bed Mobility;Bed to Chair;Car;Furniture;Floor PT Locomotion Functional Problem(s): Ambulation;Wheelchair Mobility;Stairs PT Plan PT Intensity: Minimum of 1-2 x/day ,45 to 90 minutes PT Frequency: 5 out of 7 days PT Duration Estimated Length of Stay: 3.5-4 weeks PT Treatment/Interventions: Ambulation/gait training;Balance/vestibular training;Cognitive remediation/compensation;Disease management/prevention;Functional mobility training;Therapeutic Exercise;Visual/perceptual remediation/compensation;Splinting/orthotics;Patient/family  education;Discharge planning;Functional electrical stimulation;Pain management;Skin care/wound management;Therapeutic Activities;UE/LE Coordination activities;Wheelchair propulsion/positioning;Stair training;UE/LE Strength taining/ROM;Psychosocial support;Neuromuscular re-education;DME/adaptive equipment instruction;Community reintegration PT Transfers Anticipated Outcome(s): mod assist PT Locomotion Anticipated Outcome(s): max assist PT Recommendation Follow Up Recommendations: Home health PT Patient destination: Home Equipment Recommended: To be determined  Skilled Therapeutic Intervention Evaluation completed (see details above and below) with education on PT POC and goals and individual treatment initiated with focus on functional mobility, OOB activity tolerance, trunk and head control and sitting balance. Pt supine in bed upon PT arrival, agreeable to therapy tx and no evidence of pain at rest. Therapist and NT assisting to change pt's soiled brief, pt incontinent of bladder. Pt performed rolling to the L with max assist, rolling to the R with total assist while NT and therapist assisted with pericare and donning clean briefs/pants. Pt transferred to sitting EOB with max assist +2. In sitting pt with immediate pushing to the L using R UE, therapist working on decreasing R UE support in order to minimize pushing, verbal and tactile cues to correct pushing. Squat pivot to TIS w/c this session total assist +2. Pt transported to the gym, pt remains lethargic throughout session and only responds to therapist variably. While in [place] with pt sitting upright worked on head control with therapist providing assist to prevent increased cervical flexion. Worked on cervical PROM and AROM this session performing cervical rotation and cervical extension. Attempted to work on bean bag reaching task with R UE however pt unable to attend to task and requires hand over hand assist in order to reach for/find bean bag. Pt  transported back to room and worked on eating lunch with use of R UE to feed himself, pt unable to grasp spoon without hand over hand assist and then decreased accuracy bringing spoon to mouth, pt also requires assist for head control while sitting up in w/c to eat. Pt left in care of NT to continue eating, chair alarm set.    PT Evaluation Precautions/Restrictions Precautions Precautions: Fall Precaution Comments: Dense Lt hemi, pusher Restrictions Weight Bearing Restrictions: No General   Vital SignsTherapy Vitals Temp:  97.6 F (36.4 C) Pulse Rate: (!) 113 Resp: 18 BP: 117/70 Patient Position (if appropriate): Sitting Oxygen Therapy SpO2: 97 % O2 Device: Room Air Pain   reports pain in R LE with movements, appears to have pain in L hip Home Living/Prior Functioning Home Living Available Help at Discharge: Family;Available 24 hours/day Type of Home: House Entrance Stairs-Number of Steps: 2 Home Layout: One level Bathroom Shower/Tub: Multimedia programmer: Standard Bathroom Accessibility: Yes  Lives With: Spouse Prior Function Level of Independence: Independent with basic ADLs;Independent with gait(per chart review, will follow up with wife regarding PLOF) Vocation: Retired(per chart review) Comments: Retired. Vision/Perception  Vision - Assessment Additional Comments: Minimal eye opening throughout session. Often held onto OT's arm, stating it was the bedrail or his own hand. When cued to look, he still could not correctly distinguish what he was holding Perception Perception: Impaired Inattention/Neglect: Does not attend to left side of body;Does not attend to right visual field;Does not attend to left visual field Praxis Praxis: Impaired Praxis Impairment Details: Initiation;Motor planning;Perseveration;Ideation  Cognition Overall Cognitive Status: Impaired/Different from baseline Arousal/Alertness: Lethargic Orientation Level: Oriented to person;Oriented  to place Attention: Sustained Sustained Attention: Impaired Sustained Attention Impairment: Verbal basic;Functional basic Memory: Impaired Memory Impairment: Decreased recall of new information;Storage deficit;Decreased short term memory Decreased Short Term Memory: Verbal basic;Functional basic Awareness: Impaired Awareness Impairment: Intellectual impairment Problem Solving: Impaired Problem Solving Impairment: Verbal basic;Functional basic Executive Function: (all impaired due to lower level deficits) Behaviors: Lability Safety/Judgment: Impaired Sensation Sensation Light Touch: Impaired by gross assessment(unable to formally assess secondary to cognitive deficits) Hot/Cold: Impaired Detail Hot/Cold Impaired Details: Impaired LUE;Impaired LLE(No reaction from pt when adversive stimuli was applied to affected limbs) Proprioception: Not tested Proprioception Impaired Details: Impaired RUE Additional Comments: unable to formally assess secondary to cognitive deficits Coordination Gross Motor Movements are Fluid and Coordinated: No Fine Motor Movements are Fluid and Coordinated: No Coordination and Movement Description: L hemiparesis and increased tone limiting coordination Finger Nose Finger Test: Pt lacked the sustained attention needed to complete this assessment with Rt hand Motor  Motor Motor: Hemiplegia;Abnormal tone;Abnormal postural alignment and control Motor - Skilled Clinical Observations: L hemiparesis, hypertonia and decreased head/trunk control  Mobility Bed Mobility Bed Mobility: Rolling Right;Rolling Left Rolling Right: 2 Helpers Rolling Left: Maximal Assistance - Patient 25-49% Transfers Transfers: Pharmacist, hospital Pivot Transfers: 2 Helpers;Total Assistance - Patient < 25% Locomotion  Gait Ambulation: No Gait Gait: No Stairs / Additional Locomotion Stairs: No Wheelchair Mobility Wheelchair Mobility: Yes Wheelchair Assistance: Dependent -  Patient 0% Wheelchair Parts Management: Needs assistance  Trunk/Postural Assessment  Cervical Assessment Cervical Assessment: Exceptions to WFL(forward head posture, cervical extensor weakness, poor head control) Thoracic Assessment Thoracic Assessment: Exceptions to WFL(rounded shoulders) Lumbar Assessment Lumbar Assessment: Exceptions to WFL(posterior pelvic tilt in sitting) Postural Control Postural Control: Deficits on evaluation Head Control: impaired, inability to maintain cervical extension in sitting against gravity Trunk Control: L lateral lean Protective Responses: impaired  Balance Balance Balance Assessed: Yes Static Sitting Balance Static Sitting - Level of Assistance: 3: Mod assist;2: Max assist Dynamic Sitting Balance Dynamic Sitting - Level of Assistance: 1: +1 Total assist;2: Max assist Sitting balance - Comments: pt pushing to the L with L lateral lean Extremity Assessment  RUE Assessment RUE Assessment: Exceptions to [facility] Active Range of Motion (AROM) Comments: WNL shoulder flexion, limited shoulder abduction General Strength Comments: 1+ Modified Ashworth Scale LUE Assessment LUE Assessment: Exceptions to [facility] Passive Range of Motion (PROM) Comments: WNL, a  little limited with forearm supination General Strength Comments: Flaccid, 1 Modified Ashworth Scale RLE Assessment RLE Assessment: Exceptions to WFL(edema noted secondary to hx of DVT) General Strength Comments: unable to formally assess secondary to cognition. appears to have antigravity movements, 3+ to 4/5 throughout LLE Assessment Passive Range of Motion (PROM) Comments: impaired. DF to neutral, limited hip abduction (increased adductor tone), tight hamstrings with increased hamstring tone General Strength Comments: unable to formally assess, no muscle contraction noted in L LE.    Refer to Care Plan for Long Term Goals  Recommendations for other services: None   Discharge Criteria: Patient will  be discharged from PT if patient refuses treatment 3 consecutive times without medical reason, if treatment goals not met, if there is a change in medical status, if patient makes no progress towards goals or if patient is discharged from hospital.  The above assessment, treatment plan, treatment alternatives and goals were discussed and mutually agreed upon: by patient  Netta Corrigan, PT, DPT 07/11/2019, 2:51 PM

## 2019-07-11 NOTE — Progress Notes (Signed)
Initial Nutrition Assessment  DOCUMENTATION CODES:  Not applicable  INTERVENTION:  Continue Ensure Enlive po BID, each supplement provides 350 kcal and 20 grams of protein  Vital Shake with meals, each supplement provides 480-500 kcals and 20-23 grams of protein. These are NTL at baseline and do not require as much thickener to be added.   Please note, pt has lost >14% of his BW (~25 lbs) in less than 6 months.   Pt has not been weighed in >1 week. When able, please weigh patient to help assess adequacy of oral intake.   NUTRITION DIAGNOSIS:  Inadequate oral intake related to lethargy/confusion(Recent CVA) as evidenced by meal completion < 25%.  GOAL:  Patient will meet greater than or equal to 90% of their needs   MONITOR:  PO intake, Labs, I & O's, Supplement acceptance, Weight trends, Diet advancement   REASON FOR ASSESSMENT:  Malnutrition Screening Tool    ASSESSMENT:  68 y/o male HLD, GERD, Stage IV lung cancer (shemo start 6/24) and recurrent strokes. Presents to CIR following hospitalization 5/64-3/32 for embolic shower CVA and R MCA occlusion s/p attempted mechanical thrombectomy of multiple vessels.   RD operating remotely d/t covid precautions. Was unable to reach patient via phone- pt answered, but sounds like he fell back asleep shortly after answering  Per review of intake records, pts has been eating poorly since he was extubated and diet advanced on 7/12. He is not documented as eating more than 25% of a meal and several of his meals are only 5-10% completed.   Pt has not been weighed since he presented to [facility] with stroke over a week ago. He was 148.3 lbs. Will request reweight. Per chart, pt was 173.5 lbs 5 months ago. He has lost 25 lbs (14.4% bw) in <6 months. His weight appears to have been stable over last month though (146-149) lbs in June  Pt meets intake criterion for severe malnutrition in acute context, but not in chronic context. Similarly, pt meets  weight loss criterion in chronic context, but not in acute context. Unable to dx with malnutrition at this time, however given recent intake records, anticipate he has lost a significant amount of weight over the past week and as such will meet criteria for severe malnutrition.    Pt is currently on a D2, HTL diet. He is on Ensure enlive and he has consumed 1 out of 2 of these since admitted to CIR. It appears he consumed about 50% of the Ensures he was ordered during recent admission as well. Will continue these for now. RD will try to add Vital shakes to trays. RD not on site to order these in food system, will enter order in dietary comments.   Labs: Na:132, BG:101 Meds: Ensure Enlive, MVI with min, folate, IV abx  Recent Labs  Lab 07/08/19 0638 07/09/19 0438 07/10/19 0555  NA 133* 134* 132*  K 4.1 3.8 4.3  CL 102 104 104  CO2 23 22 20*  BUN 13 16 19   CREATININE 0.77 0.85 0.77  CALCIUM 7.7* 7.7* 7.5*  GLUCOSE 105* 117* 101*   NUTRITION - FOCUSED PHYSICAL EXAM: Unable to conduct  Diet Order:   Diet Order            DIET DYS 2 Room service appropriate? Yes; Fluid consistency: Honey Thick  Diet effective now             EDUCATION NEEDS:  No education needs have been identified at this time  Skin:  MASD to groin  Last BM:  7/18  Height:  Ht Readings from Last 1 Encounters:  07/03/19 5\' 11"  (1.803 m)   Weight:  Wt Readings from Last 1 Encounters:  07/03/19 67.3 kg   Wt Readings from Last 10 Encounters:  07/03/19 67.3 kg  06/24/19 67 kg  06/20/19 65.8 kg  06/17/19 66.5 kg  06/04/19 67.6 kg  05/08/19 69.9 kg  04/28/19 72.3 kg  04/15/19 69.9 kg  04/10/19 75.5 kg  03/05/19 77.2 kg   Ideal Body Weight:  78.2 kg  BMI:  There is no height or weight on file to calculate BMI.  Estimated Nutritional Needs:  Kcal:  2000-2200 kcals (30-33 kcal/kg bw) Protein:  100-120g Pro (1.5-1.8g/kg bw) Fluid:  >2 L (20 ml/kg bw)  Burtis Junes RD, LDN, CNSC Clinical  Nutrition Available Tues-Sat via Pager: [phone] 07/11/2019 5:54 PM

## 2019-07-12 NOTE — Progress Notes (Signed)
[place] PHYSICAL MEDICINE & REHABILITATION PROGRESS NOTE   Subjective/Complaints:  Sleeping but awakens to voice   ROS- cannot obtain due to lethargy   Objective:   No results found. Recent Labs    07/10/19 0555  WBC 5.9  HGB 8.3*  HCT 26.0*  PLT 151   Recent Labs    07/10/19 0555  NA 132*  K 4.3  CL 104  CO2 20*  GLUCOSE 101*  BUN 19  CREATININE 0.77  CALCIUM 7.5*    Intake/Output Summary (Last 24 hours) at 07/12/2019 0907 Last data filed at 07/12/2019 0718 Gross per 24 hour  Intake 930 ml  Output -  Net 930 ml     Physical Exam: Vital Signs Blood pressure 135/72, pulse 95, temperature 98.7 F (37.1 C), resp. rate 16, SpO2 99 %.   General: No acute distress, lethargic with delay in response, inconsistent with commands Mood and affect are appropriate Heart: Regular rate and rhythm no rubs murmurs or extra sounds Lungs: Clear to auscultation, breathing unlabored, no rales or wheezes Abdomen: Positive bowel sounds, soft nontender to palpation, nondistended Extremities: No clubbing, cyanosis, or edema Skin: No evidence of breakdown, no evidence of rash Neurologic: Cranial nerves II through XII intact, motor strength is 5/5 in Right and 0/5 left  deltoid, bicep, tricep, grip, hip flexor, knee extensors, ankle dorsiflexor and plantar flexor +triple flexor response in LLE  Sensory exam winces to pinch LUE and LLE  Musculoskeletal: Full range of motion in all 4 extremities. No joint swelling    Assessment/Plan: 1. Functional deficits secondary to Right MCA infarct with dysphagia  which require 3+ hours per day of interdisciplinary therapy in a comprehensive inpatient rehab setting.  Physiatrist is providing close team supervision and 24 hour management of active medical problems listed below.  Physiatrist and rehab team continue to assess barriers to discharge/monitor patient progress toward functional and medical goals  Care Tool:  Bathing        Body parts bathed by helper: Right arm, Left arm, Chest, Abdomen, Front perineal area, Buttocks, Right upper leg, Left upper leg, Right lower leg, Left lower leg, Face     Bathing assist Assist Level: 2 Helpers     Upper Body Dressing/Undressing Upper body dressing   What is the patient wearing?: Hospital gown only    Upper body assist Assist Level: Total Assistance - Patient < 25%    Lower Body Dressing/Undressing Lower body dressing      What is the patient wearing?: Incontinence brief     Lower body assist Assist for lower body dressing: Total Assistance - Patient < 25%     Toileting Toileting    Toileting assist Assist for toileting: 2 Helpers     Transfers Chair/bed transfer  Transfers assist     Chair/bed transfer assist level: 2 Helpers     Locomotion Ambulation   Ambulation assist   Ambulation activity did not occur: Safety/medical concerns          Walk 10 feet activity   Assist  Walk 10 feet activity did not occur: Safety/medical concerns        Walk 50 feet activity   Assist Walk 50 feet with 2 turns activity did not occur: Safety/medical concerns         Walk 150 feet activity   Assist Walk 150 feet activity did not occur: Safety/medical concerns         Walk 10 feet on uneven surface  activity   Assist  Walk 10 feet on uneven surfaces activity did not occur: Safety/medical concerns         Wheelchair     Assist Will patient use wheelchair at discharge?: Yes Type of Wheelchair: Manual    Wheelchair assist level: Dependent - Patient 0%(TIS w/c) Max wheelchair distance: 150 ft    Wheelchair 50 feet with 2 turns activity    Assist        Assist Level: Dependent - Patient 0%   Wheelchair 150 feet activity     Assist     Assist Level: Dependent - Patient 0%    Medical Problem List and Plan: 1.Dense lefthemiparesiswith dysphagiasecondary to embolic shower right MCA occlusion status  post reperfusion. Infarct felt to be embolic secondary to hypercoagulable state from lung cancer -CIR PT, OT,  SLP  2. Antithrombotics: -DVT/anticoagulation/history of right lower extremity LMB:[id number] -antiplatelet therapy: N/A 3. Pain Management:Tylenol as needed 4. Mood:Provide emotional support -antipsychotic agents: N/A 5. Neuropsych: This patientiscapable of making decisions on hisown behalf. 6. Skin/Wound Care:Routine skin checks 7. Fluids/Electrolytes/Nutrition:Routine in and outs with follow-up chemistries 8.Stage IV adenocarcinoma right lung. Chemotherapy as per [name] 9. Aspiration pneumonia. Continue Unasyn initiated 07/08/2019 x 5-day course. Afebrile, WBC normal  -aspiration precautions 10. Dysphagia. Dysphasia #2 honey thick liquids. Follow-up speech therapy. Advance diet as possible. 11. Hyperlipidemia. Crestor 12. Remote tobacco abuse. Counseling 13.Acute on chronic anemia. Hgb slightly up at 8.3 will cont to monitor   14 Hypocalcemia- likely ok if corrected check  CMET in am  , no clinical signs and unchanged from prior values  LOS: 2 days A FACE TO [place] E Analeah Brame 07/12/2019, 9:07 AM

## 2019-07-13 ENCOUNTER — Inpatient Hospital Stay (HOSPITAL_COMMUNITY): Payer: Medicare Other | Admitting: Speech Pathology

## 2019-07-13 ENCOUNTER — Inpatient Hospital Stay (HOSPITAL_COMMUNITY): Payer: Medicare Other | Admitting: Physical Therapy

## 2019-07-13 ENCOUNTER — Inpatient Hospital Stay (HOSPITAL_COMMUNITY): Payer: Medicare Other | Admitting: Occupational Therapy

## 2019-07-13 DIAGNOSIS — I63511 Cerebral infarction due to unspecified occlusion or stenosis of right middle cerebral artery: Secondary | ICD-10-CM

## 2019-07-13 DIAGNOSIS — G8114 Spastic hemiplegia affecting left nondominant side: Secondary | ICD-10-CM

## 2019-07-13 LAB — COMPREHENSIVE METABOLIC PANEL
ALT: 23 U/L (ref 0–44)
AST: 24 U/L (ref 15–41)
Albumin: 2.3 g/dL — ABNORMAL LOW (ref 3.5–5.0)
Alkaline Phosphatase: 87 U/L (ref 38–126)
Anion gap: 8 (ref 5–15)
BUN: 8 mg/dL (ref 8–23)
CO2: 25 mmol/L (ref 22–32)
Calcium: 8 mg/dL — ABNORMAL LOW (ref 8.9–10.3)
Chloride: 103 mmol/L (ref 98–111)
Creatinine, Ser: 0.7 mg/dL (ref 0.61–1.24)
GFR calc Af Amer: >60 mL/min (ref >60)
GFR calc non Af Amer: >60 mL/min (ref >60)
Glucose, Bld: 108 mg/dL — ABNORMAL HIGH (ref 70–99)
Potassium: 4.1 mmol/L (ref 3.5–5.1)
Sodium: 136 mmol/L (ref 135–145)
Total Bilirubin: 0.6 mg/dL (ref 0.3–1.2)
Total Protein: 5.2 g/dL — ABNORMAL LOW (ref 6.5–8.1)

## 2019-07-13 LAB — CBC WITH DIFFERENTIAL/PLATELET
Abs Immature Granulocytes: 0.1 10*3/uL — ABNORMAL HIGH (ref 0.00–0.07)
Basophils Absolute: 0 10*3/uL (ref 0.0–0.1)
Basophils Relative: 1 %
Eosinophils Absolute: 0.1 10*3/uL (ref 0.0–0.5)
Eosinophils Relative: 1 %
HCT: 29.1 % — ABNORMAL LOW (ref 39.0–52.0)
Hemoglobin: 9.2 g/dL — ABNORMAL LOW (ref 13.0–17.0)
Immature Granulocytes: 1 %
Lymphocytes Relative: 6 %
Lymphs Abs: 0.5 10*3/uL — ABNORMAL LOW (ref 0.7–4.0)
MCH: 27.1 pg (ref 26.0–34.0)
MCHC: 31.6 g/dL (ref 30.0–36.0)
MCV: 85.8 fL (ref 80.0–100.0)
Monocytes Absolute: 0.8 10*3/uL (ref 0.1–1.0)
Monocytes Relative: 9 %
Neutro Abs: 6.8 10*3/uL (ref 1.7–7.7)
Neutrophils Relative %: 82 %
Platelets: 206 10*3/uL (ref 150–400)
RBC: 3.39 MIL/uL — ABNORMAL LOW (ref 4.22–5.81)
RDW: 16.2 % — ABNORMAL HIGH (ref 11.5–15.5)
WBC: 8.3 10*3/uL (ref 4.0–10.5)
nRBC: 0 % (ref 0.0–0.2)

## 2019-07-13 MED ORDER — CALCIUM CARBONATE-VITAMIN D 500-200 MG-UNIT PO TABS
2.0000 | ORAL_TABLET | Freq: Two times a day (BID) | ORAL | Status: DC
  Administered 2019-07-13 – 2019-08-14 (×65): 2 via ORAL
  Filled 2019-07-13 (×66): qty 2

## 2019-07-13 MED ORDER — SODIUM CHLORIDE 0.45 % IV SOLN
INTRAVENOUS | Status: DC
  Administered 2019-07-13 – 2019-07-26 (×14): via INTRAVENOUS

## 2019-07-13 NOTE — Progress Notes (Signed)
Cristina Gong, RN  Rehab Admission Coordinator  Physical Medicine and Rehabilitation  Progress Notes  Addendum  Date of Service:  07/10/2019 2:57 PM             Show:Clear all [x] Manual[] Template[x] Copied  Added by: [x] Cristina Gong, RN  [] Hover for details         [name], MD  Physician  Physical Medicine and Rehabilitation  PMR Pre-admission   Signed   Date of Service:  07/10/2019 11:40 AM         Related encounter: ED to Hosp-Admission (Current) from 07/03/2019 in [facility]            Signed          Show:Clear all [x] ?Manual[x] ?Template[x] ?Copied  Added by: [x] ?Cristina Gong, RN[x] ?[name], MD  [] ?Hover for details PMR Admission Coordinator Pre-Admission Assessment  Patient:Kyle Neudeckeris an 68 y.o.,male ZOX:[id number] DOB:[birth year] Height:5' 11"  (180.3 cm) Weight:67.3 kg  Insurance Information HMO: PPO: PCP: IPA: 80/20: OTHER: no HMO PRIMARY:Medicare a and bPolicy#: [id number]: pt Benefits: Phone #: passport one onlineName: 7/13 Eff. Date:1/1/2018Deduct: [id number] of Pocket Max: NONELife Max: NONE CIR:100%SNF: 20 full days Outpatient:80%Co-Pay: 20% Home Health:100%Co-Pay: none DME:80%Co-Pay: 205 Providers:pt choice  SECONDARY:[employer] LifePolicy#: [id number] Subscriber: pt  Medicaid Application Date: Case Manager:  Disability Application Date: Case Worker:   The "Data Collection Information Summary"for patients in Inpatient Rehabilitation Facilities with attached "[place] provided and verbally reviewed with: Family  Emergency Contact Information         Contact Information   Name Relation Home Work Mobile    [place] Spouse [phone]  [phone]       Current Medical  History Patient Admitting Diagnosis:CVA History of Present Illness:Kyle Garcia a 68 year old right-handed male with history of recent right lower extremityDVTconfirmed by Doppler 4/22/2020maintained on Xareltoas well as multiple embolic cortical and subcortical bilateral infarcts identified on MRI from most recent admission in June 2020 during work-up of DVT,chronic anemia,stage IV adenocarcinoma of the right lung followed by [name] started on chemotherapy 06/17/2019, hyperlipidemia, patient quit smoking 4 months ago. Presented 07/02/2013 with left side weakness, right gaze deviation. Cranial CT/CTApositive for emergent large vessel occlusion with proximal right M2-M3 occlusion. No TPA as patient was on Xarelto. Patient underwent cerebral angiogram followed by revascularization of occluded MCA dominant mid division with x2 passes also partial revascularization of occluded ACA A2-A3 region per interventional radiology. Patient did require ventilatory support for a short time. Follow-up MRI after revascularization shows multiple areas of acute infarction right parietal region and numerous punctate acute infarcts in both parietal regions left more than right. Recent echocardiogram with ejection fraction of 38% normal systolic function. No source of embolus. Neurology follow-up patient initially on intravenous heparin and transitioned to Eliquis. No plan for TEE and advised to continue Eliquis. Patient with low-grade fever on 07/07/2019 with chest x-ray completed showing persistent infiltrate right lungand was placed on Unasyn. Urine study negative nitrite. Presently on a dysphagia #2 honey thick liquid diet.  Complete NIHSS TOTAL: 9  [facility] and physician.  Past Medical History     Past Medical History:  Diagnosis Date  . Cancer ([place])    stage 4  adenocarcinoma right lung  . GERD (gastroesophageal reflux disease)   . Hyperlipidemia   . Stroke [facility])    25-30 emboli seen on screening MRI    Family History family history is not on file.  Prior Rehab/Hospitalizations Has the patient had prior rehab or hospitalizations prior to admission?Yes  Has the patient had major surgery during 100 days prior to admission?yes  Current Medications  Current Facility-Administered Medications:  .stroke: mapping our early stages of recovery book, , Does not apply, Once, Aroor, Karena Addison R, MD .0.9 % sodium chloride infusion, 250 mL, Intravenous, Continuous, Rosalin Hawking, MD, Last Rate: 40 mL/hr at 07/10/19 0500 .acetaminophen (TYLENOL) tablet 650 mg, 650 mg, Oral, Q4H PRN, 650 mg at 07/08/19 1426 **OR** acetaminophen (TYLENOL) solution 650 mg, 650 mg, Per Tube, Q4H PRN, 650 mg at 07/09/19 0029 **OR** [DISCONTINUED] acetaminophen (TYLENOL) suppository 650 mg, 650 mg, Rectal, Q4H PRN, Deveshwar, Sanjeev, MD .Ampicillin-Sulbactam (UNASYN) 3 g in sodium chloride 0.9 % 100 mL IVPB, 3 g, Intravenous, Q6H, Rosalin Hawking, MD, Last Rate: 200 mL/hr at 07/10/19 0409, 3 g at 07/10/19 0409 .apixaban (ELIQUIS) tablet 5 mg, 5 mg, Oral, BID, Dang, Thuy D, RPH, 5 mg at 07/09/19 2142 .chlorhexidine (PERIDEX) 0.12 % solution 15 mL, 15 mL, Mouth Rinse, BID, Rosalin Hawking, MD, 15 mL at 07/09/19 2142 .feeding supplement (ENSURE ENLIVE) (ENSURE ENLIVE) liquid 237 mL, 237 mL, Oral, BID BM, Rosalin Hawking, MD, 237 mL at 07/09/19 1047 .folic acid (FOLVITE) tablet 1 mg, 1 mg, Per Tube, Daily, Garvin Fila, MD, 1 mg at 07/09/19 1047 .ketorolac (TORADOL) 15 MG/ML injection 15 mg, 15 mg, Intravenous, Q6H, Kerney Elbe, MD, 15 mg at 07/10/19 0636 .multivitamin with minerals tablet 1 tablet, 1 tablet, Per Tube, Daily, Garvin Fila, MD, 1 tablet at 07/09/19 1047 .[employer], , Oral, PRN, Rosalin Hawking, MD .rosuvastatin  (CRESTOR) tablet 20 mg, 20 mg, Oral, q1800, Agarwala, Ravi, MD, 20 mg at 07/09/19 1738  Patients Current Diet:         Diet Order                DIET DYS 2 Room service appropriate? Yes; Fluid consistency: Honey ThickDiet effective now                  Precautions / Restrictions Precautions Precautions: Fall Precaution Comments: pushes hard to L  Restrictions Weight Bearing Restrictions: No  Has the patient had 2 or more falls or a fall with injury in the past year?No  Prior Activity Level Community (5-7x/wk): Had not driven in 1 month pta due to excessive coughing spells per wife  Prior Functional Level Self Care: Did the patient need help bathing, dressing, using the toilet or eating?Independent  Indoor Mobility: Did the patient need assistance with walking from room to room (with or without device)?Independent  Stairs: Did the patient need assistance with internal or external stairs (with or without device)?Independent  Functional Cognition: Did the patient need help planning regular tasks such as shopping or remembering to take medications?Independent  Home Assistive Devices / Equipment Home Assistive Devices/Equipment: None  Prior Device Use: Indicate devices/aids used by the patient prior to current illness, exacerbation or injury?None of the above  Current Functional Level Cognition  Arousal/Alertness: Awake/alert Overall Cognitive Status: Impaired/Different from baseline Current Attention Level: Sustained Orientation Level: Oriented X4 Following Commands: Follows one step commands with increased time, Follows one step commands inconsistently Safety/Judgement: Decreased awareness of deficits, Decreased awareness of safety General Comments: pt fixated on getting "something to drink" Attention: Sustained Sustained Attention: Impaired Sustained Attention Impairment: Verbal basic, Functional basic Memory: Impaired  Memory Impairment: Decreased recall of new information Awareness: Impaired Awareness Impairment: Intellectual impairment, Emergent impairment Problem Solving: Impaired Problem Solving Impairment:  Verbal basic, Functional basic Safety/Judgment: Impaired   Extremity Assessment (includes Sensation/Coordination)  Upper Extremity Assessment: Defer to OT evaluation, LUE deficits/detail LUE Deficits / Details: No active movement LUE. Increased intermittent tone noted in LUE, noted some edema in hand LUE Sensation: decreased light touch(decreased pinch) LUE Coordination: decreased fine motor, decreased gross motor Lower Extremity Assessment: LLE deficits/detail, RLE deficits/detail RLE Deficits / Details: Swelling present throughout esp in lower calf likely due to DVT. LLE Deficits / Details: No active movement throughout. Some trace in hip flexors. Sustained clonus at ankle. No response to pressure, noxious stimulus. Able to feel cold hands. LLE Sensation: decreased light touch, decreased proprioception LLE Coordination: decreased fine motor, decreased gross motor   ADLs  Overall ADL's : Needs assistance/impaired Eating/Feeding: Cueing for safety, Minimal assistance Eating/Feeding Details (indicate cue type and reason): cues for chin tuck Grooming: Wash/dry face, Minimal assistance Grooming Details (indicate cue type and reason): using [place] on his own Upper Body Bathing: Moderate assistance, Bed level Lower Body Bathing: Total assistance, Bed level Upper Body Dressing : Maximal assistance, Bed level Lower Body Dressing: Total assistance, Bed level Toilet Transfer: Moderate assistance, +2 for physical assistance Toilet Transfer Details (indicate cue type and reason): lateral scoot from bed to drop arm recliner going to pt's right Toileting- Clothing Manipulation and Hygiene: Total assistance Toileting - Clothing Manipulation Details (indicate cue type and reason): Max A partial  stand Functional mobility during ADLs: +2 for physical assistance, +2 for safety/equipment General ADL Comments: Pt with increased pushing tendencies this session, requiring +2 for upright sitting balance. Unsafe to complete any transfers this session, did complete lateral scoot towards HOB with max assist +2   Mobility  Overal bed mobility: Needs Assistance Bed Mobility: Rolling, Sidelying to Sit, Sit to Sidelying Rolling: Max assist, +2 for safety/equipment Sidelying to sit: Max assist, +2 for physical assistance Supine to sit: Max assist, +2 for physical assistance Sit to supine: Max assist, +2 for safety/equipment Sit to sidelying: Total assist, +2 for physical assistance General bed mobility comments: vc for sequencing, multimodal cues and assist for R knee flexion, and hand over hand assist for reaching to L bed rail; assist at hips and trunk to come into sitting   Transfers  Overall transfer level: Needs assistance Transfers: Lateral/Scoot Transfers Sit to Stand: (partial stand to place new alarm pad under him) Squat pivot transfers: +2 physical assistance, Max assist Lateral/Scoot Transfers: +2 physical assistance, Max assist General transfer comment: unsafe to complete transfer OOB this session due to increase in pushing, therefore engaged in lateral scoots x2 along EOB with partial sit > stand +2   Ambulation / Gait / Stairs / Wheelchair Mobility  Ambulation/Gait General Gait Details: Unable   Posture / Balance Dynamic Sitting Balance Sitting balance - Comments: max A required to maintain sitting balance EOB with positioning of R UE to decrease pushing; worked on midline posture with therapist seated beside pt and briefly pt able to maintain sitting balance with mod A  Balance Overall balance assessment: Needs assistance Sitting-balance support: Single extremity supported, Feet supported Sitting balance-Leahy Scale: Poor(zero to poor) Sitting balance -  Comments: max A required to maintain sitting balance EOB with positioning of R UE to decrease pushing; worked on midline posture with therapist seated beside pt and briefly pt able to maintain sitting balance with mod A  Postural control: Left lateral lean(pushing to L with R UE) Standing balance support: During functional activity Standing balance-Leahy Scale: Zero Standing balance comment: Max A of  2 for standing   Special needs/care consideration BiPAP/CPAPn/a CPMN/a Continuous Drip IVN/a Dialysisn/a Life VestN/a OxygenN/a Special BedN/a Trach SizeN/a Wound Vacn/a Skinintact Bowel mgmt:Incontinent LBM 7/16 Bladder mgmt:external catheter Diabetic mgmt:Hgb A1c 5.7 Behavioral considerationN/a Chemo/radiationyes has just completed first round of chem for new dx of Lung ca   Previous Home Environment Living Arrangements: Spouse/significant other Lives With: Spouse Available Help at Discharge: Family, Available 24 hours/day Type of Home: House Home Layout: One level Home Access: (through garage) Entrance Stairs-Rails: None Entrance Stairs-Number of Steps: 2 Bathroom Shower/Tub: Multimedia programmer: Standard Bathroom Accessibility: Yes How Accessible: Accessible via walker [place]: No  Discharge Living Setting Plans for Discharge Living Setting: Patient's home, Lives with (comment)(wife) Type of Home at Discharge: House Discharge Home Layout: One level Discharge Home Access: Stairs to enter Entrance Stairs-Rails: None(entry through garage) Technical brewer of Steps: 2 Discharge Bathroom Shower/Tub: Horticulturist, commercial: Standard Discharge Bathroom Accessibility: Yes How Accessible: Accessible via walker Does the patient have any problems obtaining your medications?: No  Social/Family/Support Systems Patient Roles: Spouse, Parent Contact Information: wife, [name] Anticipated Caregiver: wife  and adult local children, has son and daughter local Anticipated Caregiver's Contact Information: see above Ability/Limitations of Caregiver: wife is petite, children local and live close by, one daughter [name] is out of town Building control surveyor Availability: 24/7 Discharge Plan Discussed with Primary Caregiver: Yes Is Caregiver In Agreement with Plan?: Yes Does Caregiver/Family have Issues with Lodging/Transportation while Pt is in Rehab?: No  Goals/Additional Needs Patient/Family Goal for Rehab: min with PTand OT, supervision with SLP Expected length of stay: ELOS 14 to 20 days Pt/Family Agrees to Admission and willing to participate: Yes Program Orientation Provided &Reviewed with Pt/Caregiver Including Roles &Responsibilities: Yes  Decrease burden of Care through IP rehab admission:n/a  Possible need for SNF placement upon discharge:not anticipated  Patient Condition:[facility] spoken with[facility], andpatient, spouse and daughter. Imet with patient at the bedsidefor inpatient rehabilitation assessment. Patient will benefit from ongoing PT, can actively participate in 3 hours of therapy a day 5 days of the week, and can make measurable gains during the admission. Patient will also benefit from the coordinated team approach during an Inpatient Acute Rehabilitation admission. The patient will receive intensive therapy as well as Rehabilitation physician, nursing, social worker, and care management interventions. Due to bladder management, bowel management, safety, skin/wound care, disease management, medication administration, pain management and patient educationthe patient requires 24 hour a day rehabilitation nursing. The patient is currently mod to maxwith mobility and basic ADLs. Discharge setting and therapy post discharge at home with home healthis anticipated. Patient has agreed to participate in the Acute Inpatient Rehabilitation  Program and will admit today.  Preadmission Screen Completed VF:IEPPIRJ, Salomon Fick MSN,7/17/202011:40 AM ______________________________________________________________________  Discussed status with Dr.Swartzon 7/17/2020at 1150and received approval for admission today.  Admission Coordinator:Letecia Arps, Audelia Acton, RN, time1150Date7/17/2020  Assessment/Plan: Diagnosis:embolic right MCA infarct 1. Does the need for close, 24 hr/day Medical supervision in concert with the patient's rehab needs make it unreasonable for this patient to be served in a less intensive setting?Yes and Potentially 2. Co-Morbidities requiring supervision/potential complications:DVT, prior CVA, lung cancer 3. Due tobladder management, bowel management, safety, skin/wound care, disease management, medication administration, pain management and patient education, does the patient require 24 hr/day rehab nursing?Yes 4. Does the patient require coordinated care of a physician, rehab nurse,PT (1-2hrs/day, 5days/week), OT (1-2hrs/day, 5days/week) and SLP (1-2hrs/day, 5days/week)to address physical and functional deficits in the  context of the above medical diagnosis(es)?Yes Addressing deficits in the following areas:balance, endurance, locomotion, strength, transferring, bowel/bladder control, bathing, dressing, feeding, grooming, toileting, cognition, speech and psychosocial support 5. Can the patient actively participate in an intensive therapy program of at least 3 hrs of therapy 5 days a week?Yes 6. The potential for patient to make measurable gains while on inpatient rehab isexcellent 7. Anticipated functional outcomes upon discharge from inpatients are:min assistPT, min assistOT, supervisionSLP 8. Estimated rehab length of stay to reach the above functional goals is:14-20 days 9. Anticipated D/C setting:Home 10. Anticipated post D/C treatments:HH therapy 11. Overall  Rehab/Functional Prognosis:excellent  MD Signature: [name], MD, [place] [facility] 07/10/2019        Revision History                         Revision History

## 2019-07-13 NOTE — Progress Notes (Signed)
Inpatient Rehabilitation  Patient information reviewed and entered into eRehab system by Hannelore Bova M. Nyriah Coote, M.A., CCC/SLP, PPS Coordinator.  Information including medical coding, functional ability and quality indicators will be reviewed and updated through discharge.    

## 2019-07-13 NOTE — Care Management (Signed)
[facility] Individual Statement of Services  Patient Name:  Kyle Garcia  Date:  07/13/2019  Welcome to the [facility].  Our goal is to provide you with an individualized program based on your diagnosis and situation, designed to meet your specific needs.  With this comprehensive rehabilitation program, you will be expected to participate in at least 3 hours of rehabilitation therapies Monday-Friday, with modified therapy programming on the weekends.  Your rehabilitation program will include the following services:  Physical Therapy (PT), Occupational Therapy (OT), Speech Therapy (ST), 24 hour per day rehabilitation nursing, Therapeutic Recreaction (TR), Neuropsychology, Case Management (Social Worker), Rehabilitation Medicine, Nutrition Services and Pharmacy Services  Weekly team conferences will be held on Tuesdays to discuss your progress.  Your Social Worker will talk with you frequently to get your input and to update you on team discussions.  Team conferences with you and your family in attendance may also be held.  Expected length of stay: 3.5-4 weeks   Overall anticipated outcome: moderate assistance  Depending on your progress and recovery, your program may change. Your Social Worker will coordinate services and will keep you informed of any changes. Your Social Worker's name and contact numbers are listed  below.  The following services may also be recommended but are not provided by the [facility] will be made to provide these services after discharge if needed.  Arrangements include referral to agencies that provide these services.  Your insurance has been verified to be:  Medicare; [employer] Your primary doctor is:  [facility]  Pertinent information will be shared with your doctor and your insurance  company.  Social Worker:  [place], Portal or ([phone]   Information discussed with and copy given to patient by: Lennart Pall, 07/13/2019, 4:03 PM

## 2019-07-13 NOTE — Progress Notes (Signed)
Social Work Assessment and Plan   Patient Details  Name: Kyle Garcia MRN: [id number] Date of Birth: 1951-07-10  Today's Date: 07/13/2019  Problem List:  Patient Active Problem List   Diagnosis Date Noted  . Hx of ischemic multifocal multiple vascular territories stroke 07/10/2019  . Dysphagia due to recent cerebrovascular accident 07/10/2019  . Fever 07/10/2019  . Right middle cerebral artery stroke ([place]) 07/10/2019  . Acute right arterial ischemic stroke, middle cerebral artery (MCA) ([place]) s/p mechanical thrombectomy 07/03/2019  . Middle cerebral artery embolism, right 07/03/2019  . CAP (community acquired pneumonia) 06/20/2019  . Stroke ([place])   . Stroke due to embolism ([place]) 06/17/2019  . Encounter for antineoplastic chemotherapy 06/05/2019  . Encounter for antineoplastic immunotherapy 06/05/2019  . Goals of care, counseling/discussion 06/05/2019  . Adenocarcinoma of right lung, stage 4 ([place]) 06/04/2019  . Cough 05/21/2019  . S/P bronchoscopy with biopsy   . DVT (deep venous thrombosis) ([place]) 05/08/2019  . Weight loss 04/28/2019  . Aspiration pneumonia ([place]) 04/10/2019  . GERD without esophagitis 11/14/2015  . Hyperlipidemia 11/14/2015  . Benign prostatic hyperplasia 12/24/2008   Past Medical History:  Past Medical History:  Diagnosis Date  . Cancer ([place])    stage 4 adenocarcinoma right lung  . GERD (gastroesophageal reflux disease)   . Hyperlipidemia   . Stroke [facility])    25-30 emboli seen on screening MRI   Past Surgical History:  Past Surgical History:  Procedure Laterality Date  . CATARACT EXTRACTION  2016  . EYE SURGERY    . IR CT HEAD LTD  07/03/2019  . IR PERCUTANEOUS ART THROMBECTOMY/INFUSION INTRACRANIAL INC DIAG ANGIO  07/03/2019  . RADIOLOGY WITH ANESTHESIA N/A 07/03/2019   Procedure: IR WITH ANESTHESIA;  Surgeon: Luanne Bras, MD;  Location: [place];  Service: Radiology;  Laterality: N/A;  . VIDEO BRONCHOSCOPY Bilateral 05/21/2019   Procedure: VIDEO  BRONCHOSCOPY WITH FLUORO;  Surgeon: Collene Gobble, MD;  Location: [facility] ENDOSCOPY;  Service: Cardiopulmonary;  Laterality: Bilateral;   Social History:  reports that he quit smoking about 5 months ago. His smoking use included cigars. He has never used smokeless tobacco. He reports current alcohol use of about 4.0 - 7.0 standard drinks of alcohol per week. He reports that he does not use drugs.  Family / Support Systems Marital Status: Married Patient Roles: Spouse, Parent Spouse/Significant Other: wife, Kyle Garcia @ (C) [phone] Children: 3 adult children with 2 living locally:  Kyle Garcia, Kyle Garcia and Kyle Garcia) Anticipated Caregiver: wife and adult local children, has son and daughter local Ability/Limitations of Caregiver: wife is petite, children local and live close by, one daughter Kyle Garcia is out of town Building control surveyor Availability: 24/7 Family Dynamics: Pt talking on phone with wife during my interview and appropriate.  Responds well to her and talks positively about his children and dog, Kyle Garcia.  Social History Preferred language: English Religion: Lutheran Cultural Background: NA Read: Yes Write: Yes Employment Status: Retired Date Retired/Disabled/Unemployed: ~ 2yrs ago Public relations account executive Issues: None Guardian/Conservator: None -per MD, pt is capable of making decisions on his own behalf.   Abuse/Neglect Abuse/Neglect Assessment Can Be Completed: Yes Physical Abuse: Denies Verbal Abuse: Denies Sexual Abuse: Denies Exploitation of patient/patient's resources: Denies Self-Neglect: Denies  Emotional Status Pt's affect, behavior and adjustment status: Pt sitting up in TIS w/c, leaning to left and making intermittent eye contact.  He does answer basic questions without difficulty.  He presents with a flat affect but denies any emotional distress.  Focused on phone call  he just had with his wife and making comments about his dog.  Will involve neuropsychology while  here for additional eval and information. Recent Psychosocial Issues: recent surgery had incidental finding of "multiple small strokes" per wife;  recent diagnosis of lung cancer and had just had one chemo treatment. Psychiatric History: None Substance Abuse History: none  Patient / Family Perceptions, Expectations & Goals Pt/Family understanding of illness & functional limitations: Pt states, "I had a stroke" but limited appreciation of his deficits when he tells me he can get to the bathroom on his own.  Wife and family with good understanding of medical issues and current functional deficits. Premorbid pt/family roles/activities: Pt was completely indepedent PTA, however, wife does note that his memory was declining and question if possibly due to chemo or small infarcts. Anticipated changes in roles/activities/participation: Wife will need to provide primary caregiver support.  Adult children also to share in assistance. Pt/family expectations/goals: "I just hope he makes good progress.  I'm not a big person."  (wife)  [employer]: Other (Comment)([facility]) Premorbid Home Care/DME Agencies: None Transportation available at discharge: yes Resource referrals recommended: Neuropsychology  Discharge Planning Living Arrangements: Spouse/significant other Support Systems: Spouse/significant other, Children Type of Residence: Private residence Insurance Resources: [employer] Financial Resources: Radio broadcast assistant Screen Referred: No Living Expenses: Medical laboratory scientific officer Management: Patient, Spouse Does the patient have any problems obtaining your medications?: No Home Management: Pt and spouse Patient/Family Preliminary Plans: Pt to d/c home with wife as primary caregiver.  Wife considering hiring some private duty care in addition to support of children. Social Work Anticipated Follow Up Needs: HH/OP Expected length of stay: 3-4  weeks  Clinical Impression Very unfortunate gentleman with recent diagnosis of lung cancer and now with multiple infarcts and significant impairments.  Good family support with wife to be primary caregiver, however, she is rather small in stature and concerned about level of physical assistance he may need.  Pt able to answer very basic questions and reports he is aware he suffered a CVA.  Will monitor mood and refer for neuropsychology when appropriate.    Isais Klipfel 07/13/2019, 3:57 PM

## 2019-07-13 NOTE — Progress Notes (Signed)
Orthopedic Tech Progress Note Patient Details:  Kyle Garcia 03/03/1951 [id number]  Patient ID: Floyce Stakes, male   DOB: 1951/09/03, 68 y.o.   MRN: [id number] Called [facility]anger for prafo/who.  Melony Overly T 07/13/2019, 10:45 AM

## 2019-07-13 NOTE — Progress Notes (Signed)
Nutrition Follow-up  RD working remotely.  DOCUMENTATION CODES:   Not applicable  INTERVENTION:   - Please obtain new weight, RD reached out to RN to request new weight  - Ensure Enlive po BID, each supplement provides 350 kcal and 20 grams of protein, thickened to honey-thick consistency  - Magic cup TID with meals, each supplement provides 290 kcal and 9 grams of protein  - MVI with minerals daily  NUTRITION DIAGNOSIS:   Inadequate oral intake related to lethargy/confusion (recent CVA) as evidenced by meal completion < 25%.  Ongoing, being addressed via oral nutrition supplemnts  GOAL:   Patient will meet greater than or equal to 90% of their needs  Progressing  MONITOR:   PO intake, Labs, I & O's, Supplement acceptance, Weight trends, Diet advancement, TF tolerance  REASON FOR ASSESSMENT:   Malnutrition Screening Tool    ASSESSMENT:   68 y/o male HLD, GERD, Stage IV lung cancer and recurrent strokes. Presents to CIR following hospitalization 6/64-4/03 for embolic shower and R MCA occlusion s/p attempted mechanical thrombectomy of multiple vessels.  Spoke with RN via phone call. RN plans to obtain weight so that RD can assess for malnutrition.  RD attempted to speak with pt via phone call to room. "All circuits are busy" messaged received.  RD will order Magic Cup with meals as it is already thickened to pudding-thick consistency.  Meal Completion: 10-40% x 4 recorded meals  Medications reviewed and include: Ensure Enlive BID, Oscal with D, folic acid, MVI with minerals, IV abx IVF: 1/2NS @ 75 ml/hr q HS for 12 hours  Labs reviewed: hemoglobin 9.2  NUTRITION - FOCUSED PHYSICAL EXAM:  Unable to complete at this time.  Diet Order:   Diet Order            DIET DYS 2 Room service appropriate? Yes; Fluid consistency: Honey Thick  Diet effective now              EDUCATION NEEDS:   No education needs have been identified at this time  Skin:  Skin  Assessment: Reviewed RN Assessment (MASD to groin)  Last BM:  07/13/19  Height:   Ht Readings from Last 1 Encounters:  07/03/19 5\' 11"  (1.803 m)    Weight:   Wt Readings from Last 1 Encounters:  07/03/19 67.3 kg    Ideal Body Weight:  78.2 kg  BMI:  There is no height or weight on file to calculate BMI.  Estimated Nutritional Needs:   Kcal:  2000-2200 kcals (30-33 kcal/kg bw)  Protein:  100-120g Pro (1.5-1.8g/kg bw)  Fluid:  >2 L (20 ml/kg bw)    Gaynell Face, MS, RD, LDN Inpatient Clinical Dietitian Pager: [phone] Weekend/After Hours: [phone]

## 2019-07-13 NOTE — Progress Notes (Signed)
Occupational Therapy Session Note  Patient Details  Name: Kyle Garcia MRN: [id number] Date of Birth: 1951/02/14  Today's Date: 07/13/2019 OT Individual Time: 6967-8938 OT Individual Time Calculation (min): 30 min    Short Term Goals: Week 1:  OT Short Term Goal 1 (Week 1): Pt will complete 1 grooming task EOB with 1 assist for sitting balance OT Short Term Goal 2 (Week 1): Pt will initiate washing affected arm with max vcs OT Short Term Goal 3 (Week 1): Pt will exhibit improved sustained attention by initiating placing 1 limb into UB garment  Skilled Therapeutic Interventions/Progress Updates:    Patient seated in TIS w/c, states that he is uncomfortable.  Assisted with washing hands - max A at sink.  Sit pivot transfer w/c to bed with max A of one and stand by of 2nd person - tactile and visual cues for midline orientation and strategies to reduce amount of pushing to the left.  He presents with significant left neglect, confusion - calling out to his wife who is not in the room.   He is pleasant and cooperative, able to follow basic directions.  Unsupported sitting at edge of bed with min to max A - varies with his tendency to push to the left - he does better when right elbow is on target/pillow on right side.  Sit to supine with max A of one - completed light trunk mobility activities, PROM/AAROM left UE - able to achieve full PROM left UE with increased time and inhibitory techniques.  He is resting in bed at close of session with bed alarm set and call bell in reach.    Therapy Documentation Precautions:  Precautions Precautions: Fall Precaution Comments: Dense Lt hemi, pusher Restrictions Weight Bearing Restrictions: No General:   Vital Signs:  Pain: Pain Assessment Pain Scale: 0-10 Pain Score: 3  Pain Type: Chronic pain Pain Location: Back Pain Orientation: Lower Pain Descriptors / Indicators: Aching Pain Intervention(s): Repositioned;RN made aware   Other Treatments:      Therapy/Group: Individual Therapy  Carlos Levering 07/13/2019, 12:02 PM

## 2019-07-13 NOTE — Progress Notes (Signed)
Occupational Therapy Session Note  Patient Details  Name: Kyle Garcia MRN: [id number] Date of Birth: 1951/05/21  Today's Date: 07/13/2019 OT Individual Time: 1300-1400 OT Individual Time Calculation (min): 60 min    Short Term Goals: Week 1:  OT Short Term Goal 1 (Week 1): Pt will complete 1 grooming task EOB with 1 assist for sitting balance OT Short Term Goal 2 (Week 1): Pt will initiate washing affected arm with max vcs OT Short Term Goal 3 (Week 1): Pt will exhibit improved sustained attention by initiating placing 1 limb into UB garment  Skilled Therapeutic Interventions/Progress Updates:    Patient asleep in bed, easy to arouse but required assistance to swab mouth with oral gel as he was unable to moisten mouth independently in order to talk.  Total assist to roll in bed, complete hygiene and change soiled brief.  Total assist for LB dressing and to donn footwear.  Max A for side lying to SSP.  Mod A to maintain balance in SSP at edge of bed.  Max A sit pivot transfer with stand by of 2nd person.  UB dressing with max A seated in w/c.  Max A to eat lunch seated in w/c - he is unable to scoop and cannot bring utensil to mouth without food falling off.  Moderate cues for clearance of left side of mouth but less toward end of meal as he became more alert/aware.  He remained in the TIS w/c in reclined position with seat belt alarm set and call bell in hand.  Nursing aware of current positioning.    Therapy Documentation Precautions:  Precautions Precautions: Fall Precaution Comments: Dense Lt hemi, pusher Restrictions Weight Bearing Restrictions: No General:   Vital Signs: Therapy Vitals Pulse Rate: (!) 119 Resp: 18 BP: 117/76 Patient Position (if appropriate): Sitting Oxygen Therapy SpO2: 100 % O2 Device: Room Air Pain: Pain Assessment Pain Scale: 0-10 Pain Score: 2  Pain Type: Chronic pain Pain Location: Back Pain Orientation: Lower Pain Descriptors / Indicators:  Aching Pain Intervention(s): Repositioned   Other Treatments:     Therapy/Group: Individual Therapy  Carlos Levering 07/13/2019, 3:50 PM

## 2019-07-13 NOTE — Progress Notes (Addendum)
[place] PHYSICAL MEDICINE & REHABILITATION PROGRESS NOTE   Subjective/Complaints:  Up in bed. PT working on getting started. Doesn't want to do anything because of back pain and left sided spasms  ROS: Limited due to cognitive/behavioral    Objective:   No results found. Recent Labs    07/13/19 0733  WBC 8.3  HGB 9.2*  HCT 29.1*  PLT 206   Recent Labs    07/13/19 0733  NA 136  K 4.1  CL 103  CO2 25  GLUCOSE 108*  BUN 8  CREATININE 0.70  CALCIUM 8.0*    Intake/Output Summary (Last 24 hours) at 07/13/2019 1024 Last data filed at 07/13/2019 0900 Gross per 24 hour  Intake 270 ml  Output -  Net 270 ml     Physical Exam: Vital Signs Blood pressure 118/68, pulse (!) 104, temperature 98.5 F (36.9 C), resp. rate 18, SpO2 100 %.   Constitutional: No distress . Vital signs reviewed. HEENT: EOMI, oral membranes moist Neck: supple Cardiovascular: RRR without murmur. No JVD    Respiratory: CTA Bilaterally without wheezes or rales. Normal effort    GI: BS +, non-tender, non-distended  Extremities: No clubbing, cyanosis, or edema Skin: No evidence of breakdown, no evidence of rash Neurologic: Cranial nerves II through XII intact, motor strength is 5/5 in Right and 0/5 left  deltoid, bicep, tricep, grip, hip flexor, knee extensors, ankle dorsiflexor and plantar flexor +triple flexor response in LLE ---ongoing. Can be stretched to neutral Sensory exam senses pain LUE and LLE  Musculoskeletal: no joint swelling   Assessment/Plan: 1. Functional deficits secondary to Right MCA infarct with dysphagia  which require 3+ hours per day of interdisciplinary therapy in a comprehensive inpatient rehab setting.  Physiatrist is providing close team supervision and 24 hour management of active medical problems listed below.  Physiatrist and rehab team continue to assess barriers to discharge/monitor patient progress toward functional and medical goals  Care Tool:  Bathing       Body parts bathed by helper: Right arm, Left arm, Chest, Abdomen, Front perineal area, Buttocks, Right upper leg, Left upper leg, Right lower leg, Left lower leg, Face     Bathing assist Assist Level: 2 Helpers     Upper Body Dressing/Undressing Upper body dressing   What is the patient wearing?: Hospital gown only    Upper body assist Assist Level: Dependent - Patient 0%    Lower Body Dressing/Undressing Lower body dressing      What is the patient wearing?: Incontinence brief     Lower body assist Assist for lower body dressing: 2 Helpers     Toileting Toileting    Toileting assist Assist for toileting: 2 Helpers     Transfers Chair/bed transfer  Transfers assist     Chair/bed transfer assist level: Dependent - mechanical lift     Locomotion Ambulation   Ambulation assist   Ambulation activity did not occur: Safety/medical concerns          Walk 10 feet activity   Assist  Walk 10 feet activity did not occur: Safety/medical concerns        Walk 50 feet activity   Assist Walk 50 feet with 2 turns activity did not occur: Safety/medical concerns         Walk 150 feet activity   Assist Walk 150 feet activity did not occur: Safety/medical concerns         Walk 10 feet on uneven surface  activity   Assist  Walk 10 feet on uneven surfaces activity did not occur: Safety/medical concerns         Wheelchair     Assist Will patient use wheelchair at discharge?: Yes Type of Wheelchair: Manual    Wheelchair assist level: Dependent - Patient 0%(TIS w/c) Max wheelchair distance: 150 ft    Wheelchair 50 feet with 2 turns activity    Assist        Assist Level: Dependent - Patient 0%   Wheelchair 150 feet activity     Assist     Assist Level: Dependent - Patient 0%    Medical Problem List and Plan: 1.Dense lefthemiparesiswith dysphagiasecondary to embolic shower right MCA occlusion status post  reperfusion. Infarct felt to be embolic secondary to hypercoagulable state from lung cancer -CIR PT, OT,  SLP  Ongoing  -encouraged pt to work thru his pain as much as possible as it's likely to be worse the more he's in bed  -PRAFO, WHO Left side 2. Antithrombotics: -DVT/anticoagulation/history of right lower extremity ZSM:OLMBEML -antiplatelet therapy: N/A 3. Pain Management:Tylenol as needed  -antispasmodics 4. Mood:Provide emotional support -antipsychotic agents: N/A 5. Neuropsych: This patientiscapable of making decisions on hisown behalf. 6. Skin/Wound Care:Routine skin checks 7. Fluids/Electrolytes/Nutrition:   -IVF  -labs reviewed 8.Stage IV adenocarcinoma right lung. Chemotherapy as per [name] 9. Aspiration pneumonia. Continue Unasyn initiated 07/08/2019 x 5-day course. Afebrile, WBC 8.3  -aspiration precautions 10. Dysphagia. Dysphasia #2 honey thick liquids.   -advance diet per SLP  -IVF HS 11. Hyperlipidemia. Crestor 12. Remote tobacco abuse. Counseling 13.Acute on chronic anemia. Hgb slightly up at 8.3 will cont to monitor   14 Hypocalcemia- sl improved, still low  -ongoing observation  -supplementation  -likely related to #8  LOS: 3 days A FACE TO FACE EVALUATION WAS PERFORMED  [name] 07/13/2019, 10:24 AM

## 2019-07-13 NOTE — Progress Notes (Signed)
Physical Therapy Session Note  Patient Details  Name: Kyle Garcia MRN: [id number] Date of Birth: [birth year]  Today's Date: 07/13/2019 PT Individual Time: 0807-0903 PT Individual Time Calculation (min): 56 min   Short Term Goals: Week 1:  PT Short Term Goal 1 (Week 1): Pt will maintain sitting balance with CGA PT Short Term Goal 2 (Week 1): Pt will perform bed<>chair transfer with max assist +1 PT Short Term Goal 3 (Week 1): Pt will perform sit<>stand with max assist +2  Skilled Therapeutic Interventions/Progress Updates:  Pt received in bed with session focused on pt education, activity tolerance, bed mobility & OOB tolerance. Pt observed to be incontinent of urine & bowels and pt unaware. Encouraged pt to engage in rolling L<>R with therapist attempting to place hands on bed rails and position UE/LE to allow pt to roll but pt not participating, reporting "I can't" and with c/o pain. Pt requires +2 to roll L<>R to allow therapist to perform peri hygiene & don clean brief dependent assist. Pt transfers to w/c with use of maxi move requiring dependent assist to position pt in TIS w/c as pt with L LOB and pt unaware. Pt requests something to drink and provided with thickened liquid. Pt able to hold drink with R hand and consume liquid without physical assist but cuing for small sips. Propelled pt around unit via dependent assist to attempt to have pt scan environment but pt unable to even focus on therapist when standing in front of him. Pt with very impaired cognition & ability to follow one step commands. Pt with inconsistent reports of need to use restroom and unable to recall incontinence episode at beginning of session. Pt left in w/c with chair alarm donned & call bell in lap.   Therapy Documentation Precautions:  Precautions Precautions: Fall Precaution Comments: Dense Lt hemi, pusher Restrictions Weight Bearing Restrictions: No   Pain: Pt with c/o unrated pain in "back" and "all  over" with heating pad applied upon therapist arrival & pain meds requested & administered by RN during session, rest breaks & repositioning provided.   Therapy/Group: Individual Therapy  [name] 07/13/2019, 9:09 AM

## 2019-07-13 NOTE — IPOC Note (Signed)
Overall Plan of Care [facility]) Patient Details Name: Ashir Kunz MRN: [id number] DOB: Apr 07, 1951  Admitting Diagnosis: <principal problem not specified>  Hospital Problems: Active Problems:   Right middle cerebral artery stroke [facility])     Functional Problem List: Nursing Bladder, Bowel, Edema, Pain, Safety, Sensory, Endurance, Medication Management  PT Balance, Edema, Behavior, Endurance, Motor, Nutrition, Pain, Safety, Sensory  OT Balance, Cognition, Edema, Endurance, Motor, Pain, Perception, Safety, Sensory, Skin Integrity, Vision  SLP Cognition, Nutrition  TR         Basic ADL's: OT Eating, Grooming, Bathing, Dressing, Toileting     Advanced  ADL's: OT Simple Meal Preparation     Transfers: PT Bed Mobility, Bed to Chair, Car, Furniture, Floor  OT Toilet, Metallurgist: PT Ambulation, Emergency planning/management officer, Stairs     Additional Impairments: OT Fuctional Use of Upper Extremity  SLP Swallowing, Communication, Social Cognition expression Social Interaction, Problem Solving, Memory, Attention, Awareness  TR      Anticipated Outcomes Item Anticipated Outcome  Self Feeding Mod A  Swallowing  Min A   Basic self-care  Mod A  Toileting  Mod A   Bathroom Transfers Mod A  Bowel/Bladder  MANAGE BLADDER WITH MIN ASSIST AND BOWEL WITH MOD i  Transfers  mod assist  Locomotion  max assist  Communication  Min A  Cognition  Mod A  Pain  PAIN AT OR BELOW LEVEL 4  Safety/Judgment  maINTAIN SAFETY WITH MIN ASSIST   Therapy Plan: PT Intensity: Minimum of 1-2 x/day ,45 to 90 minutes PT Frequency: 5 out of 7 days PT Duration Estimated Length of Stay: 3.5-4 weeks OT Intensity: Minimum of 1-2 x/day, 45 to 90 minutes OT Frequency: 5 out of 7 days OT Duration/Estimated Length of Stay: 3.5-4 weeks SLP Intensity: Minumum of 1-2 x/day, 30 to 90 minutes SLP Frequency: 3 to 5 out of 7 days SLP Duration/Estimated Length of Stay: 3.5-4 weeks   Due to the current  state of emergency, patients may not be receiving their 3-hours of Medicare-mandated therapy.   Team Interventions: Nursing Interventions Patient/Family Education, Pain Management, Bladder Management, Medication Management, Discharge Planning, Dysphagia/Aspiration Precaution Training, Bowel Management, Disease Management/Prevention  PT interventions Ambulation/gait training, Balance/vestibular training, Cognitive remediation/compensation, Disease management/prevention, Functional mobility training, Therapeutic Exercise, Visual/perceptual remediation/compensation, Splinting/orthotics, Patient/family education, Discharge planning, Functional electrical stimulation, Pain management, Skin care/wound management, Therapeutic Activities, UE/LE Coordination activities, Wheelchair propulsion/positioning, Stair training, UE/LE Strength taining/ROM, Psychosocial support, Neuromuscular re-education, DME/adaptive equipment instruction, Community reintegration  OT Interventions Training and development officer, Engineer, drilling, Patient/family education, Therapeutic Activities, Wheelchair propulsion/positioning, Therapeutic Exercise, Psychosocial support, Functional electrical stimulation, Cognitive remediation/compensation, Community reintegration, Functional mobility training, Self Care/advanced ADL retraining, UE/LE Strength taining/ROM, UE/LE Coordination activities, Neuromuscular re-education, Discharge planning, Disease mangement/prevention, Pain management, Splinting/orthotics, Visual/perceptual remediation/compensation  SLP Interventions Cognitive remediation/compensation, Dysphagia/aspiration precaution training, Internal/external aids, Speech/Language facilitation, Therapeutic Activities, Environmental controls, Cueing hierarchy, Functional tasks, Patient/family education  TR Interventions    SW/CM Interventions Discharge Planning, Psychosocial Support, Patient/Family Education   Barriers to  Discharge MD  Medical stability  Nursing      PT Inaccessible home environment, Nutrition means, Pending chemo/radiation    OT Medical stability, Incontinence, Lack of/limited family support    SLP      SW       Team Discharge Planning: Destination: PT-Home ,OT- Home , SLP-Home Projected Follow-up: PT-Home health PT, OT-  Home health OT, SLP-Home Health SLP, 24 hour supervision/assistance Projected Equipment Needs: PT-To be determined, OT- To be determined, SLP-To  be determined Equipment Details: PT- , OT-  Patient/family involved in discharge planning: PT- Patient,  OT-Patient, SLP-Patient  MD ELOS: 24-28 days Medical Rehab Prognosis:  Good Assessment: The patient has been admitted for CIR therapies with the diagnosis of right MCA infarct. The team will be addressing functional mobility, strength, stamina, balance, safety, adaptive techniques and equipment, self-care, bowel and bladder mgt, patient and caregiver education, NMR, communication, cognition, swallowing, spasticity, pain. Goals have been set at mod assist for self-care and mod to max assist for transfers and mobility, mod assist for cognition.   Due to the current state of emergency, patients may not be receiving their 3 hours per day of Medicare-mandated therapy.    [name], MD, FAAPMR      See Team Conference Notes for weekly updates to the plan of care

## 2019-07-13 NOTE — Progress Notes (Signed)
Speech Language Pathology Daily Session Note  Patient Details  Name: Kyle Garcia MRN: [id number] Date of Birth: 17-Jan-1951  Today's Date: 07/13/2019 SLP Individual Time: 0930-1025 SLP Individual Time Calculation (min): 55 min  Short Term Goals: Week 1: SLP Short Term Goal 1 (Week 1): Patient will consume current diet with minimal overt s/s of aspiration and Mod verbal cues for use of swallowing compensatory strategies. SLP Short Term Goal 2 (Week 1): Patient will verbally answer open-ended questions in 50% of opportunities with Max A multimodal cues. SLP Short Term Goal 3 (Week 1): Patient will demonstrate sustained attention to functional tasks for 5 minutes with Max A verbal cues for redirection. SLP Short Term Goal 4 (Week 1): Patient will initiate functional tasks with Max A multimodal cues.  Skilled Therapeutic Interventions: Skilled treatment session focused on cognitive goals. Upon arrival, patient had independently utilized the call bell and requested to use the bathroom. The patient was transferred back to bed via the [facility] with total +2 assist needed for safety. SLP facilitated session by providing Max-Total A multimodal cues for problem solving and safety with task. Once in bed, patient reported he no longer needed to use the bedpan, therefore, the patient was transferred back to the tilt-in-space wheelchair via the [facility]. SLP facilitated session by providing a snack of Dys. 2 textures with honey-thick liquids. Patient demonstrated prolonged AP transit with Mod A verbal cues needed for swallow initiation at times resulting in cough X 2. Recommend patient continue current diet with minimal distractions. Throughout snack, patient was unable to turn to his head past midline to locate items or clinician in left visual field. SLP also facilitated session by providing constant verbal cues for recall and awareness in regards to environmental orientation. Patient frequently talking to his  family members who were not present and requesting to find his dog. Patient left upright in tilt-in-space in wheelchair with alarm on and all needs within reach. Continue with current plan of care.      Pain Intermittent pain with bed mobility   Therapy/Group: Individual Therapy  [name] 07/13/2019, 12:45 PM

## 2019-07-14 ENCOUNTER — Inpatient Hospital Stay (HOSPITAL_COMMUNITY): Payer: Medicare Other | Admitting: Physical Therapy

## 2019-07-14 ENCOUNTER — Inpatient Hospital Stay (HOSPITAL_COMMUNITY): Payer: Medicare Other | Admitting: Occupational Therapy

## 2019-07-14 ENCOUNTER — Telehealth: Payer: Self-pay | Admitting: *Deleted

## 2019-07-14 ENCOUNTER — Telehealth: Payer: Self-pay | Admitting: Internal Medicine

## 2019-07-14 ENCOUNTER — Inpatient Hospital Stay (HOSPITAL_COMMUNITY): Payer: Medicare Other | Admitting: Speech Pathology

## 2019-07-14 LAB — CULTURE, BLOOD (ROUTINE X 2)
Culture: NO GROWTH
Culture: NO GROWTH
Special Requests: ADEQUATE

## 2019-07-14 MED ORDER — METHYLPHENIDATE HCL 5 MG PO TABS
2.5000 mg | ORAL_TABLET | Freq: Two times a day (BID) | ORAL | Status: DC
  Administered 2019-07-15 – 2019-07-16 (×3): 2.5 mg via ORAL
  Filled 2019-07-14 (×3): qty 1

## 2019-07-14 NOTE — Progress Notes (Signed)
[place] PHYSICAL MEDICINE & REHABILITATION PROGRESS NOTE   Subjective/Complaints:  In bed. Had a reasonable night. Low back pain  ROS: Limited due to cognitive/behavioral    Objective:   No results found. Recent Labs    07/13/19 0733  WBC 8.3  HGB 9.2*  HCT 29.1*  PLT 206   Recent Labs    07/13/19 0733  NA 136  K 4.1  CL 103  CO2 25  GLUCOSE 108*  BUN 8  CREATININE 0.70  CALCIUM 8.0*    Intake/Output Summary (Last 24 hours) at 07/14/2019 1033 Last data filed at 07/14/2019 0828 Gross per 24 hour  Intake 330 ml  Output -  Net 330 ml     Physical Exam: Vital Signs Blood pressure 120/65, pulse (!) 107, temperature 98.2 F (36.8 C), resp. rate 17, weight 63.4 kg, SpO2 99 %.   Constitutional: No distress . Vital signs reviewed. HEENT: EOMI, oral membranes moist Neck: supple Cardiovascular: RRR without murmur. No JVD    Respiratory: CTA Bilaterally without wheezes or rales. Normal effort    GI: BS +, non-tender, non-distended   Extremities: No clubbing, cyanosis, or edema Skin: No evidence of breakdown, no evidence of rash Neurologic: alert, follows basic commands. Distracted. Cranial nerves II through XII intact, motor strength is 5/5 in Right and 0/5 left  deltoid, bicep, tricep, grip, hip flexor, knee extensors, ankle dorsiflexor and plantar flexor +triple flexor response in LLE still present.  Sensory exam senses pain LUE and LLE  Musculoskeletal: no joint swelling   Assessment/Plan: 1. Functional deficits secondary to Right MCA infarct with dysphagia  which require 3+ hours per day of interdisciplinary therapy in a comprehensive inpatient rehab setting.  Physiatrist is providing close team supervision and 24 hour management of active medical problems listed below.  Physiatrist and rehab team continue to assess barriers to discharge/monitor patient progress toward functional and medical goals  Care Tool:  Bathing        Body parts bathed by  helper: Right arm, Left arm, Chest, Abdomen, Front perineal area, Buttocks, Right upper leg, Left upper leg, Right lower leg, Left lower leg, Face     Bathing assist Assist Level: 2 Helpers     Upper Body Dressing/Undressing Upper body dressing   What is the patient wearing?: Pull over shirt    Upper body assist Assist Level: Total Assistance - Patient < 25%    Lower Body Dressing/Undressing Lower body dressing      What is the patient wearing?: Pants, Incontinence brief     Lower body assist Assist for lower body dressing: Dependent - Patient 0%     Toileting Toileting    Toileting assist Assist for toileting: Dependent - Patient 0%     Transfers Chair/bed transfer  Transfers assist     Chair/bed transfer assist level: Dependent - mechanical lift     Locomotion Ambulation   Ambulation assist   Ambulation activity did not occur: Safety/medical concerns          Walk 10 feet activity   Assist  Walk 10 feet activity did not occur: Safety/medical concerns        Walk 50 feet activity   Assist Walk 50 feet with 2 turns activity did not occur: Safety/medical concerns         Walk 150 feet activity   Assist Walk 150 feet activity did not occur: Safety/medical concerns         Walk 10 feet on uneven surface  activity  Assist Walk 10 feet on uneven surfaces activity did not occur: Safety/medical concerns         Wheelchair     Assist Will patient use wheelchair at discharge?: Yes Type of Wheelchair: Manual    Wheelchair assist level: Dependent - Patient 0%(TIS w/c) Max wheelchair distance: 150 ft    Wheelchair 50 feet with 2 turns activity    Assist        Assist Level: Dependent - Patient 0%   Wheelchair 150 feet activity     Assist     Assist Level: Dependent - Patient 0%    Medical Problem List and Plan: 1.Dense lefthemiparesiswith dysphagiasecondary to embolic shower right MCA occlusion status  post reperfusion. Infarct felt to be embolic secondary to hypercoagulable state from lung cancer -CIR PT, OT,  SLP  Team conf today  -PRAFO, WHO Left side 2. Antithrombotics: -DVT/anticoagulation/history of right lower extremity VXB:[id number] -antiplatelet therapy: N/A 3. Pain Management:Tylenol as needed  -antispasmodics 4. Mood:Provide emotional support -antipsychotic agents: N/A 5. Neuropsych: This patientiscapable of making decisions on hisown behalf. 6. Skin/Wound Care:Routine skin checks 7. Fluids/Electrolytes/Nutrition:   -continue IVF  -encourage PO 8.Stage IV adenocarcinoma right lung. Chemotherapy as per [name] 9. Aspiration pneumonia. Continue Unasyn initiated 07/08/2019 x 5-day course. Afebrile, WBC 8.3  -aspiration precautions 10. Dysphagia. Dysphasia #2 honey thick liquids.   -advance diet per SLP  -IVF HS 11. Hyperlipidemia. Crestor 12. Remote tobacco abuse. Counseling 13.Acute on chronic anemia. Hgb slightly up at 8.3 will cont to monitor   14 Hypocalcemia- sl improved, still low  -ongoing observation  -supplementation  -likely related to #8  -recheck later this week  LOS: 4 days A FACE TO [place] 07/14/2019, 10:33 AM

## 2019-07-14 NOTE — Telephone Encounter (Signed)
Kyle Garcia is scheduled for labs and Cassie and treatment on 07/29/19. Does he need labs done prior to that date?

## 2019-07-14 NOTE — Progress Notes (Signed)
Physical Therapy Session Note  Patient Details  Name: Kyle Garcia MRN: [id number] Date of Birth: 07/16/1951  Today's Date: 07/14/2019 PT Individual Time: 2979-8921 and 1404-1430 and 1445-1528 PT Individual Time Calculation (min): 61 min and 26 min and 43 min   Short Term Goals: Week 1:  PT Short Term Goal 1 (Week 1): Pt will maintain sitting balance with CGA PT Short Term Goal 2 (Week 1): Pt will perform bed<>chair transfer with max assist +1 PT Short Term Goal 3 (Week 1): Pt will perform sit<>stand with max assist +2  Skilled Therapeutic Interventions/Progress Updates:  Treatment 1: Pt received in bed & observed to be incontinent of urine & BM & pt unaware. Pt with limited to no participation in rolling L<>R, with pt with c/o of pain with rolling & rest breaks & repositioning provided. Pt requires +2 for rolling L<>R to allow therapist to perform peri hygiene & donning clean brief & pants dependent assist. Instructed pt in bridging with therapist stabilizing BLE but pt unable to complete bridge. Pt transferred bed>w/c and w/c<>mat table with maximove +2 assist. Transported pt to/from gym via w/c dependent assist and transferred pt to sitting on mat table. While sitting on EOM pt requires up to max/total assist for sitting balance as pt with impaired awareness regarding L lateral LOB and pt unable to hold head up as it falls to the L. Attempted to use mirror for visual feedback but pt with very poor ability to locate mirror in front of him despite multimodal cuing. Pt engaged in reaching for objects to R with hand over hand assist, with pt able to obtain bean bag one time without assistance, with task focusing on weight shifting R to correct L LOB for midline orientation. Pt fatigued throughout session requiring max cuing/encouragement for engagement. Adjusted pt's TIS w/c, applying washcloths to ELRs for cushions to prevent pressure points & skin breakdown, positioned towel roll on L side,  significantly reclined pt so his head could rest on headrest, and provided pt with full lap tray to increase positioning and promote OOB tolerance. At end of session pt left in TIS w/c with chair alarm donned & call bell in lap.   Treatment 2: Pt received in w/c. Pt with c/o pain with movement but does not specify where and rest breaks/repositioning provided for pain relief. Pt not positioned properly in w/c and pt unaware. Pt transported room<>dayroom via w/c dependent assist. Pt unable to reposition himself in chair as pt unable to recognize L LOB despite MAX multimodal cuing and therapist attempting to assist him with correcting. Attempted to use visual targets and multimodal cuing to correct posture with pt unable to follow one step simple commands. Pt requires +2 dependent assist for slide board downhill w/c<>mat table. While sitting on EOM pt requires up to max/total assist for midline posture and static sitting balance, with therapist attempting to have pt to reach R to correct L lateral lean/LOB with pt continuing to have difficulty following commands. Pt oriented to location only. At end of session pt left sitting in TIS w/c with chair alarm donned & call bell in lap.  Treatment 3: Pt received in w/c. Pt with c/o pain "all over" and in his back but does not rate & rest breaks & repositioning provided for relief. Pt transfers w/c<>mat table with +2 dependent assist via squat pivot. While sitting EOM attempted to have pt lean onto R elbow as pt pushes with RUE in sitting. Pt transitions to R sidelying>supine on  mat table with +2 assist. When pt transitions to R sidelying and later during session when turning head R to roll R pt with significant L beating nystagmus noted but pt denies feeling dizzy. While supine on mat table therapist assisted pt with flexing B hip/knees 2/2 pain reported in back. Attempted to extend BLE and it appears as pt somewhat volitionally moves BLE. Transitioned to L sidelying and  attempted to have pt track midline with eyes to locate visual targets in front of him, then track R to place targets overhead. Attempted to assist pt into prone but pt unable to tolerate 2/2 pain. Returned to sitting EOM & pt requires multimodal cuing for midline orientation & sitting balance. Pt transfers sit<>stand with +2 assist with therapist blocking BLE knees to prevent pushing with RLE & buckling LLE. Assisted pt with taking 2 steps with each LE with total assist for advancing & stabilizing LLE. Pt returned to chair and back to room. Pt thought to be incontinent of bowels so assisted w/c>bed with +2 dependent assist slide board & +2 total assist sit>supine. Pt left in bed in care of NT. Pt oriented to location & situation (stroke) during this session.   Therapy Documentation Precautions:  Precautions Precautions: Fall Precaution Comments: Dense Lt hemi, pusher Restrictions Weight Bearing Restrictions: No    Therapy/Group: Individual Therapy  [name] 07/14/2019, 3:43 PM

## 2019-07-14 NOTE — Telephone Encounter (Signed)
Yes. He was supposed to receive treatment on 7/15.

## 2019-07-14 NOTE — Telephone Encounter (Signed)
Scheduled appt per 7/21 sch message - pt aware of appt but very confused . Called next desk to let her know of appt change RN pamela aware.

## 2019-07-14 NOTE — Telephone Encounter (Signed)
Received call from pt's wife, [name]. She states that patient is in rehab after having a CVA last week. She is asking about upcoming appts-MRI Brain and neck on Friday, 07/17/19 and other lab appts, MD appts with Dr. Julien Nordmann and Dr. Mickeal Skinner and also his treatment schedule. His last treatment was on 06/17/19.  Please advise.

## 2019-07-14 NOTE — Patient Care Conference (Signed)
Inpatient RehabilitationTeam Conference and Plan of Care Update Date: 07/14/2019   Time: 2:40 PM    Patient Name: Kyle Garcia      Medical Record Number: [id number]  Date of Birth: 12/13/1951 Sex: Male         Room/Bed: 4W20C/4W20C-01 Payor Info: Payor: MEDICARE / Plan: MEDICARE PART A AND B / Product Type: *No Product type* /    Admitting Diagnosis: 2. TBI Team  RT. CVA; 22-24days  Admit Date/Time:  07/10/2019  4:34 PM Admission Comments: No comment available   Primary Diagnosis:  <principal problem not specified> Principal Problem: <principal problem not specified>  Patient Active Problem List   Diagnosis Date Noted  . Hx of ischemic multifocal multiple vascular territories stroke 07/10/2019  . Dysphagia due to recent cerebrovascular accident 07/10/2019  . Fever 07/10/2019  . Right middle cerebral artery stroke ([place]) 07/10/2019  . Acute right arterial ischemic stroke, middle cerebral artery (MCA) ([place]) s/p mechanical thrombectomy 07/03/2019  . Middle cerebral artery embolism, right 07/03/2019  . CAP (community acquired pneumonia) 06/20/2019  . Stroke ([place])   . Stroke due to embolism ([place]) 06/17/2019  . Encounter for antineoplastic chemotherapy 06/05/2019  . Encounter for antineoplastic immunotherapy 06/05/2019  . Goals of care, counseling/discussion 06/05/2019  . Adenocarcinoma of right lung, stage 4 ([place]) 06/04/2019  . Cough 05/21/2019  . S/P bronchoscopy with biopsy   . DVT (deep venous thrombosis) ([place]) 05/08/2019  . Weight loss 04/28/2019  . Aspiration pneumonia ([place]) 04/10/2019  . GERD without esophagitis 11/14/2015  . Hyperlipidemia 11/14/2015  . Benign prostatic hyperplasia 12/24/2008    Expected Discharge Date: Expected Discharge Date: (3-4 weeks)  Team Members Present: Physician leading conference: Dr. Alger Simons Social Worker Present: Lennart Pall, LCSW Nurse Present: Other (comment)(Daniel Russella Dar, RN) PT Present: Lavone Nian, PT OT Present: Willeen Cass, OT SLP Present: Weston Anna, SLP PPS Coordinator present : Gunnar Fusi, SLP     Current Status/Progress Goal Weekly Team Focus  Medical   Right MCA infarct with spastic left hemiparesis. assocaited pain. urinary incontinence. +dsyphagia. receiving IVF for nutritional support/fluids  improve posture and activity tolerance  nutrition, pain control, spasticity mgt.   Bowel/Bladder   Pt is continent of B/B, but able to inform staff when the adult diaper is soiled. LBM 07/13/19  Pt will be able to regain continence of B/B.  Q2h/PRN  toileting, PVR after voiding in diaper.   Swallow/Nutrition/ Hydration   Dys. 1 textures with honey-thick liquids, Max A  Min A  attention to task, increased use of swallowing strategies   ADL's   total A +2 for all  functional mobility and ADLs  mod A overall  focused to sustained attention, postural control, orientation, trunk control, following one step commands. L NMR   Mobility   +2 total assist for bed mobility & transfers to TIS w/c, impaired cognition  min assist bed mobility, mod assist transfers, max assist gait x 10 ft with therapy, supervision w/c mobility  bed mobility, transfers, L NMR, cognitive remediation, activity/OOB tolerance, awareness   Communication   Max A  Min A  following commands, expression of wants/needs   Safety/Cognition/ Behavioral Observations  Max-Total A  Min A  arousal, attention, initiation   Pain   pt c/o of 7-10/10 pain. increased pain with movement.  Pt pain will be <3  Assess pain Qshift/ PRN, address needs and medicate as needed.   Skin   no ibvi9ous signs of skin breakdown. MASD to peri area, barrier cream applied  after peri care.  Keep skin free of infection and prevent further breakdown.  Assess skin Qshift/PRN, address needs as needed. Q2h position change, pillows b/w knees.    Rehab Goals Patient on target to meet rehab goals: Yes *See Care Plan and progress notes for long and short-term goals.      Barriers to Discharge  Current Status/Progress Possible Resolutions Date Resolved   Physician    Medical stability;Other (comments)        ongoing integrated therapy to improve posture, left sided attention.      Nursing                  PT  Inaccessible home environment;Nutrition means;Pending chemo/radiation                 OT Medical stability;Incontinence;Lack of/limited family support                SLP                SW                Discharge Planning/Teaching Needs:  Pt to d/c home with wife as primary caregiver with intermittent assist of adult children.  Concern if pt's physical assist needs are too great as wife states, "I'm not a big person."  Teaching needs TBD closer to d/c.   Team Discussion: very impaired gentleman with multiple medical issues.  MD to follow up with onc to confirm treatment plans.  Poor postural control and this can easily affect swallow safety.  Seeing evidence of nystagmus in tx session.  Maxi-move with transfers;  Cannot hold head up.  Total assist for attention.  Orientation varies.  Goals currently set for mod assist w/c level.  Revisions to Treatment Plan:  NA    Continued Need for Acute Rehabilitation Level of Care: The patient requires daily medical management by a physician with specialized training in physical medicine and rehabilitation for the following conditions: Daily direction of a multidisciplinary physical rehabilitation program to ensure safe treatment while eliciting the highest outcome that is of practical value to the patient.: Yes Daily medical management of patient stability for increased activity during participation in an intensive rehabilitation regime.: Yes Daily analysis of laboratory values and/or radiology reports with any subsequent need for medication adjustment of medical intervention for : Neurological problems;Mood/behavior problems;Nutritional problems   I attest that I was present, lead the team conference, and  concur with the assessment and plan of the team.   Lennart Pall 07/14/2019, 3:18 PM   Team conference was held via web/ teleconference due to [place] - 19

## 2019-07-14 NOTE — Progress Notes (Signed)
Occupational Therapy Session Note  Patient Details  Name: Kyle Garcia MRN: [id number] Date of Birth: 1951/01/10  Today's Date: 07/14/2019 OT Individual Time: 1100-1155 OT Individual Time Calculation (min): 55 min    Short Term Goals: Week 1:  OT Short Term Goal 1 (Week 1): Pt will complete 1 grooming task EOB with 1 assist for sitting balance OT Short Term Goal 2 (Week 1): Pt will initiate washing affected arm with max vcs OT Short Term Goal 3 (Week 1): Pt will exhibit improved sustained attention by initiating placing 1 limb into UB garment      Skilled Therapeutic Interventions/Progress Updates:    Pt seen this session to focus on attention, L side scanning, and awareness of posture.  Pt received in TIS w/c with lap tray on.  Worked on scanning, neck PROM, positioning in w.c, hand over hand guidance.  Pt needs TOTAL A with all tasks as he as very limited visual skills (eyes do not cross midline to L, occasional nystagmus L beating without positional changes), severe L neglect, max impaired praxis, and poor attention. Pt disoriented.  He was able to engage in short conversation about his kids and his hobbies, but quickly gets distracted.   Pt reclined in wc, belt alarm on, lap tray in place, call light on lap.   Therapy Documentation Precautions:  Precautions Precautions: Fall Precaution Comments: Dense Lt hemi, pusher Restrictions Weight Bearing Restrictions: No    Vital Signs: Therapy Vitals Temp: 98.2 F (36.8 C) Pulse Rate: (!) 107 Resp: 17 BP: 120/65 Patient Position (if appropriate): Lying Oxygen Therapy SpO2: 99 % O2 Device: Room Air Pain: Pain Assessment Pain Scale: 0-10 Pain Score: 7  Pain Type: Chronic pain Pain Location: Back Pain Descriptors / Indicators: Aching Patients Stated Pain Goal: 5 Pain Intervention(s): Medication (See eMAR)   Therapy/Group: Individual Therapy  [place] 07/14/2019, 8:51 AM

## 2019-07-14 NOTE — Telephone Encounter (Signed)
He needs to keep his appointments. I think Dr. Mickeal Skinner is the one who ordered the MRI. He needs to see [place] next week either me or Cassie.

## 2019-07-14 NOTE — Telephone Encounter (Signed)
He did not receive treatment on 07/08/19 as he was hospitalized on 07/03/19 for the CVA. Last labs done 07/13/19 in rehab.

## 2019-07-14 NOTE — Progress Notes (Signed)
Speech Language Pathology Daily Session Note  Patient Details  Name: Kyle Garcia MRN: [id number] Date of Birth: 18-Oct-1951  Today's Date: 07/14/2019 SLP Individual Time: 4158-3094 SLP Individual Time Calculation (min): 45 min  Short Term Goals: Week 1: SLP Short Term Goal 1 (Week 1): Patient will consume current diet with minimal overt s/s of aspiration and Mod verbal cues for use of swallowing compensatory strategies. SLP Short Term Goal 2 (Week 1): Patient will verbally answer open-ended questions in 50% of opportunities with Max A multimodal cues. SLP Short Term Goal 3 (Week 1): Patient will demonstrate sustained attention to functional tasks for 5 minutes with Max A verbal cues for redirection. SLP Short Term Goal 4 (Week 1): Patient will initiate functional tasks with Max A multimodal cues.  Skilled Therapeutic Interventions: Skilled treatment session focused on cognitive and dysphagia goals. Patient consumed breakfast meal of Dys. 2 textures with honey-thick liquids with minimal overt s/s of aspiration with Mod verbal cues needed to clear left buccal pocketing and anterior spillage. Hand over hand assist was also needed for self-feeding with his RUE. Patient required Max A verbal cues for arousal throuhgout meal and was frequently falling asleep while masticating. SLP utilized pictures in order to facilitate scanning to midline in which the patient was unable to do despite total A multimodal cues.  Throughout session, patient labile with language of confusion. Patient left upright in bed with alarm on and all needs within reach. Continue with current plan of care.      Pain No/Denies Pain   Therapy/Group: Individual Therapy  Bevin Das 07/14/2019, 12:37 PM

## 2019-07-15 ENCOUNTER — Inpatient Hospital Stay (HOSPITAL_COMMUNITY): Payer: Medicare Other | Admitting: Occupational Therapy

## 2019-07-15 ENCOUNTER — Inpatient Hospital Stay: Payer: Medicare Other

## 2019-07-15 ENCOUNTER — Inpatient Hospital Stay (HOSPITAL_COMMUNITY): Payer: Medicare Other

## 2019-07-15 ENCOUNTER — Inpatient Hospital Stay (HOSPITAL_COMMUNITY): Payer: Medicare Other | Admitting: Physical Therapy

## 2019-07-15 ENCOUNTER — Ambulatory Visit (HOSPITAL_COMMUNITY): Payer: Medicare Other | Admitting: Speech Pathology

## 2019-07-15 MED ORDER — BACLOFEN 5 MG HALF TABLET
5.0000 mg | ORAL_TABLET | Freq: Two times a day (BID) | ORAL | Status: DC
  Administered 2019-07-15 – 2019-07-22 (×15): 5 mg via ORAL
  Filled 2019-07-15 (×15): qty 1

## 2019-07-15 NOTE — Progress Notes (Signed)
Occupational Therapy Session Note  Patient Details  Name: Fairley Copher MRN: [id number] Date of Birth: 1951/11/29  Today's Date: 07/15/2019 OT Individual Time: 5329-9242 and 1130-1200 OT Individual Time Calculation (min): 30 min  And 30 min   Short Term Goals: Week 1:  OT Short Term Goal 1 (Week 1): Pt will complete 1 grooming task EOB with 1 assist for sitting balance OT Short Term Goal 2 (Week 1): Pt will initiate washing affected arm with max vcs OT Short Term Goal 3 (Week 1): Pt will exhibit improved sustained attention by initiating placing 1 limb into UB garment  Skilled Therapeutic Interventions/Progress Updates:    Visit 1: pain: "I know it is going to hurt and I am going to scream!" but pt never did during session. Although he did say neck was bothering him.   Pt seen this session to facilitate attention, L visual scanning, awareness during self care tasks. Pt worked on rolling in bed with max A  With max cues.  Pt was keeping his head turned to the L today but his eyes were focused to the R.  Pt had short moments of attention in which he did wash face, chest, abdomen with frequent redirection.  He continues to need total A with dressing as he can not attend long enough and he has severe VP deficits.   Pt labile several times in session talking about his family. Pt resting in bed with alarm on and all needs met.    Visit 2:  Pain: pt unable to identify specific pain, but everytime I tried to passively extend L leg, he would pull it back into knee/ hip flexion and say it hurt Pt seen this session to facilitate L scanning and L side awareness.  Pt received in bed.  Worked on PROM to LUE with scapular glides.  Ext rot and distal arm all WNL.  No c/o pain with ROM.  Placed pt's hands together in clasped position and had pt look at hands and passively moved his hands side to side for scanning, improved attention with this activity.  Pt did better when cued to count to 10 with each rep.    He did scan to L for a few brief seconds at a time. Hand over hand guiding of R hand to touch L arm.  Continues to have eye beating to the R. Pt was also labile this session when talking about family, things he enjoys.   Pt resting in bed with all needs met.  Bed alarm set and call light in place.    Therapy Documentation Precautions:  Precautions Precautions: Fall Precaution Comments: Dense Lt hemi, pusher Restrictions Weight Bearing Restrictions: No      Therapy/Group: Individual Therapy  [place] 07/15/2019, 8:26 AM

## 2019-07-15 NOTE — Progress Notes (Signed)
Physical Therapy Session Note  Patient Details  Name: Kyle Garcia MRN: [id number] Date of Birth: 12-20-1951  Today's Date: 07/15/2019 PT Co-Treatment Time: 1100-1126 PT Co-Treatment Time Calculation (min): 26 min  Short Term Goals: Week 1:  PT Short Term Goal 1 (Week 1): Pt will maintain sitting balance with CGA PT Short Term Goal 2 (Week 1): Pt will perform bed<>chair transfer with max assist +1 PT Short Term Goal 3 (Week 1): Pt will perform sit<>stand with max assist +2  Skilled Therapeutic Interventions/Progress Updates:  Pt seen for co-tx with SLP Loma Sousa) from 10:30-11:26 AM. Pt received in bed, pt with ongoing c/o pain "all over" and in his "back" with repositioning & rest breaks provided. Pt requires +2 assist overall for all movement. Pt transfers supine>R sidelying>sitting EOB with therapist providing facilitation for RUE & BLE placement with cuing to push through RUE with pt able to do so but once sitting EOB pt pushing L of midline with RUE & unaware. Pt requires +2 dependent assist for slide board transfer bed<>TIS w/c and w/c<>mat table with therapist attempting to have pt push with RUE but unable to follow commands to participate. Pt transferred onto mat in dayroom & requires max cuing to lean on RUE elbow and come to sidelying. Pt requires +2 for rolling L<>R and lays on L side and engages in task focusing on scanning & tracking to midline with eyes with pt requiring max cuing. R beating nystagmus observed in B eyes on this date & pt does report double vision with eye patch applied but pt with poor tolerance of it. Therapist attempted to move BLE to neutral hip alignment but pt unable to tolerate 2/2 pain. Pt transitioned to supine on mat table, progressing from bolster>pillow>no support under BLE in attempts to achieve neutral hip alignment but unsuccessful. Pt positioned in R sidelying for prolonged passive stretch to neck as pt maintains L lateral cervical flexion with pt able to  tolerate neutral positioning in R sidelying. At end of session pt left in bed in care of NT.   Therapy Documentation Precautions:  Precautions Precautions: Fall Precaution Comments: Dense Lt hemi, pusher Restrictions Weight Bearing Restrictions: No    Therapy/Group: Co-Treatment  [name] 07/15/2019, 12:22 PM

## 2019-07-15 NOTE — Progress Notes (Signed)
Speech Language Pathology Daily Session Note  Patient Details  Name: Kyle Garcia MRN: [id number] Date of Birth: 1951/02/28  Today's Date: 07/15/2019 SLP Co-Treatment Time: 1030-1100(co-tx with PT (VM) 1030-1130) SLP Co-Treatment Time Calculation (min): 30 min  Short Term Goals: Week 1: SLP Short Term Goal 1 (Week 1): Patient will consume current diet with minimal overt s/s of aspiration and Mod verbal cues for use of swallowing compensatory strategies. SLP Short Term Goal 2 (Week 1): Patient will verbally answer open-ended questions in 50% of opportunities with Max A multimodal cues. SLP Short Term Goal 3 (Week 1): Patient will demonstrate sustained attention to functional tasks for 5 minutes with Max A verbal cues for redirection. SLP Short Term Goal 4 (Week 1): Patient will initiate functional tasks with Max A multimodal cues.  Skilled Therapeutic Interventions: Skilled treatment session focused on cognitive goals. Patient transferred to the wheelchair via the slideboard with total +2 for safety with Max verbal and tactile cues needed to reduce pushing with RUE. Patient taken to the gym and transferred to the mat and participated in scanning and cognitive tasks while in sidelying.  Patient required Max verbal and tactile cues to scan and focus to midline and to identify family members in familiar pictures.  Patient with language of confusion and required Max verbal cues for sustained attention to tasks. Patient transferred back to bed at end of session and left with NT present. Continue with current plan of care.      Pain Intermittent pain during transfers, patient repositioned and RN aware.   Therapy/Group: Individual Therapy  Demba Nigh 07/15/2019, 4:05 PM

## 2019-07-15 NOTE — Progress Notes (Signed)
Physical Therapy Session Note  Patient Details  Name: Kyle Garcia MRN: [id number] Date of Birth: 10-27-1951  Today's Date: 07/15/2019 PT Individual Time: 0805-0900 PT Individual Time Calculation (min): 55 min   Short Term Goals: Week 1:  PT Short Term Goal 1 (Week 1): Pt will maintain sitting balance with CGA PT Short Term Goal 2 (Week 1): Pt will perform bed<>chair transfer with max assist +1 PT Short Term Goal 3 (Week 1): Pt will perform sit<>stand with max assist +2  Skilled Therapeutic Interventions/Progress Updates:  Pt received in bed with NT just cleaned pt up. Addressed tracking & focusing on objects with therapist showing pt his flowers in his room with pt able to locate them significantly R to midline, but pt unable to read card. Pt then requested to eat breakfast, so with pt positioned upright in bed and HOB elevated as much as possible therapist provided total assist for consuming breakfast. Pt requires max assist to locate utensils on tray to R of midline, dependent assist to pre load utensils, hand over hand assist for self feeding (pt with greater difficulty using spoon compared to fork), impaired coordination & ability to bring utensil to mouth, and spillage out of L side of mouth. Pt requires max cuing for small sips and to swallow bolus of food before taking another. Pt is able to bring cup to mouth with RUE after therapist places container in his hand. After eating pt participated in oral care with therapist providing assistance, pt even initiated cleaning L side of mouth with therapist providing total assist after pt to ensure clean mouth & no oral residue. Pt oriented to location & date (year & month), and situation ("stroke") on this date. At end of session pt left in bed with alarm set & call bell in lap.   Pt perseverating on speaking to someone named [place] during session and pt able to recognize when someone walks by door way in peripheral vision on R side so door closed  while eating breakfast to limit distractions.   Therapy Documentation Precautions:  Precautions Precautions: Fall Precaution Comments: Dense Lt hemi, pusher Restrictions Weight Bearing Restrictions: No    Therapy/Group: Individual Therapy  [name] 07/15/2019, 9:02 AM

## 2019-07-15 NOTE — Progress Notes (Signed)
Social Work Patient ID: Kyle Garcia, male   DOB: October 24, 1951, 68 y.o.   MRN: [id number]  Have reviewed team conference with pt's wife today who is aware of targeted LOS of 3-4 weeks and mod assist w/c level goals.  She notes the LOS is longer than originally anticipated.  After reviewed current LOF and anticipated assist needs for d/c, wife states, "His stroke was a lot worse than they thought at first, huh?"  Explained there is team concern over the care needs he may have and whether this will be a level that she can manage.  From our discussion, it seems that wife simply does not have a good idea of the severity of his deficits and feel she would benefit from a onsite visit here on CIR to meet with team members and see how he is doing.  Will also request that MD contact her via phone to provide medical update.  Talking with team about possibly having her here on Monday/ Tuesday next week.  Will keep all posted.  Cyrstal Leitz, LCSW

## 2019-07-15 NOTE — Progress Notes (Signed)
[place] PHYSICAL MEDICINE & REHABILITATION PROGRESS NOTE   Subjective/Complaints:  In bed. Had a reasonable night. Low back pain  ROS: Limited due to cognitive/behavioral    Objective:   No results found. Recent Labs    07/13/19 0733  WBC 8.3  HGB 9.2*  HCT 29.1*  PLT 206   Recent Labs    07/13/19 0733  NA 136  K 4.1  CL 103  CO2 25  GLUCOSE 108*  BUN 8  CREATININE 0.70  CALCIUM 8.0*    Intake/Output Summary (Last 24 hours) at 07/15/2019 1110 Last data filed at 07/15/2019 0900 Gross per 24 hour  Intake 780 ml  Output -  Net 780 ml     Physical Exam: Vital Signs Blood pressure 97/61, pulse (!) 101, temperature 99 F (37.2 C), resp. rate 19, weight 63.4 kg, SpO2 100 %.   General: No acute distress HEENT: EOMI, oral membranes moist Cards: reg rate  Chest: normal effort Abdomen: Soft, NT, ND Skin: dry, intact   Extremities: No clubbing, cyanosis, or edema Skin: No evidence of breakdown, no evidence of rash Neurologic: alert, left inatention follows basic commands. Distracted. Cranial nerves II through XII intact, motor strength is 5/5 in Right and 0/5 left  deltoid, bicep, tricep, grip, hip flexor, knee extensors, ankle dorsiflexor and plantar flexor +triple flexor response in LLE still present.  Sensory exam senses pain LUE and LLE  Musculoskeletal: no swelling   Assessment/Plan: 1. Functional deficits secondary to Right MCA infarct with dysphagia  which require 3+ hours per day of interdisciplinary therapy in a comprehensive inpatient rehab setting.  Physiatrist is providing close team supervision and 24 hour management of active medical problems listed below.  Physiatrist and rehab team continue to assess barriers to discharge/monitor patient progress toward functional and medical goals  Care Tool:  Bathing    Body parts bathed by patient: Chest, Abdomen, Face   Body parts bathed by helper: Right arm, Left arm, Front perineal area, Buttocks,  Right upper leg, Left upper leg, Right lower leg, Left lower leg     Bathing assist Assist Level: Maximal Assistance - Patient 24 - 49%     Upper Body Dressing/Undressing Upper body dressing   What is the patient wearing?: Pull over shirt    Upper body assist Assist Level: Total Assistance - Patient < 25%    Lower Body Dressing/Undressing Lower body dressing      What is the patient wearing?: Pants, Incontinence brief     Lower body assist Assist for lower body dressing: Dependent - Patient 0%     Toileting Toileting    Toileting assist Assist for toileting: Dependent - Patient 0%     Transfers Chair/bed transfer  Transfers assist     Chair/bed transfer assist level: 2 Helpers     Locomotion Ambulation   Ambulation assist   Ambulation activity did not occur: Safety/medical concerns          Walk 10 feet activity   Assist  Walk 10 feet activity did not occur: Safety/medical concerns        Walk 50 feet activity   Assist Walk 50 feet with 2 turns activity did not occur: Safety/medical concerns         Walk 150 feet activity   Assist Walk 150 feet activity did not occur: Safety/medical concerns         Walk 10 feet on uneven surface  activity   Assist Walk 10 feet on uneven surfaces activity  did not occur: Safety/medical concerns         Wheelchair     Assist Will patient use wheelchair at discharge?: Yes Type of Wheelchair: Manual    Wheelchair assist level: Dependent - Patient 0%(TIS w/c) Max wheelchair distance: 150 ft    Wheelchair 50 feet with 2 turns activity    Assist        Assist Level: Dependent - Patient 0%   Wheelchair 150 feet activity     Assist     Assist Level: Dependent - Patient 0%    Medical Problem List and Plan: 1.Dense lefthemiparesiswith dysphagiasecondary to embolic shower right MCA occlusion status post reperfusion. Infarct felt to be embolic secondary to  hypercoagulable state from lung cancer -CIR PT, OT,  SLP   -PRAFO, WHO Left side  -significant visual-spatial deficits to left 2. Antithrombotics: -DVT/anticoagulation/history of right lower extremity GYJ:EHUDJSH -antiplatelet therapy: N/A 3. Pain Management:Tylenol as needed  -antispasmodics   -begin scheduled baclofen 4. Mood:Provide emotional support -antipsychotic agents: N/A  -ritalin trial to improve arousal and attention 2.5mg  bid, watch HR 5. Neuropsych: This patientiscapable of making decisions on hisown behalf. 6. Skin/Wound Care:Routine skin checks 7. Fluids/Electrolytes/Nutrition:   -continue IVF  -encourage PO, check labs friday 8.Stage IV adenocarcinoma right lung. Chemotherapy as per [name] 9. Aspiration pneumonia. Continue Unasyn initiated 07/08/2019 x 5-day course. Afebrile, WBC 8.3  -aspiration precautions 10. Dysphagia. Dysphasia #2 honey thick liquids.   -advance diet per SLP  -IVF HS  -check labs friday 11. Hyperlipidemia. Crestor 12. Remote tobacco abuse. Counseling 13.Acute on chronic anemia. Hgb slightly up at 8.3 will cont to monitor   14 Hypocalcemia- sl improved, still low  -ongoing observation  -supplementation  -likely related to #8  -recheck friday  LOS: 5 days A FACE TO FACE EVALUATION WAS PERFORMED  [name] 07/15/2019, 11:10 AM

## 2019-07-15 NOTE — Progress Notes (Signed)
Occupational Therapy Session Note  Patient Details  Name: Kyle Garcia MRN: [id number] Date of Birth: 1951/12/19  Today's Date: 07/15/2019 OT Individual Time: 1415-1500 OT Individual Time Calculation (min): 45 min    Short Term Goals: Week 1:  OT Short Term Goal 1 (Week 1): Pt will complete 1 grooming task EOB with 1 assist for sitting balance OT Short Term Goal 2 (Week 1): Pt will initiate washing affected arm with max vcs OT Short Term Goal 3 (Week 1): Pt will exhibit improved sustained attention by initiating placing 1 limb into UB garment  Skilled Therapeutic Interventions/Progress Updates:    1:1. Pt received in bed with no initial report of pain, however with rolling pt reporting pain in L hip. Pt completes supine>sitting EOB with dependent of 2 and slide board transfers dependent of 2 with manual facilitation of hand placement to help push pt across board. Attempted to engage pt to sort 2 color checkers onto matching squares on vertical board R of midline. Pt unable to attend to task and requires HOH A to reach and place checkers on board. SBT back to bed as stated above and total A to roll B to change soiled brief. Exited session with pt seated in bed, exit alarm on and call light in reach  Therapy Documentation Precautions:  Precautions Precautions: Fall Precaution Comments: Dense Lt hemi, pusher Restrictions Weight Bearing Restrictions: No General:   Vital Signs:  Pain:   ADL: ADL Eating: Not assessed Grooming: Dependent(washing hands) Where Assessed-Grooming: Bed level Upper Body Bathing: Dependent Where Assessed-Upper Body Bathing: Bed level Lower Body Bathing: Dependent Where Assessed-Lower Body Bathing: Bed level Upper Body Dressing: Dependent Where Assessed-Upper Body Dressing: Bed level Lower Body Dressing: Dependent Where Assessed-Lower Body Dressing: Bed level Toileting: Dependent Where Assessed-Toileting: Bed level Toilet Transfer: Not  assessed Tub/Shower Transfer: Not assessed Vision   Perception    Praxis   Exercises:   Other Treatments:     Therapy/Group: Individual Therapy  [name] 07/15/2019, 3:00 PM

## 2019-07-16 ENCOUNTER — Inpatient Hospital Stay (HOSPITAL_COMMUNITY): Payer: Medicare Other | Admitting: Speech Pathology

## 2019-07-16 ENCOUNTER — Inpatient Hospital Stay (HOSPITAL_COMMUNITY): Payer: Medicare Other | Admitting: Occupational Therapy

## 2019-07-16 ENCOUNTER — Inpatient Hospital Stay (HOSPITAL_COMMUNITY): Payer: Medicare Other | Admitting: Physical Therapy

## 2019-07-16 MED ORDER — METHYLPHENIDATE HCL 5 MG PO TABS
5.0000 mg | ORAL_TABLET | Freq: Two times a day (BID) | ORAL | Status: DC
  Administered 2019-07-16 – 2019-07-22 (×12): 5 mg via ORAL
  Filled 2019-07-16 (×13): qty 1

## 2019-07-16 NOTE — Progress Notes (Signed)
Physical Therapy Session Note  Patient Details  Name: Kyle Garcia MRN: [id number] Date of Birth: Jan 21, 1951  Today's Date: 07/16/2019 PT Individual Time: 4098-1191 PT Individual Time Calculation (min): 72 min   Short Term Goals: Week 1:  PT Short Term Goal 1 (Week 1): Pt will maintain sitting balance with CGA PT Short Term Goal 2 (Week 1): Pt will perform bed<>chair transfer with max assist +1 PT Short Term Goal 3 (Week 1): Pt will perform sit<>stand with max assist +2  Skilled Therapeutic Interventions/Progress Updates:    Pt received supine in bed and with encouragement agreeable to therapy session. Pt noted to have significant L LE flexor tone therefore therapist performed PROM hip/knee flexion/extension with prolonged stretch into extension for tone management - pt reports discomfort with this that decreased with increased repetitions. Rolling R/L in bed focusing on L attention and neuro re-ed with trunk rotation and sequencing of task with max/total assist for LE management and rolling - max multimodal cuing for sequencing. Supine>sit on EOB with +2 total assist despite cuing for sequencing to increase pt participation he was unable to assist due to poor awareness of positioning/upright and pain from LE tone. Required max assist of 1 and min assist of 2nd person for static sitting balance EOB due to pt pushing with R UE causing L lateral LOB. Patient's meal tray arriving. Slide board transfer EOB>w/c with +2 total assist for trunk control, pivoting hips, and LE management with max multimodal cuing for sequencing and manual facilitation for placement - pt demonstrates pushing with R hemibody during transfer. Therapist assisted with meal focusing on L attention, visual scanning, and R UE coordination to grasp item and bring towards mouth via hand-over-hand assist - cuing throughout for small bites and no aspiration noted. Performed hand hygiene at sink sitting in w/c with max assist. Slide board  transfer w/c>EOB with +2 total assist for trunk control, pivoting hips, and LE management/knee guarding - continues to demonstrate pushing with R UE. Performed sitting balance tasks on EOB with focus on midline orientation, head in midline as pt demonstrates L lateral neck flexion with R cervical rotation, decreased R hemibody pushing, and visual attention/scanning to midline due to strong R gaze preference - varying from min to total assist throughout to prevent L lateral LOB - performed repeated R lateral trunk lean onto forearm support with return to midline and pt continued to over push with R UE causing L lateral LOB with initially no awareness or recovery response progressed to minimal awareness/response with max multimodal cuing and continues to require total assist to return to midline. Sit>supine with +2 total assist for trunk descent and LB management. Pt left supine in bed with needs in reach and bed alarm on.   Therapy Documentation Precautions:  Precautions Precautions: Fall Precaution Comments: Dense Lt hemi, pusher Restrictions Weight Bearing Restrictions: No  Pain: Reports pain primarily in B LEs (L>R) due to tone and in L shoulder - repositioned and provided rest breaks throughout for pain management.    Therapy/Group: Individual Therapy  Tawana Scale, PT, DPT 07/16/2019, 1:04 PM

## 2019-07-16 NOTE — Plan of Care (Signed)
  Problem: Consults Goal: RH STROKE PATIENT EDUCATION Description: See Patient Education module for education specifics  Outcome: Progressing   Problem: RH SAFETY Goal: RH STG ADHERE TO SAFETY PRECAUTIONS W/ASSISTANCE/DEVICE Description: STG Adhere to Safety Precautions With MIN Assistance/Device. Outcome: Progressing   Problem: RH PAIN MANAGEMENT Goal: RH STG PAIN MANAGED AT OR BELOW PT'S PAIN GOAL Description: AT OR BELOW LEVEL 4 Outcome: Progressing   Problem: RH KNOWLEDGE DEFICIT Goal: RH STG INCREASE KNOWLEDGE OF HYPERTENSION Description: Pt and wife will state understanding of HTN using medications, dietary restrictions, exercise and using handouts and educational resources independently Outcome: Progressing Goal: RH STG INCREASE KNOWLEDGE OF DYSPHAGIA/FLUID INTAKE Description: Pt and wife will state understanding of dysphagia using handouts and educational resources independently Outcome: Progressing Goal: RH STG INCREASE KNOWLEGDE OF HYPERLIPIDEMIA Description: Pt and wife will state understanding of HLD using medications, dietary restrictions, exercise and using handouts and educational resources independently Outcome: Progressing Goal: RH STG INCREASE KNOWLEDGE OF STROKE PROPHYLAXIS Description: Pt and wife will state understanding of secondary stroke using medications, dietary restrictions, exercise and using handouts and educational resources independently Outcome: Progressing   Problem: RH BOWEL ELIMINATION Goal: RH STG MANAGE BOWEL WITH ASSISTANCE Description: STG Manage Bowel with MOD I  Assistance. Outcome: Not Progressing   Problem: RH BLADDER ELIMINATION Goal: RH STG MANAGE BLADDER WITH ASSISTANCE Description: STG Manage Bladder With MIN Assistance Outcome: Not Progressing

## 2019-07-16 NOTE — Progress Notes (Signed)
Speech Language Pathology Daily Session Note  Patient Details  Name: Kyle Garcia MRN: [id number] Date of Birth: 1951/06/10  Today's Date: 07/16/2019 SLP Individual Time: 8115-7262 SLP Individual Time Calculation (min): 55 min  Short Term Goals: Week 1: SLP Short Term Goal 1 (Week 1): Patient will consume current diet with minimal overt s/s of aspiration and Mod verbal cues for use of swallowing compensatory strategies. SLP Short Term Goal 2 (Week 1): Patient will verbally answer open-ended questions in 50% of opportunities with Max A multimodal cues. SLP Short Term Goal 3 (Week 1): Patient will demonstrate sustained attention to functional tasks for 5 minutes with Max A verbal cues for redirection. SLP Short Term Goal 4 (Week 1): Patient will initiate functional tasks with Max A multimodal cues.  Skilled Therapeutic Interventions: Skilled treatment session focused on dysphagia and cognitive goals. SLP facilitated session by providing Min A verbal cues for use of swallowing compensatory strategies with breakfast meal of Dys. 1 textures with honey-thick liquids. Patient with intermittent overt s/s of aspiration, suspect due to decreased attention to bolus. Patient utilized his RUE to scoop food onto his spoon and to hold his cup with Mod-Max A verbal cues to locate bowl at midline to locate food items. Patient demonstrated selective attention to self-feeding in a mildly distracting environment for ~2 minutes with Mod verbal cues. Patient with decreased language of confusion and asking appropriate questions in regards to upcoming medical appointments. Patient left upright in bed with alarm on and all needs within reach. Continue with current plan of care.      Pain No/Denies Pain   Therapy/Group: Individual Therapy  Lataja Newland 07/16/2019, 12:42 PM

## 2019-07-16 NOTE — Progress Notes (Signed)
Physical Therapy Session Note  Patient Details  Name: Filiberto Wamble MRN: [id number] Date of Birth: 1951-03-15  Today's Date: 07/16/2019 PT Individual Time: 1020-1105 PT Individual Time Calculation (min): 45 min   Short Term Goals: Week 1:  PT Short Term Goal 1 (Week 1): Pt will maintain sitting balance with CGA PT Short Term Goal 2 (Week 1): Pt will perform bed<>chair transfer with max assist +1 PT Short Term Goal 3 (Week 1): Pt will perform sit<>stand with max assist +2  Skilled Therapeutic Interventions/Progress Updates:   Pt received supine in bed and agreeable to PT. Rolling R and L with total A for PT to don pants. Supine>sit transfer with total A and max cues for awareness of LLE  And decreased pushers syndrome. Sitting balance EOB with mace multimodal cues for WB through RUE on elbow and to maintain hand supinated to prevent pushers syndrome. SB transfer to [facility] with total A +2. Sit<>stand in parallel bars x 2 and sustained standing for 1 min each bout. Total assist +2 to force WB through LLE to combat flexor tone. Pt noted to have incontinent bowel movement while in standing. Pt returned to room and performed squat pivot transfer to bed with total A and UE supported on PT. Sit>supine completed with Total A + 2. Rolling R and L with total A for PT to perform pericare. Pt then left supine in bed with call bell in reach and all needs met.        Therapy Documentation Precautions:  Precautions Precautions: Fall Precaution Comments: Dense Lt hemi, pusher Restrictions Weight Bearing Restrictions: No  Pain: Faces: hurts a Lot. R side.     Therapy/Group: Individual Therapy  Lorie Phenix 07/16/2019, 11:02 AM

## 2019-07-16 NOTE — Progress Notes (Signed)
[place] PHYSICAL MEDICINE & REHABILITATION PROGRESS NOTE   Subjective/Complaints:  Lying in bed. Reports no problems.   ROS: Limited due to cognitive/behavioral    Objective:   No results found. No results for input(s): WBC, HGB, HCT, PLT in the last 72 hours. No results for input(s): NA, K, CL, CO2, GLUCOSE, BUN, CREATININE, CALCIUM in the last 72 hours.  Intake/Output Summary (Last 24 hours) at 07/16/2019 0956 Last data filed at 07/15/2019 1756 Gross per 24 hour  Intake 720 ml  Output -  Net 720 ml     Physical Exam: Vital Signs Blood pressure 126/89, pulse (!) 108, temperature 98 F (36.7 C), temperature source Oral, resp. rate 16, weight 62.1 kg, SpO2 99 %.   Constitutional: No distress . Vital signs reviewed. HEENT: EOMI, oral membranes moist Neck: supple Cardiovascular: RRR without murmur. No JVD    Respiratory: CTA Bilaterally without wheezes or rales. Normal effort    GI: BS +, non-tender, non-distended  Extremities: No clubbing, cyanosis, or edema Skin: No evidence of breakdown, no evidence of rash Neurologic: remains distracted. Right gaze preference. Needs cueing for conversation, attention.  motor strength is 5/5 in Right and 0/5 left  deltoid, bicep, tricep, grip, hip flexor, knee extensors, ankle dorsiflexor and plantar flexor +triple flexor response in LLE still present.--painful with LLE PROM  Sensory exam senses pain LUE and LLE  Musculoskeletal: no swelling Psych: flat  Assessment/Plan: 1. Functional deficits secondary to Right MCA infarct with dysphagia  which require 3+ hours per day of interdisciplinary therapy in a comprehensive inpatient rehab setting.  Physiatrist is providing close team supervision and 24 hour management of active medical problems listed below.  Physiatrist and rehab team continue to assess barriers to discharge/monitor patient progress toward functional and medical goals  Care Tool:  Bathing    Body parts bathed by  patient: Chest, Abdomen, Face   Body parts bathed by helper: Right arm, Left arm, Front perineal area, Buttocks, Right upper leg, Left upper leg, Right lower leg, Left lower leg     Bathing assist Assist Level: Maximal Assistance - Patient 24 - 49%     Upper Body Dressing/Undressing Upper body dressing   What is the patient wearing?: Pull over shirt    Upper body assist Assist Level: Total Assistance - Patient < 25%    Lower Body Dressing/Undressing Lower body dressing      What is the patient wearing?: Pants, Incontinence brief     Lower body assist Assist for lower body dressing: Dependent - Patient 0%     Toileting Toileting    Toileting assist Assist for toileting: Dependent - Patient 0%     Transfers Chair/bed transfer  Transfers assist     Chair/bed transfer assist level: 2 Helpers     Locomotion Ambulation   Ambulation assist   Ambulation activity did not occur: Safety/medical concerns          Walk 10 feet activity   Assist  Walk 10 feet activity did not occur: Safety/medical concerns        Walk 50 feet activity   Assist Walk 50 feet with 2 turns activity did not occur: Safety/medical concerns         Walk 150 feet activity   Assist Walk 150 feet activity did not occur: Safety/medical concerns         Walk 10 feet on uneven surface  activity   Assist Walk 10 feet on uneven surfaces activity did not occur: Safety/medical concerns  Wheelchair     Assist Will patient use wheelchair at discharge?: Yes Type of Wheelchair: Manual    Wheelchair assist level: Dependent - Patient 0%(TIS w/c) Max wheelchair distance: 150 ft    Wheelchair 50 feet with 2 turns activity    Assist        Assist Level: Dependent - Patient 0%   Wheelchair 150 feet activity     Assist     Assist Level: Dependent - Patient 0%    Medical Problem List and Plan: 1.Dense lefthemiparesiswith dysphagiasecondary to  embolic shower right MCA occlusion status post reperfusion. Infarct felt to be embolic secondary to hypercoagulable state from lung cancer -CIR PT, OT,  SLP   -PRAFO, WHO Left side  -significant visual-spatial deficits to left 2. Antithrombotics: -DVT/anticoagulation/history of right lower extremity OVA:NVBTYOM -antiplatelet therapy: N/A 3. Pain Management:Tylenol as needed  -antispasmodics   -continue low dose baclofen 4. Mood:Provide emotional support -antipsychotic agents: N/A  -no HR elevation beyond baseline with 2.5 ritalin, increase to 5mg  5. Neuropsych: This patientiscapable of making decisions on hisown behalf. 6. Skin/Wound Care:Routine skin checks 7. Fluids/Electrolytes/Nutrition:   -continue IVF  -encourage PO, check labs friday 8.Stage IV adenocarcinoma right lung. Chemotherapy as per [name] 9. Aspiration pneumonia. Continue Unasyn initiated 07/08/2019 x 5-day course. Afebrile, WBC 8.3  -aspiration precautions 10. Dysphagia. Dysphasia #2 honey thick liquids.   -advance diet per SLP  -IVF HS  -check labs tomorrow 11. Hyperlipidemia. Crestor 12. Remote tobacco abuse. Counseling 13.Acute on chronic anemia. Hgb slightly up at 8.3 will cont to monitor   14 Hypocalcemia- sl improved, still low  -ongoing observation  -supplementation  -likely related to #8  -recheck friday  LOS: 6 days A FACE TO FACE EVALUATION WAS PERFORMED  [name] 07/16/2019, 9:56 AM

## 2019-07-16 NOTE — Progress Notes (Signed)
Occupational Therapy Session Note  Patient Details  Name: Kyle Garcia MRN: [id number] Date of Birth: 1951-07-09  Today's Date: 07/16/2019 OT Individual Time: 2703-5009 OT Individual Time Calculation (min): 40 min    Short Term Goals: Week 1:  OT Short Term Goal 1 (Week 1): Pt will complete 1 grooming task EOB with 1 assist for sitting balance OT Short Term Goal 2 (Week 1): Pt will initiate washing affected arm with max vcs OT Short Term Goal 3 (Week 1): Pt will exhibit improved sustained attention by initiating placing 1 limb into UB garment  Skilled Therapeutic Interventions/Progress Updates:    1:1 Pt in bed when arrived. Focused on bed mobility with rolling to the right. Focus on visual attention to midline in sidelying to locate and identify cards. Returned to supine with HOB elevated able to get his left LE with more knee extension with slight hip flexion with knee raised. Heat applied around thigh at pt's request with towel inbetween heat and his skin.   Transitioned to self feeding with right hand. Pt able to self feed 50% of the time one magic up.  Other 50% therapist needed to feed him. Difficulty with tracking to located the container each time. Pt demonstrated sustained attention with mod A.   Set up the pt with phone call with his brother at end of session.   Therapy Documentation Precautions:  Precautions Precautions: Fall Precaution Comments: Dense Lt hemi, pusher Restrictions Weight Bearing Restrictions: No Pain: Pain Assessment Pain Scale: 0-10 Pain Score: 0-No pain   Therapy/Group: Individual Therapy  Willeen Cass [facility] 07/16/2019, 3:23 PM

## 2019-07-17 ENCOUNTER — Other Ambulatory Visit: Payer: Medicare Other

## 2019-07-17 ENCOUNTER — Ambulatory Visit: Payer: Medicare Other | Admitting: Internal Medicine

## 2019-07-17 ENCOUNTER — Inpatient Hospital Stay (HOSPITAL_COMMUNITY): Payer: Medicare Other

## 2019-07-17 ENCOUNTER — Inpatient Hospital Stay (HOSPITAL_COMMUNITY): Payer: Medicare Other | Admitting: Physical Therapy

## 2019-07-17 ENCOUNTER — Inpatient Hospital Stay (HOSPITAL_COMMUNITY): Payer: Medicare Other | Admitting: Speech Pathology

## 2019-07-17 LAB — BASIC METABOLIC PANEL
Anion gap: 10 (ref 5–15)
BUN: 18 mg/dL (ref 8–23)
CO2: 23 mmol/L (ref 22–32)
Calcium: 8.5 mg/dL — ABNORMAL LOW (ref 8.9–10.3)
Chloride: 100 mmol/L (ref 98–111)
Creatinine, Ser: 0.66 mg/dL (ref 0.61–1.24)
GFR calc Af Amer: >60 mL/min (ref >60)
GFR calc non Af Amer: >60 mL/min (ref >60)
Glucose, Bld: 115 mg/dL — ABNORMAL HIGH (ref 70–99)
Potassium: 3.9 mmol/L (ref 3.5–5.1)
Sodium: 133 mmol/L — ABNORMAL LOW (ref 135–145)

## 2019-07-17 NOTE — Progress Notes (Signed)
Physical Therapy Weekly Progress Note  Patient Details  Name: Kyle Garcia MRN: [id number] Date of Birth: 03-04-1951  Beginning of progress report period: July 11, 2019 End of progress report period: July 17, 2019  Today's Date: 07/17/2019 PT Individual Time: 7341-9379 PT Individual Time Calculation (min): 45 min   Patient has met 0 of 3 short term goals.  Pt is making slow progress overall and currently requires +2 assist for all mobility (bed mobility, transfers) and max assist for sitting balance. Pt is significantly limited by impaired cognition, visual deficits, impaired cognition & awareness, impaired midline orientation, R neglect, balance, and pain and ROM limitations. Pt would benefit from continued skilled PT treatment to focus on activity tolerance, bed mobility, transfers, visual-spatial deficits, midline orientation, & balance to increase independence with mobility & reduce burden of care.   Patient continues to demonstrate the following deficits muscle weakness and muscle joint tightness, decreased cardiorespiratoy endurance, motor apraxia, decreased coordination and decreased motor planning, decreased visual acuity, decreased visual perceptual skills and decreased visual motor skills, decreased midline orientation, decreased attention to right, right side neglect and decreased motor planning, decreased initiation, decreased attention, decreased awareness, decreased problem solving, decreased safety awareness, decreased memory and delayed processing, central origin and decreased sitting balance, decreased standing balance, decreased postural control and decreased balance strategies and therefore will continue to benefit from skilled PT intervention to increase functional independence with mobility.  Patient progressing toward long term goals..  Continue plan of care.  PT Short Term Goals Week 1:  PT Short Term Goal 1 (Week 1): Pt will maintain sitting balance with CGA PT Short Term  Goal 1 - Progress (Week 1): Not met PT Short Term Goal 2 (Week 1): Pt will perform bed<>chair transfer with max assist +1 PT Short Term Goal 2 - Progress (Week 1): Not met PT Short Term Goal 3 (Week 1): Pt will perform sit<>stand with max assist +2 PT Short Term Goal 3 - Progress (Week 1): Met Week 2:  PT Short Term Goal 1 (Week 2): Pt will perform bed mobility with max assist +1. PT Short Term Goal 2 (Week 2): Pt will complete bed<>w/c with max assist +1. PT Short Term Goal 3 (Week 2): Pt will maintain static sitting balance with mod assist +1.  Skilled Therapeutic Interventions/Progress Updates:  Pt received in bed with nursing beginning to assist pt onto bed pan. Therapist returned shortly & pt agreeable to tx. Provided total assist for donning pants at bed level & positioning BLE with pt able to initiate bridging but unable to clear buttocks on this date, so pt rolls L<>R with +2 assist to allow therapist to completely pull pants over hips. Pt transfers to R sidelying>sitting EOB with +2 assist with multimodal cuing for pt to push through RUE to upright himself with pt able to assist. Pt transfers bed>w/c and w/c<>mat table with +2 assist slide board with therapist instructing pt to push with RUE to assist with scooting but pt unable to coordinate. Assisted pt onto mat table and pt requires max assist for static sitting balance with cuing to bring shoulders to R towards therapist for correct midline orientation as pt pushes L with RUE in weight bearing, otherwise without RUE support pt has significant LOB to L & pt unaware. Pt continues to maintain L lateral flexion and R rotation of cervical spine. Attempted to have pt engage in reaching to R to obtain bean bags then track to midline to place it with activity addressing pt  focusing on object with eyes and tracking with pt requiring max cuing overall but some improvement compared to last time activity was attempted. Pt transitioned to R sidelying on  mat table (B nystagmus noted again on this date) to allow for passisve cervical stretch. Pt appears to have spasms as B hips will flex & pt reports "ow" with pt c/o spasms. Pt assisted back to w/c and left in w/c with chair alarm donned, lap tray donned, call bell in RUE, and resting hand splint applied to L hand.   Therapy Documentation Precautions:  Precautions Precautions: Fall Precaution Comments: Dense Lt hemi, pusher Restrictions Weight Bearing Restrictions: No   General: PT Amount of Missed Time (min): 15 Minutes PT Missed Treatment Reason: Toileting  Pain: Pt with c/o pain in back & lower cervical/upper thoracic spine with rest breaks provided and soft tissue massage performed to lower cervical area.   Therapy/Group: Individual Therapy  [name] 07/17/2019, 12:17 PM

## 2019-07-17 NOTE — Progress Notes (Signed)
Occupational Therapy Session Note  Patient Details  Name: Kyle Garcia MRN: [id number] Date of Birth: [birth year]  Today's Date: 07/17/2019 OT Individual Time: 1300-1415 OT Individual Time Calculation (min): 75 min    Short Term Goals: Week 1:  OT Short Term Goal 1 (Week 1): Pt will complete 1 grooming task EOB with 1 assist for sitting balance OT Short Term Goal 2 (Week 1): Pt will initiate washing affected arm with max vcs OT Short Term Goal 3 (Week 1): Pt will exhibit improved sustained attention by initiating placing 1 limb into UB garment  Skilled Therapeutic Interventions/Progress Updates:    1:1. Pt received in TIS with lunch tray provided by nutrition services. With nothing but spoon, plate, and oce cream up. Pt unable to locate plate at midline however able to ask, "what does [facility] stand for?" reading the label in the drink holder to far R of lap tray. When drink or ice cream cup placed in cup holder, pt unable to locate. OT shortened spoon and applied red foam handle for visual stimuli, however pt only able to locate and grasp spoon 1x without HOH A. Overall total HOH A provided to self feed and keep pt from sticking spoon down his throat. Pt reports need to toilet and NT arrives in room. Pt stand pivot transfers with total A with arms around OT and +2 completing CM. Pt with small smear of BM on brief, however unable to void either bowel or bladder. Exited session after pt seated in TIS, belt alarm on, call light in reach and all needs met  Session 2: Pt received in TIS. Pt repeatedly voicing needing to void bowel, then bladder then back to bowel. Pt unable to void bladder into urinal. +2 A required to stand pivot to shower chair with arm wrapped around OT. Pt requires to hold OT hand far out to R to maintain midline seated in shower chair with BSC cut out. Pt unable to void. Stand pivot as stated above back to TIS and then back to bed. Exited session with pt seated in bed exit alarm on  and call light tin reach  Therapy Documentation Precautions:  Precautions Precautions: Fall Precaution Comments: Dense Lt hemi, pusher Restrictions Weight Bearing Restrictions: No General: General PT Missed Treatment Reason: Toileting Vital Signs:  Pain:   ADL: ADL Eating: Not assessed Grooming: Dependent(washing hands) Where Assessed-Grooming: Bed level Upper Body Bathing: Dependent Where Assessed-Upper Body Bathing: Bed level Lower Body Bathing: Dependent Where Assessed-Lower Body Bathing: Bed level Upper Body Dressing: Dependent Where Assessed-Upper Body Dressing: Bed level Lower Body Dressing: Dependent Where Assessed-Lower Body Dressing: Bed level Toileting: Dependent Where Assessed-Toileting: Bed level Toilet Transfer: Not assessed Tub/Shower Transfer: Not assessed Vision   Perception    Praxis   Exercises:   Other Treatments:     Therapy/Group: Individual Therapy  [name] 07/17/2019, 2:12 PM

## 2019-07-17 NOTE — Progress Notes (Signed)
[facility] PHYSICAL MEDICINE & REHABILITATION PROGRESS NOTE   Subjective/Complaints:  No new issues. Lying in bed. Says he needs to use bathroom. Denies pain today  ROS: Limited due to cognitive/behavioral   Objective:   No results found. No results for input(s): WBC, HGB, HCT, PLT in the last 72 hours. Recent Labs    07/17/19 0632  NA 133*  K 3.9  CL 100  CO2 23  GLUCOSE 115*  BUN 18  CREATININE 0.66  CALCIUM 8.5*    Intake/Output Summary (Last 24 hours) at 07/17/2019 1047 Last data filed at 07/17/2019 0820 Gross per 24 hour  Intake 140 ml  Output -  Net 140 ml     Physical Exam: Vital Signs Blood pressure 106/68, pulse (!) 103, temperature 97.9 F (36.6 C), temperature source Oral, resp. rate 16, weight 62.1 kg, SpO2 95 %.   Constitutional: No distress . Vital signs reviewed. HEENT: EOMI, oral membranes moist Neck: supple Cardiovascular: RRR without murmur. No JVD    Respiratory: CTA Bilaterally without wheezes or rales. Normal effort    GI: BS +, non-tender, non-distended  Extremities: No clubbing, cyanosis, or edema Skin: No evidence of breakdown, no evidence of rash Neurologic: a little more attentive. continued Right gaze preference.    motor strength is 5/5 in Right and 0/5 left  deltoid, bicep, tricep, grip, hip flexor, knee extensors, ankle dorsiflexor and plantar flexor +triple flexor response in LLE still present.keeps left leg in figure 4 position despite cueing  Sensory exam: decreased pain sense LUE and LLE  Musculoskeletal: no swelling Psych: remains flat  Assessment/Plan: 1. Functional deficits secondary to Right MCA infarct with dysphagia  which require 3+ hours per day of interdisciplinary therapy in a comprehensive inpatient rehab setting.  Physiatrist is providing close team supervision and 24 hour management of active medical problems listed below.  Physiatrist and rehab team continue to assess barriers to discharge/monitor patient  progress toward functional and medical goals  Care Tool:  Bathing    Body parts bathed by patient: Chest, Abdomen, Face   Body parts bathed by helper: Right arm, Left arm, Front perineal area, Buttocks, Right upper leg, Left upper leg, Right lower leg, Left lower leg     Bathing assist Assist Level: Maximal Assistance - Patient 24 - 49%     Upper Body Dressing/Undressing Upper body dressing   What is the patient wearing?: Pull over shirt    Upper body assist Assist Level: Total Assistance - Patient < 25%    Lower Body Dressing/Undressing Lower body dressing      What is the patient wearing?: Pants, Incontinence brief     Lower body assist Assist for lower body dressing: Dependent - Patient 0%     Toileting Toileting    Toileting assist Assist for toileting: Dependent - Patient 0%     Transfers Chair/bed transfer  Transfers assist     Chair/bed transfer assist level: 2 Helpers     Locomotion Ambulation   Ambulation assist   Ambulation activity did not occur: Safety/medical concerns          Walk 10 feet activity   Assist  Walk 10 feet activity did not occur: Safety/medical concerns        Walk 50 feet activity   Assist Walk 50 feet with 2 turns activity did not occur: Safety/medical concerns         Walk 150 feet activity   Assist Walk 150 feet activity did not occur: Safety/medical concerns  Walk 10 feet on uneven surface  activity   Assist Walk 10 feet on uneven surfaces activity did not occur: Safety/medical concerns         Wheelchair     Assist Will patient use wheelchair at discharge?: Yes Type of Wheelchair: Manual    Wheelchair assist level: Dependent - Patient 0%(TIS w/c) Max wheelchair distance: 150 ft    Wheelchair 50 feet with 2 turns activity    Assist        Assist Level: Dependent - Patient 0%   Wheelchair 150 feet activity     Assist     Assist Level: Dependent - Patient  0%    Medical Problem List and Plan: 1.Dense lefthemiparesiswith dysphagiasecondary to embolic shower right MCA occlusion status post reperfusion. Infarct felt to be embolic secondary to hypercoagulable state from lung cancer -CIR PT, OT,  SLP   -PRAFO, WHO Left side  -significant visual-spatial deficits on left 2. Antithrombotics: -DVT/anticoagulation/history of right lower extremity GGE:ZMOQHUT -antiplatelet therapy: N/A 3. Pain Management:Tylenol as needed  -antispasmodics   -continue low dose baclofen--increase to TID   -orthotics 4. Mood:Provide emotional support -antipsychotic agents: N/A  -no HR elevation beyond baseline, continue ritalin 5mg  5. Neuropsych: This patientiscapable of making decisions on hisown behalf. 6. Skin/Wound Care:Routine skin checks 7. Fluids/Electrolytes/Nutrition:   -continue IVF  -BUN/Cr holding--no change in plan 8.Stage IV adenocarcinoma right lung. Chemotherapy as per [name] 9. Aspiration pneumonia. Continue Unasyn initiated 07/08/2019 x 5-day course. Afebrile, WBC 8.3  -aspiration precautions 10. Dysphagia. Dysphasia #2 honey thick liquids.   -advance diet per SLP  -IVF HS  -labs reviewed. No changes 11. Hyperlipidemia. Crestor 12. Remote tobacco abuse. Counseling 13.Acute on chronic anemia. Hgb slightly up at 8.3 will cont to monitor   14 Hypocalcemia- improved   -supplementation  -likely related to #8     LOS: 7 days A FACE TO [place] 07/17/2019, 10:47 AM

## 2019-07-17 NOTE — Progress Notes (Signed)
Speech Language Pathology Weekly Progress and Session Note  Patient Details  Name: Kyle Garcia MRN: [id number] Date of Birth: 01/20/1951  Beginning of progress report period: July 10, 2019 End of progress report period: July 17, 2019  Today's Date: 07/17/2019 SLP Individual Time: 2233-6122 SLP Individual Time Calculation (min): 55 min  Short Term Goals: Week 1: SLP Short Term Goal 1 (Week 1): Patient will consume current diet with minimal overt s/s of aspiration and Mod verbal cues for use of swallowing compensatory strategies. SLP Short Term Goal 1 - Progress (Week 1): Met SLP Short Term Goal 2 (Week 1): Patient will verbally answer open-ended questions in 50% of opportunities with Max A multimodal cues. SLP Short Term Goal 2 - Progress (Week 1): Met SLP Short Term Goal 3 (Week 1): Patient will demonstrate sustained attention to functional tasks for 5 minutes with Max A verbal cues for redirection. SLP Short Term Goal 3 - Progress (Week 1): Not met SLP Short Term Goal 4 (Week 1): Patient will initiate functional tasks with Max A multimodal cues. SLP Short Term Goal 4 - Progress (Week 1): Met    New Short Term Goals: Week 2: SLP Short Term Goal 1 (Week 2): Patient will consume current diet with minimal overt s/s of aspiration and Min verbal cues for use of swallowing compensatory strategies. SLP Short Term Goal 2 (Week 2): Patient will consume trials of nectar-thick liquids with minimal overt s/s of aspiration and Min A verbal cues for use of swallowing compensatory strategies. SLP Short Term Goal 3 (Week 2): Patient will demonstrate sustained attention to functional tasks for 5 minutes with Max A verbal cues for redirection. SLP Short Term Goal 4 (Week 2): Patient will initiate functional tasks with Mod A multimodal cues. SLP Short Term Goal 5 (Week 2): Patient will scan to midline to locate functional items in 5/10 opportunities.  Weekly Progress Updates: Patient has made functional  gains and has met 3 of 4 STGs this reporting period. Currently, patient is consuming Dys. 2 textures with honey-thick liquids with minimal overt s/s of aspiration and Mod A verbal cues for use of swallowing compensatory strategies. Patient demonstrates increased arousal and will answer open-ended questions in 90% of opportunities and is ~90% intelligible, however, language of confusion can impact conversation at times. Patient also demonstrates increased initiation and sustained attention to basic and familiar tasks for short bursts of time but needs Max verbal cues for redirection due to verbosity. Max-Total A verbal cues are also required for attention/scanning to midline and problem solving. Patient and family education is ongoing. Patient would benefit from continued skilled SLP intervention to maximize his cognitive and swallowing function prior to discharge.      Intensity: Minumum of 1-2 x/day, 30 to 90 minutes Frequency: 3 to 5 out of 7 days Duration/Length of Stay: 3 weeks Treatment/Interventions: Cognitive remediation/compensation;Dysphagia/aspiration precaution training;Internal/external aids;Speech/Language facilitation;Therapeutic Activities;Environmental controls;Cueing hierarchy;Functional tasks;Patient/family education   Daily Session  Skilled Therapeutic Interventions: Skilled treatment session focused on cognitive and dysphagia goals. Upon arrival, patient requested to use the bathroom. Patient required total +2 for bed mobility due to pain. While on the bedpan, patient required Max A multimodal cues to attend to task and was unable to void or have a bowel movement. Patient performed oral care via the suction toothbrush with Max A hand over hand assist for thoroughness. SLP administered tsp sips of nectar-thick liquids without overt s/s of aspiration. Recommend continued trials prior to upgrade. Patient also requested to call his wife.  Total A needed to dial with Max verbal cues needed  for attention to task. SLP provided education to the patient's wife via telephone in regards to current cognitive functioning and goals of skilled SLP intervention. She verbalized understanding.   Patient left upright in bed with alarm on and all needs within reach. Continue with current plan of care.      Pain Pain with moving, patient repositioned   Therapy/Group: Individual Therapy  Oris Calmes 07/17/2019, 6:23 AM

## 2019-07-18 ENCOUNTER — Inpatient Hospital Stay (HOSPITAL_COMMUNITY): Payer: Medicare Other

## 2019-07-18 NOTE — Progress Notes (Signed)
Occupational Therapy Session Note  Patient Details  Name: Kyle Garcia MRN: [id number] Date of Birth: January 23, 1951  Today's Date: 07/18/2019 OT Individual Time: 2035-5974 OT Individual Time Calculation (min): 28 min    Short Term Goals: Week 1:  OT Short Term Goal 1 (Week 1): Pt will complete 1 grooming task EOB with 1 assist for sitting balance OT Short Term Goal 2 (Week 1): Pt will initiate washing affected arm with max vcs OT Short Term Goal 3 (Week 1): Pt will exhibit improved sustained attention by initiating placing 1 limb into UB garment  Skilled Therapeutic Interventions/Progress Updates:    1;1. Pt received in bed agreeable to OT and reports pain in neck. Repositioning provided with head extended to neutral and pt reports relief. Supine>sitting total A. Pt scans midline and R of tabletop to locate photos and name people/animas in photos for visual scanning to midline and overall Mod-max A required for sitting balance. Pt improved sitting balnace with OT seated on R and elbow/forearm placed on OT thigh. Unable to achieve true midline without A from OT d/t decreased attention. Exited session with pt seated inbed, exit alarm on and call light in reach  Therapy Documentation Precautions:  Precautions Precautions: Fall Precaution Comments: Dense Lt hemi, pusher Restrictions Weight Bearing Restrictions: No General:   Vital Signs:  Pain:   ADL: ADL Eating: Not assessed Grooming: Dependent(washing hands) Where Assessed-Grooming: Bed level Upper Body Bathing: Dependent Where Assessed-Upper Body Bathing: Bed level Lower Body Bathing: Dependent Where Assessed-Lower Body Bathing: Bed level Upper Body Dressing: Dependent Where Assessed-Upper Body Dressing: Bed level Lower Body Dressing: Dependent Where Assessed-Lower Body Dressing: Bed level Toileting: Dependent Where Assessed-Toileting: Bed level Toilet Transfer: Not assessed Tub/Shower Transfer: Not assessed Vision    Perception    Praxis   Exercises:   Other Treatments:     Therapy/Group: Individual Therapy  [name] 07/18/2019, 1:28 PM

## 2019-07-18 NOTE — Progress Notes (Signed)
Kyle Garcia is a 68 y.o. male admitted for CIR with functional deficits following embolic right MCA infarction with dense left hemiparesis.  Past Medical History:  Diagnosis Date  . Cancer ([place])    stage 4 adenocarcinoma right lung  . GERD (gastroesophageal reflux disease)   . Hyperlipidemia   . Stroke ([place])    25-30 emboli seen on screening MRI     Subjective: No new complaints. No new problems. Slept well. Feeling OK.  Objective: Vital signs in last 24 hours: Temp:  [97.8 F (36.6 C)-98.1 F (36.7 C)] 98.1 F (36.7 C) (07/25 0536) Pulse Rate:  [111-126] 111 (07/25 0536) Resp:  [16-20] 16 (07/25 0536) BP: (116-119)/(65-80) 117/65 (07/25 0536) SpO2:  [98 %-99 %] 99 % (07/25 0536) Weight change:  Last BM Date: 07/18/19  Intake/Output from previous day: 07/24 0701 - 07/25 0700 In: 720 [P.O.:720] Out: -    Patient Vitals for the past 24 hrs:  BP Temp Temp src Pulse Resp SpO2  07/18/19 0536 117/65 98.1 F (36.7 C) - (!) 111 16 99 %  07/17/19 1955 119/68 97.8 F (36.6 C) Oral (!) 119 20 98 %  07/17/19 1432 116/80 97.9 F (36.6 C) - (!) 126 18 99 %     Physical Exam General: No apparent distress;  thin HEENT: not dry Lungs: Normal effort. Lungs clear to auscultation, no crackles or wheezes. Cardiovascular: Regular rate and rhythm, no edema Abdomen: S/NT/ND; BS(+) Musculoskeletal:  Unchanged; right ankle brace Neurological: No new neurological deficits with dense left hemiparesis Wounds: N/A    Skin: clear   Mental state: Alert, oriented, cooperative    Lab Results: BMET    Component Value Date/Time   NA 133 (L) 07/17/2019 0632   K 3.9 07/17/2019 0632   CL 100 07/17/2019 0632   CO2 23 07/17/2019 0632   GLUCOSE 115 (H) 07/17/2019 0632   BUN 18 07/17/2019 0632   CREATININE 0.66 07/17/2019 0632   CREATININE 0.86 07/01/2019 1059   CREATININE 0.85 12/02/2018 0906   CALCIUM 8.5 (L) 07/17/2019 0632   GFRNONAA >60 07/17/2019 0632   GFRNONAA >60  07/01/2019 1059   GFRNONAA 91 12/02/2018 0906   GFRAA >60 07/17/2019 0632   GFRAA >60 07/01/2019 1059   GFRAA 105 12/02/2018 0906   CBC    Component Value Date/Time   WBC 8.3 07/13/2019 0733   RBC 3.39 (L) 07/13/2019 0733   HGB 9.2 (L) 07/13/2019 0733   HGB 10.3 (L) 07/01/2019 1059   HCT 29.1 (L) 07/13/2019 0733   PLT 206 07/13/2019 0733   PLT 215 07/01/2019 1059   MCV 85.8 07/13/2019 0733   MCH 27.1 07/13/2019 0733   MCHC 31.6 07/13/2019 0733   RDW 16.2 (H) 07/13/2019 0733   LYMPHSABS 0.5 (L) 07/13/2019 0733   MONOABS 0.8 07/13/2019 0733   EOSABS 0.1 07/13/2019 0733   BASOSABS 0.0 07/13/2019 0733    Medications: I have reviewed the patient's current medications.  Assessment/Plan:  Functional deficits secondary to right MCA embolic infarction.  Continue CIR DVT/anticoagulation.  Continue Eliquis History of stage IV lung adenocarcinoma.  Follow-up oncology History of aspiration pneumonia.  Antibiotic therapy completed July 08, 2019 Dyslipidemia continue Crestor   Length of stay, days: 8  Marletta Lor , MD 07/18/2019, 10:48 AM

## 2019-07-19 ENCOUNTER — Inpatient Hospital Stay (HOSPITAL_COMMUNITY): Payer: Medicare Other | Admitting: Physical Therapy

## 2019-07-19 ENCOUNTER — Inpatient Hospital Stay (HOSPITAL_COMMUNITY): Payer: Medicare Other

## 2019-07-19 NOTE — Progress Notes (Signed)
Occupational Therapy Weekly Progress Note  Patient Details  Name: Kyle Garcia MRN: [id number] Date of Birth: 06/07/1951  Beginning of progress report period: July 11, 2019 End of progress report period: July 19, 2019  Today's Date: 07/19/2019 OT Individual Time: 1115-1200 OT Individual Time Calculation (min): 45 min    Patient has met 0 of 3 short term goals.  Pt is making very slow progress d/t impact of severe cognitive deficits, pain, visual-perceptual deficits and L inattention. Pt is currently total A-+2 A for all mobility and ADLs. Pt continues to demo hypertonicity, abnormal ROM and abnormal postural alignment impacting participation in ADLs. Pt requires continued therapy to decrease caregiver burden  Patient continues to demonstrate the following deficits: muscle weakness, decreased cardiorespiratoy endurance, impaired timing and sequencing, abnormal tone, unbalanced muscle activation, motor apraxia, ataxia, decreased coordination and decreased motor planning, decreased visual acuity, decreased visual perceptual skills and decreased visual motor skills, decreased midline orientation, left side neglect, decreased motor planning and ideational apraxia, decreased initiation, decreased attention, decreased awareness, decreased problem solving, decreased safety awareness, decreased memory and delayed processing and decreased sitting balance, decreased standing balance, decreased postural control, hemiplegia and decreased balance strategies and therefore will continue to benefit from skilled OT intervention to enhance overall performance with BADL and Reduce care partner burden.  Patient progressing toward long term goals..  Continue plan of care.  OT Short Term Goals Week 1:  OT Short Term Goal 1 (Week 1): Pt will complete 1 grooming task EOB with 1 assist for sitting balance OT Short Term Goal 1 - Progress (Week 1): Progressing toward goal OT Short Term Goal 2 (Week 1): Pt will initiate  washing affected arm with max vcs OT Short Term Goal 2 - Progress (Week 1): Progressing toward goal OT Short Term Goal 3 (Week 1): Pt will exhibit improved sustained attention by initiating placing 1 limb into UB garment OT Short Term Goal 3 - Progress (Week 1): Not met Week 2:  OT Short Term Goal 1 (Week 2): Pt will locate 1 item at midline with MAX VC OT Short Term Goal 2 (Week 2): Pt will maintain static sitting balance for 10 seconds with MOD A and max cuing OT Short Term Goal 3 (Week 2): Pt will sustain attention to wash 1 body part wiht min VC  Skilled Therapeutic Interventions/Progress Updates:    1:1 Pt agreeable to bathing and dressing this date at bed level. Pt with incontinent episode of urine in pants. Pt able to grasp washcloth on R and initate washing face. Pt washes chest and L armpit with no VC only OT lifting up L arm to access armpit! Pt washes peri area with HOH A to initiate washing. Pt able to wash R lower leg and L lower leg when placed into figure 4. Pt requires total HOH A to don shirt using hemi technique and dependent to don pants/socks. Pt rolls to change brief, lower back up shirt and advance pants past hips. Exited session with pt seated in w/c, call light in reach and all needs met  Therapy Documentation Precautions:  Precautions Precautions: Fall Precaution Comments: Dense Lt hemi, pusher Restrictions Weight Bearing Restrictions: No General:   Vital Signs: Therapy Vitals Temp: 99.5 F (37.5 C) Temp Source: Oral Pulse Rate: 100 Resp: 16 BP: 116/67 Patient Position (if appropriate): Lying Oxygen Therapy SpO2: 96 % O2 Device: Room Air Pain:   ADL: ADL Eating: Not assessed Grooming: Dependent(washing hands) Where Assessed-Grooming: Bed level Upper Body Bathing: Dependent Where  Assessed-Upper Body Bathing: Bed level Lower Body Bathing: Dependent Where Assessed-Lower Body Bathing: Bed level Upper Body Dressing: Dependent Where Assessed-Upper Body  Dressing: Bed level Lower Body Dressing: Dependent Where Assessed-Lower Body Dressing: Bed level Toileting: Dependent Where Assessed-Toileting: Bed level Toilet Transfer: Not assessed Tub/Shower Transfer: Not assessed Vision   Perception    Praxis   Exercises:   Other Treatments:     Therapy/Group: Individual Therapy  [name] 07/19/2019, 6:53 AM

## 2019-07-19 NOTE — Progress Notes (Signed)
Physical Therapy Session Note  Patient Details  Name: Kyle Garcia MRN: [id number] Date of Birth: 17-Feb-1951  Today's Date: 07/19/2019 PT Individual Time: 1345-1427 PT Individual Time Calculation (min): 42 min   Short Term Goals: Week 1:  PT Short Term Goal 1 (Week 1): Pt will maintain sitting balance with CGA PT Short Term Goal 1 - Progress (Week 1): Not met PT Short Term Goal 2 (Week 1): Pt will perform bed<>chair transfer with max assist +1 PT Short Term Goal 2 - Progress (Week 1): Not met PT Short Term Goal 3 (Week 1): Pt will perform sit<>stand with max assist +2 PT Short Term Goal 3 - Progress (Week 1): Met  Skilled Therapeutic Interventions/Progress Updates:  Pt was seen bedside in the pm. Pt on bedpan, pt finished going to bathroom. Pt dependent for hygiene. Pt rolled R/L with total A to assist with removing bed pan and pulling up pants. Pt transferred supine to edge of bed with total A. Pt tolerated edge of bed about 15 minutes with c/g to mod A and verbal cues. While on edge of bed worked on weight shifting, midline posture and sitting balance. PROM L LE with static stretching. Pt left sitting up in tilt in space w/c with call bell within reach.   Therapy Documentation Precautions:  Precautions Precautions: Fall Precaution Comments: Dense Lt hemi, pusher Restrictions Weight Bearing Restrictions: No General:   PAIN:  No c/o pain.    Therapy/Group: Individual Therapy  Dub Amis 07/19/2019, 2:23 PM

## 2019-07-19 NOTE — Progress Notes (Signed)
Kyle Garcia is a 68 y.o. male who is admitted for CIR with functional deficits following embolic right MCA infarction with a dense left hemiparesis  Past Medical History:  Diagnosis Date  . Cancer ([place])    stage 4 adenocarcinoma right lung  . GERD (gastroesophageal reflux disease)   . Hyperlipidemia   . Stroke ([place])    25-30 emboli seen on screening MRI     Subjective: No new complaints. No new problems.   Objective: Vital signs in last 24 hours: Temp:  [98.1 F (36.7 C)-99.5 F (37.5 C)] 99.5 F (37.5 C) (07/26 0401) Pulse Rate:  [100-116] 100 (07/26 0401) Resp:  [16-19] 16 (07/26 0401) BP: (109-116)/(64-72) 116/67 (07/26 0401) SpO2:  [96 %-98 %] 96 % (07/26 0401) Weight change:  Last BM Date: 07/18/19  Intake/Output from previous day: 07/25 0701 - 07/26 0700 In: 2291.4 [P.O.:520; I.V.:1771.4] Out: -    Patient Vitals for the past 24 hrs:  BP Temp Temp src Pulse Resp SpO2  07/19/19 0401 116/67 99.5 F (37.5 C) Oral 100 16 96 %  07/18/19 2000 109/72 99.3 F (37.4 C) Oral (!) 114 18 98 %  07/18/19 1332 115/64 98.1 F (36.7 C) - (!) 116 19 98 %     Physical Exam General: No apparent distress   HEENT: not dry Lungs: Normal effort. Lungs clear to auscultation, no crackles or wheezes. Cardiovascular: Regular rate and rhythm, no edema Abdomen: S/NT/ND; BS(+) Musculoskeletal:  Unchanged with right ankle brace Neurological: No new neurological deficits with dense left hemiparesis Wounds: N/A    Skin: clear  Mental state: Alert, oriented, cooperative    Lab Results: BMET    Component Value Date/Time   NA 133 (L) 07/17/2019 0632   K 3.9 07/17/2019 0632   CL 100 07/17/2019 0632   CO2 23 07/17/2019 0632   GLUCOSE 115 (H) 07/17/2019 0632   BUN 18 07/17/2019 0632   CREATININE 0.66 07/17/2019 0632   CREATININE 0.86 07/01/2019 1059   CREATININE 0.85 12/02/2018 0906   CALCIUM 8.5 (L) 07/17/2019 0632   GFRNONAA >60 07/17/2019 0632   GFRNONAA >60 07/01/2019  1059   GFRNONAA 91 12/02/2018 0906   GFRAA >60 07/17/2019 0632   GFRAA >60 07/01/2019 1059   GFRAA 105 12/02/2018 0906   CBC    Component Value Date/Time   WBC 8.3 07/13/2019 0733   RBC 3.39 (L) 07/13/2019 0733   HGB 9.2 (L) 07/13/2019 0733   HGB 10.3 (L) 07/01/2019 1059   HCT 29.1 (L) 07/13/2019 0733   PLT 206 07/13/2019 0733   PLT 215 07/01/2019 1059   MCV 85.8 07/13/2019 0733   MCH 27.1 07/13/2019 0733   MCHC 31.6 07/13/2019 0733   RDW 16.2 (H) 07/13/2019 0733   LYMPHSABS 0.5 (L) 07/13/2019 0733   MONOABS 0.8 07/13/2019 0733   EOSABS 0.1 07/13/2019 0733   BASOSABS 0.0 07/13/2019 0733     Medications: I have reviewed the patient's current medications.  Assessment/Plan:  Functional deficits following right MCA embolic infarction with left hemiparesis.  Continue CIR DVT prophylaxis continue Eliquis History of stage IV lung adenocarcinoma.  Follow-up oncology Dyslipidemia continue Crestor History of aspiration pneumonia.    Length of stay, days: 9  Marletta Lor , MD 07/19/2019, 9:46 AM

## 2019-07-20 ENCOUNTER — Inpatient Hospital Stay: Payer: Medicare Other | Admitting: Internal Medicine

## 2019-07-20 ENCOUNTER — Inpatient Hospital Stay (HOSPITAL_COMMUNITY): Payer: Medicare Other | Admitting: Physical Therapy

## 2019-07-20 ENCOUNTER — Inpatient Hospital Stay (HOSPITAL_COMMUNITY): Payer: Medicare Other | Admitting: Occupational Therapy

## 2019-07-20 ENCOUNTER — Inpatient Hospital Stay (HOSPITAL_COMMUNITY): Payer: Medicare Other | Admitting: Speech Pathology

## 2019-07-20 MED ORDER — METOPROLOL TARTRATE 12.5 MG HALF TABLET
12.5000 mg | ORAL_TABLET | Freq: Two times a day (BID) | ORAL | Status: DC
  Administered 2019-07-20 – 2019-07-24 (×10): 12.5 mg via ORAL
  Filled 2019-07-20 (×11): qty 1

## 2019-07-20 NOTE — Progress Notes (Signed)
Speech Language Pathology Daily Session Note  Patient Details  Name: Kyle Garcia MRN: [id number] Date of Birth: 1951/08/06  Today's Date: 07/20/2019 SLP Individual Time: 4098-1191 SLP Individual Time Calculation (min): 85 min  Short Term Goals: Week 2: SLP Short Term Goal 1 (Week 2): Patient will consume current diet with minimal overt s/s of aspiration and Min verbal cues for use of swallowing compensatory strategies. SLP Short Term Goal 2 (Week 2): Patient will consume trials of nectar-thick liquids with minimal overt s/s of aspiration and Min A verbal cues for use of swallowing compensatory strategies. SLP Short Term Goal 3 (Week 2): Patient will demonstrate sustained attention to functional tasks for 5 minutes with Max A verbal cues for redirection. SLP Short Term Goal 4 (Week 2): Patient will initiate functional tasks with Mod A multimodal cues. SLP Short Term Goal 5 (Week 2): Patient will scan to midline to locate functional items in 5/10 opportunities.  Skilled Therapeutic Interventions: Skilled treatment session focused on dysphagia and cognitive goals. SLP facilitated session by providing skilled observation with breakfast meal of Dys. 2 textures with honey-thick liquids. Patient demonstrated intermittent overt s/s of aspiration with solid textures, suspect due to large bites and decreased awareness of bolus and required Mod verbal cues for use of swallowing compensatory strategies. Recommend continue current diet with minimal distractions. SLP also facilitated session by providing Max A verbal, tactile and visual cues for patient to locate bowl of food at midline for self-feeding and sustained attention as well as Min A verbal cues for proper placement of spoon in mouth. Self-feeding took more than a reasonable amount of time, however, patient able to locate midline with only Mod mutlimodal cues by end of meal. Patient continues to demonstrate language of confusion but is independently  oriented to place, time and situation. Patient left upright in bed with all needs within reach and alarm on. Continue with current plan of care.      Pain No/Denies Pain   Therapy/Group: Individual Therapy  Madilyne Tadlock 07/20/2019, 12:45 PM

## 2019-07-20 NOTE — Progress Notes (Addendum)
Physical Therapy Session Note  Patient Details  Name: Kyle Garcia MRN: [id number] Date of Birth: January 15, 1951  Today's Date: 07/20/2019 PT Individual Time: 1300-1410 PT Individual Time Calculation (min): 70 min   Short Term Goals: Week 2:  PT Short Term Goal 1 (Week 2): Pt will perform bed mobility with max assist +1. PT Short Term Goal 2 (Week 2): Pt will complete bed<>w/c with max assist +1. PT Short Term Goal 3 (Week 2): Pt will maintain static sitting balance with mod assist +1.  Skilled Therapeutic Interventions/Progress Updates:  Pt received in bed with wife Kyle Garcia) & daughter Kyle Garcia) present for session. Therapist took over for NT who was assisting pt with consuming lunch. Pt requires max cuing to locate utensil and bring it or cup to mouth to consume lunch. Pt with some spillage out L side of mouth. After pt consumed a few bites therapist provided max assist for oral hygiene. Educated pt's family on pt's impairments & CLOF & anticipated DME needs, as well as impaired sustained attention to all tasks. Pt is able to locate wife who is standing at foot of bed & pt does not require any cuing to scan to midline. Pt requires +2 assist for rolling, sidelying>sitting, sit>supine despite therapist's attempts to have pt use RUE assist in movement. Pt transfers bed<>w/c and w/c<>mat table with slide board +2 assist as pt continues to be unable to use RUE to assist with pushing. While sitting on mat table focused on static sitting balance with pt requiring as little as mod assist at times. Pt requires tactile cue (pillow behind him) to hold his head upright vs fully flexed. Pt with very poor ability to make eye contact with therapist in front of him and is unable to consistently & accurately scan to midline. Pt requires max cuing to hold RUE in lap to prevent pt from pushing L. Pt with improvement in ability to locate colored bean bag held in front of him on this date.  Pt reports need to have BM so  assisted back to room & back to bed. Pt requires total assist for clothing management & bed pan placement. Pt left in bed in care of NT.  Pain: pt continues to cry out in pain with bed mobility with c/o pain in LLE on this date & repositioning & rest breaks provided PRN.   Addendum: During session pt was able to transfer sit<>stand from [facility] with +2 assist and ambulate 10 ft with three muskateers assistance with therapist providing dependent assist for advancing LLE as no active movement or attempts made from pt. Pt demonstrates significant tone in LLE when attempting to advance LLE and therapist provides blocking at L knee to prevent buckling. Pt requires MAX assist to upright posture but continues to not hold head up.   Therapy Documentation Precautions:  Precautions Precautions: Fall Precaution Comments: Dense Lt hemi, pusher Restrictions Weight Bearing Restrictions: No    Therapy/Group: Individual Therapy  [name] 07/20/2019, 3:55 PM

## 2019-07-20 NOTE — Progress Notes (Signed)
[facility] PHYSICAL MEDICINE & REHABILITATION PROGRESS NOTE   Subjective/Complaints: Up in bed. No new problems per RN  ROS: Limited due to cognitive/behavioral      Objective:   No results found. No results for input(s): WBC, HGB, HCT, PLT in the last 72 hours. No results for input(s): NA, K, CL, CO2, GLUCOSE, BUN, CREATININE, CALCIUM in the last 72 hours.  Intake/Output Summary (Last 24 hours) at 07/20/2019 1043 Last data filed at 07/20/2019 0900 Gross per 24 hour  Intake 1564.02 ml  Output -  Net 1564.02 ml     Physical Exam: Vital Signs Blood pressure 106/69, pulse (!) 120, temperature 98.8 F (37.1 C), resp. rate 16, weight 62.1 kg, SpO2 96 %.   Constitutional: No distress . Vital signs reviewed. HEENT: EOMI, oral membranes moist Neck: supple Cardiovascular: RRR without murmur. No JVD    Respiratory: CTA Bilaterally without wheezes or rales. Normal effort    GI: BS +, non-tender, non-distended  Extremities: No clubbing, cyanosis, or edema Skin: No evidence of breakdown, no evidence of rash Neurologic: a little more attentive. continued Right gaze preference.    motor strength is 5/5 in Right and 0/5 left  deltoid, bicep, tricep, grip, hip flexor, knee extensors, ankle dorsiflexor and plantar flexor LLE in figure four--has some discomfort when ranged to extension Sensory exam: decreased pain sense LUE and LLE  Musculoskeletal: no swelling Psych: flat but engages more  Assessment/Plan: 1. Functional deficits secondary to Right MCA infarct with dysphagia  which require 3+ hours per day of interdisciplinary therapy in a comprehensive inpatient rehab setting.  Physiatrist is providing close team supervision and 24 hour management of active medical problems listed below.  Physiatrist and rehab team continue to assess barriers to discharge/monitor patient progress toward functional and medical goals  Care Tool:  Bathing    Body parts bathed by patient: Chest,  Abdomen, Face, Right lower leg   Body parts bathed by helper: Right arm, Left arm, Front perineal area, Buttocks, Right upper leg, Left upper leg, Left lower leg     Bathing assist Assist Level: Maximal Assistance - Patient 24 - 49%     Upper Body Dressing/Undressing Upper body dressing   What is the patient wearing?: Pull over shirt    Upper body assist Assist Level: Total Assistance - Patient < 25%    Lower Body Dressing/Undressing Lower body dressing      What is the patient wearing?: Pants, Incontinence brief     Lower body assist Assist for lower body dressing: Dependent - Patient 0%     Toileting Toileting    Toileting assist Assist for toileting: Dependent - Patient 0%     Transfers Chair/bed transfer  Transfers assist     Chair/bed transfer assist level: Total Assistance - Patient < 25%     Locomotion Ambulation   Ambulation assist   Ambulation activity did not occur: Safety/medical concerns          Walk 10 feet activity   Assist  Walk 10 feet activity did not occur: Safety/medical concerns        Walk 50 feet activity   Assist Walk 50 feet with 2 turns activity did not occur: Safety/medical concerns         Walk 150 feet activity   Assist Walk 150 feet activity did not occur: Safety/medical concerns         Walk 10 feet on uneven surface  activity   Assist Walk 10 feet on uneven surfaces  activity did not occur: Safety/medical concerns         Wheelchair     Assist Will patient use wheelchair at discharge?: Yes Type of Wheelchair: Manual    Wheelchair assist level: Dependent - Patient 0%(TIS w/c) Max wheelchair distance: 150 ft    Wheelchair 50 feet with 2 turns activity    Assist        Assist Level: Dependent - Patient 0%   Wheelchair 150 feet activity     Assist     Assist Level: Dependent - Patient 0%    Medical Problem List and Plan: 1.Dense lefthemiparesiswith  dysphagiasecondary to embolic shower right MCA occlusion status post reperfusion. Infarct felt to be embolic secondary to hypercoagulable state from lung cancer -CIR PT, OT,  SLP   -PRAFO, WHO Left side  -significant visual-spatial deficits on left remain 2. Antithrombotics: -DVT/anticoagulation/history of right lower extremity TGG:YIRSWNI -antiplatelet therapy: N/A 3. Pain Management:Tylenol as needed  -antispasmodics   -continue low dose baclofen--increased to  5mg  TID   -orthotics 4. Mood:Provide emotional support -antipsychotic agents: N/A  -HR remains elevated, continue ritalin 5mg  5. Neuropsych: This patientiscapable of making decisions on hisown behalf. 6. Skin/Wound Care:Routine skin checks 7. Fluids/Electrolytes/Nutrition:   -continue IVF  -BUN/Cr holding--no change in plan 8.Stage IV adenocarcinoma right lung. Chemotherapy as per [name] 9. Aspiration pneumonia. Continue Unasyn initiated 07/08/2019 x 5-day course. Afebrile, WBC 8.3  -aspiration precautions 10. Dysphagia. Dysphasia #2 honey thick liquids.   -advance diet per SLP  -IVF HS  -recheck labs this week 11. Hyperlipidemia. Crestor 12. Remote tobacco abuse. Counseling 13.Acute on chronic anemia. Hgb slightly up at 8.3 will cont to monitor   14 Hypocalcemia- improved  -likely related to #8 15. Tachycardia: begin low dose lopressor 12.5mg  BID     LOS: 10 days A FACE TO FACE EVALUATION WAS PERFORMED  [name] 07/20/2019, 10:43 AM

## 2019-07-20 NOTE — Progress Notes (Signed)
Occupational Therapy Session Note  Patient Details  Name: Kyle Garcia MRN: [id number] Date of Birth: 1951-08-18  Today's Date: 07/20/2019 OT Individual Time: 1100-1200 OT Individual Time Calculation (min): 60 min    Short Term Goals: Week 1:  OT Short Term Goal 1 (Week 1): Pt will complete 1 grooming task EOB with 1 assist for sitting balance OT Short Term Goal 1 - Progress (Week 1): Progressing toward goal OT Short Term Goal 2 (Week 1): Pt will initiate washing affected arm with max vcs OT Short Term Goal 2 - Progress (Week 1): Progressing toward goal OT Short Term Goal 3 (Week 1): Pt will exhibit improved sustained attention by initiating placing 1 limb into UB garment OT Short Term Goal 3 - Progress (Week 1): Not met Week 2:  OT Short Term Goal 1 (Week 2): Pt will locate 1 item at midline with MAX VC OT Short Term Goal 2 (Week 2): Pt will maintain static sitting balance for 10 seconds with MOD A and max cuing OT Short Term Goal 3 (Week 2): Pt will sustain attention to wash 1 body part wiht min VC  Skilled Therapeutic Interventions/Progress Updates:    Pt seen this session for family education with his spouse and daughter.  They have not had the opportunity to observe him and see how he is functioning.  Pt received in bed and had a soiled with BM brief. Pt unaware that he had a BM.  Pt worked on rolling with L / R with total A, total A with LB cleansing and dressing, total A with sidelying to sit.  Once sitting pt severely leaning to the L flexed at his waist with his head over his L knee.  Therapist had to hold pt with total A. Attempted numerous strategies to correct posture and balance and pt instead would just push away more.  RN assisted this therapist with a total A squat pivot to TIS w/c.   Once in wc pt worked on UB bathing and dressing with max A.  Total  A to turn head to find self in mirror.   Spoke at length and demonstrated to wife and daughter patient's challenges with  attention, sensory awareness, perceptual skills, visual deficits, postural control/ awareness, apraxia. Demonstrated these deficits with bed mobility, sit balance, transfers, self care.  Explained that his recovery will be very slow and very lengthy.  Due to the deficits he will likely need a significant amount of care at home for a significant time period.  RN in room with pt to place belt alarm on pt.    Therapy Documentation Precautions:  Precautions Precautions: Fall Precaution Comments: Dense Lt hemi, pusher Restrictions Weight Bearing Restrictions: No  Vital Signs: Therapy Vitals Temp: 98.8 F (37.1 C) Pulse Rate: (!) 120 BP: 106/69 Patient Position (if appropriate): Lying Oxygen Therapy SpO2: 96 % O2 Device: Room Air Pain: Inconsistent pain reports, pt could not identify where he had pain        Therapy/Group: Individual Therapy  [employer] 07/20/2019, 8:32 AM

## 2019-07-20 NOTE — Progress Notes (Signed)
Nutrition Follow-up  DOCUMENTATION CODES:   Not applicable  INTERVENTION:  -Continue Ensure Enlive po BID, each supplement provides 350 kcal and 20 grams of protein Thickened to honey-thick consistency -Continue Magic cup TID with meals, each supplement provides 290 kcal and 9 grams of protein -Continue MVI with minerals daily -Monitor for SLP recommendations for diet advancements  NEW NUTRITION DIAGNOSIS:   Increased nutrient needs related to lethargy/confusion(Recent CVA; CIR for rehab) as evidenced by estimated needs.  GOAL:   Patient will meet greater than or equal to 90% of their needs  Progressing; patient eating 85% of the past 8 recorded meals   MONITOR:   PO intake, Labs, I & O's, Supplement acceptance, Weight trends, Diet advancement, TF tolerance  REASON FOR ASSESSMENT:   Malnutrition Screening Tool    ASSESSMENT:  RD working remotely.  68 y/o male HLD, GERD, Stage IV lung cancer and recurrent strokes. Presents to CIR following hospitalization 2/92-4/46 for embolic shower and R MCA occlusion s/p attempted mechanical thrombectomy of multiple vessels.   Patient seen by SLP today - recommendations for continuation of DYS2; honey thick  Patient with good po; eating 65 - 100% of last 8 recorded meals  NUTRITION - FOCUSED PHYSICAL EXAM: Unable to complete at this time   Diet Order:   Diet Order            DIET DYS 2 Room service appropriate? Yes; Fluid consistency: Honey Thick  Diet effective now              EDUCATION NEEDS:   No education needs have been identified at this time  Skin:  Skin Assessment: Reviewed RN Assessment(MASD to groin)  Last BM:  7/26  Height:   Ht Readings from Last 1 Encounters:  07/03/19 5\' 11"  (1.803 m)    Weight:   Wt Readings from Last 1 Encounters:  07/16/19 62.1 kg    Ideal Body Weight:  78.2 kg  BMI:  Body mass index is 19.09 kg/m.  Estimated Nutritional Needs:   Kcal:  1850-2050  Protein:   93-103  Fluid:  >1.8L   Lajuan Lines, RD, LDN  After Hours/Weekend Pager: [phone]

## 2019-07-21 ENCOUNTER — Inpatient Hospital Stay (HOSPITAL_COMMUNITY): Payer: Medicare Other | Admitting: Physical Therapy

## 2019-07-21 ENCOUNTER — Inpatient Hospital Stay (HOSPITAL_COMMUNITY): Payer: Medicare Other

## 2019-07-21 ENCOUNTER — Inpatient Hospital Stay (HOSPITAL_COMMUNITY): Payer: Medicare Other | Admitting: Speech Pathology

## 2019-07-21 ENCOUNTER — Inpatient Hospital Stay (HOSPITAL_COMMUNITY): Payer: Medicare Other | Admitting: Occupational Therapy

## 2019-07-21 LAB — BASIC METABOLIC PANEL
Anion gap: 10 (ref 5–15)
BUN: 18 mg/dL (ref 8–23)
CO2: 23 mmol/L (ref 22–32)
Calcium: 8.2 mg/dL — ABNORMAL LOW (ref 8.9–10.3)
Chloride: 100 mmol/L (ref 98–111)
Creatinine, Ser: 0.62 mg/dL (ref 0.61–1.24)
GFR calc Af Amer: >60 mL/min (ref >60)
GFR calc non Af Amer: >60 mL/min (ref >60)
Glucose, Bld: 108 mg/dL — ABNORMAL HIGH (ref 70–99)
Potassium: 3.8 mmol/L (ref 3.5–5.1)
Sodium: 133 mmol/L — ABNORMAL LOW (ref 135–145)

## 2019-07-21 LAB — CBC
HCT: 28.1 % — ABNORMAL LOW (ref 39.0–52.0)
Hemoglobin: 8.9 g/dL — ABNORMAL LOW (ref 13.0–17.0)
MCH: 26.8 pg (ref 26.0–34.0)
MCHC: 31.7 g/dL (ref 30.0–36.0)
MCV: 84.6 fL (ref 80.0–100.0)
Platelets: 230 10*3/uL (ref 150–400)
RBC: 3.32 MIL/uL — ABNORMAL LOW (ref 4.22–5.81)
RDW: 16.1 % — ABNORMAL HIGH (ref 11.5–15.5)
WBC: 9.6 10*3/uL (ref 4.0–10.5)
nRBC: 0 % (ref 0.0–0.2)

## 2019-07-21 NOTE — Patient Care Conference (Signed)
Inpatient RehabilitationTeam Conference and Plan of Care Update Date: 07/21/2019   Time: 2:54 PM    Patient Name: Kyle Garcia      Medical Record Number: [id number]  Date of Birth: [birth year] Sex: Male         Room/Bed: 4W20C/4W20C-01 Payor Info: Payor: MEDICARE / Plan: MEDICARE PART A AND B / Product Type: *No Product type* /    Admitting Diagnosis: 2. TBI Team  RT. CVA; 22-24days  Admit Date/Time:  07/10/2019  4:34 PM Admission Comments: No comment available   Primary Diagnosis:  <principal problem not specified> Principal Problem: <principal problem not specified>  Patient Active Problem List   Diagnosis Date Noted  . Hx of ischemic multifocal multiple vascular territories stroke 07/10/2019  . Dysphagia due to recent cerebrovascular accident 07/10/2019  . Fever 07/10/2019  . Right middle cerebral artery stroke ([place]) 07/10/2019  . Acute right arterial ischemic stroke, middle cerebral artery (MCA) ([place]) s/p mechanical thrombectomy 07/03/2019  . Middle cerebral artery embolism, right 07/03/2019  . CAP (community acquired pneumonia) 06/20/2019  . Stroke ([place])   . Stroke due to embolism ([place]) 06/17/2019  . Encounter for antineoplastic chemotherapy 06/05/2019  . Encounter for antineoplastic immunotherapy 06/05/2019  . Goals of care, counseling/discussion 06/05/2019  . Adenocarcinoma of right lung, stage 4 ([place]) 06/04/2019  . Cough 05/21/2019  . S/P bronchoscopy with biopsy   . DVT (deep venous thrombosis) ([place]) 05/08/2019  . Weight loss 04/28/2019  . Aspiration pneumonia ([place]) 04/10/2019  . GERD without esophagitis 11/14/2015  . Hyperlipidemia 11/14/2015  . Benign prostatic hyperplasia 12/24/2008    Expected Discharge Date: Expected Discharge Date: (3 more weeks)  Team Members Present: Physician leading conference: Dr. Alger Simons Social Worker Present: Ovidio Kin, LCSW Nurse Present: Rayetta Humphrey, RN PT Present: Lavone Nian, PT OT Present: Laverle Hobby, OT SLP  Present: Weston Anna, SLP PPS Coordinator present : Ileana Ladd, Burna Mortimer, SLP     Current Status/Progress Goal Weekly Team Focus  Medical   Right MCA infarct. still with left hemiparesis and visual-spatial deficits. onc treatments on hold.  improve awareness of left side  improve pain levels, increase engagement of left side. maintain arousal   Bowel/Bladder   Continent x2 with periods of incontince LBM 7/28  fewer eposides of incontinence  assess pain qshift and PRN   Swallow/Nutrition/ Hydration   Dys. 1 textures with honey-thick liquids, Mod I  Min A  attention to task, increased use of swallowing strategies   ADL's   TOTAL A ADLS, TOTAL A +2 all mobility  MOD A  focused-sustained attention, postural control, L NMR, midline orientation, one step commands   Mobility   +2 bed mobility and slide board transfers, +2 gait x 10 ft with 3 muskateers style, impaired cognition, visual spatial deficits, impaired midline  min assist bed mobility, mod assist transfers, max assist gait x 10 ft with therapy, supervision w/c mobility  midline orientation, sitting balance, NMR, cognition (attention, awareness), transers, bed mobility, positioning, OOB tolerance   Communication             Safety/Cognition/ Behavioral Observations  Max-Total A  Min A  attention, following commands, scanning to left   Pain   pt complains of spasms to back. relived with schedlued boclofen  pain less tha 2  assess apin qshift and PRN   Skin   MASD to peri area. barieir cream applied  Keep skin free of infection and prevent further breakdown.  assess skin qshifta nd PRN      *  See Care Plan and progress notes for long and short-term goals.     Barriers to Discharge  Current Status/Progress Possible Resolutions Date Resolved   Physician    Medical stability        see medical progress notes      Nursing                  PT  Inaccessible home environment;Nutrition means;Pending  chemo/radiation;Incontinence                 OT                  SLP                SW                Discharge Planning/Teaching Needs:    Home with wife and daughter to assist if able to manage his care needs. Here on Monday to see him in therapies.     Team Discussion:  Slow progress in therapies due to severity of his stroke. Weak from chemo treatment prior to admission. On ritalin for alertness and baclofen for spasms-MD monitoring. Chemo on hold until DC-MD spoke with oncologist. Currently plus two total assist. May need to downgrade goals. Cognitively poor this week-perserevating today with Speech.   Revisions to Treatment Plan:ELOS three weeks    Continued Need for Acute Rehabilitation Level of Care: The patient requires daily medical management by a physician with specialized training in physical medicine and rehabilitation for the following conditions: Daily direction of a multidisciplinary physical rehabilitation program to ensure safe treatment while eliciting the highest outcome that is of practical value to the patient.: Yes Daily medical management of patient stability for increased activity during participation in an intensive rehabilitation regime.: Yes Daily analysis of laboratory values and/or radiology reports with any subsequent need for medication adjustment of medical intervention for : Neurological problems;Cardiac problems;Other   I attest that I was present, lead the team conference, and concur with the assessment and plan of the team. Teleconference held due to COVID 19   Juanice Warburton, Gardiner Rhyme 07/21/2019, 2:54 PM

## 2019-07-21 NOTE — Progress Notes (Signed)
[place] PHYSICAL MEDICINE & REHABILITATION PROGRESS NOTE   Subjective/Complaints: Sitting up in bed. No new complaints. Today.   ROS: Limited due to cognitive/behavioral       Objective:   No results found. Recent Labs    07/21/19 0701  WBC 9.6  HGB 8.9*  HCT 28.1*  PLT 230   Recent Labs    07/21/19 0701  NA 133*  K 3.8  CL 100  CO2 23  GLUCOSE 108*  BUN 18  CREATININE 0.62  CALCIUM 8.2*    Intake/Output Summary (Last 24 hours) at 07/21/2019 1518 Last data filed at 07/21/2019 0900 Gross per 24 hour  Intake 240 ml  Output -  Net 240 ml     Physical Exam: Vital Signs Blood pressure 116/67, pulse (!) 114, temperature 98.1 F (36.7 C), temperature source Oral, resp. rate 18, height 5\' 11"  (1.803 m), weight 62.1 kg, SpO2 98 %.   Constitutional: No distress . Vital signs reviewed. HEENT: EOMI, oral membranes moist Neck: supple Cardiovascular: RRR without murmur. No JVD    Respiratory: CTA Bilaterally without wheezes or rales. Normal effort    GI: BS +, non-tender, non-distended   Extremities: No clubbing, cyanosis, or edema Skin: No evidence of breakdown, no evidence of rash Neurologic: a little more attentive. continued Right gaze preference.    motor strength is 5/5 in Right and 0/5 left  deltoid, bicep, tricep, grip, hip flexor, knee extensors, ankle dorsiflexor and plantar flexor--neuro exam without sig change LLE can be stretched into extension Sensory exam: decreased pain sense LUE and LLE  Musculoskeletal: no swelling Psych: more engaged in conversation  Assessment/Plan: 1. Functional deficits secondary to Right MCA infarct with dysphagia  which require 3+ hours per day of interdisciplinary therapy in a comprehensive inpatient rehab setting.  Physiatrist is providing close team supervision and 24 hour management of active medical problems listed below.  Physiatrist and rehab team continue to assess barriers to discharge/monitor patient progress  toward functional and medical goals  Care Tool:  Bathing    Body parts bathed by patient: Chest, Abdomen, Face, Right lower leg   Body parts bathed by helper: Chest, Abdomen, Face     Bathing assist Assist Level: Moderate Assistance - Patient 50 - 74%     Upper Body Dressing/Undressing Upper body dressing   What is the patient wearing?: Pull over shirt    Upper body assist Assist Level: Maximal Assistance - Patient 25 - 49%    Lower Body Dressing/Undressing Lower body dressing      What is the patient wearing?: Pants, Incontinence brief     Lower body assist Assist for lower body dressing: Dependent - Patient 0%     Toileting Toileting    Toileting assist Assist for toileting: Dependent - Patient 0%     Transfers Chair/bed transfer  Transfers assist     Chair/bed transfer assist level: 2 Helpers     Locomotion Ambulation   Ambulation assist   Ambulation activity did not occur: Safety/medical concerns  Assist level: 2 helpers Assistive device: (3 muskateers) Max distance: 8 ft   Walk 10 feet activity   Assist  Walk 10 feet activity did not occur: Safety/medical concerns  Assist level: 2 helpers Assistive device: (3 muskateers)   Walk 50 feet activity   Assist Walk 50 feet with 2 turns activity did not occur: Safety/medical concerns         Walk 150 feet activity   Assist Walk 150 feet activity did not  occur: Safety/medical concerns         Walk 10 feet on uneven surface  activity   Assist Walk 10 feet on uneven surfaces activity did not occur: Safety/medical concerns         Wheelchair     Assist Will patient use wheelchair at discharge?: Yes Type of Wheelchair: Manual    Wheelchair assist level: Dependent - Patient 0%(TIS w/c) Max wheelchair distance: 150 ft    Wheelchair 50 feet with 2 turns activity    Assist        Assist Level: Dependent - Patient 0%   Wheelchair 150 feet activity     Assist      Assist Level: Dependent - Patient 0%    Medical Problem List and Plan: 1.Dense lefthemiparesiswith dysphagiasecondary to embolic shower right MCA occlusion status post reperfusion. Infarct felt to be embolic secondary to hypercoagulable state from lung cancer -CIR PT, OT,  SLP   -PRAFO, WHO Left side  -significant visual-spatial deficits on left remain  -participation can fluctuate d/t neuro status/fatigue 2. Antithrombotics: -DVT/anticoagulation/history of right lower extremity HQR:FXJOITG -antiplatelet therapy: N/A 3. Pain Management:Tylenol as needed  -antispasmodics   -continue low dose baclofen--increased to  5mg  TID   -orthotics 4. Mood:Provide emotional support -antipsychotic agents: N/A  -HR remains elevated, continue ritalin 5mg  5. Neuropsych: This patientiscapable of making decisions on hisown behalf. 6. Skin/Wound Care:Routine skin checks 7. Fluids/Electrolytes/Nutrition:   -continue IVF  -BUN/Cr holding--no change in plan 8.Stage IV adenocarcinoma right lung. Chemotherapy as per [name]  -onc appts on temporary hold until after rehab discharge 9. Aspiration pneumonia.   Unasyn completed  . Afebrile, WBC 8.3  -aspiration precautions 10. Dysphagia. Dysphasia #2 honey thick liquids.   -advance diet per SLP  -IVF HS  -recheck labs  Thursday potentially 11. Hyperlipidemia. Crestor 12. Remote tobacco abuse. Counseling 13.Acute on chronic anemia. Hgb slightly up at 8.3 will cont to monitor   14 Hypocalcemia- improved  -likely related to #8 15. Tachycardia:  lopressor 12.5mg  BID  -BP holding, increase to 25mg  bid tonight     LOS: 11 days A FACE TO FACE EVALUATION WAS PERFORMED  [name] 07/21/2019, 3:18 PM

## 2019-07-21 NOTE — Progress Notes (Signed)
Occupational Therapy Session Note  Patient Details  Name: Kyle Garcia MRN: [id number] Date of Birth: 02/15/1951  Today's Date: 07/21/2019 OT Individual Time: 0930-1000 OT Individual Time Calculation (min): 30 min    Short Term Goals: Week 1:  OT Short Term Goal 1 (Week 1): Pt will complete 1 grooming task EOB with 1 assist for sitting balance OT Short Term Goal 1 - Progress (Week 1): Progressing toward goal OT Short Term Goal 2 (Week 1): Pt will initiate washing affected arm with max vcs OT Short Term Goal 2 - Progress (Week 1): Progressing toward goal OT Short Term Goal 3 (Week 1): Pt will exhibit improved sustained attention by initiating placing 1 limb into UB garment OT Short Term Goal 3 - Progress (Week 1): Not met Week 2:  OT Short Term Goal 1 (Week 2): Pt will locate 1 item at midline with MAX VC OT Short Term Goal 2 (Week 2): Pt will maintain static sitting balance for 10 seconds with MOD A and max cuing OT Short Term Goal 3 (Week 2): Pt will sustain attention to wash 1 body part wiht min VC      Skilled Therapeutic Interventions/Progress Updates:    Pt received in bed leaning over to the L side of the bed stating that is son in law (who was not present) was talking to him about things and he kept trying to tell his son in law to come over to him.  Pt kept saying he was here in the room.  Pt need to have pants placed on so worked on pt grasping pant waistband and pulling up to his hips with max hand over hand A while he was supine. Pt tolerated being flat in supine with no complaints and tolerated some rolling.  From this position, scapular gliding with resistance due to high tone.  Gentle shoulder ROM.  Pt unable to feel touch to LUE even with hard pressure or rubbing.   Pt's head of bed elevated slightly and had pt grasp hands together for B hand raises and moving side to side with hand over hand facilitation.  Today pt keeping his head turned to the L and not allowing his head to  be rotated to the R.  Adjusted pt fully upright in chair position in  Bed with pt maintaining midline with the back support.   Speech therapist arrived for his next session.  Therapy Documentation Precautions:  Precautions Precautions: Fall Precaution Comments: Dense Lt hemi, pusher Restrictions Weight Bearing Restrictions: No    Vital Signs: Therapy Vitals Temp: 98.2 F (36.8 C) Temp Source: Oral Pulse Rate: 88 Resp: 15 BP: 111/60 Patient Position (if appropriate): Lying Oxygen Therapy SpO2: 96 % O2 Device: Room Air Pain:   ADL: ADL Eating: Not assessed Grooming: Dependent(washing hands) Where Assessed-Grooming: Bed level Upper Body Bathing: Dependent Where Assessed-Upper Body Bathing: Bed level Lower Body Bathing: Dependent Where Assessed-Lower Body Bathing: Bed level Upper Body Dressing: Dependent Where Assessed-Upper Body Dressing: Bed level Lower Body Dressing: Dependent Where Assessed-Lower Body Dressing: Bed level Toileting: Dependent Where Assessed-Toileting: Bed level Toilet Transfer: Not assessed Tub/Shower Transfer: Not assessed   Therapy/Group: Individual Therapy  SAGUIER,JULIA 07/21/2019, 8:30 AM  

## 2019-07-21 NOTE — Progress Notes (Signed)
Occupational Therapy Session Note  Patient Details  Name: Kyle Garcia MRN: [id number] Date of Birth: May 16, 1951  Today's Date: 07/21/2019 OT Individual Time: 4098-1191 OT Individual Time Calculation (min): 60 min   Session 2: OT Individual Time: 4782-9562 OT Individual Time Calculation (min): 45 min    Short Term Goals: Week 2:  OT Short Term Goal 1 (Week 2): Pt will locate 1 item at midline with MAX VC OT Short Term Goal 2 (Week 2): Pt will maintain static sitting balance for 10 seconds with MOD A and max cuing OT Short Term Goal 3 (Week 2): Pt will sustain attention to wash 1 body part wiht min VC  Skilled Therapeutic Interventions/Progress Updates:    Pt received supine with no immediate c/o pain. Pt presenting with LUE and LLE flexor tone, as well as L gaze and head turn. Despite maximal cueing and positioning pt unable to maintain any form of neutral positioning. Pt pleasant and making appropriate jokes throughout session. Pt had small incontinent BM in brief and required total A to roll toward the L and for brief change. Pt c/o severe back pain when attempting to roll R and was unable to tolerate even log rolling technique to fasten brief. Pt required HOH to release grip from bed rail to return to supine from side lying. +2 assistance acquired to pull pt up in bed to optimize positioning for eating breakfast. Pt required max HOH to load spoon and bring to mouth, requiring assist to rotate spoon to correctly bring to mouth 2/2 apraxia. Pt coughing throughout session, before during and after eating. Wet vocal quality present after 25% of magic cup was consumed, with cueing provided to clear throat and double swallow. Pt reported needing to have BM and d/t lack of +2 assistance, bed pan was obtained. Bed pan positioned with pt still sidelying and unable to come fully supine 2/2 back pain. Pt took several minutes to attempt and void. Pt was repositioned in supine with HOB elevated. Pt  completed last several bites of breakfast and a thickened coffee was prepared for him. NT alerted to take over feeding assistance. Pt left supine with all needs met, bed alarm set.   Session 2:  Pt received supine with RN present. Pt with limited interaction with therapist and RN initially, tracking both around the room and eyes wide open but not answering questions or following commands. Vitals obtained, HR at 111 bpm but all other WFL. Pt eventually perked up and began interacting. Pt transitioned to EOB with mod A. He sat EOB, pushing heavily toward the L side. Pt's L UE was placed into arm extended weight bearing position to increase tactile and proprioceptive input in the shoulder. Pt had intermittent moments of requiring only CGA for sitting balance when reaching toward the R and forcing weight shift over R hip,but otherwise required max-total A. Pt returned to supine d/t back pain increasing. Pt was assisted in eating lunch. All items were placed in his R visual field to facilitate head turn toward the R. Max A overall to eat. Pt was left supine with all needs met, bed alarm set.   Therapy Documentation Precautions:  Precautions Precautions: Fall Precaution Comments: Dense Lt hemi, pusher Restrictions Weight Bearing Restrictions: No   Therapy/Group: Individual Therapy  [name] 07/21/2019, 7:14 AM

## 2019-07-21 NOTE — Progress Notes (Signed)
Physical Therapy Session Note  Patient Details  Name: Kyle Garcia MRN: [id number] Date of Birth: 10/16/1951  Today's Date: 07/21/2019 PT Individual Time: 1117-1159 PT Individual Time Calculation (min): 42 min   Short Term Goals: Week 2:  PT Short Term Goal 1 (Week 2): Pt will perform bed mobility with max assist +1. PT Short Term Goal 2 (Week 2): Pt will complete bed<>w/c with max assist +1. PT Short Term Goal 3 (Week 2): Pt will maintain static sitting balance with mod assist +1.  Skilled Therapeutic Interventions/Progress Updates:  Pt received asleep in bed and very lethargic throughout session requiring max multimodal cuing and stimulation to awaken & engage with therapist. Therapist dons/doffs socks/shoes dependent assist for time management. Pt completes bed mobility with +2 dependent assist. Session focused on tracking to midline, focusing eyes on object in front of him, BLE strengthening & NMR through forced use in standing & gait, upright posture (holding head up), midline orientation & balance. Pt transfers sit<>stand multiple times throughout session with +2 total assist with therapist blocking B knees to prevent buckling and max assist to facilitate upright posture but pt continues to hold head fully flexed and unable to self correct without assistance. Pt transfers bed<>w/c and w/c<>mat table via ambulation with 3 muskateers assistance with pt intermittently able to advance RLE without assistance, at times pt unable to coordinate stepping. Pt requires total assist to advance LLE and demonstrates significant flexor tone when therapist lifts LLE to advance it. When sitting EOM therapist places RUE on ball to reduce pt's tendency to push himself over to L and pt able to briefly maintain static sitting balance with as little as CGA but requires assistance to return to midline. Pt noted to have incontinent smear BM and at end of session was assisted back to bed. Pt left in bed with all needs  in reach & bed alarm set, notified nursing staff of pt's need to be cleaned up.   Pt with c/o pain in back and all over with repositioning & rest breaks provided PRN.   Therapy Documentation Precautions:  Precautions Precautions: Fall Precaution Comments: Dense Lt hemi, pusher Restrictions Weight Bearing Restrictions: No    Therapy/Group: Individual Therapy  [name] 07/21/2019, 12:24 PM

## 2019-07-21 NOTE — Progress Notes (Addendum)
Speech Language Pathology Daily Session Note  Patient Details  Name: Kyle Garcia MRN: [id number] Date of Birth: 10-09-1951  Today's Date: 07/21/2019 SLP Individual Time: 1000-1045 SLP Individual Time Calculation (min): 45 min  Short Term Goals: Week 2: SLP Short Term Goal 1 (Week 2): Patient will consume current diet with minimal overt s/s of aspiration and Min verbal cues for use of swallowing compensatory strategies. SLP Short Term Goal 2 (Week 2): Patient will consume trials of nectar-thick liquids with minimal overt s/s of aspiration and Min A verbal cues for use of swallowing compensatory strategies. SLP Short Term Goal 3 (Week 2): Patient will demonstrate sustained attention to functional tasks for 5 minutes with Max A verbal cues for redirection. SLP Short Term Goal 4 (Week 2): Patient will initiate functional tasks with Mod A multimodal cues. SLP Short Term Goal 5 (Week 2): Patient will scan to midline to locate functional items in 5/10 opportunities.  Skilled Therapeutic Interventions: Skilled treatment session focused on dysphagia and cognitive goals. SLP facilitated session by providing trials of nectar-thick liquids via tsp after oral care. Patient with decreased attention to bolus and required more than a reasonable amount of time and Max verbal cues to initiate a swallow resulting in delayed coughing. Recommend continued trials with SLP. SLP also facilitated session by providing Mod A for patient to scan to the left towards midline to place functional items on a piece of bright tape that was placed on his table. Despite increased ability to scan towards midline, patient required Max-Total A multimodal cues to follow commands throughout task. Patient independently requested to have a bowel movement and was transferred to a roll in shower chair with +2 assist and Max verbal and tactile cues for safety. Patient required Max multimodal cues to attend to task but eventually had a successful  bowel movement. Patient transferred back to bed and left upright in bed with alarm on and all needs within reach. Continue with current plan of care.      Pain Intermittent pain with mobility   Therapy/Group: Individual Therapy  [name] 07/21/2019, 12:34 PM

## 2019-07-22 ENCOUNTER — Inpatient Hospital Stay (HOSPITAL_COMMUNITY): Payer: Medicare Other | Admitting: Physical Therapy

## 2019-07-22 ENCOUNTER — Inpatient Hospital Stay (HOSPITAL_COMMUNITY): Payer: Medicare Other

## 2019-07-22 ENCOUNTER — Inpatient Hospital Stay (HOSPITAL_COMMUNITY): Payer: Medicare Other | Admitting: Occupational Therapy

## 2019-07-22 ENCOUNTER — Inpatient Hospital Stay: Payer: Medicare Other

## 2019-07-22 ENCOUNTER — Inpatient Hospital Stay: Payer: Medicare Other | Admitting: Physician Assistant

## 2019-07-22 ENCOUNTER — Inpatient Hospital Stay (HOSPITAL_COMMUNITY): Payer: Medicare Other | Admitting: Speech Pathology

## 2019-07-22 MED ORDER — METHYLPHENIDATE HCL 5 MG PO TABS
10.0000 mg | ORAL_TABLET | Freq: Two times a day (BID) | ORAL | Status: DC
  Administered 2019-07-22 – 2019-07-25 (×6): 10 mg via ORAL
  Filled 2019-07-22 (×6): qty 2

## 2019-07-22 MED ORDER — BACLOFEN 5 MG HALF TABLET
5.0000 mg | ORAL_TABLET | Freq: Three times a day (TID) | ORAL | Status: DC
  Administered 2019-07-22 – 2019-07-25 (×10): 5 mg via ORAL
  Filled 2019-07-22 (×10): qty 1

## 2019-07-22 NOTE — Progress Notes (Signed)
Occupational Therapy Session Note  Patient Details  Name: Kyle Garcia MRN: [id number] Date of Birth: 13-Aug-1951  Today's Date: 07/22/2019 OT Individual Time: 1258-1330 OT Individual Time Calculation (min): 32 min    Short Term Goals: Week 2:  OT Short Term Goal 1 (Week 2): Pt will locate 1 item at midline with MAX VC OT Short Term Goal 2 (Week 2): Pt will maintain static sitting balance for 10 seconds with MOD A and max cuing OT Short Term Goal 3 (Week 2): Pt will sustain attention to wash 1 body part wiht min VC  Skilled Therapeutic Interventions/Progress Updates:    patient seated in w/c, alert, transfer to edge of bed, sit pivot max A of one with stand by of 2nd.  Trunk mobility activities seated edge of bed with max A, lateral leans on elbows with focus on head and trunk positioning - noted improved neck and scapular mobility with lean to right elbow.  SSP to supine with max A.  Rolling side to side max A.  Hygiene and clothing mgmt after incontinent episode - dependent.  Patient remained in bed at close of session.  Bed alarm set  Therapy Documentation Precautions:  Precautions Precautions: Fall Precaution Comments: Dense Lt hemi, pusher Restrictions Weight Bearing Restrictions: No General:   Vital Signs: Therapy Vitals Temp: 99 F (37.2 C) Temp Source: Oral Pulse Rate: (!) 116 Resp: 20 BP: 101/70 Patient Position (if appropriate): Lying Oxygen Therapy SpO2: 98 % O2 Device: Room Air Pain: Pain Assessment Pain Scale: 0-10 Pain Score: 0-No pain   Other Treatments:     Therapy/Group: Individual Therapy  Carlos Levering 07/22/2019, 3:35 PM

## 2019-07-22 NOTE — Progress Notes (Addendum)
Kyle Garcia PHYSICAL MEDICINE & REHABILITATION PROGRESS NOTE   Subjective/Complaints: Pt sitting up in bed. No new issues. Asked if I was Kyle Garcia.   ROS: Limited due to cognitive/behavioral        Objective:   No results found. Recent Labs    07/21/19 0701  WBC 9.6  HGB 8.9*  HCT 28.1*  PLT 230   Recent Labs    07/21/19 0701  NA 133*  K 3.8  CL 100  CO2 23  GLUCOSE 108*  BUN 18  CREATININE 0.62  CALCIUM 8.2*    Intake/Output Summary (Last 24 hours) at 07/22/2019 4665 Last data filed at 07/22/2019 0900 Gross per 24 hour  Intake 1210 ml  Output -  Net 1210 ml     Physical Exam: Vital Signs Blood pressure 134/75, pulse 99, temperature 98 F (36.7 C), temperature source Oral, resp. rate 16, height 5\' 11"  (1.803 m), weight 62.1 kg, SpO2 100 %.   Constitutional: No distress . Vital signs reviewed. HEENT: EOMI, oral membranes moist Neck: supple Cardiovascular: RRR without murmur. No JVD    Respiratory: CTA Bilaterally without wheezes or rales. Normal effort    GI: BS +, non-tender, non-distended  Extremities: No clubbing, cyanosis, or edema Skin: No evidence of breakdown, no evidence of rash Neurologic: more attentive and engaging. Still distracted with right gaze preference.    motor strength is 5/5 in Right and 0/5 left  deltoid, bicep, tricep, grip, hip flexor, knee extensors, ankle dorsiflexor and plantar flexor--neuro exam without sig change LLE can be stretched into neutral with minimal effort by examiner. DTR's 3+ LUE and LLE.  Sensory exam: decreased pain sense LUE and LLE  Musculoskeletal: no swelling Psych: more engaged  Assessment/Plan: 1. Functional deficits secondary to Right MCA infarct with dysphagia  which require 3+ hours per day of interdisciplinary therapy in a comprehensive inpatient rehab setting.  Physiatrist is providing close team supervision and 24 hour management of active medical problems listed below.  Physiatrist and rehab team  continue to assess barriers to discharge/monitor patient progress toward functional and medical goals  Care Tool:  Bathing    Body parts bathed by patient: Chest, Abdomen, Face, Right lower leg   Body parts bathed by helper: Chest, Abdomen, Face     Bathing assist Assist Level: Moderate Assistance - Patient 50 - 74%     Upper Body Dressing/Undressing Upper body dressing   What is the patient wearing?: Pull over shirt    Upper body assist Assist Level: Maximal Assistance - Patient 25 - 49%    Lower Body Dressing/Undressing Lower body dressing      What is the patient wearing?: Pants, Incontinence brief     Lower body assist Assist for lower body dressing: Dependent - Patient 0%     Toileting Toileting    Toileting assist Assist for toileting: Dependent - Patient 0%     Transfers Chair/bed transfer  Transfers assist     Chair/bed transfer assist level: 2 Helpers     Locomotion Ambulation   Ambulation assist   Ambulation activity did not occur: Safety/medical concerns  Assist level: 2 helpers Assistive device: (3 muskateers) Max distance: 8 ft   Walk 10 feet activity   Assist  Walk 10 feet activity did not occur: Safety/medical concerns  Assist level: 2 helpers Assistive device: (3 muskateers)   Walk 50 feet activity   Assist Walk 50 feet with 2 turns activity did not occur: Safety/medical concerns  Walk 150 feet activity   Assist Walk 150 feet activity did not occur: Safety/medical concerns         Walk 10 feet on uneven surface  activity   Assist Walk 10 feet on uneven surfaces activity did not occur: Safety/medical concerns         Wheelchair     Assist Will patient use wheelchair at discharge?: Yes Type of Wheelchair: Manual    Wheelchair assist level: Dependent - Patient 0%(TIS w/c) Max wheelchair distance: 150 ft    Wheelchair 50 feet with 2 turns activity    Assist        Assist Level:  Dependent - Patient 0%   Wheelchair 150 feet activity     Assist     Assist Level: Dependent - Patient 0%    Medical Problem List and Plan: 1.Dense lefthemiparesiswith dysphagiasecondary to embolic shower right MCA occlusion status post reperfusion. Infarct felt to be embolic secondary to hypercoagulable state from lung cancer -CIR PT, OT,  SLP   -PRAFO, WHO Left side  -significant visual-spatial deficits on left remain  -participation can fluctuate d/t neuro status/fatigue. Therapy has reported "pauses" or periods of decreased response---continue to observe. His exam is improved if anything over the last2-3 days although he remains quite imparied.  2. Antithrombotics: -DVT/anticoagulation/history of right lower extremity MOL:[id number] -antiplatelet therapy: N/A 3. Pain Management:Tylenol as needed  -antispasmodics   -continue low dose baclofen--increase to  5mg  TID---observe for sedation   -orthotics 4. Mood:Provide emotional support -antipsychotic agents: N/A  -HR improved, increase ritalin to 10mg  bid 5. Neuropsych: This patientiscapable of making decisions on hisown behalf. 6. Skin/Wound Care:Routine skin checks 7. Fluids/Electrolytes/Nutrition:   -continue IVF  -BUN/Cr holding--no change in plan---check tomorrow 8.Stage IV adenocarcinoma right lung. Chemotherapy as per [name]  -onc appts on temporary hold until after rehab discharge 9. Aspiration pneumonia.   Unasyn completed  . Afebrile, WBC 8.3  -aspiration precautions 10. Dysphagia. Dysphasia #2 honey thick liquids.   -advance diet per SLP  -IVF HS  -recheck labs  Thursday potentially 11. Hyperlipidemia. Crestor 12. Remote tobacco abuse. Counseling 13.Acute on chronic anemia. Hgb slightly up at 8.3 will cont to monitor   14 Hypocalcemia- improved  -likely related to #8 15. Tachycardia:  lopressor increased to 25mg  BID  -BP holding,   HR a little lower this morning in the 90's     LOS: 12 days A FACE TO FACE EVALUATION WAS PERFORMED  [name] 07/22/2019, 9:22 AM

## 2019-07-22 NOTE — Progress Notes (Addendum)
Occupational Therapy Session Note  Patient Details  Name: Kyle Garcia MRN: [id number] Date of Birth: [birth year]  Today's Date: 07/22/2019 OT Individual Time: 1415-1530 OT Individual Time Calculation (min): 75 min    Short Term Goals: Week 2:  OT Short Term Goal 1 (Week 2): Pt will locate 1 item at midline with MAX VC OT Short Term Goal 2 (Week 2): Pt will maintain static sitting balance for 10 seconds with MOD A and max cuing OT Short Term Goal 3 (Week 2): Pt will sustain attention to wash 1 body part wiht min VC  Skilled Therapeutic Interventions/Progress Updates:    Pt received supine in bed with no c/o pain. Pt required total A to transition to sitting EOB. Initially pt pushing toward L and requiring max A to maintain balance. With L UE placed in weightbearing position pt able to maintain balance with min A. Pt also able to maintain balance while leaning on R elbow (required total OT facilitation to reach this position). Pt initiated removing shirt EOB and required max A overall to doff. Pt unable to attend to task and follow commands to pick up washcloth or wash any body part. Total A for bathing. Pt returned to supine and peri hygiene completed. +2 retrieved and pt was again transferred to EOB. Using 3 musketeers method, pt completed 5 ft of functional mobility with max facilitation for L LE advancement and knee stabilization. Pt transferred onto small shower chair and with L UE propped on sink he was able to maintain sitting balance with back support from chair. Pt voided bowels and then stood for total A peri hygiene. Pt transferred into TIS w/c. Max facilitation and cueing required for pt to place head in a neutral position, however pt unable to hold for more than 1 second. Pt was taken to therapy gym briefly where focus was placed on visual tracking past midline, L UE PROM, and cervical ROM. Pt returned to room and left sitting up in TIS with all needs met, seat belt and chair alarm belt  set.   Addendum: Of note- pt hallucinating throughout session.   Therapy Documentation Precautions:  Precautions Precautions: Fall Precaution Comments: Dense Lt hemi, pusher Restrictions Weight Bearing Restrictions: No   Therapy/Group: Individual Therapy  [name] 07/22/2019, 5:07 PM

## 2019-07-22 NOTE — Progress Notes (Signed)
Social Work Patient ID: Kyle Garcia, male   DOB: [birth year], 68 y.o.   MRN: [id number]  Have reviewed team conference with pt's wife who is aware of est 3 more weeks and mod assist w/c level goals.  Wife and daughter in to observe therapies on Monday.  Wife reports, "We know it's going to be hard (pt care at home)... but we will figure it out."  She also notes that pt's son, "Is having a really hard time with it all" and asks if he might be allowed to visit as well.  Will need extensive education with caregivers prior to d/c.   Spoke with pt who does not remember wife and daughter being here.  Remains very impaired.  Continue to follow.  Kyle Wehner, LCSW

## 2019-07-22 NOTE — Progress Notes (Signed)
Speech Language Pathology Daily Session Note  Patient Details  Name: Amandeep Nesmith MRN: [id number] Date of Birth: 09/04/1951  Today's Date: 07/22/2019 SLP Individual Time: 1015-1045 SLP Individual Time Calculation (min): 30 min  Short Term Goals: Week 2: SLP Short Term Goal 1 (Week 2): Patient will consume current diet with minimal overt s/s of aspiration and Min verbal cues for use of swallowing compensatory strategies. SLP Short Term Goal 2 (Week 2): Patient will consume trials of nectar-thick liquids with minimal overt s/s of aspiration and Min A verbal cues for use of swallowing compensatory strategies. SLP Short Term Goal 3 (Week 2): Patient will demonstrate sustained attention to functional tasks for 5 minutes with Max A verbal cues for redirection. SLP Short Term Goal 4 (Week 2): Patient will initiate functional tasks with Mod A multimodal cues. SLP Short Term Goal 5 (Week 2): Patient will scan to midline to locate functional items in 5/10 opportunities.  Skilled Therapeutic Interventions: Skilled treatment session focused on cognitive goals. SLP facilitated session by providing Mod A verbal and tactile cues for patient to follow 1-step commands and initiate movement with RUE. Patient participated in a basic sorting task from a field of 2 with Max verbal and visual cues needed to scan to midline in 75% of opportunities. Patient required Max verbal cues for sustained attention to task for ~60 second intervals with intermittent language of confusion. Patient left upright in wheelchair with alarm on and all needs within reach. Continue with current plan of care.      Pain No/Denies Pain   Therapy/Group: Individual Therapy  Wilkes Potvin 07/22/2019, 3:01 PM

## 2019-07-22 NOTE — Progress Notes (Signed)
Physical Therapy Session Note  Patient Details  Name: Kyle Garcia MRN: [id number] Date of Birth: Nov 04, 1951  Today's Date: 07/22/2019 PT Individual Time: 0905-0959 PT Individual Time Calculation (min): 54 min   Short Term Goals: Week 2:  PT Short Term Goal 1 (Week 2): Pt will perform bed mobility with max assist +1. PT Short Term Goal 2 (Week 2): Pt will complete bed<>w/c with max assist +1. PT Short Term Goal 3 (Week 2): Pt will maintain static sitting balance with mod assist +1.  Skilled Therapeutic Interventions/Progress Updates:  Pt received in bed, more alert on this date & agreeable to tx. Therapist dons shorts, socks, & shoes & lite gait sling dependent assist. Pt requires +2 for rolling L<>R but pt with improving ability to participate in R sidelying>sitting upright by pushing with his RUE but once sitting has no awareness of midline & continues to push himself over. Pt transfers bed>w/c with slide board +2 dependent assist. While in lite gait pt transferred to standing and focused on upright posture, midline orientation, BLE strengthening & NMR, neutral head alignment & activity tolerance. Pt with little engagement in anterior weigt shifting & pushing to stand. Once standing pt continues to hang in harness despite therapist's attempts at anterior pelvic shift, upright chest. Pt pushes L with RLE and demonstrates little activation in LLE with therapist blocking L knee to prevent buckling and assistance with weight shifting R. Pt is able to step forwards/backwards with RLE to focus on weight bearing in LLE. Pt is able to ambulate up to 15 ft in lite gait with max assist and cuing for sequencing. Pt requires max assist for upright posture but is intermittently able to assist with holding chest up, but continues to require total assist for holding head up vs fully flexed. Pt continues to demonstrate impaired ability to track midline with eyes & is internally distracted on this date. At end of  session pt left in TIS w/c with lap tray & LUE hand splint donned, chair alarm donned & call bell in lap.   Therapy Documentation Precautions:  Precautions Precautions: Fall Precaution Comments: Dense Lt hemi, pusher Restrictions Weight Bearing Restrictions: No  Pain: Pt with less crying out in pain on this date but still reporting bed mobility is uncomfortable, does not state location of pain, nor rate it. Rest breaks & repositioning provided PRN.  Therapy/Group: Individual Therapy  [name] 07/22/2019, 12:19 PM

## 2019-07-22 NOTE — Discharge Instructions (Addendum)
Inpatient Rehab Discharge Instructions  Kyle Garcia Discharge date and time: No discharge date for patient encounter.   Activities/Precautions/ Functional Status: Activity: activity as tolerated Diet: Dysphagia 1 honey liquids Wound Care: none needed Functional status:  ___ No restrictions     ___ Walk up steps independently ___ 24/7 supervision/assistance   ___ Walk up steps with assistance ___ Intermittent supervision/assistance  ___ Bathe/dress independently ___ Walk with walker     _x__ Bathe/dress with assistance ___ Walk Independently    ___ Shower independently ___ Walk with assistance    ___ Shower with assistance ___ No alcohol     ___ Return to work/school ________     COMMUNITY REFERRALS UPON DISCHARGE:    Home Health:     RN                    Agency:  [facility] Phone: [phone]   Medical Equipment/Items Ordered:  Hospital bed and hoyer lift provided by [facility] @ [phone]                                                                  Wheelchair and cushion provided by [employer] @ [phone]       Special Instructions:  STROKE/TIA DISCHARGE INSTRUCTIONS SMOKING Cigarette smoking nearly doubles your risk of having a stroke & is the single most alterable risk factor  If you smoke or have smoked in the last 12 months, you are advised to quit smoking for your health.  Most of the excess cardiovascular risk related to smoking disappears within a year of stopping.  Ask you doctor about anti-smoking medications  [place] Quit Line: 1-800-QUIT NOW  Free Smoking Cessation Classes [phone]  CHOLESTEROL Know your levels; limit fat & cholesterol in your diet  Lipid Panel     Component Value Date/Time   CHOL 117 07/03/2019 0500   TRIG 93 07/03/2019 0500   HDL 29 (L) 07/03/2019 0500   CHOLHDL 4.0 07/03/2019 0500   VLDL 19 07/03/2019 0500   LDLCALC 69 07/03/2019 0500   LDLCALC 99 12/02/2018 0906      Many patients benefit from  treatment even if their cholesterol is at goal.  Goal: Total Cholesterol (CHOL) less than 160  Goal:  Triglycerides (TRIG) less than 150  Goal:  HDL greater than 40  Goal:  LDL (LDLCALC) less than 100   BLOOD PRESSURE American Stroke Association blood pressure target is less that 120/80 mm/Hg  Your discharge blood pressure is:  BP: 118/68  Monitor your blood pressure  Limit your salt and alcohol intake  Many individuals will require more than one medication for high blood pressure  DIABETES (A1c is a blood sugar average for last 3 months) Goal HGBA1c is under 7% (HBGA1c is blood sugar average for last 3 months)  Diabetes: No known diagnosis of diabetes    Lab Results  Component Value Date   HGBA1C 5.7 (H) 07/03/2019     Your HGBA1c can be lowered with medications, healthy diet, and exercise.  Check your blood sugar as directed by your physician  Call your physician if you experience unexplained or low blood sugars.  PHYSICAL ACTIVITY/REHABILITATION Goal is 30 minutes at least 4 days per week  Activity: Increase activity slowly, Therapies:  Physical Therapy: Home Health Return to work:   Activity decreases your risk of heart attack and stroke and makes your heart stronger.  It helps control your weight and blood pressure; helps you relax and can improve your mood.  Participate in a regular exercise program.  Talk with your doctor about the best form of exercise for you (dancing, walking, swimming, cycling).  DIET/WEIGHT Goal is to maintain a healthy weight  Your discharge diet is:  Diet Order            DIET DYS 2 Room service appropriate? Yes; Fluid consistency: Honey Thick  Diet effective now              liquids Your height is:    Your current weight is:   Your Body Mass Index (BMI) is:     Following the type of diet specifically designed for you will help prevent another stroke.  Your goal weight range is:    Your goal Body Mass Index (BMI) is  19-24.  Healthy food habits can help reduce 3 risk factors for stroke:  High cholesterol, hypertension, and excess weight.  RESOURCES Stroke/Support Group:  Call [phone]   STROKE EDUCATION PROVIDED/REVIEWED AND GIVEN TO PATIENT Stroke warning signs and symptoms How to activate emergency medical system (call 911). Medications prescribed at discharge. Need for follow-up after discharge. Personal risk factors for stroke. Pneumonia vaccine given:  Flu vaccine given:  My questions have been answered, the writing is legible, and I understand these instructions.  I will adhere to these goals & educational materials that have been provided to me after my discharge from the hospital.      My questions have been answered and I understand these instructions. I will adhere to these goals and the provided educational materials after my discharge from the hospital.  Patient/Caregiver Signature _______________________________ Date __________  Clinician Signature _______________________________________ Date __________  Please bring this form and your medication list with you to all your follow-up doctor's appointments.      Information on my medicine - ELIQUIS (apixaban)  Why was Eliquis prescribed for you? Eliquis was prescribed for you to reduce the risk of a blood clot forming that can cause a stroke if you have a medical condition called atrial fibrillation (a type of irregular heartbeat).  What do You need to know about Eliquis ? Take your Eliquis TWICE DAILY - one tablet in the morning and one tablet in the evening with or without food. If you have difficulty swallowing the tablet whole please discuss with your pharmacist how to take the medication safely.  Take Eliquis exactly as prescribed by your doctor and DO NOT stop taking Eliquis without talking to the doctor who prescribed the medication.  Stopping may increase your risk of developing a stroke.  Refill your prescription  before you run out.  After discharge, you should have regular check-up appointments with your healthcare provider that is prescribing your Eliquis.  In the future your dose may need to be changed if your kidney function or weight changes by a significant amount or as you get older.  What do you do if you miss a dose? If you miss a dose, take it as soon as you remember on the same day and resume taking twice daily.  Do not take more than one dose of ELIQUIS at the same time to make up a missed dose.  Important Safety Information A possible side effect of Eliquis is bleeding. You should call your healthcare  provider right away if you experience any of the following: ? Bleeding from an injury or your nose that does not stop. ? Unusual colored urine (red or dark brown) or unusual colored stools (red or black). ? Unusual bruising for unknown reasons. ? A serious fall or if you hit your head (even if there is no bleeding).  Some medicines may interact with Eliquis and might increase your risk of bleeding or clotting while on Eliquis. To help avoid this, consult your healthcare provider or pharmacist prior to using any new prescription or non-prescription medications, including herbals, vitamins, non-steroidal anti-inflammatory drugs (NSAIDs) and supplements.  This website has more information on Eliquis (apixaban): http://www.eliquis.com/eliquis/home

## 2019-07-23 ENCOUNTER — Inpatient Hospital Stay (HOSPITAL_COMMUNITY): Payer: Medicare Other | Admitting: Physical Therapy

## 2019-07-23 ENCOUNTER — Inpatient Hospital Stay (HOSPITAL_COMMUNITY): Payer: Medicare Other

## 2019-07-23 ENCOUNTER — Inpatient Hospital Stay (HOSPITAL_COMMUNITY): Payer: Medicare Other | Admitting: Speech Pathology

## 2019-07-23 ENCOUNTER — Inpatient Hospital Stay (HOSPITAL_COMMUNITY): Payer: Medicare Other | Admitting: Occupational Therapy

## 2019-07-23 LAB — BASIC METABOLIC PANEL
Anion gap: 8 (ref 5–15)
BUN: 21 mg/dL (ref 8–23)
CO2: 24 mmol/L (ref 22–32)
Calcium: 8.4 mg/dL — ABNORMAL LOW (ref 8.9–10.3)
Chloride: 103 mmol/L (ref 98–111)
Creatinine, Ser: 0.68 mg/dL (ref 0.61–1.24)
GFR calc Af Amer: >60 mL/min (ref >60)
GFR calc non Af Amer: >60 mL/min (ref >60)
Glucose, Bld: 110 mg/dL — ABNORMAL HIGH (ref 70–99)
Potassium: 3.7 mmol/L (ref 3.5–5.1)
Sodium: 135 mmol/L (ref 135–145)

## 2019-07-23 NOTE — Progress Notes (Signed)
Occupational Therapy Session Note  Patient Details  Name: Kyle Garcia MRN: [id number] Date of Birth: 05/28/1951  Today's Date: 07/23/2019 OT Individual Time: 1100-1200 OT Individual Time Calculation (min): 60 min    Short Term Goals: Week 1:  OT Short Term Goal 1 (Week 1): Pt will complete 1 grooming task EOB with 1 assist for sitting balance OT Short Term Goal 1 - Progress (Week 1): Progressing toward goal OT Short Term Goal 2 (Week 1): Pt will initiate washing affected arm with max vcs OT Short Term Goal 2 - Progress (Week 1): Progressing toward goal OT Short Term Goal 3 (Week 1): Pt will exhibit improved sustained attention by initiating placing 1 limb into UB garment OT Short Term Goal 3 - Progress (Week 1): Not met  Skilled Therapeutic Interventions/Progress Updates:    1:1. Pt received in bed. OT clinical specialist present as +2 and pt agreeable to get in shower. Pt demo increased clonus in LLE and flexor tone in LUE this date and pt lying in bed without resting hand splint on upon arrival. Pt supine>sitting EOB with total A of 2. Squat pivot transfers EOB>roll in shower chair>standard chair>TIS with total A of 2 with manual facilitation of hand placement in lap or around OT. Increased time provided for weight shift forward. Pt bathes chest abdomen and LUE and RLE with MAX VC and min HOH A to initate washing. Pt with total LOB 1x in shower chair forward and L with total A to prevent fall. Pt dresses total A with no awareness of hemi dressing or attention to task. Pt sit to stand with MAX A and +2 advances pants past hips. Exited session with pt seated in TIS, Lap tray in place, resting hand splint in place, tilted back, exit alarm on with call light in reach and all needs met  Therapy Documentation Precautions:  Precautions Precautions: Fall Precaution Comments: Dense Lt hemi, pusher Restrictions Weight Bearing Restrictions: No General:   Vital Signs:  Pain:    ADL: ADL Eating: Not assessed Grooming: Dependent(washing hands) Where Assessed-Grooming: Bed level Upper Body Bathing: Dependent Where Assessed-Upper Body Bathing: Bed level Lower Body Bathing: Dependent Where Assessed-Lower Body Bathing: Bed level Upper Body Dressing: Dependent Where Assessed-Upper Body Dressing: Bed level Lower Body Dressing: Dependent Where Assessed-Lower Body Dressing: Bed level Toileting: Dependent Where Assessed-Toileting: Bed level Toilet Transfer: Not assessed Tub/Shower Transfer: Not assessed Vision   Perception    Praxis   Exercises:   Other Treatments:     Therapy/Group: Individual Therapy  [name] 07/23/2019, 12:06 PM

## 2019-07-23 NOTE — Progress Notes (Signed)
Speech Language Pathology Daily Session Note  Patient Details  Name: Kyle Garcia MRN: [id number] Date of Birth: [birth year]  Today's Date: 07/23/2019 SLP Individual Time: 1030-1100 SLP Individual Time Calculation (min): 30 min  Short Term Goals: Week 2: SLP Short Term Goal 1 (Week 2): Patient will consume current diet with minimal overt s/s of aspiration and Min verbal cues for use of swallowing compensatory strategies. SLP Short Term Goal 2 (Week 2): Patient will consume trials of nectar-thick liquids with minimal overt s/s of aspiration and Min A verbal cues for use of swallowing compensatory strategies. SLP Short Term Goal 3 (Week 2): Patient will demonstrate sustained attention to functional tasks for 5 minutes with Max A verbal cues for redirection. SLP Short Term Goal 4 (Week 2): Patient will initiate functional tasks with Mod A multimodal cues. SLP Short Term Goal 5 (Week 2): Patient will scan to midline to locate functional items in 5/10 opportunities.  Skilled Therapeutic Interventions:  Skilled treatment session focused on dysphagia and cognition goals. SLP facilitated session by providing skilled observation of pt consuming nectar thick liquids via cup sips with SLP providing hand over hand support to cup. Pt with appearance of timely swallow but after consuming ~ 3 oz pt produced cough. After initial cough, pt appeared unable to cease coughing and coughed for extended period of time. Prior to consumption of nectar thick liquids, pt appeared to suck down post-nasal drip. It is difficult to attribute coughing to aspiration at the bedside during this session. SLP further facilitated session by providing Max A verbal for redirection to task at 3 minute intervals. Pt left upright in bed, bed alarm on and all needs within reach. Continue per current plan of care.      Pain    Therapy/Group: Individual Therapy  Kirstie Larsen 07/23/2019, 2:19 PM

## 2019-07-23 NOTE — Progress Notes (Signed)
[facility] PHYSICAL MEDICINE & REHABILITATION PROGRESS NOTE   Subjective/Complaints: Lying in bed. No new complaints.   ROS: Limited due to cognitive/behavioral       Objective:   No results found. Recent Labs    07/21/19 0701  WBC 9.6  HGB 8.9*  HCT 28.1*  PLT 230   Recent Labs    07/21/19 0701 07/23/19 0520  NA 133* 135  K 3.8 3.7  CL 100 103  CO2 23 24  GLUCOSE 108* 110*  BUN 18 21  CREATININE 0.62 0.68  CALCIUM 8.2* 8.4*    Intake/Output Summary (Last 24 hours) at 07/23/2019 1012 Last data filed at 07/22/2019 1900 Gross per 24 hour  Intake 200 ml  Output -  Net 200 ml     Physical Exam: Vital Signs Blood pressure 109/73, pulse (!) 119, temperature 98.3 F (36.8 C), resp. rate 18, height 5\' 11"  (1.803 m), weight 62.1 kg, SpO2 96 %.   Constitutional: No distress . Vital signs reviewed. HEENT: EOMI, oral membranes moist Neck: supple Cardiovascular: RRR without murmur. No JVD    Respiratory: CTA Bilaterally without wheezes or rales. Normal effort    GI: BS +, non-tender, non-distended  Extremities: No clubbing, cyanosis, or edema Skin: No evidence of breakdown, no evidence of rash Neurologic: more attentive and engaging. Still distracted with right gaze preference.    motor strength is 5/5 in Right and 0/5 left  deltoid, bicep, tricep, grip, hip flexor, knee extensors, ankle dorsiflexor and plantar flexor--neuro exam without sig change LLE figure 4.  DTR's 3+ LUE and LLE.  Sensory exam: decreased pain sense LUE and LLE  Musculoskeletal: no swelling Psych: engages when cued  Assessment/Plan: 1. Functional deficits secondary to Right MCA infarct with dysphagia  which require 3+ hours per day of interdisciplinary therapy in a comprehensive inpatient rehab setting.  Physiatrist is providing close team supervision and 24 hour management of active medical problems listed below.  Physiatrist and rehab team continue to assess barriers to discharge/monitor  patient progress toward functional and medical goals  Care Tool:  Bathing    Body parts bathed by patient: Face   Body parts bathed by helper: Chest, Abdomen, Face     Bathing assist Assist Level: Dependent - Patient 0%     Upper Body Dressing/Undressing Upper body dressing   What is the patient wearing?: Pull over shirt    Upper body assist Assist Level: Total Assistance - Patient < 25%    Lower Body Dressing/Undressing Lower body dressing      What is the patient wearing?: Pants, Incontinence brief     Lower body assist Assist for lower body dressing: Dependent - Patient 0%     Toileting Toileting    Toileting assist Assist for toileting: Dependent - Patient 0%     Transfers Chair/bed transfer  Transfers assist     Chair/bed transfer assist level: 2 Helpers     Locomotion Ambulation   Ambulation assist   Ambulation activity did not occur: Safety/medical concerns  Assist level: 2 helpers Assistive device: Lite Gait Max distance: 15 ft   Walk 10 feet activity   Assist  Walk 10 feet activity did not occur: Safety/medical concerns  Assist level: 2 helpers Assistive device: Lite Gait   Walk 50 feet activity   Assist Walk 50 feet with 2 turns activity did not occur: Safety/medical concerns         Walk 150 feet activity   Assist Walk 150 feet activity did not  occur: Safety/medical concerns         Walk 10 feet on uneven surface  activity   Assist Walk 10 feet on uneven surfaces activity did not occur: Safety/medical concerns         Wheelchair     Assist Will patient use wheelchair at discharge?: Yes Type of Wheelchair: Manual    Wheelchair assist level: Dependent - Patient 0%(TIS w/c) Max wheelchair distance: 150 ft    Wheelchair 50 feet with 2 turns activity    Assist        Assist Level: Dependent - Patient 0%   Wheelchair 150 feet activity     Assist     Assist Level: Dependent - Patient 0%     Medical Problem List and Plan: 1.Dense lefthemiparesiswith dysphagiasecondary to embolic shower right MCA occlusion status post reperfusion. Infarct felt to be embolic secondary to hypercoagulable state from lung cancer -CIR PT, OT,  SLP   -PRAFO, WHO Left side  -significant visual-spatial deficits on left remain 2. Antithrombotics: -DVT/anticoagulation/history of right lower extremity SNK:[id number] -antiplatelet therapy: N/A 3. Pain Management:Tylenol as needed  -antispasmodics   -continue low dose baclofen  5mg  TID---observe for sedation   -orthotics 4. Mood:Provide emotional support -antipsychotic agents: N/A  -HR improved, increase ritalin to 10mg  bid 5. Neuropsych: This patientiscapable of making decisions on hisown behalf. 6. Skin/Wound Care:Routine skin checks 7. Fluids/Electrolytes/Nutrition:   -continue IVF  -BUN/Cr holding--I personally reviewed the patient's labs today.   8.Stage IV adenocarcinoma right lung. Chemotherapy as per [name]  -onc appts on temporary hold until after rehab discharge 9. Aspiration pneumonia.   Unasyn completed  . Afebrile, WBC 8.3  -aspiration precautions 10. Dysphagia. Dysphasia #2 honey thick liquids. ---intake actually quite reasonable  -advance diet per SLP  -IVF HS until off [place]  -labs stable as above 11. Hyperlipidemia. Crestor 12. Remote tobacco abuse. Counseling 13.Acute on chronic anemia. Hgb slightly up at 8.3 will cont to monitor   14 Hypocalcemia-stable at 8.4  -likely related to #8 15. Tachycardia:  lopressor increased to 25mg  BID  -BP holding,  HR remains elevated  -consider further titration of lopressor but unsure if BP will allow     LOS: 13 days A FACE TO FACE EVALUATION WAS PERFORMED  [name] 07/23/2019, 10:12 AM

## 2019-07-23 NOTE — Progress Notes (Signed)
Physical Therapy Session Note  Patient Details  Name: Kyle Garcia MRN: [id number] Date of Birth: 02/28/1951  Today's Date: 07/23/2019 PT Individual Time: 2458-0998 PT Individual Time Calculation (min): 45 min   Short Term Goals: Week 2:  PT Short Term Goal 1 (Week 2): Pt will perform bed mobility with max assist +1. PT Short Term Goal 2 (Week 2): Pt will complete bed<>w/c with max assist +1. PT Short Term Goal 3 (Week 2): Pt will maintain static sitting balance with mod assist +1.  Skilled Therapeutic Interventions/Progress Updates:   Pt received sitting in TIS w/c and agreeable to therapy session. Transported to/from gym in w/c. Pt with hyperverbosity, tangential, and poor attention to task requiring frequent max cuing to reorient pt and attend to task. Slide board transfer TIS>EOM with +2 total assist as pt unable to assist with transfer despite max assist and increased time for pt participation.  Sitting EOM pt demonstrates varying from R UE pushing causing L LOB with max assist to maintain upright to pt able to maintain midline sitting with R UE support and CGA/close supervision - frequently had pt return to R forearm support when he started pushing in order to reset midline awareness - pt unable to correct or initiate correcting L LOB despite increased time and max cuing - continues to demonstrate forward neck flexion with minimal attempts at increased neck extension to look forward despite max cuing and manual facilitation.   Sit<>stands x2 EOM<>3 [place] support with mod +2 assist for lifting into standing - pt continues to demonstrate forward trunk and neck flexion in standing requiring manual facilitation for improvement. Tolerated standing ~1-2 minutes each bout with +2 mod assist - cuing for improved hip extension. Attempted to progress to pre-gait training of stepping R LE forward/backwards with max multimodal cuing and manual facilitation but pt unable to sequence to perform task  at this time despite +2 max assist.   Sit>supine with +2 mod assist for trunk descent and B LE management. Due to continued forward flexed trunk/neck posture with rounding shoulders performed supine stretch with towel roll along his spine to increase scapular retraction and spine extension. Pt demonstrates ability to achieve an extended trunk/neck posture during stretch therefore determine head posture in sitting is due to a combination of lack of awareness/attention and impaired strength. Sit>supine with max assist for trunk upright and max multimodal cuing for sequencing - pt demonstrates ability to participate in task with R HB.  Slide board transfer EOM>w/c with +2 total assist for board placement and scooting as pt again demonstrates poor attention to task limiting participation. Transported back to room and pt left sitting in TIS w/c, tilted back, seat belt alarm on, and needs in reach.   Therapy Documentation Precautions:  Precautions Precautions: Fall Precaution Comments: Dense Lt hemi, pusher Restrictions Weight Bearing Restrictions: No  Pain: Reports pain in back while sitting on EOM - provided rest break and repositioning for pain management.   Therapy/Group: Individual Therapy  Tawana Scale, PT, DPT 07/23/2019, 6:03 PM

## 2019-07-23 NOTE — Progress Notes (Signed)
Physical Therapy Session Note  Patient Details  Name: Kyle Garcia MRN: [id number] Date of Birth: Apr 12, 1951  Today's Date: 07/23/2019 PT Individual Time: 0805-0905 PT Individual Time Calculation (min): 60 min   Short Term Goals:  Week 2:  PT Short Term Goal 1 (Week 2): Pt will perform bed mobility with max assist +1. PT Short Term Goal 2 (Week 2): Pt will complete bed<>w/c with max assist +1. PT Short Term Goal 3 (Week 2): Pt will maintain static sitting balance with mod assist +1.      Skilled Therapeutic Interventions/Progress Updates:  Terri, NT changing pt due to incontinence; bed linens wet.  Pt rolled L><R +2 for hygiene and changing bed.  neuromuscular re-education via multimodal cues for LLE gentle ROM knee and hip.  Pt stated that hip rotation "felt pretty good". Supine> sit to R +2.  Sitting balance training for midline orientation and reduction of pushing tendencies with RUE.  Pt eventually sat with bil hands in lap, midline orientation x 20 seconds.    Slide board transfer to R to w/c, slightly downhill, +2.  Pt intiated wt shift with count "1-2-3" .Transfer as above to firm mat.  Seated, reaching (R and forward) activity with R hand to promote trunk extension and visual tracking , retrieving 7 brightly colored items  and placing next to his R hip.  Sit>< stand +2 with pt's UEs draped over therapists' shoulders.  Gait training x 10' +2 without AD, with assistance for advancing LLE but some wt shifting initiated by pt.  At end of session, pt tilted back in w/c, with bil hands on lap tray, call bell on tray and seat belt alarm set.      Therapy Documentation Precautions:  Precautions Precautions: Fall Precaution Comments: Dense Lt hemi, pusher Restrictions Weight Bearing Restrictions: No  Pain: pt denies        Therapy/Group: Individual Therapy  [name] 07/23/2019, 10:26 AM

## 2019-07-24 ENCOUNTER — Inpatient Hospital Stay (HOSPITAL_COMMUNITY): Payer: Medicare Other

## 2019-07-24 ENCOUNTER — Inpatient Hospital Stay (HOSPITAL_COMMUNITY): Payer: Medicare Other | Admitting: Speech Pathology

## 2019-07-24 ENCOUNTER — Inpatient Hospital Stay (HOSPITAL_COMMUNITY): Payer: Medicare Other | Admitting: Physical Therapy

## 2019-07-24 MED ORDER — FOLIC ACID 1 MG PO TABS
1.0000 mg | ORAL_TABLET | Freq: Every day | ORAL | Status: DC
  Administered 2019-07-25 – 2019-08-14 (×21): 1 mg via ORAL
  Filled 2019-07-24 (×21): qty 1

## 2019-07-24 MED ORDER — ADULT MULTIVITAMIN W/MINERALS CH
1.0000 | ORAL_TABLET | Freq: Every day | ORAL | Status: DC
  Administered 2019-07-25 – 2019-08-14 (×21): 1 via ORAL
  Filled 2019-07-24 (×21): qty 1

## 2019-07-24 NOTE — Progress Notes (Signed)
Speech Language Pathology Weekly Progress and Session Note  Patient Details  Name: Kyle Garcia MRN: [id number] Date of Birth: 08/22/1951  Beginning of progress report period: July 17, 2019 End of progress report period: July 24, 2019  Today's Date: 07/24/2019 SLP Individual Time: 0258-5277 SLP Individual Time Calculation (min): 45 min  Short Term Goals: Week 2: SLP Short Term Goal 1 (Week 2): Patient will consume current diet with minimal overt s/s of aspiration and Min verbal cues for use of swallowing compensatory strategies. SLP Short Term Goal 1 - Progress (Week 2): Met SLP Short Term Goal 2 (Week 2): Patient will consume trials of nectar-thick liquids with minimal overt s/s of aspiration and Min A verbal cues for use of swallowing compensatory strategies  SLP Short Term Goal 2 - Progress (Week 2): Met SLP Short Term Goal 3 (Week 2): Patient will demonstrate sustained attention to functional tasks for 5 minutes with Max A verbal cues for redirection. SLP Short Term Goal 3 - Progress (Week 2): Not met SLP Short Term Goal 4 (Week 2): Patient will initiate functional tasks with Mod A multimodal cues. SLP Short Term Goal 4 - Progress (Week 2): Not met SLP Short Term Goal 5 (Week 2): Patient will scan to midline to locate functional items in 5/10 opportunities. SLP Short Term Goal 5 - Progress (Week 2): Not met(Goal met given Max A)    New Short Term Goals: Week 3: SLP Short Term Goal 1 (Week 3): Patient will consume dysphagia 2 diet with nectar thick liquids via spoon to achieve minimal overt s/s of aspiration and Min verbal cues for use of swallowing compensatory strategies. SLP Short Term Goal 2 (Week 3): Pt will consume nectar thick liquids via cup with minimal overt s/s of aspiration across 3 sessions to demonstrate readiness for consumption via cup. SLP Short Term Goal 3 (Week 3): Patient will demonstrate sustained attention to functional tasks for 5 minutes with Max A verbal cues  for redirection. SLP Short Term Goal 4 (Week 3): Given Mod A cues, pt will follow 1 step basic directions in 5 out of 10 opportunities. SLP Short Term Goal 5 (Week 3): Patient will initiate functional tasks in 5 out of 10 opportunities with Mod A multimodal cues SLP Short Term Goal 6 (Week 3): Patient will scan to midline to locate functional items in 5/10 opportunities with Mod A cues.  Weekly Progress Updates: Pt has made slow progress this reporting period d/t vast fluctuations in cognition. Pt continues to require Max A cues for basic cognition. Additionally, pt is consuming dysphagia 2 with nectar thick liquids via spoon. Given severity of deficits pt continues to require skilled ST to increase functional independence and reduce caregiver burden.      Intensity: Minumum of 1-2 x/day, 30 to 90 minutes Frequency: 3 to 5 out of 7 days Duration/Length of Stay: 3 weeks Treatment/Interventions: Cognitive remediation/compensation;Dysphagia/aspiration precaution training;Internal/external aids;Speech/Language facilitation;Therapeutic Activities;Environmental controls;Cueing hierarchy;Functional tasks;Patient/family education   Daily Session  Skilled Therapeutic Interventions:   Skilled treatment session focused on dysphagia and cognition goals. SLP faciltiated session by providing skilled observation of pt consuming nectar thick liquids via spoon. Pt consumed 4 oz with no overt s/s of aspiration. Of note, when SLP entered room, pt's vocal quality appears strained d/t cognitive deficits. Pt with intermittent dry cough at baseline, question if related to atypical vocal quality. After consumption of 4 oz nectar thick liquids, SLP further facilitated session by providing skilled observation of pt consuming dysphagia 2 breakfast with  nectar thick liquids via spoon. Pt with no overt s/s of aspiration and nop anterior spillage of bolus.  After reviewing pt's MBS and today's skilled observation (as well as  consulting pt's primary SLP), would recommend upgrade to nectar thick liquids via spoon to promote optimal swallow function. Pt does present with hypersensitive cough even when not consuming PO intake, so clinically, cough might not be associated with aspiration. Pt continues to demonstrate severe cognitive impairments that are c/b calling out to people who are not present in room, constant cues to cease talking with food in mouth and continued redirection/orientation to task at hand. Pt frequently called this writer "Fritz Pickerel" and was not responsive to correction. Information posted in pt's room on upgrade. Pt left upright in bed, bed alarm on and all needs within reach. Continue per current plan of care.   General    Pain    Therapy/Group: Individual Therapy  Kyle Garcia 07/24/2019, 9:03 AM

## 2019-07-24 NOTE — Plan of Care (Signed)
POC adjusted, goals downgraded as noted below:  Problem: RH Balance Goal: LTG Patient will maintain dynamic sitting balance (PT) Description: LTG:  Patient will maintain dynamic sitting balance with assistance during mobility activities (PT) Flowsheets (Taken 07/24/2019 1216) LTG: Pt will maintain dynamic sitting balance during mobility activities with:: (downgrade 2/2 slow progress) Minimal Assistance - Patient > 75% Note: downgrade 2/2 slow progress   Problem: Sit to Stand Goal: LTG:  Patient will perform sit to stand with assistance level (PT) Description: LTG:  Patient will perform sit to stand with assistance level (PT) Flowsheets (Taken 07/24/2019 1216) LTG: PT will perform sit to stand in preparation for functional mobility with assistance level: (downgrade 2/2 slow progress) Maximal Assistance - Patient 25 - 49% Note: downgrade 2/2 slow progress   Problem: RH Bed Mobility Goal: LTG Patient will perform bed mobility with assist (PT) Description: LTG: Patient will perform bed mobility with assistance, with/without cues (PT). Flowsheets (Taken 07/24/2019 1216) LTG: Pt will perform bed mobility with assistance level of: (downgrade 2/2 slow progress) Moderate Assistance - Patient 50 - 74% Note: downgrade 2/2 slow progress   Problem: RH Car Transfers Goal: LTG Patient will perform car transfers with assist (PT) Description: LTG: Patient will perform car transfers with assistance (PT). Outcome: Not Applicable Flowsheets (Taken 07/24/2019 1216) LTG: Pt will perform car transfers with assist:: (d/c goal - not appropriate at this time) --   Problem: RH Wheelchair Mobility Goal: LTG Patient will propel w/c in controlled environment (PT) Description: LTG: Patient will propel wheelchair in controlled environment, # of feet with assist (PT) Flowsheets (Taken 07/24/2019 1216) LTG: Pt will propel w/c in controlled environ  assist needed:: (downgrade 2/2 slow progress) Moderate Assistance -  Patient 50 - 74% LTG: Propel w/c distance in controlled environment: 25 ft Note: downgrade 2/2 slow progress Goal: LTG Patient will propel w/c in home environment (PT) Description: LTG: Patient will propel wheelchair in home environment, # of feet with assistance (PT). Outcome: Not Applicable Flowsheets (Taken 07/24/2019 1216) LTG: Pt will propel w/c in home environ  assist needed:: (d/c goal not approprate at this time) -- Note: D/c goal - not appropriate at this time

## 2019-07-24 NOTE — Progress Notes (Signed)
PHYSICAL MEDICINE & REHABILITATION PROGRESS NOTE   Subjective/Complaints: No new issues. Low back pain ongoing (says it's like this at home)  ROS: Limited due to cognitive/behavioral      Objective:   No results found. No results for input(s): WBC, HGB, HCT, PLT in the last 72 hours. Recent Labs    07/23/19 0520  NA 135  K 3.7  CL 103  CO2 24  GLUCOSE 110*  BUN 21  CREATININE 0.68  CALCIUM 8.4*    Intake/Output Summary (Last 24 hours) at 07/24/2019 1045 Last data filed at 07/24/2019 0630 Gross per 24 hour  Intake 1574 ml  Output -  Net 1574 ml     Physical Exam: Vital Signs Blood pressure 113/61, pulse (!) 115, temperature 99.4 F (37.4 C), temperature source Oral, resp. rate 18, height 5\' 11"  (1.803 m), weight 62.1 kg, SpO2 100 %.   Constitutional: No distress . Vital signs reviewed. HEENT: EOMI, oral membranes moist Neck: supple Cardiovascular: RRR without murmur. No JVD    Respiratory: CTA Bilaterally without wheezes or rales. Normal effort    GI: BS +, non-tender, non-distended  Extremities: No clubbing, cyanosis, or edema Skin: No evidence of breakdown, no evidence of rash Neurologic: more attentive and engaging. Still distracted with right gaze preference.    motor strength is 5/5 in Right and 0/5 left  deltoid, bicep, tricep, grip, hip flexor, knee extensors, ankle dorsiflexor and plantar flexor--2/4 tone flexors LUE, hamstrings, gastrocs on left LLE figure 4.  DTR's 3+ LUE and LLE.  Sensory exam: decreased pain sense LUE and LLE  Musculoskeletal: no swelling Psych: alert, cooperative  Assessment/Plan: 1. Functional deficits secondary to Right MCA infarct with dysphagia  which require 3+ hours per day of interdisciplinary therapy in a comprehensive inpatient rehab setting.  Physiatrist is providing close team supervision and 24 hour management of active medical problems listed below.  Physiatrist and rehab team continue to assess barriers to  discharge/monitor patient progress toward functional and medical goals  Care Tool:  Bathing    Body parts bathed by patient: Face   Body parts bathed by helper: Chest, Abdomen, Face     Bathing assist Assist Level: Dependent - Patient 0%     Upper Body Dressing/Undressing Upper body dressing   What is the patient wearing?: Pull over shirt    Upper body assist Assist Level: Total Assistance - Patient < 25%    Lower Body Dressing/Undressing Lower body dressing      What is the patient wearing?: Pants, Incontinence brief     Lower body assist Assist for lower body dressing: Dependent - Patient 0%     Toileting Toileting    Toileting assist Assist for toileting: Dependent - Patient 0%     Transfers Chair/bed transfer  Transfers assist     Chair/bed transfer assist level: 2 Helpers     Locomotion Ambulation   Ambulation assist   Ambulation activity did not occur: Safety/medical concerns  Assist level: 2 helpers Assistive device: No Device Max distance: 10   Walk 10 feet activity   Assist  Walk 10 feet activity did not occur: Safety/medical concerns  Assist level: 2 helpers Assistive device: No Device   Walk 50 feet activity   Assist Walk 50 feet with 2 turns activity did not occur: Safety/medical concerns         Walk 150 feet activity   Assist Walk 150 feet activity did not occur: Safety/medical concerns  Walk 10 feet on uneven surface  activity   Assist Walk 10 feet on uneven surfaces activity did not occur: Safety/medical concerns         Wheelchair     Assist Will patient use wheelchair at discharge?: Yes Type of Wheelchair: Manual    Wheelchair assist level: Dependent - Patient 0%(TIS w/c) Max wheelchair distance: 150 ft    Wheelchair 50 feet with 2 turns activity    Assist        Assist Level: Dependent - Patient 0%   Wheelchair 150 feet activity     Assist     Assist Level: Dependent  - Patient 0%    Medical Problem List and Plan: 1.Dense lefthemiparesiswith dysphagiasecondary to embolic shower right MCA occlusion status post reperfusion. Infarct felt to be embolic secondary to hypercoagulable state from lung cancer -CIR PT, OT,  SLP   -PRAFO, WHO Left side  -significant visual-spatial deficits on left remain 2. Antithrombotics: -DVT/anticoagulation/history of right lower extremity OAC:[id number] -antiplatelet therapy: N/A 3. Pain Management/spastic left hemiparesis:Tylenol as needed  -antispasmodics   -continue low dose baclofen  5mg  TID--seems to be tolerating.    -increase to QID scheduling   -titrate as far as we can go given tone   -orthotics 4. Mood:Provide emotional support -antipsychotic agents: N/A  -HR improved, increase ritalin to 10mg  bid 5. Neuropsych: This patientiscapable of making decisions on hisown behalf. 6. Skin/Wound Care:Routine skin checks 7. Fluids/Electrolytes/Nutrition:   -continue IVF through the weekend. Dc Monday   -BUN/Cr have held stable 8.Stage IV adenocarcinoma right lung. Chemotherapy as per [name]  -onc appts on temporary hold until after rehab discharge 9. Aspiration pneumonia.   Unasyn completed  . Afebrile, WBC 8.3  -aspiration precautions 10. Dysphagia. Dysphasia #2 honey thick liquids. ---intake actually quite reasonable  -advanced to nectars per SLP  -IVF HS this weekend, then dc Monday night  -labs stable as above 11. Hyperlipidemia. Crestor 12. Remote tobacco abuse. Counseling 13.Acute on chronic anemia. Hgb slightly up at 8.3 will cont to monitor   14 Hypocalcemia-stable at 8.4  -likely related to #8 15. Tachycardia:  lopressor increased to 25mg  BID  -BP holding,  HR remains elevated  -consider further titration of lopressor but unsure if BP will allow     LOS: 14 days A FACE TO [place] 07/24/2019, 10:45 AM

## 2019-07-24 NOTE — Progress Notes (Addendum)
Physical Therapy Weekly Progress Note  Patient Details  Name: Kyle Garcia MRN: [id number] Date of Birth: 11-26-1951  Beginning of progress report period: July 17, 2019 End of progress report period: July 24, 2019  Today's Date: 07/24/2019 PT Individual Time: 1102-1156 PT Individual Time Calculation (min): 54 min   Patient has met 0 of 3 short term goals.  Pt is making very slow progress towards LTG's. Treatment has focused on bed mobility, transfers, midline orientation, ROM to neck & extremities, posture and postural control, sitting balance, NMR through weight bearing in standing & gait with lite gait and cognitive remediation. Pt continues to require +2 assist for all mobility as pt is very limited by impaired cognition (awareness, attention to task, memory, recall) and has significant visual-spatial deficits. Pt's family has been in and observed a PT session with therapist educating them on pt's CLOF and anticipated DME needs upon d/c, as well as long recovery process. Pt would benefit from continued skilled PT treatment to focus on deficits noted above and to reduce caregiver burden.  Patient continues to demonstrate the following deficits muscle weakness and muscle joint tightness, decreased cardiorespiratoy endurance, decreased coordination and decreased motor planning, decreased visual acuity and decreased visual perceptual skills, decreased midline orientation, decreased attention to left, left side neglect and decreased motor planning, decreased initiation, decreased attention, decreased awareness, decreased problem solving, decreased safety awareness, decreased memory and delayed processing, central origin and decreased sitting balance, decreased standing balance, decreased postural control and decreased balance strategies and therefore will continue to benefit from skilled PT intervention to increase functional independence with mobility.  Patient not progressing toward long term goals.   See goal revision..  Plan of care revisions: sitting balance downgraded to min assist, sit<>stand downgraded to max assist, car transfer discharged 2/2 not appropriate at this time, w/c mobility in controlled environment with mod assist, w/c mobility in home discharged.  PT Short Term Goals Week 2:  PT Short Term Goal 1 (Week 2): Pt will perform bed mobility with max assist +1. PT Short Term Goal 1 - Progress (Week 2): Not met PT Short Term Goal 2 (Week 2): Pt will complete bed<>w/c with max assist +1. PT Short Term Goal 2 - Progress (Week 2): Not met PT Short Term Goal 3 (Week 2): Pt will maintain static sitting balance with mod assist +1. PT Short Term Goal 3 - Progress (Week 2): Progressing toward goal Week 3:  PT Short Term Goal 1 (Week 3): Pt will perform bed mobility with max assist +1. PT Short Term Goal 2 (Week 3): Pt will complete bed<>w/c with max assist +1. PT Short Term Goal 3 (Week 3): Pt will consistently demonstrate static sitting balance with mod assist +1.  Skilled Therapeutic Interventions/Progress Updates:  Pt received in w/c & agreeable to tx. Transported pt to/from dayroom via w/c dependent assist. Donned lite gait sling pt stands>ambulates in lite gait with +2 assist with focus on B knee extension, attempts for pt to actively weight bear through BLE, upright posture (anterior pelvic shift, upright chest, upright head with pt requiring total assist from therapist/lite gait features), weight shifting L<>R and stepping with pt able to actively advance RLE but requires assistance to advance and block LLE to prevent buckling. Pt with poor ability to hold head upright and attempt to correct posture. Pt with slightly less pushing with R side on this date with therapist placing RUE around therapist's waist. Pt continues to require max cuing to focus eyes on therapist &  attempt to track midline. Pt with poor attention to task but oriented to location, situation on this date. Back in room  therapist performed PROM to L elbow for extension and provided pt with nectar thick liquid via teaspoon with pt coughing and reporting he feels like he's choking - RN & SLP made aware. Pt left in TIS w/c with chair alarm & lap trap donned, call bell in lap.  Addendum: pt utilized cybex kinetron from w/c level for BLE strengthening & NMR with therapist providing total assist to facilitate movement with pt demonstrating poor sustained attention to task and unable to continue movements in reciprocal fashion without assistance from therapist.   Therapy Documentation Precautions:  Precautions Precautions: Fall Precaution Comments: Dense Lt hemi, pusher Restrictions Weight Bearing Restrictions: No    Pain: Pt with c/o pain in neck & "all over" - unrated - rest breaks & repositioning provided during session.  Therapy/Group: Individual Therapy  [name] 07/24/2019, 3:07 PM

## 2019-07-24 NOTE — Progress Notes (Signed)
Occupational Therapy Session Note  Patient Details  Name: Kyle Garcia MRN: [id number] Date of Birth: 03/14/1951  Today's Date: 07/24/2019 OT Individual Time: 0845-1000 OT Individual Time Calculation (min): 75 min   Session 2: OT Individual Time: 4818-5631 OT Individual Time Calculation (min): 30 min   Short Term Goals: Week 2:  OT Short Term Goal 1 (Week 2): Pt will locate 1 item at midline with MAX VC OT Short Term Goal 2 (Week 2): Pt will maintain static sitting balance for 10 seconds with MOD A and max cuing OT Short Term Goal 3 (Week 2): Pt will sustain attention to wash 1 body part wiht min VC  Skilled Therapeutic Interventions/Progress Updates:    Pt received supine with no c/o pain. Pt had urinary incontinence in his brief and +2 total A required to change and complete peri hygiene. Pt completed supine to sit EOB transfer with total +2 assistance. Pt completed stand pivot transfer to w/c with total +2. In TIS w/c pt changed his shirt with max A- pt initiated pulling shirt over head which is an improvement from usual performance. Pt then completed oral hygiene with total A. Pt washed face with only 1 vc to initiate, tactile cues required to terminate task. Pt was taken to [employer] room and completed functional reaching with lights off to reduce stimuli/distraction. Pt spontaneously/accurately reached 3 buttons- unclear if this was luck or if pt was intentional. Pt required max facilitation to hold head up during scanning. Otherwise pt was able to initiate with mod cueing and often required [facility] to reach button. Pt then completed functional reaching in his chair and rubbed shaving cream onto a mirror on his R side. Pt required only mod cueing to attend and demonstrated improved attention to R visual field during the task with an interest in the unique sensory aspect of the shaving cream. Pt was returned to his room and left sitting up with chair alarm belt, seat belt fastened, and lap tray  in place.   Session 2:  Pt received sitting up in the TIS w/c with no c/o pain. Pt was transported to the therapy gym. Standing frame was used to assist pt in completing 2x prolonged stands, 5 min and 2 min. First trial pt required max A to weight shift off L side and max facilitation to lift head. Second trial pt's RLE was buckling and he was unable to support even slight upright posture, 2/2 pain and fatigue. Pt returned to his room and was assisted in shaving his beard, per his request. Total A required for safety. Pt was left sitting up with LUE positioned and all needs met. Chair alarm belt set.   Therapy Documentation Precautions:  Precautions Precautions: Fall Precaution Comments: Dense Lt hemi, pusher Restrictions Weight Bearing Restrictions: No   Therapy/Group: Individual Therapy  [name] 07/24/2019, 7:11 AM

## 2019-07-25 ENCOUNTER — Inpatient Hospital Stay (HOSPITAL_COMMUNITY): Payer: Medicare Other

## 2019-07-25 DIAGNOSIS — D649 Anemia, unspecified: Secondary | ICD-10-CM

## 2019-07-25 DIAGNOSIS — R05 Cough: Secondary | ICD-10-CM

## 2019-07-25 DIAGNOSIS — G8110 Spastic hemiplegia affecting unspecified side: Secondary | ICD-10-CM

## 2019-07-25 DIAGNOSIS — R6510 Systemic inflammatory response syndrome (SIRS) of non-infectious origin without acute organ dysfunction: Secondary | ICD-10-CM

## 2019-07-25 LAB — URINALYSIS, COMPLETE (UACMP) WITH MICROSCOPIC
Bacteria, UA: NONE SEEN
Bilirubin Urine: NEGATIVE
Glucose, UA: NEGATIVE mg/dL
Hgb urine dipstick: NEGATIVE
Ketones, ur: NEGATIVE mg/dL
Leukocytes,Ua: NEGATIVE
Nitrite: NEGATIVE
Protein, ur: NEGATIVE mg/dL
Specific Gravity, Urine: 1.017 (ref 1.005–1.030)
pH: 7 (ref 5.0–8.0)

## 2019-07-25 LAB — CBC
HCT: 27 % — ABNORMAL LOW (ref 39.0–52.0)
Hemoglobin: 8.6 g/dL — ABNORMAL LOW (ref 13.0–17.0)
MCH: 26.7 pg (ref 26.0–34.0)
MCHC: 31.9 g/dL (ref 30.0–36.0)
MCV: 83.9 fL (ref 80.0–100.0)
Platelets: 135 10*3/uL — ABNORMAL LOW (ref 150–400)
RBC: 3.22 MIL/uL — ABNORMAL LOW (ref 4.22–5.81)
RDW: 15.8 % — ABNORMAL HIGH (ref 11.5–15.5)
WBC: 8.2 10*3/uL (ref 4.0–10.5)
nRBC: 0 % (ref 0.0–0.2)

## 2019-07-25 MED ORDER — CARVEDILOL 3.125 MG PO TABS: 3.1250 mg | ORAL_TABLET | Freq: Two times a day (BID) | ORAL | Status: DC

## 2019-07-25 MED ORDER — METHYLPHENIDATE HCL 5 MG PO TABS
5.0000 mg | ORAL_TABLET | Freq: Two times a day (BID) | ORAL | Status: DC
  Administered 2019-07-25 – 2019-07-26 (×2): 5 mg via ORAL
  Filled 2019-07-25 (×2): qty 1

## 2019-07-25 MED ORDER — BACLOFEN 5 MG HALF TABLET
5.0000 mg | ORAL_TABLET | Freq: Two times a day (BID) | ORAL | Status: DC
  Administered 2019-07-25 – 2019-07-30 (×10): 5 mg via ORAL
  Filled 2019-07-25 (×10): qty 1

## 2019-07-25 MED ORDER — LIDOCAINE HCL URETHRAL/MUCOSAL 2 % EX GEL: 1.0000 "application " | Freq: Once | CUTANEOUS | Status: DC

## 2019-07-25 NOTE — Progress Notes (Signed)
MD notified of bp 88/60-102/68; HR 106-118; meds changed; Notified of pt's coughing  And low grade fever this morning 100.7 to 100.4 per report; temp recheck 98.7 ;ordered for CXR

## 2019-07-25 NOTE — Progress Notes (Signed)
Physical Therapy Session Note  Patient Details  Name: Kyle Garcia MRN: [id number] Date of Birth: 1951-04-25  Today's Date: 07/25/2019 PT Individual Time: 0800-0845 PT Individual Time Calculation (min): 45 min   Short Term Goals: Week 3:  PT Short Term Goal 1 (Week 3): Pt will perform bed mobility with max assist +1. PT Short Term Goal 2 (Week 3): Pt will complete bed<>w/c with max assist +1. PT Short Term Goal 3 (Week 3): Pt will consistently demonstrate static sitting balance with mod assist +1.  Skilled Therapeutic Interventions/Progress Updates:     Patient in bed asleep upon PT arrival. Patient difficult to arouse and somnolent this morning with verbal and tactile stimulation. Required sternal rub to arouse and woke up coughing. He initially did not respond verbally even when awake, per therapy tech, he is usually quite verbose. Discussed patients status with RN who reported that he did not sleep much last night due to coughing. Vitals: BP: 99/58 and HR: 108 in supine. Preceded with sitting EOB, mobility as below. Vitals in sitting; 89/60 HR 118 and patient reported mild "wooziness." Sat EOB for 2 min and patient no longer "woozy" then retook vitals in sitting: BP: 102/68 HR: 118. Sat EOB and performed NMR as noted below, then returned patient to supine in bed. Vitals in supine: BP: 99/58 HR:106. Patient remained lethargic throughout session closing his eyes several times, with some improvement sitting EOB. Remained quiet throughout session speaking short phrased to therapist only when spoken to, frequently asking "what animal is it?" L hand splint and PRAFO doffed during session for mobility and donned again at end of session. Noted L UE and LE flexor synergy during coughing in supine, attempted to maintain neutral position with L LE using kick stand on PRAFO to prevent external rotation and a pillow on the lateral side of his leg at end of session.   Therapeutic Activity: Bed Mobility: PT  donned B TED hose, pants, and socks in supine with total A. Patient performed rolling R and L with min-mod A to don pants and performed supine to sit with mod A and sit to supine with max A. Provided verbal cues for using bed rails for rolling, attending to L UE with mobility, and rolling to his R to push to sit up.  Neuromuscular Re-ed: Patient performed sitting balance for trunk and head control for 10 min sitting EOB with mod-min A and supervision x5 seconds x2. Required manual facilitation to maintain midline in sitting, pushing to the R. Able to self correct x4 with cues. Also required manual facilitation for head control to lift head up to neutral from a flexed position. Patient unable to maintain position without assist, however initiated movement with facilitation.    Patient in bed at end of session with breaks locked, bed alarm set, and all needs within reach.    Therapy Documentation Precautions:  Precautions Precautions: Fall Precaution Comments: Dense Lt hemi, pusher Restrictions Weight Bearing Restrictions: No Pain: Patient did not report any pain during session and showed no outward signs of pain throughout session.    Therapy/Group: Individual Therapy  Eltha Tingley L Ali Mohl PT, DPT  07/25/2019, 12:18 PM

## 2019-07-25 NOTE — Plan of Care (Signed)
  Problem: RH BOWEL ELIMINATION Goal: RH STG MANAGE BOWEL WITH ASSISTANCE Description: STG Manage Bowel with MOD I  Assistance. Outcome: Not Progressing; incontinence   Problem: RH BLADDER ELIMINATION Goal: RH STG MANAGE BLADDER WITH ASSISTANCE Description: STG Manage Bladder With MIN Assistance Outcome: Not Progressing; incontinence   Problem: RH KNOWLEDGE DEFICIT Goal: RH STG INCREASE KNOWLEDGE OF HYPERTENSION Description: Pt and wife will state understanding of HTN using medications, dietary restrictions, exercise and using handouts and educational resources independently Outcome: Not Progressing; confusion

## 2019-07-25 NOTE — Progress Notes (Signed)
Patient incontinent in and out cath done but has resistance unable to get urine sample. Will let MD aware.

## 2019-07-25 NOTE — Progress Notes (Addendum)
[place] PHYSICAL MEDICINE & REHABILITATION PROGRESS NOTE   Subjective/Complaints: Patient seen laying in bed this morning.  He is only briefly arouses.  Discussed with nursing who states patient did not sleep well overnight due to incontinence and needing to be cleaned.  Later informed by nursing regarding chronic cough, hypotension, fever.  ROS: Limited due to somnolence  Objective:   No results found. No results for input(s): WBC, HGB, HCT, PLT in the last 72 hours. Recent Labs    07/23/19 0520  NA 135  K 3.7  CL 103  CO2 24  GLUCOSE 110*  BUN 21  CREATININE 0.68  CALCIUM 8.4*    Intake/Output Summary (Last 24 hours) at 07/25/2019 1008 Last data filed at 07/25/2019 0200 Gross per 24 hour  Intake 1097.34 ml  Output -  Net 1097.34 ml     Physical Exam: Vital Signs Blood pressure (!) 99/58, pulse (!) 108, temperature 98.7 F (37.1 C), temperature source Oral, resp. rate 14, height 5\' 11"  (1.803 m), weight 62.1 kg, SpO2 97 %. Constitutional: No distress . Vital signs reviewed. HENT: Normocephalic.  Atraumatic. Eyes: EOMI. No discharge. Cardiovascular: No JVD. Respiratory: Normal effort.  Bilaterally clear to auscultation. GI: Non-distended. Musc: No edema or tenderness in extremities. Neurologic: Somnolent, difficult to arouse  Unable to assess motor function, previously motor strength is 5/5 in Right and 0/5 left  deltoid, bicep, tricep, grip, hip flexor, knee extensors, ankle dorsiflexor and plantar flexor--2/4 tone flexors LUE, hamstrings, gastrocs on left per report Psych: Somnolent  Assessment/Plan: 1. Functional deficits secondary to Right MCA infarct with dysphagia  which require 3+ hours per day of interdisciplinary therapy in a comprehensive inpatient rehab setting.  Physiatrist is providing close team supervision and 24 hour management of active medical problems listed below.  Physiatrist and rehab team continue to assess barriers to discharge/monitor  patient progress toward functional and medical goals  Care Tool:  Bathing    Body parts bathed by patient: Face   Body parts bathed by helper: Chest, Abdomen, Face     Bathing assist Assist Level: Dependent - Patient 0%     Upper Body Dressing/Undressing Upper body dressing   What is the patient wearing?: Pull over shirt    Upper body assist Assist Level: Total Assistance - Patient < 25%    Lower Body Dressing/Undressing Lower body dressing      What is the patient wearing?: Pants, Incontinence brief     Lower body assist Assist for lower body dressing: Dependent - Patient 0%     Toileting Toileting    Toileting assist Assist for toileting: Dependent - Patient 0%     Transfers Chair/bed transfer  Transfers assist     Chair/bed transfer assist level: 2 Helpers     Locomotion Ambulation   Ambulation assist   Ambulation activity did not occur: Safety/medical concerns  Assist level: 2 helpers Assistive device: No Device Max distance: 10   Walk 10 feet activity   Assist  Walk 10 feet activity did not occur: Safety/medical concerns  Assist level: 2 helpers Assistive device: No Device   Walk 50 feet activity   Assist Walk 50 feet with 2 turns activity did not occur: Safety/medical concerns         Walk 150 feet activity   Assist Walk 150 feet activity did not occur: Safety/medical concerns         Walk 10 feet on uneven surface  activity   Assist Walk 10 feet on uneven surfaces  activity did not occur: Safety/medical concerns         Wheelchair     Assist Will patient use wheelchair at discharge?: Yes Type of Wheelchair: Manual    Wheelchair assist level: Dependent - Patient 0%(TIS w/c) Max wheelchair distance: 150 ft    Wheelchair 50 feet with 2 turns activity    Assist        Assist Level: Dependent - Patient 0%   Wheelchair 150 feet activity     Assist     Assist Level: Dependent - Patient 0%     Medical Problem List and Plan: 1.Dense lefthemiparesiswith dysphagiasecondary to embolic shower right MCA occlusion status post reperfusion. Infarct felt to be embolic secondary to hypercoagulable state from lung cancer  Continue CIR  -PRAFO, WHO Left side  -significant visual-spatial deficits on left remain 2. Antithrombotics: -DVT/anticoagulation/history of right lower extremity GSU:[id number] -antiplatelet therapy: N/A 3. Pain Management/spastic left hemiparesis:Tylenol as needed  -antispasmodics   -continue low dose baclofen  5mg  TID, decreased back to twice daily on 8/1, will consider decreasing dose further if somnolence persistent issue   -orthotics 4. Mood:Provide emotional support -antipsychotic agents: N/A  increased ritalin to 10mg  bid, decreased on 8/1 5. Neuropsych: This patientiscapable of making decisions on hisown behalf. 6. Skin/Wound Care:Routine skin checks 7. Fluids/Electrolytes/Nutrition:   -continue IVF through the weekend. Dc Monday  -BUN/Cr stable on 7/30 8.Stage IV adenocarcinoma right lung. Chemotherapy as per [name]  -onc appts on temporary hold until after rehab discharge 9. Aspiration pneumonia.   Unasyn completed  WBC 8.3  -aspiration precautions 10.  Post stroke dysphagia.   D2 nectars  11. Hyperlipidemia. Crestor 12. Remote tobacco abuse. Counseling 13.Acute on chronic anemia.   Hemoglobin 8.9 on 7/28  Labs ordered  Continue to monitor  14 Hypocalcemia-stable at 8.4 on 7/30  -likely related to #8 15.  SIRS-tachycardia, fevers, hypotension  Tachycardia with low BP, Lopressor DC'd on 8/1 due to hypotension, will consider Coreg   If work-up unremarkable, may need to decrease methylphenidate further.  Discussed with therapies regarding function today- difficult to arouse, however had not work with patient prior to today.  Possibly secondary to hypoperfusion versus infection versus  medications.  Labs ordered  Chest x-ray ordered  UA/urine culture ordered  Blood cultures ordered    LOS: 15 days A FACE TO FACE EVALUATION WAS PERFORMED   Lorie Phenix 07/25/2019, 10:08 AM

## 2019-07-25 NOTE — Progress Notes (Signed)
Patient is more alert , conversant but pleasantly confused easily redirected ; able to take meds

## 2019-07-26 DIAGNOSIS — R000 Tachycardia, unspecified: Secondary | ICD-10-CM

## 2019-07-26 LAB — BASIC METABOLIC PANEL
Anion gap: 8 (ref 5–15)
BUN: 16 mg/dL (ref 8–23)
CO2: 25 mmol/L (ref 22–32)
Calcium: 8.1 mg/dL — ABNORMAL LOW (ref 8.9–10.3)
Chloride: 101 mmol/L (ref 98–111)
Creatinine, Ser: 0.68 mg/dL (ref 0.61–1.24)
GFR calc Af Amer: >60 mL/min (ref >60)
GFR calc non Af Amer: >60 mL/min (ref >60)
Glucose, Bld: 117 mg/dL — ABNORMAL HIGH (ref 70–99)
Potassium: 3.6 mmol/L (ref 3.5–5.1)
Sodium: 134 mmol/L — ABNORMAL LOW (ref 135–145)

## 2019-07-26 MED ORDER — CARVEDILOL 3.125 MG PO TABS
3.1250 mg | ORAL_TABLET | Freq: Two times a day (BID) | ORAL | Status: DC
  Administered 2019-07-26 – 2019-07-28 (×4): 3.125 mg via ORAL
  Filled 2019-07-26 (×4): qty 1

## 2019-07-26 NOTE — Progress Notes (Signed)
Kyle Garcia PHYSICAL MEDICINE & REHABILITATION PROGRESS NOTE   Subjective/Complaints: Patient seen sitting up in bed this morning.  He states he slept well overnight.,  No reported issues.  ROS: Limited due to cognition, but appears to deny CP, shortness of breath, nausea, vomiting, diarrhea.  Objective:   Dg Chest 2 View  Result Date: 07/25/2019 CLINICAL DATA:  Cough and confusion. EXAM: CHEST - 2 VIEW COMPARISON:  July 09, 2019 FINDINGS: The mediastinal contour and cardiac silhouette are normal. Right mid and lower lung pneumonia is unchanged. The left lung is clear. There is no pleural effusion. The bony structures are stable. IMPRESSION: Right mid and lower lung pneumonia is unchanged. Electronically Signed   By: Abelardo Diesel M.D.   On: 07/25/2019 10:55   Recent Labs    07/25/19 1044  WBC 8.2  HGB 8.6*  HCT 27.0*  PLT 135*   Recent Labs    07/26/19 0726  NA 134*  K 3.6  CL 101  CO2 25  GLUCOSE 117*  BUN 16  CREATININE 0.68  CALCIUM 8.1*    Intake/Output Summary (Last 24 hours) at 07/26/2019 0958 Last data filed at 07/26/2019 0846 Gross per 24 hour  Intake 1457.02 ml  Output 700 ml  Net 757.02 ml     Physical Exam: Vital Signs Blood pressure 135/86, pulse (!) 123, temperature 99.7 F (37.6 C), temperature source Oral, resp. rate 18, height 5\' 11"  (1.803 m), weight 62.1 kg, SpO2 99 %. Constitutional: No distress . Vital signs reviewed. HENT: Normocephalic.  Atraumatic. Eyes: No discharge.  Right gaze preference Cardiovascular: No JVD. Respiratory: Normal effort.  GI: Non-distended. Musc: No edema or tenderness in extremities. Neurologic: Alert Motor: RUE/RLE: Grossly 5/5  LUE/LLE: 0/5 Left neglect Increased tone noted Psych: Difficult to assess due to cognition  Assessment/Plan: 1. Functional deficits secondary to Right MCA infarct with dysphagia  which require 3+ hours per day of interdisciplinary therapy in a comprehensive inpatient rehab  setting.  Physiatrist is providing close team supervision and 24 hour management of active medical problems listed below.  Physiatrist and rehab team continue to assess barriers to discharge/monitor patient progress toward functional and medical goals  Care Tool:  Bathing    Body parts bathed by patient: Face   Body parts bathed by helper: Chest, Abdomen, Face     Bathing assist Assist Level: Dependent - Patient 0%     Upper Body Dressing/Undressing Upper body dressing   What is the patient wearing?: Pull over shirt    Upper body assist Assist Level: Total Assistance - Patient < 25%    Lower Body Dressing/Undressing Lower body dressing      What is the patient wearing?: Pants, Incontinence brief     Lower body assist Assist for lower body dressing: Dependent - Patient 0%     Toileting Toileting    Toileting assist Assist for toileting: Dependent - Patient 0%     Transfers Chair/bed transfer  Transfers assist     Chair/bed transfer assist level: 2 Helpers     Locomotion Ambulation   Ambulation assist   Ambulation activity did not occur: Safety/medical concerns  Assist level: 2 helpers Assistive device: No Device Max distance: 10   Walk 10 feet activity   Assist  Walk 10 feet activity did not occur: Safety/medical concerns  Assist level: 2 helpers Assistive device: No Device   Walk 50 feet activity   Assist Walk 50 feet with 2 turns activity did not occur: Safety/medical concerns  Walk 150 feet activity   Assist Walk 150 feet activity did not occur: Safety/medical concerns         Walk 10 feet on uneven surface  activity   Assist Walk 10 feet on uneven surfaces activity did not occur: Safety/medical concerns         Wheelchair     Assist Will patient use wheelchair at discharge?: Yes Type of Wheelchair: Manual    Wheelchair assist level: Dependent - Patient 0%(TIS w/c) Max wheelchair distance: 150 ft     Wheelchair 50 feet with 2 turns activity    Assist        Assist Level: Dependent - Patient 0%   Wheelchair 150 feet activity     Assist     Assist Level: Dependent - Patient 0%    Medical Problem List and Plan: 1.Dense lefthemiparesis, now with spasticitywith dysphagiasecondary to embolic shower right MCA occlusion status post reperfusion. Infarct felt to be embolic secondary to hypercoagulable state from lung cancer  Continue CIR  -PRAFO, WHO Left side  -significant visual-spatial deficits on left remain 2. Antithrombotics: -DVT/anticoagulation/history of right lower extremity LNL:GXQJJHE -antiplatelet therapy: N/A 3. Pain Management/spastic left hemiparesis:Tylenol as needed  -antispasmodics   -continue low dose baclofen  5mg  TID, decreased back to twice daily on 8/1 due to somnolence   -orthotics 4. Mood:Provide emotional support -antipsychotic agents: N/A  increased ritalin to 10mg  bid, decreased on 8/1, DC'd on 8/2 due to tachycardia, may consider retrial if beneficial once heart rate improves 5. Neuropsych: This patientiscapable of making decisions on hisown behalf. 6. Skin/Wound Care:Routine skin checks 7. Fluids/Electrolytes/Nutrition:   -continue IVF through the weekend. Dc tomorrow  -BUN/Cr stable on 7/30 8.Stage IV adenocarcinoma right lung. Chemotherapy as per [name]  -onc appts on temporary hold until after rehab discharge 9. Aspiration pneumonia.   Unasyn completed  WBCs 8.2 on 8/1 Aspiration precautions  Chest x-ray personally reviewed- persistent opacity, pneumonia per report, however clinically appears to be improving, isolated fevers on 8/1 that appear to have self resolved.  If required, will consider restarting ABX.  Will monitor for now and consider further work-up given #8 10.  Post stroke dysphagia.   D2 nectars  11. Hyperlipidemia. Crestor 12. Remote tobacco abuse.  Counseling 13.Acute on chronic anemia.   Hemoglobin 8.6 on 8/1  Continue to monitor  14 Hypocalcemia-stable at 8.4 on 7/30  -likely related to #8 15.  SIRS-tachycardia, fevers, hypotension  Afebrile x24 hours, blood pressure proved with change in medications  Ensure appropriate perfusion  Chest x-ray ordered, see #9  UA unremarkable, urine culture pending  Blood cultures pending 16.  Tachycardia  ECG on 7/10 reviewed showing sinus tachycardia  Methylphenidate DC'd  Unable to tolerate Lopressor  Coreg 3.125 started on 8/2  LOS: 16 days A FACE TO FACE EVALUATION WAS PERFORMED  Kyle Garcia Lorie Phenix 07/26/2019, 9:58 AM

## 2019-07-27 ENCOUNTER — Inpatient Hospital Stay (HOSPITAL_COMMUNITY): Payer: Medicare Other | Admitting: Speech Pathology

## 2019-07-27 ENCOUNTER — Inpatient Hospital Stay (HOSPITAL_COMMUNITY): Payer: Medicare Other | Admitting: Physical Therapy

## 2019-07-27 ENCOUNTER — Inpatient Hospital Stay (HOSPITAL_COMMUNITY): Payer: Medicare Other

## 2019-07-27 ENCOUNTER — Inpatient Hospital Stay (HOSPITAL_COMMUNITY): Payer: Medicare Other | Admitting: Occupational Therapy

## 2019-07-27 LAB — URINE CULTURE: Culture: <10000 — AB

## 2019-07-27 NOTE — Progress Notes (Signed)
Occupational Therapy Session Note  Patient Details  Name: Kyle Garcia MRN: [id number] Date of Birth: 1951/04/05  Today's Date: 07/27/2019 OT Individual Time: 1400-1425 OT Individual Time Calculation (min): 25 min    Short Term Goals: Week 2:  OT Short Term Goal 1 (Week 2): Pt will locate 1 item at midline with MAX VC OT Short Term Goal 2 (Week 2): Pt will maintain static sitting balance for 10 seconds with MOD A and max cuing OT Short Term Goal 3 (Week 2): Pt will sustain attention to wash 1 body part wiht min VC  Skilled Therapeutic Interventions/Progress Updates:    Pt resting in bed upon arrival.  OT intervention with focus on bed mobility, following one step commands, scanning for objects, LUE PROM/AAROM.  Pt with difficulty following command to locate cup placed on table.  Pt unable to locate cup.  Max tactile cues to follow commands.  Max tactile cues to scan to LUE when therapist performing PROM.  Pt unable to perform SAAROM. Pt remained in bed with all needs within reach and bed alarm activated.   Therapy Documentation Precautions:  Precautions Precautions: Fall Precaution Comments: Dense Lt hemi, pusher Restrictions Weight Bearing Restrictions: No Pain: Pt with no s/s of pain.  Therapy/Group: Individual Therapy  Leroy Libman 07/27/2019, 2:27 PM

## 2019-07-27 NOTE — Progress Notes (Signed)
Speech Language Pathology Daily Session Note  Patient Details  Name: Kyle Garcia MRN: [id number] Date of Birth: Aug 13, 1951  Today's Date: 07/27/2019 SLP Individual Time: 1025-1105 SLP Individual Time Calculation (min): 40 min  Short Term Goals: Week 3: SLP Short Term Goal 1 (Week 3): Patient will consume dysphagia 2 diet with nectar thick liquids via spoon to achieve minimal overt s/s of aspiration and Min verbal cues for use of swallowing compensatory strategies. SLP Short Term Goal 2 (Week 3): Pt will consume nectar thick liquids via cup with minimal overt s/s of aspiration across 3 sessions to demonstrate readiness for consumption via cup. SLP Short Term Goal 3 (Week 3): Patient will demonstrate sustained attention to functional tasks for 5 minutes with Max A verbal cues for redirection. SLP Short Term Goal 4 (Week 3): Given Mod A cues, pt will follow 1 step basic directions in 5 out of 10 opportunities. SLP Short Term Goal 5 (Week 3): Patient will initiate functional tasks in 5 out of 10 opportunities with Mod A multimodal cues SLP Short Term Goal 6 (Week 3): Patient will scan to midline to locate functional items in 5/10 opportunities with Mod A cues.  Skilled Therapeutic Interventions: Skilled treatment session focused on dysphagia and cognitive goals. Upon arrival, patient was supine in bed while on the bedpan. Patient was continent of bowel and required Max A verbal cues for initiation and following commands with bed mobility. Patient requested an "ice cream" and required Max hand over hand assist for self-feeding and scanning to midline. Patient with constant and consistent coughing throughout session that did not appear to increase during PO intake. Patient with language of confusion and lethargy requiring Max verbal cues for arousal. RN aware. Patient left upright in bed with alarm on and all needs within reach. Continue with current plan of care.      Pain No/Denies Pain    Therapy/Group: Individual Therapy  Aadya Kindler 07/27/2019, 11:07 AM

## 2019-07-27 NOTE — Progress Notes (Signed)
Occupational Therapy Session Note  Patient Details  Name: Kyle Garcia MRN: [id number] Date of Birth: August 01, 1951  Today's Date: 07/27/2019 OT Individual Time: 5366-4403 OT Individual Time Calculation (min): 72 min    Short Term Goals: Week 2:  OT Short Term Goal 1 (Week 2): Pt will locate 1 item at midline with MAX VC OT Short Term Goal 2 (Week 2): Pt will maintain static sitting balance for 10 seconds with MOD A and max cuing OT Short Term Goal 3 (Week 2): Pt will sustain attention to wash 1 body part wiht min VC  Skilled Therapeutic Interventions/Progress Updates:    patient seated in bed, alert, ongoing confusion related to awareness of surroundings, profound left neglect.  Dependent to roll right, min a to roll left.  Completed hygiene and changed soiled brief - dependent, max A/dependent LB bathing and dressing at bed level.  Side lying to SSP max A.  Mod A to maintain balance in SSP - able to maintain balance with CS when right elbow on pillow in lateral leaning position.  Sit pivot transfer bed to w/c max A of one with CS of 2nd person.  UB bathing and dressing completed w/c level - max A/dependent - unable to orient shirt to place right arm into sleeve.  Oral care with mod A and cues for safety.  Shaving with electric razor max A.  Completed left UE inhibition, scapular mobility and positioning activity.  He is seated in TIS w/c in reclined position with seat belt alarm set, lap tray, call bell in reach.    Therapy Documentation Precautions:  Precautions Precautions: Fall Precaution Comments: Dense Lt hemi, pusher Restrictions Weight Bearing Restrictions: No General:   Vital Signs:  Pain: Pain Assessment Pain Scale: 0-10 Pain Score: 0-No pain   Other Treatments:     Therapy/Group: Individual Therapy  Carlos Levering 07/27/2019, 11:29 AM

## 2019-07-27 NOTE — Progress Notes (Signed)
Nutrition Follow-up  DOCUMENTATION CODES:   Not applicable  INTERVENTION:   - Vital Cuisine Shake TID with meals, each supplement provides 520 kcal and 22 grams of protein  - Continue Magic cup TID with meals, each supplement provides 290 kcal and 9 grams of protein  - Continue MVI with minerals daily  - d/c Ensure Enlive  NUTRITION DIAGNOSIS:   Increased nutrient needs related to lethargy/confusion (recent CVA; CIR for rehab) as evidenced by estimated needs.  Ongoing, being addressed via oral nutrition supplements  GOAL:   Patient will meet greater than or equal to 90% of their needs  Progressing  MONITOR:   PO intake, Supplement acceptance, Labs, Weight trends, Diet advancement, I & O's, Skin  REASON FOR ASSESSMENT:   Malnutrition Screening Tool    ASSESSMENT:   68 y/o male HLD, GERD, Stage IV lung cancer and recurrent strokes. Presents to CIR following hospitalization 3/35-4/56 for embolic shower and R MCA occlusion s/p attempted mechanical thrombectomy of multiple vessels.  7/31 - diet advanced to Dysphagia 2 with nectar-thick liquids  Pt's diet has been advanced from honey-thick liquids to nectar-thick liquids. RD will d/c Ensure Enlive and order Hormel Shake with meals as it is already thickened to the appropriate consistency.  Pt's weight has been fairly stable over the last 1.5 weeks and is down a total of 3 lbs since admission to CIR. Will continue to monitor trends. PO intake has improved greatly.  Meal Completion: 25-100 x last 8 meals (averaging 88%)  Medications reviewed and include: Oscal with D, Ensure Enlive BID, folic acid, MVI with minerals  Labs reviewed: sodium 134, hemoglobin 8.6  Diet Order:   Diet Order            DIET DYS 2 Room service appropriate? Yes; Fluid consistency: Nectar Thick  Diet effective now              EDUCATION NEEDS:   No education needs have been identified at this time  Skin:  Skin Assessment: Reviewed RN  Assessment (MASD to groin)  Last BM:  07/27/19  Height:   Ht Readings from Last 1 Encounters:  07/20/19 5\' 11"  (1.803 m)    Weight:   Wt Readings from Last 1 Encounters:  07/27/19 62.1 kg    Ideal Body Weight:  78.2 kg  BMI:  Body mass index is 19.09 kg/m.  Estimated Nutritional Needs:   Kcal:  2000-2200  Protein:  100-115 grams  Fluid:  >/= 2.0 L    Gaynell Face, MS, RD, LDN Inpatient Clinical Dietitian Pager: [phone] Weekend/After Hours: [phone]

## 2019-07-27 NOTE — Progress Notes (Signed)
Physical Therapy Session Note  Patient Details  Name: Kyle Garcia MRN: [id number] Date of Birth: Aug 02, 1951  Today's Date: 07/27/2019 PT Individual Time: 1116-1158 PT Individual Time Calculation (min): 42 min   Short Term Goals: Week 3:  PT Short Term Goal 1 (Week 3): Pt will perform bed mobility with max assist +1. PT Short Term Goal 2 (Week 3): Pt will complete bed<>w/c with max assist +1. PT Short Term Goal 3 (Week 3): Pt will consistently demonstrate static sitting balance with mod assist +1.  Skilled Therapeutic Interventions/Progress Updates:  Pt received in bed. Pt with c/o pain in back with rolling & bed mobility with repositioning & rest breaks provided. Pt requires max cuing to use RUE to push to sitting upright to assist in sidelying>sitting with total assist. Pt transfers bed>w/c and w/c<>mat table with +2 slide board dependent assist. While sitting on EOM focused on midline orientation, head control & holding head up, posture, sitting balance, and scanning to midline with eyes. Pt with some improvement in scanning to midline with eyes to locate objects and is very briefly (1-2 seconds) able to hold head up without assistance. Pt requires MAX assist to facilitate upright posture. Pt briefly able to maintain static sitting balance with RUE support and supervision for ~2 seconds only before total LOB & pt unable to recognize nor correct. Pt continues to push with RUE while in sitting but not as strongly on this date. Pt performed lateral leans onto RUE elbow with max multimodal cuing. Pt is able to transfer to supine and maintain B hip extension to neutral for passive stretch with pt able to tolerate this with LLE better than RLE as pt reports back pain associated with RLE. Pt transitions to L sidelying for passive neck stretch. At end of session pt left in TIS w/c with chair alarm & lap tray donned, call bell in lap.   Therapy Documentation Precautions:  Precautions Precautions:  Fall Precaution Comments: Dense Lt hemi, pusher Restrictions Weight Bearing Restrictions: No    Therapy/Group: Individual Therapy  [name] 07/27/2019, 12:28 PM

## 2019-07-27 NOTE — Progress Notes (Signed)
[place] PHYSICAL MEDICINE & REHABILITATION PROGRESS NOTE   Subjective/Complaints: Pt with OT. They are stretching and working towards getting in w/c  ROS: Patient denies fever, rash, sore throat, blurred vision, nausea, vomiting, diarrhea, cough, shortness of breath or chest pain, joint or back pain, headache, or mood change.   Objective:   No results found. Recent Labs    07/25/19 1044  WBC 8.2  HGB 8.6*  HCT 27.0*  PLT 135*   Recent Labs    07/26/19 0726  NA 134*  K 3.6  CL 101  CO2 25  GLUCOSE 117*  BUN 16  CREATININE 0.68  CALCIUM 8.1*    Intake/Output Summary (Last 24 hours) at 07/27/2019 1042 Last data filed at 07/27/2019 0700 Gross per 24 hour  Intake 1562.22 ml  Output -  Net 1562.22 ml     Physical Exam: Vital Signs Blood pressure 116/75, pulse (!) 114, temperature 97.7 F (36.5 C), temperature source Oral, resp. rate 19, height 5\' 11"  (1.803 m), weight 62.1 kg, SpO2 98 %. Constitutional: No distress . Vital signs reviewed. HEENT: EOMI, oral membranes moist Neck: supple Cardiovascular: RRR without murmur. No JVD    Respiratory: CTA Bilaterally without wheezes or rales. Normal effort    GI: BS +, non-tender, non-distended  Musc: No edema or tenderness in extremities. Neurologic: Alert Motor: RUE/RLE: Grossly 5/5  LUE/LLE: 0/5 Left neglect, left upper and lower ext tone 2/4, DTR's 3+ Increased tone noted Psych: matter of fact Assessment/Plan: 1. Functional deficits secondary to Right MCA infarct with dysphagia  which require 3+ hours per day of interdisciplinary therapy in a comprehensive inpatient rehab setting.  Physiatrist is providing close team supervision and 24 hour management of active medical problems listed below.  Physiatrist and rehab team continue to assess barriers to discharge/monitor patient progress toward functional and medical goals  Care Tool:  Bathing    Body parts bathed by patient: Face   Body parts bathed by helper:  Chest, Abdomen, Face     Bathing assist Assist Level: Dependent - Patient 0%     Upper Body Dressing/Undressing Upper body dressing   What is the patient wearing?: Pull over shirt    Upper body assist Assist Level: Total Assistance - Patient < 25%    Lower Body Dressing/Undressing Lower body dressing      What is the patient wearing?: Pants, Incontinence brief     Lower body assist Assist for lower body dressing: Dependent - Patient 0%     Toileting Toileting    Toileting assist Assist for toileting: Dependent - Patient 0%     Transfers Chair/bed transfer  Transfers assist     Chair/bed transfer assist level: 2 Helpers     Locomotion Ambulation   Ambulation assist   Ambulation activity did not occur: Safety/medical concerns  Assist level: 2 helpers Assistive device: No Device Max distance: 10   Walk 10 feet activity   Assist  Walk 10 feet activity did not occur: Safety/medical concerns  Assist level: 2 helpers Assistive device: No Device   Walk 50 feet activity   Assist Walk 50 feet with 2 turns activity did not occur: Safety/medical concerns         Walk 150 feet activity   Assist Walk 150 feet activity did not occur: Safety/medical concerns         Walk 10 feet on uneven surface  activity   Assist Walk 10 feet on uneven surfaces activity did not occur: Safety/medical concerns  Wheelchair     Assist Will patient use wheelchair at discharge?: Yes Type of Wheelchair: Manual    Wheelchair assist level: Dependent - Patient 0%(TIS w/c) Max wheelchair distance: 150 ft    Wheelchair 50 feet with 2 turns activity    Assist        Assist Level: Dependent - Patient 0%   Wheelchair 150 feet activity     Assist     Assist Level: Dependent - Patient 0%    Medical Problem List and Plan: 1.Dense lefthemiparesis, now with spasticitywith dysphagiasecondary to embolic shower right MCA occlusion  status post reperfusion. Infarct felt to be embolic secondary to hypercoagulable state from lung cancer  Continue CIR  -PRAFO, WHO Left side  -significant visual-spatial deficits on left remain 2. Antithrombotics: -DVT/anticoagulation/history of right lower extremity KKX:[id number] -antiplatelet therapy: N/A 3. Pain Management/spastic left hemiparesis:Tylenol as needed  -antispasmodics   -can't tolerate more than 5mg  BID   -orthotics 4. Mood:Provide emotional support -antipsychotic agents: N/A  -resume ritalin at 5mg  bid (was tachycardic prior to ritalin)  5. Neuropsych: This patientiscapable of making decisions on hisown behalf. 6. Skin/Wound Care:Routine skin checks 7. Fluids/Electrolytes/Nutrition:   -continue IVF through the weekend. Dc tomorrow  -BUN/Cr stable on 8/2 8.Stage IV adenocarcinoma right lung. Chemotherapy as per [name]  -onc appts on temporary hold until after rehab discharge 9. Aspiration pneumonia, low grade temp.   Unasyn completed  WBCs 8.2 on 8/1 Aspiration precautions  CXR with persistent opacities, no change, afebrile currently, temp 99 over weekend  -ucx only 10k  -blood cx pending 10.  Post stroke dysphagia.   D2 nectars  11. Hyperlipidemia. Crestor 12. Remote tobacco abuse. Counseling 13.Acute on chronic anemia.   Hemoglobin 8.6 on 8/1  Continue to monitor  14 Hypocalcemia-stable at 8.4 on 7/30  -likely related to #8 15.     Tachycardia: has had since admit  ECG   showing sinus tachycardia  Unable to tolerate Lopressor???  Coreg 3.125 started on 8/2--consider further titration today  LOS: 17 days A FACE TO [place] 07/27/2019, 10:42 AM

## 2019-07-28 ENCOUNTER — Inpatient Hospital Stay (HOSPITAL_COMMUNITY): Payer: Medicare Other | Admitting: Occupational Therapy

## 2019-07-28 ENCOUNTER — Telehealth: Payer: Self-pay | Admitting: Medical Oncology

## 2019-07-28 ENCOUNTER — Inpatient Hospital Stay (HOSPITAL_COMMUNITY): Payer: Medicare Other

## 2019-07-28 ENCOUNTER — Inpatient Hospital Stay (HOSPITAL_COMMUNITY): Payer: Medicare Other | Admitting: Physical Therapy

## 2019-07-28 MED ORDER — CARVEDILOL 6.25 MG PO TABS
6.2500 mg | ORAL_TABLET | Freq: Two times a day (BID) | ORAL | Status: DC
  Administered 2019-07-28 – 2019-07-29 (×2): 6.25 mg via ORAL
  Filled 2019-07-28 (×2): qty 1

## 2019-07-28 MED ORDER — TRAMADOL HCL 50 MG PO TABS
25.0000 mg | ORAL_TABLET | Freq: Two times a day (BID) | ORAL | Status: DC
  Administered 2019-07-28 – 2019-07-29 (×2): 25 mg via ORAL
  Filled 2019-07-28 (×2): qty 1

## 2019-07-28 NOTE — Progress Notes (Signed)
Occupational Therapy Weekly Progress Note  Patient Details  Name: Kyle Garcia MRN: [id number] Date of Birth: 03-Jul-1951  Beginning of progress report period: 21-Aug-2019 End of progress report period: July 28, 2019  Today's Date: 07/28/2019 OT Individual Time: [id number] OT Individual Time Calculation (min): 70 min    Patient has met 2 of 3 short term goals.  Pt remains highly impaired, limited by severe cognitive deficits that impact his ability to meaningfully participate in therapy at times. Pt has made some gains in midline visual attention and in static sitting balance, with highly skilled positioning and cueing.   Patient continues to demonstrate the following deficits: muscle weakness and muscle joint tightness, decreased cardiorespiratoy endurance, impaired timing and sequencing, abnormal tone, unbalanced muscle activation, decreased coordination and decreased motor planning, decreased attention to right and decreased motor planning, decreased initiation, decreased attention, decreased awareness, decreased problem solving, decreased safety awareness, decreased memory and delayed processing and decreased sitting balance, decreased standing balance, decreased postural control, hemiplegia and decreased balance strategies and therefore will continue to benefit from skilled OT intervention to enhance overall performance with BADL.  Patient not progressing toward long term goals.  See goal revision..  Plan of care revisions: Several min A downgraded to mod A.  OT Short Term Goals Week 2:  OT Short Term Goal 1 (Week 2): Pt will locate 1 item at midline with MAX VC OT Short Term Goal 1 - Progress (Week 2): Met OT Short Term Goal 2 (Week 2): Pt will maintain static sitting balance for 10 seconds with MOD A and max cuing OT Short Term Goal 2 - Progress (Week 2): Met OT Short Term Goal 3 (Week 2): Pt will sustain attention to wash 1 body part wiht min VC OT Short Term Goal 3 - Progress  (Week 2): Progressing toward goal Week 3:  OT Short Term Goal 1 (Week 3): Pt will initiate bathing one body part with mod vc OT Short Term Goal 2 (Week 3): Pt will attend to one grooming task with mod vc OT Short Term Goal 3 (Week 3): Pt will don shirt with max A  Skilled Therapeutic Interventions/Progress Updates:    Pt received in TIS w/c requesting to use bathroom. Pt completed 3 muskateers transfer to shower chair (being used as commode for support) with +2 max A. Pt requires heavy cueing for upright posture and for RUE/LE positioning and management. Pt sat on shower chair with close (S) for several minutes before lurching forward rapidly, requiring max A to maintain balance. Pt attributed this to sudden onset back pain. Pt scooted forward on chair and was able to lean forward enough for peri hygiene to be completed from seated, total A. Pt returned to [place] in a similar fashion as before. Pt was taken down to the therapy gym. Total A +1 squat pivot to the therapy mat. Focus of rest of session on static sitting balance, weightbearing through LUE and upright posture. Pt tolerated posterior extension over therapy ball with stretch provided at the pec major. Pt able to briefly lift head into extension with max cueing and tactile cues. Pt completed functional reaching forward with MAX multimodal cueing. Pt pushing often and losing balance in all directions. Pt was then placed in the parallel bars to practice sit <> stands. Pt required R UE to be placed on therapist instead of bar to reduce pushing tendencies. Weightbearing through the L UE was facilitated on the bar with max A. Pt stood x4 with mod-max  lifting A, good initiation by pt but only tolerating standing for 3-10 sec at a time. Pt was transported back to his room and left sitting up with all needs met, chair alarm set.   Therapy Documentation Precautions:  Precautions Precautions: Fall Precaution Comments: Dense Lt hemi,  pusher Restrictions Weight Bearing Restrictions: No   Therapy/Group: Individual Therapy  [name] 07/28/2019, 7:11 AM

## 2019-07-28 NOTE — Patient Care Conference (Signed)
Inpatient RehabilitationTeam Conference and Plan of Care Update Date: 07/28/2019   Time: 2:55 PM    Patient Name: Kyle Garcia      Medical Record Number: [id number]  Date of Birth: 04-Sep-1951 Sex: Male         Room/Bed: 4W20C/4W20C-01 Payor Info: Payor: MEDICARE / Plan: MEDICARE PART A AND B / Product Type: *No Product type* /    Admitting Diagnosis: 2. TBI Team  RT. CVA; 22-24days  Admit Date/Time:  07/10/2019  4:34 PM Admission Comments: No comment available   Primary Diagnosis:  <principal problem not specified> Principal Problem: <principal problem not specified>  Patient Active Problem List   Diagnosis Date Noted  . Sinus tachycardia   . Hypocalcemia   . Spastic hemiparesis ([place])   . SIRS (systemic inflammatory response syndrome) ([facility])   . Acute on chronic anemia   . Hx of ischemic multifocal multiple vascular territories stroke 07/10/2019  . Dysphagia due to recent cerebrovascular accident 07/10/2019  . Fever 07/10/2019  . Right middle cerebral artery stroke ([place]) 07/10/2019  . Acute right arterial ischemic stroke, middle cerebral artery (MCA) ([place]) s/p mechanical thrombectomy 07/03/2019  . Middle cerebral artery embolism, right 07/03/2019  . CAP (community acquired pneumonia) 06/20/2019  . Stroke ([place])   . Stroke due to embolism ([place]) 06/17/2019  . Encounter for antineoplastic chemotherapy 06/05/2019  . Encounter for antineoplastic immunotherapy 06/05/2019  . Goals of care, counseling/discussion 06/05/2019  . Adenocarcinoma of right lung, stage 4 ([place]) 06/04/2019  . Cough 05/21/2019  . S/P bronchoscopy with biopsy   . DVT (deep venous thrombosis) ([place]) 05/08/2019  . Weight loss 04/28/2019  . Aspiration pneumonia ([place]) 04/10/2019  . GERD without esophagitis 11/14/2015  . Hyperlipidemia 11/14/2015  . Benign prostatic hyperplasia 12/24/2008    Expected Discharge Date: Expected Discharge Date: (2 weeks)  Team Members Present: Physician leading conference: Dr.  Alger Simons Social Worker Present: Lennart Pall, LCSW;Jenny Prevatt, LCSW Nurse Present: Dorthula Nettles, RN PT Present: Lavone Nian, PT OT Present: Laverle Hobby, OT SLP Present: Weston Anna, SLP PPS Coordinator present : Gunnar Fusi, SLP     Current Status/Progress Goal Weekly Team Focus  Medical   ongoing spastic left hemiparesis. dysphagia, low grade temp. tolerating d2, nectar  improve activity tolerance  swallowing, pain, nutrition, pulmonary   Bowel/Bladder   Continent and incontinent episodes. LBM 07/27/2019  fewer incontinent episodes  qs/prn toileting, times toileting when awake   Swallow/Nutrition/ Hydration   Dys.2 textures with nectar-thick liquids via tsp, Mod-Max A  Min A  tolerance of liquid upgrade, use of swallowing strategies   ADL's   Total-max A ADLs, Total-max +2 ADL transfers. Some improvement in sitting balance with proper positioning  Mod A ADLs  Following one step commands, postural control, L NMR, bathing/dressing, ADL transfers   Mobility   +2 assist overall, gait with 3 muskateers or light gait, poor midline orientation, impaired cognition, visual spatial deficits  min assist bed mobility, mod assist transfers, max assist gait x 10 ft with therapy, supervision w/c mobility - will likely be downgraded to max assist transfers this week  midline orientation, sitting balance, NMR, cognition, transfers, bed mobility, positioning, OOB tolerance   Communication             Safety/Cognition/ Behavioral Observations  Max-Total A  Min A-goals will need to be downgraded to Mod A  attention, initition, awareness, scanning to midline   Pain   denies pain  remain pain free  assess and provide pain meds, reposition  prn   Skin   ecchymosis to arms resolving  maintain skin integrity  qs/prn skin assessments    Rehab Goals Patient on target to meet rehab goals: No Rehab Goals Revised: anticipate need to downgrade goals. *See Care Plan and progress notes for  long and short-term goals.     Barriers to Discharge  Current Status/Progress Possible Resolutions Date Resolved   Physician    Medical stability        see medical progress notes      Nursing                  PT  Inaccessible home environment;Nutrition means;Pending chemo/radiation;Incontinence                 OT Medical stability;Incontinence;Lack of/limited family support  Pt is not progressing, burden of care may be too high for family to meet             SLP                SW                Discharge Planning/Teaching Needs:  Plan has been for pt to d/c home with wife as primary caregiver and intermittent assistance of adult children, however, significant concerns that pt's LOC is too great for wife.  Will follow up with wife to discuss further.  Teaching will need to continue addition of adaptive DME (i.e. hoyer) to allow care at home if wife confirms plan for home d/c vs SNF.   Team Discussion:  Noted low grade temp;  Tachy at times;  Lethagic;  Cont/ incont.  Total  - Max assist overall.  Back pain has been very limiting.  Increased coughing episodes a concern of therapy.  Team concerned about pt's overall care needs and need family to return to discuss.  May consider Palliative team consult. Anticipate downgrading of many goals to max assist and likely introducing hoyer lift.  Revisions to Treatment Plan:  Anticipate downgrading of overall goals;  Possible referral to Palliative team    Continued Need for Acute Rehabilitation Level of Care: The patient requires daily medical management by a physician with specialized training in physical medicine and rehabilitation for the following conditions: Daily direction of a multidisciplinary physical rehabilitation program to ensure safe treatment while eliciting the highest outcome that is of practical value to the patient.: Yes Daily medical management of patient stability for increased activity during participation in an intensive  rehabilitation regime.: Yes Daily analysis of laboratory values and/or radiology reports with any subsequent need for medication adjustment of medical intervention for : Post surgical problems;Wound care problems   I attest that I was present, lead the team conference, and concur with the assessment and plan of the team.   Lennart Pall 07/28/2019, 4:52 PM    Team conference was held via web/ teleconference due to [place] - 19

## 2019-07-28 NOTE — Progress Notes (Addendum)
Physical Therapy Session Note  Patient Details  Name: Kyle Garcia MRN: [id number] Date of Birth: 28-Jan-1951  Today's Date: 07/28/2019 PT Individual Time: 0800-0900 PT Individual Time Calculation (min): 60 min   Short Term Goals: Week 1:  PT Short Term Goal 1 (Week 1): Pt will maintain sitting balance with CGA PT Short Term Goal 1 - Progress (Week 1): Not met PT Short Term Goal 2 (Week 1): Pt will perform bed<>chair transfer with max assist +1 PT Short Term Goal 2 - Progress (Week 1): Not met PT Short Term Goal 3 (Week 1): Pt will perform sit<>stand with max assist +2 PT Short Term Goal 3 - Progress (Week 1): Met Week 2:  PT Short Term Goal 1 (Week 2): Pt will perform bed mobility with max assist +1. PT Short Term Goal 1 - Progress (Week 2): Not met PT Short Term Goal 2 (Week 2): Pt will complete bed<>w/c with max assist +1. PT Short Term Goal 2 - Progress (Week 2): Not met PT Short Term Goal 3 (Week 2): Pt will maintain static sitting balance with mod assist +1. PT Short Term Goal 3 - Progress (Week 2): Progressing toward goal Week 3:  PT Short Term Goal 1 (Week 3): Pt will perform bed mobility with max assist +1. PT Short Term Goal 2 (Week 3): Pt will complete bed<>w/c with max assist +1. PT Short Term Goal 3 (Week 3): Pt will consistently demonstrate static sitting balance with mod assist +1.  Skilled Therapeutic Interventions/Progress Updates:    AM Session: Pain:  Pt c/o cervical pain w/stretching R shoulder pain w/stretching, R shoulder pain when placed in prone.  unable to quantify, stretching and positioning in prone to patient tolerance.  Therapeutic Activity:  Pt initially supine in bed.  Dependent for donning shoes and socks.  Dependent for rolling in bed.  Total assist supine to side to sit on edge of bed.   Worked on static sitting balance on edge of bed, pt leans post L, pushes w/LUE intermittently.  Pt is able to activate core w/static sitting balance briefly requiring  min assist, varies from min to max assist.  Heavily flexed posture, difficulty extending cervical spine w/downward gaze and poor ability to attend to L.     Pt transported to gym and continued session sitting on edge of mat.   Worked on reaching to R w/return to midline to facilitate midline orientation/sitting balance.  Attempted using cones/horshoes, pt requires hand over hand facilitation to participate majority of time.  Poor attention to task.   Incorporated cervical stretching. LUE stretching, and L trunk rotation ROM into sitting activity.  Also worked on ROM stretching in supine.  Initially w/legs off of mat for hip flexor stretching, not well tolerated. Repositioned w/feet on mat and continued w/trunk/shoulder/cervial/ Lue stretching.  Very limited L shoulder ER.  Rolling L and R on mat requires max tactile/verbal cues to initiate, min to mod assist to complete.  Max assist of 2 to transition from R sidelying to prone which was maintained approx 1 min for stretching, but poorly tolerated by pt due to discomfort.  Sit to stand from edge of mat w/max assist of 2 repeated x 2, stood briefly w/max assist of 2. wc to mat squat pivot dependent, mat to wc stand pivot max assist of 2.  Pt w/unproductive wet  Cough following session, nursing aware.  Pt left OOB in wc in tilted position for safety, seatbelt on.   Assessment:  Pt requires max to  dependent level of assist w/all mobility due to inattention, cognitive deficits, L UE/LE hemiparesis and increased tone, poor balance. Anticipate pt will need 24 hr assistance/high level of care at dc.    Therapy Documentation Precautions:  Precautions Precautions: Fall Precaution Comments: Dense Lt hemi, pusher Restrictions Weight Bearing Restrictions: No    Therapy/Group: Individual Therapy Callie Fielding, PT   07/28/2019, 11:25 AM

## 2019-07-28 NOTE — Telephone Encounter (Signed)
Wife said to remove the pt mobile number from the chart and call her mobile for anything related to pt. Done

## 2019-07-28 NOTE — Progress Notes (Signed)
[facility] PHYSICAL MEDICINE & REHABILITATION PROGRESS NOTE   Subjective/Complaints: Feeding with NT. Denies new pain. Has appetite.   ROS: Limited due to cognitive/behavioral    Objective:   No results found. No results for input(s): WBC, HGB, HCT, PLT in the last 72 hours. Recent Labs    07/26/19 0726  NA 134*  K 3.6  CL 101  CO2 25  GLUCOSE 117*  BUN 16  CREATININE 0.68  CALCIUM 8.1*    Intake/Output Summary (Last 24 hours) at 07/28/2019 1048 Last data filed at 07/28/2019 0902 Gross per 24 hour  Intake 240 ml  Output -  Net 240 ml     Physical Exam: Vital Signs Blood pressure 104/61, pulse (!) 103, temperature 98 F (36.7 C), temperature source Oral, resp. rate 20, height 5\' 11"  (1.803 m), weight 59.9 kg, SpO2 96 %. Constitutional: No distress . Vital signs reviewed. HEENT: EOMI, oral membranes moist Neck: supple Cardiovascular: RRR without murmur. No JVD    Respiratory: CTA Bilaterally without wheezes or rales. Normal effort    GI: BS +, non-tender, non-distended  Musc: No edema or tenderness in extremities. Neurologic: Alert. Delayed movement of bolus of food in mouth. Eventually clears. Sometimes with cough too but clears ultimately.  Motor: RUE/RLE: Grossly 5/5  LUE/LLE: 0/5 Left neglect, left upper and lower ext tone 2/4, DTR's 3+ Increased tone noted Psych: matter of fact Assessment/Plan: 1. Functional deficits secondary to Right MCA infarct with dysphagia  which require 3+ hours per day of interdisciplinary therapy in a comprehensive inpatient rehab setting.  Physiatrist is providing close team supervision and 24 hour management of active medical problems listed below.  Physiatrist and rehab team continue to assess barriers to discharge/monitor patient progress toward functional and medical goals  Care Tool:  Bathing    Body parts bathed by patient: Chest, Right upper leg, Face   Body parts bathed by helper: Right arm, Left arm, Abdomen, Front  perineal area, Buttocks, Left upper leg, Right lower leg, Left lower leg     Bathing assist Assist Level: Total Assistance - Patient < 25%     Upper Body Dressing/Undressing Upper body dressing   What is the patient wearing?: Pull over shirt    Upper body assist Assist Level: Total Assistance - Patient < 25%    Lower Body Dressing/Undressing Lower body dressing      What is the patient wearing?: Pants, Incontinence brief     Lower body assist Assist for lower body dressing: Dependent - Patient 0%     Toileting Toileting    Toileting assist Assist for toileting: Dependent - Patient 0%     Transfers Chair/bed transfer  Transfers assist     Chair/bed transfer assist level: 2 Helpers     Locomotion Ambulation   Ambulation assist   Ambulation activity did not occur: Safety/medical concerns  Assist level: 2 helpers Assistive device: No Device Max distance: 10   Walk 10 feet activity   Assist  Walk 10 feet activity did not occur: Safety/medical concerns  Assist level: 2 helpers Assistive device: No Device   Walk 50 feet activity   Assist Walk 50 feet with 2 turns activity did not occur: Safety/medical concerns         Walk 150 feet activity   Assist Walk 150 feet activity did not occur: Safety/medical concerns         Walk 10 feet on uneven surface  activity   Assist Walk 10 feet on uneven surfaces activity  did not occur: Safety/medical concerns         Wheelchair     Assist Will patient use wheelchair at discharge?: Yes Type of Wheelchair: Manual    Wheelchair assist level: Dependent - Patient 0%(TIS w/c) Max wheelchair distance: 150 ft    Wheelchair 50 feet with 2 turns activity    Assist        Assist Level: Dependent - Patient 0%   Wheelchair 150 feet activity     Assist     Assist Level: Dependent - Patient 0%    Medical Problem List and Plan: 1.Dense lefthemiparesis, now with spasticitywith  dysphagiasecondary to embolic shower right MCA occlusion status post reperfusion. Infarct felt to be embolic secondary to hypercoagulable state from lung cancer  Continue CIR  -PRAFO, WHO Left side  -team conf today 2. Antithrombotics: -DVT/anticoagulation/history of right lower extremity HWE:[id number] -antiplatelet therapy: N/A 3. Pain Management/spastic left hemiparesis:Tylenol as needed  -antispasmodics   -can't tolerate more than 5mg  BID   -orthotics 4. Mood:Provide emotional support -antipsychotic agents: N/A  -resume ritalin at 5mg  bid (was tachycardic prior to ritalin)  5. Neuropsych: This patientiscapable of making decisions on hisown behalf. 6. Skin/Wound Care:Routine skin checks 7. Fluids/Electrolytes/Nutrition:   -dc IVF   -BUN/Cr stable on 8/2 8.Stage IV adenocarcinoma right lung. Chemotherapy as per [name]  -onc appts on temporary hold until after rehab discharge 9. Aspiration pneumonia, low grade temp.   Unasyn completed  WBCs 8.2 on 8/1 Aspiration precautions  CXR with persistent opacities, afebrile  -ucx only 10k  -blood cx pending still 10.  Post stroke dysphagia.   D2 nectars  11. Hyperlipidemia. Crestor 12. Remote tobacco abuse. Counseling 13.Acute on chronic anemia.   Hemoglobin 8.6 on 8/1  Continue to monitor  14 Hypocalcemia-stable at 8.4 on 7/30  -likely related to #8 15.     Tachycardia: has had since admit  ECG   showing sinus tachycardia  Coreg will  increase coreg to 6.5 BP permitting   LOS: 18 days A FACE TO FACE EVALUATION WAS PERFORMED  [name] 07/28/2019, 10:48 AM

## 2019-07-28 NOTE — Progress Notes (Signed)
Speech Language Pathology Daily Session Note  Patient Details  Name: Kyle Garcia MRN: [id number] Date of Birth: [birth year]  Today's Date: 07/28/2019 SLP Individual Time: 1310-1400 SLP Individual Time Calculation (min): 50 min  Short Term Goals: Week 3: SLP Short Term Goal 1 (Week 3): Patient will consume dysphagia 2 diet with nectar thick liquids via spoon to achieve minimal overt s/s of aspiration and Min verbal cues for use of swallowing compensatory strategies. SLP Short Term Goal 2 (Week 3): Pt will consume nectar thick liquids via cup with minimal overt s/s of aspiration across 3 sessions to demonstrate readiness for consumption via cup. SLP Short Term Goal 3 (Week 3): Patient will demonstrate sustained attention to functional tasks for 5 minutes with Max A verbal cues for redirection. SLP Short Term Goal 4 (Week 3): Given Mod A cues, pt will follow 1 step basic directions in 5 out of 10 opportunities. SLP Short Term Goal 5 (Week 3): Patient will initiate functional tasks in 5 out of 10 opportunities with Mod A multimodal cues SLP Short Term Goal 6 (Week 3): Patient will scan to midline to locate functional items in 5/10 opportunities with Mod A cues.  Skilled Therapeutic Interventions Skilled ST services focused on swallow and cognitive skills. Nurse was presents and asked about conflicting documentation in room for thin texture, reporting providing HTL this am via spoons with no overt s/s aspiration. SLP facilitated current recommended diet of NTL via spoon, pt produced explosive initial cough, likely protective cough and only noted delayed TC in 6 out 10 trials, however delayed throat clear appeared not due to PO intake. SLP recommends continuing current diet on NTL via spoon and updated documentation posted in room. SLP also facilitated task initiation and scanning to left of midline in colored card task, pt unable to locate requested color card left of midline in a field of 3, however able  to locate object left of midline in a field of 2 with mod A verbal cues. Pt was fidgeting with safety belt and moving RLE up/down majority of session. SLP repositioned pt several times in tilt chair, however fidgeting continued. Pt demonstrated language of confusion and disorientated to place initially [facility]", however when questioned again named correct hospital. Pt was left in room with call bell within reach and chair alarm set. ST recommends to continue skilled ST services.      Pain Pain Assessment Pain Score: 0-No pain  Therapy/Group: Individual Therapy  Johann Santone  [facility] 07/28/2019, 4:01 PM

## 2019-07-29 ENCOUNTER — Inpatient Hospital Stay (HOSPITAL_COMMUNITY): Payer: Medicare Other | Admitting: Physical Therapy

## 2019-07-29 ENCOUNTER — Inpatient Hospital Stay (HOSPITAL_COMMUNITY): Payer: Medicare Other

## 2019-07-29 ENCOUNTER — Inpatient Hospital Stay: Payer: Medicare Other

## 2019-07-29 ENCOUNTER — Inpatient Hospital Stay (HOSPITAL_COMMUNITY): Payer: Medicare Other | Admitting: Speech Pathology

## 2019-07-29 ENCOUNTER — Other Ambulatory Visit: Payer: Medicare Other

## 2019-07-29 ENCOUNTER — Inpatient Hospital Stay (HOSPITAL_COMMUNITY): Payer: Medicare Other | Admitting: Occupational Therapy

## 2019-07-29 ENCOUNTER — Inpatient Hospital Stay: Payer: Medicare Other | Admitting: Physician Assistant

## 2019-07-29 ENCOUNTER — Ambulatory Visit: Payer: Medicare Other | Admitting: Physician Assistant

## 2019-07-29 LAB — URINALYSIS, ROUTINE W REFLEX MICROSCOPIC
Bilirubin Urine: NEGATIVE
Glucose, UA: NEGATIVE mg/dL
Hgb urine dipstick: NEGATIVE
Ketones, ur: NEGATIVE mg/dL
Leukocytes,Ua: NEGATIVE
Nitrite: NEGATIVE
Protein, ur: NEGATIVE mg/dL
Specific Gravity, Urine: 1.019 (ref 1.005–1.030)
pH: 6 (ref 5.0–8.0)

## 2019-07-29 LAB — BASIC METABOLIC PANEL
Anion gap: 10 (ref 5–15)
BUN: 21 mg/dL (ref 8–23)
CO2: 25 mmol/L (ref 22–32)
Calcium: 8.5 mg/dL — ABNORMAL LOW (ref 8.9–10.3)
Chloride: 101 mmol/L (ref 98–111)
Creatinine, Ser: 0.73 mg/dL (ref 0.61–1.24)
GFR calc Af Amer: >60 mL/min (ref >60)
GFR calc non Af Amer: >60 mL/min (ref >60)
Glucose, Bld: 103 mg/dL — ABNORMAL HIGH (ref 70–99)
Potassium: 3.9 mmol/L (ref 3.5–5.1)
Sodium: 136 mmol/L (ref 135–145)

## 2019-07-29 LAB — CBC
HCT: 29.6 % — ABNORMAL LOW (ref 39.0–52.0)
Hemoglobin: 9.1 g/dL — ABNORMAL LOW (ref 13.0–17.0)
MCH: 26.3 pg (ref 26.0–34.0)
MCHC: 30.7 g/dL (ref 30.0–36.0)
MCV: 85.5 fL (ref 80.0–100.0)
Platelets: 156 10*3/uL (ref 150–400)
RBC: 3.46 MIL/uL — ABNORMAL LOW (ref 4.22–5.81)
RDW: 15.5 % (ref 11.5–15.5)
WBC: 9 10*3/uL (ref 4.0–10.5)
nRBC: 0 % (ref 0.0–0.2)

## 2019-07-29 MED ORDER — SODIUM CHLORIDE 0.9 % IV BOLUS
500.0000 mL | Freq: Once | INTRAVENOUS | Status: AC
  Administered 2019-07-29: 15:00:00 500 mL via INTRAVENOUS

## 2019-07-29 MED ORDER — SODIUM CHLORIDE 0.9 % IV SOLN
INTRAVENOUS | Status: DC
  Administered 2019-07-29 – 2019-08-10 (×13): via INTRAVENOUS

## 2019-07-29 NOTE — Progress Notes (Addendum)
[place] PHYSICAL MEDICINE & REHABILITATION PROGRESS NOTE   Subjective/Complaints: Patient reports no new problems.  I asked him if he was having back pain this morning and he denied.  ROS: Limited due to cognitive/behavioral     Objective:   No results found. No results for input(s): WBC, HGB, HCT, PLT in the last 72 hours. No results for input(s): NA, K, CL, CO2, GLUCOSE, BUN, CREATININE, CALCIUM in the last 72 hours.  Intake/Output Summary (Last 24 hours) at 07/29/2019 1107 Last data filed at 07/29/2019 0800 Gross per 24 hour  Intake 180 ml  Output -  Net 180 ml     Physical Exam: Vital Signs Blood pressure 110/72, pulse 96, temperature 98.9 F (37.2 C), temperature source Oral, resp. rate 18, height 5\' 11"  (1.803 m), weight 59.9 kg, SpO2 96 %. Constitutional: No distress . Vital signs reviewed. HEENT: EOMI, oral membranes moist Neck: supple Cardiovascular: RRR without murmur. No JVD    Respiratory: CTA Bilaterally without wheezes or rales. Normal effort    GI: BS +, non-tender, non-distended   Musc: No edema or tenderness in extremities. Neurologic: Alert.  Voice strong and clear today.  Motor: RUE/RLE: Grossly 5/5  LUE/LLE: 0/5 Left neglect, left upper and lower ext tone 2/4, DTR's 3+ Ongoing flexor tone noted.  Leg in neutral extension today in bed with brace in place Psych: Flat but cooperative   Assessment/Plan: 1. Functional deficits secondary to Right MCA infarct with dysphagia  which require 3+ hours per day of interdisciplinary therapy in a comprehensive inpatient rehab setting.  Physiatrist is providing close team supervision and 24 hour management of active medical problems listed below.  Physiatrist and rehab team continue to assess barriers to discharge/monitor patient progress toward functional and medical goals  Care Tool:  Bathing    Body parts bathed by patient: Chest, Right upper leg, Face   Body parts bathed by helper: Right arm, Left arm,  Abdomen, Front perineal area, Buttocks, Left upper leg, Right lower leg, Left lower leg     Bathing assist Assist Level: Total Assistance - Patient < 25%     Upper Body Dressing/Undressing Upper body dressing   What is the patient wearing?: Pull over shirt    Upper body assist Assist Level: Total Assistance - Patient < 25%    Lower Body Dressing/Undressing Lower body dressing      What is the patient wearing?: Pants, Incontinence brief     Lower body assist Assist for lower body dressing: 2 Helpers     Toileting Toileting    Toileting assist Assist for toileting: Dependent - Patient 0%     Transfers Chair/bed transfer  Transfers assist     Chair/bed transfer assist level: Total Assistance - Patient < 25%     Locomotion Ambulation   Ambulation assist   Ambulation activity did not occur: Safety/medical concerns  Assist level: 2 helpers Assistive device: No Device Max distance: 10   Walk 10 feet activity   Assist  Walk 10 feet activity did not occur: Safety/medical concerns  Assist level: 2 helpers Assistive device: No Device   Walk 50 feet activity   Assist Walk 50 feet with 2 turns activity did not occur: Safety/medical concerns         Walk 150 feet activity   Assist Walk 150 feet activity did not occur: Safety/medical concerns         Walk 10 feet on uneven surface  activity   Assist Walk 10 feet on uneven  surfaces activity did not occur: Safety/medical concerns         Wheelchair     Assist Will patient use wheelchair at discharge?: Yes Type of Wheelchair: Manual    Wheelchair assist level: Dependent - Patient 0%(TIS w/c) Max wheelchair distance: 150 ft    Wheelchair 50 feet with 2 turns activity    Assist        Assist Level: Dependent - Patient 0%   Wheelchair 150 feet activity     Assist     Assist Level: Dependent - Patient 0%    Medical Problem List and Plan: 1.Dense lefthemiparesis,  now with spasticitywith dysphagiasecondary to embolic shower right MCA occlusion status post reperfusion. Infarct felt to be embolic secondary to hypercoagulable state from lung cancer  Continue CIR  -PRAFO, WHO Left side  -palliative care consult may be appropriate given challenges ahead from neuro/oncological standpoint 2. Antithrombotics: -DVT/anticoagulation/history of right lower extremity IWP:[id number] -antiplatelet therapy: N/A 3. Pain Management/spastic left hemiparesis:Tylenol as needed  -added scheduled tramadol 25mg  bid  -antispasmodics   -can't tolerate more than 5mg  BID   -orthotics  -kpad for back 4. Mood:Provide emotional support -antipsychotic agents: N/A  -resume ritalin at 5mg  bid (was tachycardic prior to ritalin) 5. Neuropsych: This patientiscapable of making decisions on hisown behalf. 6. Skin/Wound Care:Routine skin checks 7. Fluids/Electrolytes/Nutrition:   -dc IVF   -BUN/Cr stable on 8/2 8.Stage IV adenocarcinoma right lung. Chemotherapy as per [name]  -onc appts on temporary hold until after rehab discharge 9. Aspiration pneumonia, low grade temp.   Unasyn completed  WBCs 8.2 on 8/1 Aspiration precautions  CXR with persistent opacities, afebrile  -ucx only 10k  -blood cx pending still 10.  Post stroke dysphagia.   D2 nectars   -diet upgrade and f/u MBS per SLP 11. Hyperlipidemia. Crestor 12. Remote tobacco abuse. Counseling 13.Acute on chronic anemia.   Hemoglobin 8.6 on 8/1  Continue to monitor  14 Hypocalcemia-stable at 8.4 on 7/30  -likely related to #8 15.     Tachycardia: has had since admit  ECG  with sinus tachycardia  Coreg increased to 6.5mg  with some improvement over last 24 hours.  LOS: 19 days A FACE TO [place] 07/29/2019, 11:07 AM

## 2019-07-29 NOTE — Progress Notes (Signed)
Social Work Patient ID: Kyle Garcia, male   DOB: July 17, 1951, 68 y.o.   MRN: [id number]  Have reviewed team conference with pt's wife.  She is aware of ELS @ 2 weeks. Discussed concerns about level of care he is likely to require at home and probable need for hospital bed, hoyer lift, etc.  Wife reports that one of their daughters is planning to move into the home (along with her 4 children ages 39 - 70 yrs) to assist wife with care.  She has also requested that the other two adult children be allowed to come into hospital to see/ observe the care needs so that they can then have a "family meeting" next week - will coordinate.  I have discussed the option of SNF vs hiring private duty assistance as well.  Continue to follow.  Ilijah Doucet, LCSW

## 2019-07-29 NOTE — Progress Notes (Addendum)
Physical Therapy Session Note  Patient Details  Name: Kyle Garcia MRN: [id number] Date of Birth: 01/31/1951  Today's Date: 07/29/2019 PT Individual Time: 7014-1030 PT Individual Time Calculation (min): 15 min   Short Term Goals: Week 3:  PT Short Term Goal 1 (Week 3): Pt will perform bed mobility with max assist +1. PT Short Term Goal 2 (Week 3): Pt will complete bed<>w/c with max assist +1. PT Short Term Goal 3 (Week 3): Pt will consistently demonstrate static sitting balance with mod assist +1.  Skilled Therapeutic Interventions/Progress Updates:  Pt seen with OT clinical specialist. Pt received in bed, pt not able to focus eyes on therapist or recognize when therapist was speaking to him. Pt requires total assist + 2 for supine<>sitting EOB & total assist for sitting balance EOB. Pt not talking except on one occasion and speech was slurred & non coherent. OT reports observing nystagmus while pt in supine but PT did not see this. Provided dependent assist for suctioning mouth out but pt unable to open/close mouth on command. Pt returned to bed to allow IV team to insert IV & for x-ray to complete scan.   RN Marzetta Board) & PA Jeannene Patella) notified of pt's overall decline.   Therapy Documentation Precautions:  Precautions Precautions: Fall Precaution Comments: Dense Lt hemi, pusher Restrictions Weight Bearing Restrictions: No General: PT Amount of Missed Time (min): 45 Minutes PT Missed Treatment Reason: Patient fatigue    Therapy/Group: Individual Therapy  [name] 07/29/2019, 3:05 PM

## 2019-07-29 NOTE — Progress Notes (Signed)
Occupational Therapy Session Note  Patient Details  Name: Kyle Garcia MRN: [id number] Date of Birth: [birth year]  Today's Date: 07/29/2019 OT Individual Time: 7737-3668 OT Individual Time Calculation (min): 41 min    Short Term Goals: Week 3:  OT Short Term Goal 1 (Week 3): Pt will initiate bathing one body part with mod vc OT Short Term Goal 2 (Week 3): Pt will attend to one grooming task with mod vc OT Short Term Goal 3 (Week 3): Pt will don shirt with max A  Skilled Therapeutic Interventions/Progress Updates:    Upon entering the room, pt supine in bed and agreeable to OT intervention. Pt having visual hallucinations and talking to family members not present in the room. OT checking brief and pt soiled with BM and unaware. Pt required +2 assistance for rolling and hygiene. Pt with significant pain when rolling to the L this session. Once cleaned, pt began having another BM and placed onto bed pan with total A. OT provided hygiene and again and provided total A to don LB clothing. Pt remained in bed with bed alarm activated and call bell within reach.   Therapy Documentation Precautions:  Precautions Precautions: Fall Precaution Comments: Dense Lt hemi, pusher Restrictions Weight Bearing Restrictions: No General:   Vital Signs: Therapy Vitals Temp: 98.9 F (37.2 C) Temp Source: Oral Pulse Rate: 96 Resp: 18 BP: 110/72 Patient Position (if appropriate): Lying Oxygen Therapy SpO2: 96 % O2 Device: Room Air ADL: ADL Eating: Not assessed Grooming: Dependent(washing hands) Where Assessed-Grooming: Bed level Upper Body Bathing: Dependent Where Assessed-Upper Body Bathing: Bed level Lower Body Bathing: Dependent Where Assessed-Lower Body Bathing: Bed level Upper Body Dressing: Dependent Where Assessed-Upper Body Dressing: Bed level Lower Body Dressing: Dependent Where Assessed-Lower Body Dressing: Bed level Toileting: Dependent Where Assessed-Toileting: Bed level Toilet  Transfer: Not assessed Tub/Shower Transfer: Not assessed   Therapy/Group: Individual Therapy  Gypsy Decant 07/29/2019, 8:57 AM

## 2019-07-29 NOTE — Progress Notes (Signed)
Speech Language Pathology Daily Session Note  Patient Details  Name: Kyle Garcia MRN: [id number] Date of Birth: [birth year]  Today's Date: 07/29/2019 SLP Individual Time: 0900-1000 SLP Individual Time Calculation (min): 60 min  Short Term Goals: Week 3: SLP Short Term Goal 1 (Week 3): Patient will consume dysphagia 2 diet with nectar thick liquids via spoon to achieve minimal overt s/s of aspiration and Min verbal cues for use of swallowing compensatory strategies. SLP Short Term Goal 2 (Week 3): Pt will consume nectar thick liquids via cup with minimal overt s/s of aspiration across 3 sessions to demonstrate readiness for consumption via cup. SLP Short Term Goal 3 (Week 3): Patient will demonstrate sustained attention to functional tasks for 5 minutes with Max A verbal cues for redirection. SLP Short Term Goal 4 (Week 3): Given Mod A cues, pt will follow 1 step basic directions in 5 out of 10 opportunities. SLP Short Term Goal 5 (Week 3): Patient will initiate functional tasks in 5 out of 10 opportunities with Mod A multimodal cues SLP Short Term Goal 6 (Week 3): Patient will scan to midline to locate functional items in 5/10 opportunities with Mod A cues.  Skilled Therapeutic Interventions:  Skilled treatment session focused on cognition and dysphagia goals. SLP received pt as nursing was administering morning medicines. Pt free of overt s/s of aspiration. Nursing stated that pt had initial cough on previous date during first swallow of nectar thick liquids but was symptom free afterward initial swallow. SLP faciltiated session by providing nectar by tsp, nectar by cup with Total A for hand over hand self-feeding and via straw. Pt free of overt s/s of aspiration when consuming. Functionally, pt is better able to consume via tsp or via straw than via cup as it is difficult to provide consistantly sized bolus with hand over hand presentation. SLP further facilitated session by having pt label  numbers for verfication that pt able to see and understand printed number. SLP then presented pt with 2 numbers and requested pt to hand specified number. Pt unable to complete task despite Total A multimodal cues. At end of session, SLP provided skilled observation of pt consuming ice chips, thin liquids via tsp and thin liquids via straw. Pt free of overt s/s of aspiration with ice chips and thin liquids via tsp. Pt with immediate cough on thin liquids via straw that appeared directly related to swallow. Of note, pt with sensed aspiration of thin liquids on MBS (07/08/19). Would recommend trials of ice chips at bedside to maximize use of swallow function and also recommend follow up MBS to assess ability to protect airway with thin liquids. Pt left upright in bed, bed alarm on and all needs within reach. Continue per current plan of care.        Pain    Therapy/Group: Individual Therapy  Makisha Marrin 07/29/2019, 1:45 PM

## 2019-07-29 NOTE — Progress Notes (Signed)
Occupational Therapy Session Note  Patient Details  Name: Kyle Garcia MRN: [id number] Date of Birth: 02-25-1951  Today's Date: 07/29/2019 OT Individual Time: 1100-1145 OT Individual Time Calculation (min): 45 min    Short Term Goals: Week 1:  OT Short Term Goal 1 (Week 1): Pt will complete 1 grooming task EOB with 1 assist for sitting balance OT Short Term Goal 1 - Progress (Week 1): Progressing toward goal OT Short Term Goal 2 (Week 1): Pt will initiate washing affected arm with max vcs OT Short Term Goal 2 - Progress (Week 1): Progressing toward goal OT Short Term Goal 3 (Week 1): Pt will exhibit improved sustained attention by initiating placing 1 limb into UB garment OT Short Term Goal 3 - Progress (Week 1): Not met  Skilled Therapeutic Interventions/Progress Updates:    1:1. Pt received in bed agreeable to shower. Pt demo increased confusion, tone, slurring of words and decreased intelligibility/confusion of speech this date rogressively throughout session. RN alerted. Pt requires total A of 2 for bed mobility, transfers, bathing, and dressing. Pt with more trunk flexion forward this date when seated in shower chair despite VC for leaning back. Pt actively fighting all HOH A to assist pt to wash body. After shower pt returned to bed for dressing total A of 2 and BP assessed (see flow chart). RN alerted to concerns and pt sound asleep by end of dressing. Pt reporting pain in back throughout session with no relief. Exited session with pt missing 15 min d/t fatigue.  Therapy Documentation Precautions:  Precautions Precautions: Fall Precaution Comments: Dense Lt hemi, pusher Restrictions Weight Bearing Restrictions: No General:   Vital Signs: Therapy Vitals Pulse Rate: (!) 101 Resp: 17 BP: 102/63 Patient Position (if appropriate): Lying Oxygen Therapy SpO2: 96 % O2 Device: Room Air Pain: Pain Assessment Pain Scale: 0-10 Pain Score: 0-No pain Pain Type: Chronic pain Pain  Location: Back Pain Orientation: Lower Pain Descriptors / Indicators: Aching;Constant;Discomfort Pain Frequency: Constant Pain Onset: On-going Patients Stated Pain Goal: 0 Pain Intervention(s): Medication (See eMAR) ADL: ADL Eating: Not assessed Grooming: Dependent(washing hands) Where Assessed-Grooming: Bed level Upper Body Bathing: Dependent Where Assessed-Upper Body Bathing: Bed level Lower Body Bathing: Dependent Where Assessed-Lower Body Bathing: Bed level Upper Body Dressing: Dependent Where Assessed-Upper Body Dressing: Bed level Lower Body Dressing: Dependent Where Assessed-Lower Body Dressing: Bed level Toileting: Dependent Where Assessed-Toileting: Bed level Toilet Transfer: Not assessed Tub/Shower Transfer: Not assessed Vision   Perception    Praxis   Exercises:   Other Treatments:     Therapy/Group: Individual Therapy  [name] 07/29/2019, 11:55 AM

## 2019-07-30 ENCOUNTER — Inpatient Hospital Stay (HOSPITAL_COMMUNITY): Payer: Medicare Other

## 2019-07-30 ENCOUNTER — Inpatient Hospital Stay (HOSPITAL_COMMUNITY): Payer: Medicare Other | Admitting: Occupational Therapy

## 2019-07-30 ENCOUNTER — Inpatient Hospital Stay (HOSPITAL_COMMUNITY): Payer: Medicare Other | Admitting: Speech Pathology

## 2019-07-30 DIAGNOSIS — G8110 Spastic hemiplegia affecting unspecified side: Secondary | ICD-10-CM

## 2019-07-30 DIAGNOSIS — I9589 Other hypotension: Secondary | ICD-10-CM

## 2019-07-30 DIAGNOSIS — E861 Hypovolemia: Secondary | ICD-10-CM

## 2019-07-30 DIAGNOSIS — R000 Tachycardia, unspecified: Secondary | ICD-10-CM

## 2019-07-30 LAB — BASIC METABOLIC PANEL
Anion gap: 12 (ref 5–15)
BUN: 18 mg/dL (ref 8–23)
CO2: 20 mmol/L — ABNORMAL LOW (ref 22–32)
Calcium: 7.9 mg/dL — ABNORMAL LOW (ref 8.9–10.3)
Chloride: 102 mmol/L (ref 98–111)
Creatinine, Ser: 0.73 mg/dL (ref 0.61–1.24)
GFR calc Af Amer: >60 mL/min (ref >60)
GFR calc non Af Amer: >60 mL/min (ref >60)
Glucose, Bld: 97 mg/dL (ref 70–99)
Potassium: 4.2 mmol/L (ref 3.5–5.1)
Sodium: 134 mmol/L — ABNORMAL LOW (ref 135–145)

## 2019-07-30 LAB — URINE CULTURE: Culture: NO GROWTH

## 2019-07-30 LAB — CULTURE, BLOOD (ROUTINE X 2)
Culture: NO GROWTH
Culture: NO GROWTH

## 2019-07-30 NOTE — Progress Notes (Signed)
Occupational Therapy Session Note  Patient Details  Name: [name] MRN: [id number] Date of Birth: 08-29-1951  Today's Date: 07/30/2019 OT Individual Time: 0160-1093 OT Individual Time Calculation (min): 55 min    Short Term Goals: Week 1:  OT Short Term Goal 1 (Week 1): Pt will complete 1 grooming task EOB with 1 assist for sitting balance OT Short Term Goal 1 - Progress (Week 1): Progressing toward goal OT Short Term Goal 2 (Week 1): Pt will initiate washing affected arm with max vcs OT Short Term Goal 2 - Progress (Week 1): Progressing toward goal OT Short Term Goal 3 (Week 1): Pt will exhibit improved sustained attention by initiating placing 1 limb into UB garment OT Short Term Goal 3 - Progress (Week 1): Not met  Skilled Therapeutic Interventions/Progress Updates:    1:1. Pt received in bed and rolls B without yelling in pain with MAX A to check brief and don pants. Pt squat pivot transfer with +2 total A and unable to maintain RLE on floor despite VC EOB<>TIS<>EOM. Pt lies back on towel roll on foam wedge with MIN A to maintain extension back and light pressure to expand collar bones across towel roll to stretch pecs. Attempt reaching task seated EOM with MOD-TOTAL A for sitting balance, however pt unable to visually attend to any object/person. Pt transferred back to bed for OT to apply PROM for 1 min per motion on LLE and LUE to decrease risk of contracture and manage tone (45* abd/flex of shoulder). Exited session   Therapy Documentation Precautions:  Precautions Precautions: Fall Precaution Comments: Dense Lt hemi, pusher Restrictions Weight Bearing Restrictions: No General:   Therapy/Group: Individual Therapy  [name] 07/30/2019, 3:02 PM

## 2019-07-30 NOTE — Progress Notes (Signed)
Speech Language Pathology Weekly Progress and Session Note  Patient Details  Name: Kyle Garcia MRN: [id number] Date of Birth: 03-Apr-1951  Beginning of progress report period: July 24, 2019 End of progress report period: July 30, 2019  Today's Date: 07/30/2019 SLP Individual Time: 3734-2876 SLP Individual Time Calculation (min): 12 min  Short Term Goals: Week 3: SLP Short Term Goal 1 (Week 3): Patient will consume dysphagia 2 diet with nectar thick liquids via spoon to achieve minimal overt s/s of aspiration and Min verbal cues for use of swallowing compensatory strategies. SLP Short Term Goal 1 - Progress (Week 3): Not met SLP Short Term Goal 2 (Week 3): Pt will consume nectar thick liquids via cup with minimal overt s/s of aspiration across 3 sessions to demonstrate readiness for consumption via cup. SLP Short Term Goal 2 - Progress (Week 3): Not met SLP Short Term Goal 3 (Week 3): Patient will demonstrate sustained attention to functional tasks for 5 minutes with Max A verbal cues for redirection. SLP Short Term Goal 3 - Progress (Week 3): Not met SLP Short Term Goal 4 (Week 3): Given Mod A cues, pt will follow 1 step basic directions in 5 out of 10 opportunities. SLP Short Term Goal 4 - Progress (Week 3): Not met SLP Short Term Goal 5 (Week 3): Patient will initiate functional tasks in 5 out of 10 opportunities with Mod A multimodal cues SLP Short Term Goal 5 - Progress (Week 3): Not met SLP Short Term Goal 6 (Week 3): Patient will scan to midline to locate functional items in 5/10 opportunities with Mod A cues. SLP Short Term Goal 6 - Progress (Week 3): Discontinued (comment)(Not appropriate at this time)    New Short Term Goals: Week 4: SLP Short Term Goal 1 (Week 4): Given Total A cues, pt will consume current diet with minimal overt s/s of aspiration. SLP Short Term Goal 2 (Week 4): Patient will demonstrate sustained attention to functional tasks for 5 minutes with Max A verbal  cues for redirection. SLP Short Term Goal 3 (Week 4): Given Max A cues, pt will follow 1 step basic directions in 5 out of 10 opportunities. SLP Short Term Goal 4 (Week 4): Given Max A cues, patient will initiate functional tasks in 5 out of 10 opportunities.  Weekly Progress Updates:  Despite pt's efforts, he has not met any of his STGs this reporting period. In fact, he is largely dependent on staff for all needs. Goals have been adjusted and diet downgraded to dysphagia 1 with honey thick liquids via spoon. Pt is at higher risk of aspiration and has not been able to retain information. Prognosis is guarded.    Daily Session  Skilled Therapeutic Interventions:   SLP facilitated cognition by providing Total A multimodal cues for positioning and following 1 step basic directions. Despite this level of assistance and extensive time spent with use of external aids to facilitate pt performance, pt was not able to perform any commands and was frequently distracted by his own intrusive thoughts.   MBS completed, see report.   At bedside, pt is dependent on caregiver cues for oral clearing, to cease talking while consuming PO intake. During this session, pt with extensive oral residue and coughing throughout consumption of dysphagia 2 breakfast with nectar thick liquid via spoon. Also, pt's voice was wet prior to intake and coughing is frequently heard at rest. After information from [facility], it is very likely that pt is not safely managing his own  salvia. given this, oral care needs to be an essential part of pt's care plan to reduce bacterial load. Given severity of overt s/s, recommend downgrading diet to dysphagia 1 with honey thick liquids via spoon. Education provided and information posted throughout room.     General    Pain    Therapy/Group: Individual Therapy  Jarquis Walker 07/30/2019, 12:06 PM

## 2019-07-30 NOTE — Progress Notes (Signed)
Palliative:  Consult received and chart reviewed. Reached out to patient's wife, Kyle Garcia. She would like to schedule a telephone conference to include her children - contact information provided and she will call back with a time for Mildred conference.  Juel Burrow, DNP, AGNP-C Palliative Medicine Team Team Phone # [phone]  Pager # [phone]  NO CHARGE

## 2019-07-30 NOTE — Plan of Care (Signed)
  Problem: RH Swallowing Goal: LTG Patient will consume least restrictive diet using compensatory strategies with assistance (SLP) Description: LTG:  Patient will consume least restrictive diet using compensatory strategies with assistance (SLP) Flowsheets (Taken 07/30/2019 1357) LTG: Pt Patient will consume least restrictive diet using compensatory strategies with assistance of (SLP): Total Assistance - Patient < 25% Note: Downgraded d/t overall decline Goal: LTG Patient will participate in dysphagia therapy to increase swallow function with assistance (SLP) Description: LTG:  Patient will participate in dysphagia therapy to increase swallow function with assistance (SLP) Flowsheets (Taken 07/30/2019 1357) LTG: Pt will participate in dysphagia therapy to increase swallow function with assistance of (SLP): Total Assistance - Patient < 25% Note: Downgraded d/t overall decline   Problem: RH Cognition - SLP Goal: RH LTG Patient will demonstrate orientation with cues Description:  LTG:  Patient will demonstrate orientation to person/place/time/situation with cues (SLP)   Flowsheets (Taken 07/30/2019 1357) LTG: Patient will demonstrate orientation using cueing (SLP): Total Assistance - Patient < 25% Note: Downgraded d/t overall decline   Problem: RH Expression Communication Goal: LTG Patient will increase speech intelligibility (SLP) Description: LTG: Patient will increase speech intelligibility at word/phrase/conversation level with cues, % of the time (SLP) Flowsheets (Taken 07/30/2019 1357) LTG: Patient will increase speech intelligibility (SLP): Maximal Assistance - Patient 25 - 49% Level: Phrase Percent of time patient will use intelligible speech: >90%   Problem: RH Problem Solving Goal: LTG Patient will demonstrate problem solving for (SLP) Description: LTG:  Patient will demonstrate problem solving for basic/complex daily situations with cues  (SLP) Flowsheets (Taken 07/30/2019 1357) LTG  Patient will demonstrate problem solving for: Total Assistance - Patient < 25% Note: Downgraded d/t overall decline   Problem: RH Memory Goal: LTG Patient will demonstrate ability for day to day (SLP) Description: LTG:   Patient will demonstrate ability for day to day recall/carryover during cognitive/linguistic activities with assist  (SLP) Flowsheets (Taken 07/30/2019 1357) LTG: Patient will demonstrate ability for day to day recall/carryover during cognitive/linguistic activities with assist (SLP): Total Assistance - Patient < 25% Note: Downgraded d/t overall decline   Problem: RH Attention Goal: LTG Patient will demonstrate this level of attention during functional activites (SLP) Description: LTG:  Patient will will demonstrate this level of attention during functional activites (SLP) Flowsheets (Taken 07/30/2019 1357) Patient will demonstrate during cognitive/linguistic activities the attention type of: Sustained Patient will demonstrate this level of attention during cognitive/linguistic activities in: Controlled LTG: Patient will demonstrate this level of attention during cognitive/linguistic activities with assistance of (SLP): Total Assistance - Patient < 25% Number of minutes patient will demonstrate attention during cognitive/linguistic activities: 5 minutes Note: Downgraded d/t overall decline   Problem: RH Awareness Goal: LTG: Patient will demonstrate awareness during functional activites type of (SLP) Description: LTG: Patient will demonstrate awareness during functional activites type of (SLP) Flowsheets (Taken 07/30/2019 1357) Patient will demonstrate during cognitive/linguistic activities awareness type of: Intellectual LTG: Patient will demonstrate awareness during cognitive/linguistic activities with assistance of (SLP): Total Assistance - Patient < 25% Note: Downgraded d/t overall decline

## 2019-07-30 NOTE — Progress Notes (Signed)
Modified Barium Swallow Progress Note  Patient Details  Name: Kyle Garcia MRN: [id number] Date of Birth: [birth year]  Today's Date: 07/30/2019  Modified Barium Swallow completed.  Full report located under Chart Review in the Imaging Section.  Brief recommendations include the following:  Clinical Impression  Pt presents with severe oropharyngeal dysphagia that is further compromised by pt's cognitive deficits and physical inability to achieve optimal positioning d/t . Pt's oral phase is c/b decreased lingual manipulation of bolus, ineffective AP transfer/propulsion of bolus which results in increased mastication and premature spillage of boluses. Additionally, pt presents with severe pharyngeal phase deficits related to timing of swallow. Pt presents with extended containment of bolus in vallecula prior to initiating swallow. As a result, any portion of the bolus that escapes the vallecula are aspirated prior to swallow. While pt senses aspirate, his cough is not protective or productive in expelling the aspirate. ASPIRATION IS DEPENDENT ON BOLUS SIZE. Honey thick liquids and nectar thick liquids by spoon were consistently consumed without aspiration. Thin liquids via spoon were occasionally aspirated. When consuming food texture and nectar thick liquid via spoon, pt with aspiration as he didn't swallow food texture prior to consuming liquid which dumped into his airway. Based on this study, pt is appropriate for dypshagia 2 with nectar thick liquids by spoon, however during this study, pt ONLY coughed during moments of aspiration. Therefore, if at bedside, pt is coughing with above mentioned diet, would recommend downgrading diet to conservative diet of puree with honey thick liquids via spoon.   Swallow Evaluation Recommendations       SLP Diet Recommendations: Dysphagia 2 (Fine chop) solids;Nectar thick liquid   Liquid Administration via: Spoon   Medication Administration: Whole meds with  puree   Supervision: Full supervision/cueing for compensatory strategies;Staff to assist with self feeding   Compensations: Small sips/bites;Slow rate;Minimize environmental distractions;Lingual sweep for clearance of pocketing   Postural Changes: Seated upright at 90 degrees   Oral Care Recommendations: Oral care QID   Other Recommendations: Order thickener from pharmacy;Prohibited food (jello, ice cream, thin soups);Have oral suction available    Keven Osborn 07/30/2019,12:42 PM

## 2019-07-30 NOTE — Progress Notes (Signed)
VAST consulted to place PIV. Upon arrival to pt's bedside, spoke with pt's nurse, [place]. She needed to get pt in bed for IV placement and asked if unit RN's were allowed to start PIV's. Educated nurses should be assessing pt's vasculature first and attempting to place PIV before consulting IVT. She verbalized understanding and stated if she was unable to obtain pt's IV she would place new consult.

## 2019-07-30 NOTE — Progress Notes (Signed)
Occupational Therapy Session Note  Patient Details  Name: Kyle Garcia MRN: [id number] Date of Birth: 1951/07/13  Today's Date: 07/30/2019 OT Individual Time: 1683-7290 OT Individual Time Calculation (min): 42 min    Short Term Goals: Week 3:  OT Short Term Goal 1 (Week 3): Pt will initiate bathing one body part with mod vc OT Short Term Goal 2 (Week 3): Pt will attend to one grooming task with mod vc OT Short Term Goal 3 (Week 3): Pt will don shirt with max A  Skilled Therapeutic Interventions/Progress Updates:    Upon entering the room, pt supine in bed and very restless. Pt placing R hand into brief and hand appears soiled. OT providing hand hygiene to B hands with total A. Pt is unaware. Pt with increased pain in L UE with movement this session and pt crying out when therapist takes BP. Pt washing face with set up A and total A needed for hygiene with suction toothbrush. Pt remained in bed at end of session. Call bell and all needed items within reach. Bed alarm activated.   Therapy Documentation Precautions:  Precautions Precautions: Fall Precaution Comments: Dense Lt hemi, pusher Restrictions Weight Bearing Restrictions: No    ADL: ADL Eating: Not assessed Grooming: Dependent(washing hands) Where Assessed-Grooming: Bed level Upper Body Bathing: Dependent Where Assessed-Upper Body Bathing: Bed level Lower Body Bathing: Dependent Where Assessed-Lower Body Bathing: Bed level Upper Body Dressing: Dependent Where Assessed-Upper Body Dressing: Bed level Lower Body Dressing: Dependent Where Assessed-Lower Body Dressing: Bed level Toileting: Dependent Where Assessed-Toileting: Bed level Toilet Transfer: Not assessed Tub/Shower Transfer: Not assessed Vision   Perception    Praxis   Exercises:   Other Treatments:     Therapy/Group: Individual Therapy  Gypsy Decant 07/30/2019, 9:40 AM

## 2019-07-30 NOTE — Progress Notes (Signed)
Physical Therapy Session Note  Patient Details  Name: Kyle Garcia MRN: [id number] Date of Birth: [birth year]  Today's Date: 07/30/2019 PT Individual Time: 1030-1130 PT Individual Time Calculation (min): 60 min   Short Term Goals:  Week 3:  PT Short Term Goal 1 (Week 3): Pt will perform bed mobility with max assist +1. PT Short Term Goal 2 (Week 3): Pt will complete bed<>w/c with max assist +1. PT Short Term Goal 3 (Week 3): Pt will consistently demonstrate static sitting balance with mod assist +1.  Skilled Therapeutic Interventions/Progress Updates:   Pt restlessly moving in bed.  He had pulled or knocked out his IV in R arm.  PT notified [place], LPN  Pt's speech intermittently slurred and non-sensical; at times clear and somewhat appropriate.  Pt verbalized that he had had a stroke yesterday.  Pt rolled L/R with max assist, for  PT to don shorts, in bed with HOB raised.  PT donned bil non slip socks.  Pt sat EOB with intermittent min assist for balance, while Jen, OTR set pt up for grooming tasks.  With hand -over- hand assist , pt wiped R side of face and briefly grasped shaver.    Slide board transfer to L with +2 assist.  In tilt in space w/c , tilted back, pt followed commands to use R hand to reach/release for 5 objects and place them on footboard, total assist.   Pt was agreeable to eating ice cream.  Pt used a spoon (with bright colored tape on handle) to feed himself with mod/max assist.  At end of session, pt sitting in tilt in space w/c with Danise Mina, OT attending to him.     Therapy Documentation Precautions:  Precautions Precautions: Fall Precaution Comments: Dense Lt hemi, pusher Restrictions Weight Bearing Restrictions: No  Pain: painful low back with rolling L and sitting up; not rated.  Premedicated.         Therapy/Group: Individual Therapy  Estill Llerena 07/30/2019, 12:32 PM

## 2019-07-30 NOTE — Plan of Care (Signed)
  Problem: Consults Goal: RH STROKE PATIENT EDUCATION Description: See Patient Education module for education specifics  Outcome: Progressing   Problem: RH BOWEL ELIMINATION Goal: RH STG MANAGE BOWEL WITH ASSISTANCE Description: STG Manage Bowel with MOD I  Assistance. Outcome: Progressing   Problem: RH BLADDER ELIMINATION Goal: RH STG MANAGE BLADDER WITH ASSISTANCE Description: STG Manage Bladder With MIN Assistance Outcome: Progressing   Problem: RH SAFETY Goal: RH STG ADHERE TO SAFETY PRECAUTIONS W/ASSISTANCE/DEVICE Description: STG Adhere to Safety Precautions With MIN Assistance/Device. Outcome: Progressing   Problem: RH PAIN MANAGEMENT Goal: RH STG PAIN MANAGED AT OR BELOW PT'S PAIN GOAL Description: AT OR BELOW LEVEL 4 Outcome: Progressing   Problem: RH KNOWLEDGE DEFICIT Goal: RH STG INCREASE KNOWLEDGE OF HYPERTENSION Description: Pt and wife will state understanding of HTN using medications, dietary restrictions, exercise and using handouts and educational resources independently Outcome: Progressing Goal: RH STG INCREASE KNOWLEDGE OF DYSPHAGIA/FLUID INTAKE Description: Pt and wife will state understanding of dysphagia using handouts and educational resources independently Outcome: Progressing Goal: RH STG INCREASE KNOWLEGDE OF HYPERLIPIDEMIA Description: Pt and wife will state understanding of HLD using medications, dietary restrictions, exercise and using handouts and educational resources independently Outcome: Progressing Goal: RH STG INCREASE KNOWLEDGE OF STROKE PROPHYLAXIS Description: Pt and wife will state understanding of secondary stroke using medications, dietary restrictions, exercise and using handouts and educational resources independently Outcome: Progressing

## 2019-07-30 NOTE — Progress Notes (Addendum)
Kyle Garcia PHYSICAL MEDICINE & REHABILITATION PROGRESS NOTE   Subjective/Complaints: Pt just back from [facility]. States his back feels fine. More confused and lethargic yesterday per team. Work up ordered including CT last night.   ROS: Limited due to cognitive/behavioral   Objective:   Ct Head Wo Contrast  Result Date: 07/29/2019 CLINICAL DATA:  Altered mental status. Recent stroke with thrombectomy 07/03/2019 EXAM: CT HEAD WITHOUT CONTRAST TECHNIQUE: Contiguous axial images were obtained from the base of the skull through the vertex without intravenous contrast. COMPARISON:  MRI head 07/03/2019 FINDINGS: Brain: Hypodensity in the right posterior temporal and parietal lobe compatible with subacute infarct. The exact date of the infarct cannot be accurately determined however this infarct has progressed significantly since the MRI of 07/03/2019. No associated hemorrhage Generalized atrophy. Subacute infarct in the right anterior cerebral artery territory which was present on the prior MRI. Retro cerebellar CSF cyst unchanged. Vascular: Negative for hyperdense vessel Skull: Negative Sinuses/Orbits: Negative Other: None IMPRESSION: Moderately large right MCA infarct appears subacute and has developed since the prior MRI of 07/03/2019. Subacute infarct also in the right anterior cerebral artery territory which was identified on the prior MRI. Negative for acute hemorrhage.  No midline shift. Generalized atrophy.  Retro cerebellar cyst unchanged. Electronically Signed   By: Franchot Gallo M.D.   On: 07/29/2019 18:22   Dg Chest Port 1 View  Result Date: 07/29/2019 CLINICAL DATA:  Confused, altered mental status, concern for aspiration EXAM: PORTABLE CHEST 1 VIEW COMPARISON:  07/25/2019 FINDINGS: No significant change in heterogeneous and consolidative opacity of the right midlung. The left lung is normally aerated. No new airspace opacity. IMPRESSION: No significant change in heterogeneous and consolidative  opacity of the right midlung. The left lung is normally aerated. No new airspace opacity. Electronically Signed   By: Eddie Candle M.D.   On: 07/29/2019 15:23   Recent Labs    07/29/19 1503  WBC 9.0  HGB 9.1*  HCT 29.6*  PLT 156   Recent Labs    07/29/19 1503 07/30/19 0809  NA 136 134*  K 3.9 4.2  CL 101 102  CO2 25 20*  GLUCOSE 103* 97  BUN 21 18  CREATININE 0.73 0.73  CALCIUM 8.5* 7.9*    Intake/Output Summary (Last 24 hours) at 07/30/2019 1002 Last data filed at 07/30/2019 0336 Gross per 24 hour  Intake 1740.62 ml  Output -  Net 1740.62 ml     Physical Exam: Vital Signs Blood pressure 111/76, pulse (!) 113, temperature 98.6 F (37 C), temperature source Oral, resp. rate 14, height 5\' 11"  (1.803 m), weight 59.9 kg, SpO2 98 %. Constitutional: No distress . Vital signs reviewed. HEENT: EOMI, oral membranes moist Neck: supple Cardiovascular: tachy without murmur. No JVD    Respiratory: CTA Bilaterally without wheezes or rales. Normal effort    GI: BS +, non-tender, non-distended  Musc: No edema or tenderness in extremities. Neurologic: Alert but confused. Knew he was in hospital only. Followed simple commands. .  Motor: RUE/RLE: Grossly 5/5  LUE/LLE: 0/5 Left neglect, left upper flexor and lower ext tone 2-3/4, DTR's 3+  Psych: Flat , distracted   Assessment/Plan: 1. Functional deficits secondary to Right MCA infarct with dysphagia  which require 3+ hours per day of interdisciplinary therapy in a comprehensive inpatient rehab setting.  Physiatrist is providing close team supervision and 24 hour management of active medical problems listed below.  Physiatrist and rehab team continue to assess barriers to discharge/monitor patient progress toward functional  and medical goals  Care Tool:  Bathing    Body parts bathed by patient: Chest, Right upper leg, Face   Body parts bathed by helper: Right arm, Left arm, Abdomen, Front perineal area, Buttocks, Left upper leg,  Right lower leg, Left lower leg, Chest, Right upper leg, Face     Bathing assist Assist Level: 2 Helpers     Upper Body Dressing/Undressing Upper body dressing   What is the patient wearing?: Pull over shirt    Upper body assist Assist Level: Dependent - Patient 0%    Lower Body Dressing/Undressing Lower body dressing      What is the patient wearing?: Pants, Incontinence brief     Lower body assist Assist for lower body dressing: Dependent - Patient 0%     Toileting Toileting    Toileting assist Assist for toileting: Dependent - Patient 0%     Transfers Chair/bed transfer  Transfers assist     Chair/bed transfer assist level: Total Assistance - Patient < 25%     Locomotion Ambulation   Ambulation assist   Ambulation activity did not occur: Safety/medical concerns  Assist level: 2 helpers Assistive device: No Device Max distance: 10   Walk 10 feet activity   Assist  Walk 10 feet activity did not occur: Safety/medical concerns  Assist level: 2 helpers Assistive device: No Device   Walk 50 feet activity   Assist Walk 50 feet with 2 turns activity did not occur: Safety/medical concerns         Walk 150 feet activity   Assist Walk 150 feet activity did not occur: Safety/medical concerns         Walk 10 feet on uneven surface  activity   Assist Walk 10 feet on uneven surfaces activity did not occur: Safety/medical concerns         Wheelchair     Assist Will patient use wheelchair at discharge?: Yes Type of Wheelchair: Manual    Wheelchair assist level: Dependent - Patient 0%(TIS w/c) Max wheelchair distance: 150 ft    Wheelchair 50 feet with 2 turns activity    Assist        Assist Level: Dependent - Patient 0%   Wheelchair 150 feet activity     Assist     Assist Level: Dependent - Patient 0%    Medical Problem List and Plan: 1.Dense lefthemiparesis, now with spasticitywith dysphagiasecondary to  embolic shower right MCA occlusion status post reperfusion. Infarct felt to be embolic secondary to hypercoagulable state from lung cancer  -pt with decreased arousal/mental status yesterday. CT performed yesterday evening demonstrates subacute progression of right parietal/temporal infarct while on eliquis.   -pt appears stable this morning although still more confused, tone increased LUE/LLE  -stopped low dose tramadol and ritalin. Will go ahead and stop baclofen as well  -labs yesterday and today unremarkable  -cxr unremarkable yesterday. Check ua again today as well.   -will ask neurology for recommendations  Continue CIR to tolerance  -PRAFO, WHO Left side  -palliative care consult may be appropriate given challenges ahead from neuro/oncological standpoint--I am trying to contact Mrs. Bias to discuss.  -will reach out to [name] as well 2. Antithrombotics: -DVT/anticoagulation/history of right lower extremity IRW:ERXVQMG -antiplatelet therapy: N/A 3. Pain Management/spastic left hemiparesis:Tylenol as needed  -added scheduled tramadol 25mg  bid  -antispasmodics   -have stopped baclofen as above   -orthotics  -kpad for back 4. Mood:Provide emotional support -antipsychotic agents: N/A  -holding ritalin 5. Neuropsych: This  patientiscapable of making decisions on hisown behalf. 6. Skin/Wound Care:Routine skin checks 7. Fluids/Electrolytes/Nutrition:   -resumed IVF ---continue to support BP  -BUN/Cr stable on 8/6 8.Stage IV adenocarcinoma right lung. Chemotherapy as per [name]  -onc appts on temporary hold until after rehab discharge 9. Aspiration pneumonia, low grade temp.   Unasyn completed  WBCs 9.0 Aspiration precautions  CXR with persistent opacities, afebrile  -ucx only 10k  -blood cx pending but don't expect + results 10.  Post stroke dysphagia.   D1honeys per [facility] today 11. Hyperlipidemia. Crestor 12.  Remote tobacco abuse. Counseling 13.Acute on chronic anemia.   Hemoglobin 8.6 on 8/1  Continue to monitor  14 Hypocalcemia-stable at 8.4 on 7/30  -likely related to #8 15.     Tachycardia: has had since admit  ECG  with sinus tachycardia  Unable to tolerate coreg d/t hypotension  Greater than 35 total minutes was spent examining patient, formulating a treatment plan, and in discussion with patient and family.     LOS: 20 days A FACE TO [place] 07/30/2019, 10:02 AM

## 2019-07-31 ENCOUNTER — Inpatient Hospital Stay (HOSPITAL_COMMUNITY): Payer: Medicare Other | Admitting: Physical Therapy

## 2019-07-31 ENCOUNTER — Inpatient Hospital Stay (HOSPITAL_COMMUNITY): Payer: Medicare Other | Admitting: Speech Pathology

## 2019-07-31 ENCOUNTER — Inpatient Hospital Stay (HOSPITAL_COMMUNITY): Payer: Medicare Other

## 2019-07-31 NOTE — Progress Notes (Signed)
Palliative:  Follow up call with patient's spouse, [name], to discuss [place]. She requested yesterday that [place] meeting be held with her and her children. Today she tells me they will not all be available to talk until Monday at 11 am. I will not be available at that time but will ask one of my teammates to follow up with them for [place] discussion then.  Thank you for involving palliative medicine in this patient's care.  Juel Burrow, DNP, AGNP-C Palliative Medicine Team Team Phone # [phone]  Pager # [phone]  NO CHARGE

## 2019-07-31 NOTE — Progress Notes (Signed)
Occupational Therapy Session Note  Patient Details  Name: Kyle Garcia MRN: [id number] Date of Birth: 22-Sep-1951  Today's Date: 07/31/2019 OT Individual Time: 1047-1200 OT Individual Time Calculation (min): 73 min    Short Term Goals: Week 1:  OT Short Term Goal 1 (Week 1): Pt will complete 1 grooming task EOB with 1 assist for sitting balance OT Short Term Goal 1 - Progress (Week 1): Progressing toward goal OT Short Term Goal 2 (Week 1): Pt will initiate washing affected arm with max vcs OT Short Term Goal 2 - Progress (Week 1): Progressing toward goal OT Short Term Goal 3 (Week 1): Pt will exhibit improved sustained attention by initiating placing 1 limb into UB garment OT Short Term Goal 3 - Progress (Week 1): Not met  Skilled Therapeutic Interventions/Progress Updates:    1:1. Pt received in bed with soiled brief and "a little" pain in back. medication already provided by RN. Pt completes rolling with total A in B directions to cleanse peri area, change brief and manage pants. Pt completes total A of 2 SBT  EOB<>w/c with manual facilitation of LE on floor and RUE around OT d/t pushing. Pt completes 3x10 beach ball taps back to OT for sustained attention with MAX VC d/t distracting gym envinroment. Pt completes reaching, visual scanning tasks to L of midline for various objects (bean bags, cones, and clothes pins). Pt able to find 5/10 clothes pins L of midline with max VC and requries HOH A for reaching and attention to locate final 5. Eixted session with pt seated in bed, exit alarm on and call light in reach.  Therapy Documentation Precautions:  Precautions Precautions: Fall Precaution Comments: Dense Lt hemi, pusher Restrictions Weight Bearing Restrictions: No General:    Therapy/Group: Individual Therapy  [name] 07/31/2019, 12:04 PM

## 2019-07-31 NOTE — Progress Notes (Signed)
Speech Language Pathology Daily Session Note  Patient Details  Name: Kyle Garcia MRN: [id number] Date of Birth: [birth year]  Today's Date: 07/31/2019 SLP Individual Time: 50-1430 SLP Individual Time Calculation (min): 55 min  Short Term Goals: Week 4: SLP Short Term Goal 1 (Week 4): Given Total A cues, pt will consume current diet with minimal overt s/s of aspiration. SLP Short Term Goal 2 (Week 4): Patient will demonstrate sustained attention to functional tasks for 5 minutes with Max A verbal cues for redirection. SLP Short Term Goal 3 (Week 4): Given Max A cues, pt will follow 1 step basic directions in 5 out of 10 opportunities. SLP Short Term Goal 4 (Week 4): Given Max A cues, patient will initiate functional tasks in 5 out of 10 opportunities.  Skilled Therapeutic Interventions:  Pt was seen for skilled ST targeting cognitive and dysphagia goals.  Pt was in bed, awake, and alert upon therapist's arrival.  Pt was with nursing and in the middle of a clothing and linens change after lunch.  Pt needed max cues to follow 1 step commands during bed mobility for donning clean shirt and gown.  Therapist facilitated the session with teaspoons of honey thick coffee (per pt's request) to assess toleration of pt's currently prescribed diet.  Pt demonstrated no overt s/s of aspiration with thickened liquids via teaspoon.  Recommend that pt remain on his currently prescribed diet. During informal therapeutic conversations with SLP, pt turned his head slightly past midline to the sound of therapist's voice but did not actually make eye contact with therapist who was seated on his left.  Pt was able to sustain his attention to conversation in a quiet environment for 2-3 conversational turns for a total of ~2-3 minute intervals on average. Pt was oriented to place and situation but not date despite max contextual cues.  Pt was left in bed with bed alarm set and call bell within reach.  Continue per current  plan of care.    Pain Pain Assessment Pain Scale: 0-10 Pain Score: 0-No pain  Therapy/Group: Individual Therapy  Kodie Pick, Selinda Orion 07/31/2019, 4:00 PM

## 2019-07-31 NOTE — Progress Notes (Signed)
[place] PHYSICAL MEDICINE & REHABILITATION PROGRESS NOTE   Subjective/Complaints: Pt up with therapy working on breakfast when I came in. Denies pain. More alert than initially when PT came in room.   ROS: limited d/t cognitive  Objective:   Ct Head Wo Contrast  Result Date: 07/29/2019 CLINICAL DATA:  Altered mental status. Recent stroke with thrombectomy 07/03/2019 EXAM: CT HEAD WITHOUT CONTRAST TECHNIQUE: Contiguous axial images were obtained from the base of the skull through the vertex without intravenous contrast. COMPARISON:  MRI head 07/03/2019 FINDINGS: Brain: Hypodensity in the right posterior temporal and parietal lobe compatible with subacute infarct. The exact date of the infarct cannot be accurately determined however this infarct has progressed significantly since the MRI of 07/03/2019. No associated hemorrhage Generalized atrophy. Subacute infarct in the right anterior cerebral artery territory which was present on the prior MRI. Retro cerebellar CSF cyst unchanged. Vascular: Negative for hyperdense vessel Skull: Negative Sinuses/Orbits: Negative Other: None IMPRESSION: Moderately large right MCA infarct appears subacute and has developed since the prior MRI of 07/03/2019. Subacute infarct also in the right anterior cerebral artery territory which was identified on the prior MRI. Negative for acute hemorrhage.  No midline shift. Generalized atrophy.  Retro cerebellar cyst unchanged. Electronically Signed   By: Franchot Gallo M.D.   On: 07/29/2019 18:22   Dg Chest Port 1 View  Result Date: 07/29/2019 CLINICAL DATA:  Confused, altered mental status, concern for aspiration EXAM: PORTABLE CHEST 1 VIEW COMPARISON:  07/25/2019 FINDINGS: No significant change in heterogeneous and consolidative opacity of the right midlung. The left lung is normally aerated. No new airspace opacity. IMPRESSION: No significant change in heterogeneous and consolidative opacity of the right midlung. The left  lung is normally aerated. No new airspace opacity. Electronically Signed   By: Eddie Candle M.D.   On: 07/29/2019 15:23   Dg Swallowing Func-speech Pathology  Result Date: 07/30/2019 Objective Swallowing Evaluation: Type of Study: MBS-Modified Barium Swallow Study  Patient Details Name: Kyle Garcia MRN: [id number] Date of Birth: [birth year] Today's Date: 07/30/2019 Time: SLP Start Time (ACUTE ONLY): 0954 -SLP Stop Time (ACUTE ONLY): 1016 SLP Time Calculation (min) (ACUTE ONLY): 22 min Past Medical History: Past Medical History: Diagnosis Date . Cancer ([place])   stage 4 adenocarcinoma right lung . GERD (gastroesophageal reflux disease)  . Hyperlipidemia  . Stroke [facility])   25-30 emboli seen on screening MRI Past Surgical History: Past Surgical History: Procedure Laterality Date . CATARACT EXTRACTION  2016 . EYE SURGERY   . IR CT HEAD LTD  07/03/2019 . IR PERCUTANEOUS ART THROMBECTOMY/INFUSION INTRACRANIAL INC DIAG ANGIO  07/03/2019 . RADIOLOGY WITH ANESTHESIA N/A 07/03/2019  Procedure: IR WITH ANESTHESIA;  Surgeon: Luanne Bras, MD;  Location: [place];  Service: Radiology;  Laterality: N/A; . VIDEO BRONCHOSCOPY Bilateral 05/21/2019  Procedure: VIDEO BRONCHOSCOPY WITH FLUORO;  Surgeon: Collene Gobble, MD;  Location: [facility];  Service: Cardiopulmonary;  Laterality: Bilateral; HPI: 68yo M with history of R DVT on Xarelto, adenocarcinoma of the R lung, prior strokes on imaging, HLD, and GERD, who p/w left-sided hemiplegia and R gaze deviation. Found to have R MCA and ACA occlusions. Now s/p mechanical thrombectomy with VIR on 7/10. MBS 7/12 recommended nectar with chin tuck. Pt has exhibited clinical signs of aspiration with nectar thick using chin tuck. Repeat MBS warranted.    Subjective: Pt awake, alert, pleasant, participative Assessment / Plan / Recommendation CHL IP CLINICAL IMPRESSIONS 07/30/2019 Clinical Impression Pt presents with severe oropharyngeal dysphagia that is further compromised  by pt's cognitive  deficits and physical inability to achieve optimal positioning d/t . Pt's oral phase is c/b decreased lingual manipulation of bolus, ineffective AP transfer/propulsion of bolus which results in increased mastication and premature spillage of boluses. Additionally, pt presents with severe pharyngeal phase deficits related to timing of swallow. Pt presents with extended containment of bolus in vallecula prior to initiating swallow. As a result, any portion of the bolus that escapes the vallecula are aspirated prior to swallow. While pt senses aspirate, his cough is not protective or productive in expelling the aspirate. ASPIRATION IS DEPENDENT ON BOLUS SIZE. Honey thick liquids and nectar thick liquids by spoon were consistently consumed without aspiration. Thin liquids via spoon were occasionally aspirated. When consuming food texture and nectar thick liquid via spoon, pt with aspiration as he didn't swallow food texture prior to consuming liquid which dumped into his airway. Based on this study, pt is appropriate for dypshagia 2 with nectar thick liquids by spoon, however during this study, pt ONLY coughed during moments of aspiration. Therefore, if at bedside, pt is coughing with above mentioned diet, would recommend downgrading diet to conservative diet of puree with honey thick liquids via spoon. SLP Visit Diagnosis Dysphagia, oropharyngeal phase (R13.12);Cognitive communication deficit (R41.841) Attention and concentration deficit following -- Frontal lobe and executive function deficit following -- Impact on safety and function Severe aspiration risk;Risk for inadequate nutrition/hydration   CHL IP TREATMENT RECOMMENDATION 07/30/2019 Treatment Recommendations Therapy as outlined in treatment plan below   Prognosis 07/08/2019 Prognosis for Safe Diet Advancement Good Barriers to Reach Goals Cognitive deficits;Severity of deficits Barriers/Prognosis Comment -- CHL IP DIET RECOMMENDATION 07/30/2019 SLP Diet  Recommendations Dysphagia 2 (Fine chop) solids;Nectar thick liquid Liquid Administration via Spoon Medication Administration Whole meds with puree Compensations Small sips/bites;Slow rate;Minimize environmental distractions;Lingual sweep for clearance of pocketing Postural Changes Seated upright at 90 degrees   CHL IP OTHER RECOMMENDATIONS 07/30/2019 Recommended Consults -- Oral Care Recommendations Oral care QID Other Recommendations --   CHL IP FOLLOW UP RECOMMENDATIONS 07/30/2019 Follow up Recommendations Inpatient Rehab   CHL IP FREQUENCY AND DURATION 07/08/2019 Speech Therapy Frequency (ACUTE ONLY) min 2x/week Treatment Duration 2 weeks      CHL IP ORAL PHASE 07/30/2019 Oral Phase Impaired Oral - Pudding Teaspoon -- Oral - Pudding Cup -- Oral - Honey Teaspoon Weak lingual manipulation;Lingual pumping;Reduced posterior propulsion;Holding of bolus;Premature spillage;Decreased bolus cohesion;Delayed oral transit Oral - Honey Cup NT Oral - Nectar Teaspoon Weak lingual manipulation;Lingual pumping;Reduced posterior propulsion;Holding of bolus;Premature spillage;Decreased bolus cohesion;Delayed oral transit Oral - Nectar Cup NT Oral - Nectar Straw Lingual pumping;Reduced posterior propulsion;Holding of bolus;Weak lingual manipulation;Premature spillage;Decreased bolus cohesion;Delayed oral transit Oral - Thin Teaspoon Weak lingual manipulation;Lingual pumping;Reduced posterior propulsion;Holding of bolus;Delayed oral transit;Decreased bolus cohesion;Premature spillage Oral - Thin Cup NT Oral - Thin Straw NT Oral - Puree Lingual pumping;Reduced posterior propulsion;Holding of bolus;Weak lingual manipulation;Premature spillage;Decreased bolus cohesion;Delayed oral transit Oral - Mech Soft Weak lingual manipulation;Lingual pumping;Reduced posterior propulsion;Holding of bolus;Delayed oral transit;Decreased bolus cohesion;Premature spillage Oral - Regular NT Oral - Multi-Consistency -- Oral - Pill -- Oral Phase - Comment --   CHL IP PHARYNGEAL PHASE 07/30/2019 Pharyngeal Phase Impaired Pharyngeal- Pudding Teaspoon -- Pharyngeal -- Pharyngeal- Pudding Cup -- Pharyngeal -- Pharyngeal- Honey Teaspoon Delayed swallow initiation-vallecula Pharyngeal -- Pharyngeal- Honey Cup NT Pharyngeal -- Pharyngeal- Nectar Teaspoon Delayed swallow initiation-vallecula Pharyngeal -- Pharyngeal- Nectar Cup Delayed swallow initiation-vallecula;Reduced airway/laryngeal closure;Penetration/Aspiration before swallow;Moderate aspiration Pharyngeal Material enters airway, passes BELOW cords and not ejected out despite  cough attempt by patient Pharyngeal- Nectar Straw Delayed swallow initiation-vallecula;Reduced airway/laryngeal closure;Penetration/Aspiration before swallow;Moderate aspiration Pharyngeal Material enters airway, passes BELOW cords and not ejected out despite cough attempt by patient Pharyngeal- Thin Teaspoon Delayed swallow initiation-vallecula;Reduced airway/laryngeal closure;Penetration/Aspiration before swallow;Moderate aspiration Pharyngeal Material enters airway, passes BELOW cords and not ejected out despite cough attempt by patient Pharyngeal- Thin Cup NT Pharyngeal -- Pharyngeal- Thin Straw NT Pharyngeal -- Pharyngeal- Puree Delayed swallow initiation-vallecula Pharyngeal -- Pharyngeal- Mechanical Soft Delayed swallow initiation-vallecula Pharyngeal -- Pharyngeal- Regular NT Pharyngeal -- Pharyngeal- Multi-consistency -- Pharyngeal -- Pharyngeal- Pill -- Pharyngeal -- Pharyngeal Comment --  CHL IP CERVICAL ESOPHAGEAL PHASE 07/30/2019 Cervical Esophageal Phase WFL Pudding Teaspoon -- Pudding Cup -- Honey Teaspoon -- Honey Cup -- Nectar Teaspoon -- Nectar Cup -- Nectar Straw -- Thin Teaspoon -- Thin Cup -- Thin Straw -- Puree -- Mechanical Soft -- Regular -- Multi-consistency -- Pill -- Cervical Esophageal Comment -- Happi Overton 07/30/2019, 12:43 PM              Recent Labs    07/29/19 1503  WBC 9.0  HGB 9.1*  HCT 29.6*  PLT 156   Recent  Labs    07/29/19 1503 07/30/19 0809  NA 136 134*  K 3.9 4.2  CL 101 102  CO2 25 20*  GLUCOSE 103* 97  BUN 21 18  CREATININE 0.73 0.73  CALCIUM 8.5* 7.9*    Intake/Output Summary (Last 24 hours) at 07/31/2019 1150 Last data filed at 07/31/2019 0900 Gross per 24 hour  Intake 1700.88 ml  Output -  Net 1700.88 ml     Physical Exam: Vital Signs Blood pressure 96/66, pulse (!) 119, temperature 98.7 F (37.1 C), temperature source Oral, resp. rate 18, height 5\' 11"  (1.803 m), weight 59.9 kg, SpO2 97 %. Constitutional: No distress . Vital signs reviewed. HEENT: EOMI, oral membranes moist Neck: supple Cardiovascular: tachy without murmur. No JVD    Respiratory: CTA Bilaterally without wheezes or rales. Normal effort    GI: BS +, non-tender, non-distended  Musc: No edema or tenderness in extremities. Neurologic: Overall more confused. Can follow simple commands but very distracted, unable to focus. Followed simple commands. .  Motor: RUE/RLE: Grossly 5/5  LUE/LLE: 0/5 Left neglect, left upper flexor and lower ext tone 2-3/4, DTR's 3+  Psych: Flat , distracted   Assessment/Plan: 1. Functional deficits secondary to Right MCA infarct with dysphagia  which require 3+ hours per day of interdisciplinary therapy in a comprehensive inpatient rehab setting.  Physiatrist is providing close team supervision and 24 hour management of active medical problems listed below.  Physiatrist and rehab team continue to assess barriers to discharge/monitor patient progress toward functional and medical goals  Care Tool:  Bathing    Body parts bathed by patient: Chest, Right upper leg, Face   Body parts bathed by helper: Right arm, Left arm, Abdomen, Front perineal area, Buttocks, Left upper leg, Right lower leg, Left lower leg, Chest, Right upper leg, Face     Bathing assist Assist Level: 2 Helpers     Upper Body Dressing/Undressing Upper body dressing   What is the patient wearing?: Pull over  shirt    Upper body assist Assist Level: Dependent - Patient 0%    Lower Body Dressing/Undressing Lower body dressing      What is the patient wearing?: Pants, Incontinence brief     Lower body assist Assist for lower body dressing: Dependent - Patient 0%     Toileting Toileting  Toileting assist Assist for toileting: Dependent - Patient 0%     Transfers Chair/bed transfer  Transfers assist     Chair/bed transfer assist level: 2 Helpers     Locomotion Ambulation   Ambulation assist   Ambulation activity did not occur: Safety/medical concerns  Assist level: 2 helpers Assistive device: No Device Max distance: 10   Walk 10 feet activity   Assist  Walk 10 feet activity did not occur: Safety/medical concerns  Assist level: 2 helpers Assistive device: No Device   Walk 50 feet activity   Assist Walk 50 feet with 2 turns activity did not occur: Safety/medical concerns         Walk 150 feet activity   Assist Walk 150 feet activity did not occur: Safety/medical concerns         Walk 10 feet on uneven surface  activity   Assist Walk 10 feet on uneven surfaces activity did not occur: Safety/medical concerns         Wheelchair     Assist Will patient use wheelchair at discharge?: Yes Type of Wheelchair: Manual    Wheelchair assist level: Dependent - Patient 0%(TIS w/c) Max wheelchair distance: 150 ft    Wheelchair 50 feet with 2 turns activity    Assist        Assist Level: Dependent - Patient 0%   Wheelchair 150 feet activity     Assist     Assist Level: Dependent - Patient 0%    Medical Problem List and Plan: 1.Dense lefthemiparesis, now with spasticitywith dysphagiasecondary to embolic shower right MCA occlusion status post reperfusion. Infarct felt to be embolic secondary to hypercoagulable state from lung cancer  -have spoken with wife as well as Dr. Julien Nordmann. Both are in favor of making sure he's  comfortable. Further CTX is not planned at this time  -Appreciate palliative care assistance. They are arranging a meeting time with wife and children to discuss goals of care.  -continue to address functional issues as possible with therapies 2. Antithrombotics: -DVT/anticoagulation/history of right lower extremity JIR:CVELFYB -antiplatelet therapy: N/A 3. Pain Management/spastic left hemiparesis:Tylenol only as we're trying to avoid oversedation  - -antispasmodics   -have stopped baclofen as above   -orthotics  -kpad for back 4. Mood:Provide emotional support -antipsychotic agents: N/A  -holding ritalin 5. Neuropsych: This patientiscapable of making decisions on hisown behalf. 6. Skin/Wound Care:Routine skin checks 7. Fluids/Electrolytes/Nutrition:   -resumed IVF ---continue to support BP  -BUN/Cr stable on 8/6 8.Stage IV adenocarcinoma right lung. Chemotherapy as per [name]  -onc appts on temporary hold until after rehab discharge 9. Aspiration pneumonia, low grade temp.   Unasyn completed  WBCs 9.0 Aspiration precautions  CXR with persistent opacities, afebrile  -ucx only 10k  -blood cx NG 10.  Post stroke dysphagia.   D1honeys per MBS today 11. Hyperlipidemia. Crestor 12. Remote tobacco abuse. Counseling 13.Acute on chronic anemia.   Hemoglobin 8.6 on 8/1  Continue to monitor  14 Hypocalcemia-stable at 8.4 on 7/30  -likely related to #8 15.     Tachycardia: has had since admit--110's to 120's  ECG  with sinus tachycardia  Unable to tolerate coreg d/t hypotension      LOS: 21 days A FACE TO FACE EVALUATION WAS PERFORMED  [name] 07/31/2019, 11:50 AM

## 2019-07-31 NOTE — Progress Notes (Signed)
Physical Therapy Weekly Progress Note  Patient Details  Name: Kyle Garcia MRN: [id number] Date of Birth: 18-Nov-1951  Beginning of progress report period: July 24, 2019 End of progress report period: July 31, 2019  Today's Date: 07/31/2019 PT Individual Time: 0805-0859 PT Individual Time Calculation (min): 54 min   Patient has met 0 of 3 short term goals.  Pt has experienced a medical, thus a functional decline, over the past week. Pt was able to somewhat participate in sessions but now requires total<>dependent +2 assist for all mobility with fluctuating levels of awareness & orientation. MD has recommended a palliative consult for pt & family. Pt would benefit from continued skilled PT treatment to focus on head/trunk control, sitting balance, bed mobility, and bed<>w/c transfers. Caregivers would benefit from extensive hands on training if they plan to take family home with 24/7 care.   Patient continues to demonstrate the following deficits muscle weakness and muscle joint tightness, decreased cardiorespiratoy endurance, motor apraxia, decreased coordination and decreased motor planning, decreased visual acuity, decreased visual perceptual skills and decreased visual motor skills, decreased midline orientation, decreased attention to left, left side neglect and decreased motor planning, decreased initiation, decreased attention, decreased awareness, decreased problem solving, decreased safety awareness, decreased memory and delayed processing, central origin and decreased sitting balance, decreased standing balance, decreased postural control, hemiplegia and decreased balance strategies and therefore will continue to benefit from skilled PT intervention to increase functional independence with mobility.  Patient not progressing toward long term goals.  See goal revision..  Plan of care revisions: sitting balance, bed<>w/c and bed mobility downgraded to max assist 2/2 slow progress & decline,  sit<>stand, gait, and w/c mobility goals discontinued as they are not appropriate at this time.  PT Short Term Goals Week 3:  PT Short Term Goal 1 (Week 3): Pt will perform bed mobility with max assist +1. PT Short Term Goal 1 - Progress (Week 3): Not met PT Short Term Goal 2 (Week 3): Pt will complete bed<>w/c with max assist +1. PT Short Term Goal 2 - Progress (Week 3): Not met PT Short Term Goal 3 (Week 3): Pt will consistently demonstrate static sitting balance with mod assist +1. PT Short Term Goal 3 - Progress (Week 3): Not met Week 4:  PT Short Term Goal 1 (Week 4): Pt will participate in bed mobliity tasks 50% of the time with max cuing. PT Short Term Goal 2 (Week 4): Pt will participate in bed<>w/c transfers 50% of the time with max cuing.  Skilled Therapeutic Interventions/Progress Updates:  Pt received in bed. Pt with behaviors demonstrating pain in LUE & during bed mobility with repositioning & rest breaks provided. Pt requires dependent +2 assist for rolling L<>R & dependent assist for donning pants bed level. Pt requires dependent assist+2 for supine>sit and slide board to w/c. Once up in w/c, attempted to have pt wash face with cloth with therapist providing total hand over hand assist and pt somewhat resistive with RUE. Attempted to have pt comb hair with total assist hand over hand RUE with pt a little more engaged with task. Initially during session pt moaning with incoherent speech but by end of session pt with more clear words & able to report he had a stroke. Pt does report he's hungry and provided total assist for consuming thickened liquid and food. Eventually pt able to participate in holding spoon & bringing it to mouth with max assist hand over hand, but then pt perseverates on this and continues  motion without spoon in hand. Provided dependent assist for oral hygiene. Pt able to locate therapist in R visual field to give a couple high fives at end of session. Pt left in TIS  w/c with chair alarm & lay tray donned, call bell in lap.   Therapy Documentation Precautions:  Precautions Precautions: Fall Precaution Comments: Dense Lt hemi, pusher Restrictions Weight Bearing Restrictions: No   Therapy/Group: Individual Therapy  [name] 07/31/2019, 10:48 AM

## 2019-07-31 NOTE — Progress Notes (Signed)
Speech Language Pathology Note  Patient Details  Name: Kyle Garcia MRN: [id number] Date of Birth: [birth year] Today's Date: 07/31/2019  This writer assisted PT with initial consumption of pt's breakfast. This Probation officer provided information to allow pt one bite at a time and watch pt for swallow before providing another spoonful of puree or honey thick liquids. Pt with effective oral clearing of honey thick liquids via spoon. With had some residue of puree on left buccal cavity with SLP providing slight tactile pressure to left check to increase pt's awareness of residue and pt able to clear with only tactile pressure. Would not recommend verbally engaging with pt during consumption as he has not demonstrated ability to answer questions regarding swallowing or oral residue correctly and verbal interaction only increases pt's risk of choking. Simple apply tactile pressure to cheek area to clear residue. Education also provided to pt's NT for today on recommended strategies.    Later in day, SLP passing by pt's room. Pt laying flat in bed, coughing on his secretions and presented with wet voice. SLP raised HOB and pt able to produce clear voicing. Education provided to nursing on keeping pt more upright throughout day.   30 minutes of non-billable time spent with pt care to decrease aspiration risk.     Issam Carlyon 07/31/2019, 10:22 AM

## 2019-07-31 NOTE — Plan of Care (Signed)
  Problem: Consults Goal: RH STROKE PATIENT EDUCATION Description: See Patient Education module for education specifics  Outcome: Progressing   Problem: RH BOWEL ELIMINATION Goal: RH STG MANAGE BOWEL WITH ASSISTANCE Description: STG Manage Bowel with MOD I  Assistance. Outcome: Progressing   Problem: RH BLADDER ELIMINATION Goal: RH STG MANAGE BLADDER WITH ASSISTANCE Description: STG Manage Bladder With MIN Assistance Outcome: Progressing   Problem: RH SAFETY Goal: RH STG ADHERE TO SAFETY PRECAUTIONS W/ASSISTANCE/DEVICE Description: STG Adhere to Safety Precautions With MIN Assistance/Device. Outcome: Progressing   Problem: RH PAIN MANAGEMENT Goal: RH STG PAIN MANAGED AT OR BELOW PT'S PAIN GOAL Description: AT OR BELOW LEVEL 4 Outcome: Progressing   Problem: RH KNOWLEDGE DEFICIT Goal: RH STG INCREASE KNOWLEDGE OF HYPERTENSION Description: Pt and wife will state understanding of HTN using medications, dietary restrictions, exercise and using handouts and educational resources independently Outcome: Progressing Goal: RH STG INCREASE KNOWLEDGE OF DYSPHAGIA/FLUID INTAKE Description: Pt and wife will state understanding of dysphagia using handouts and educational resources independently Outcome: Progressing Goal: RH STG INCREASE KNOWLEGDE OF HYPERLIPIDEMIA Description: Pt and wife will state understanding of HLD using medications, dietary restrictions, exercise and using handouts and educational resources independently Outcome: Progressing Goal: RH STG INCREASE KNOWLEDGE OF STROKE PROPHYLAXIS Description: Pt and wife will state understanding of secondary stroke using medications, dietary restrictions, exercise and using handouts and educational resources independently Outcome: Progressing

## 2019-08-01 ENCOUNTER — Inpatient Hospital Stay (HOSPITAL_COMMUNITY): Payer: Medicare Other | Admitting: Speech Pathology

## 2019-08-01 NOTE — Progress Notes (Addendum)
Speech Language Pathology Daily Session Note  Patient Details  Name: Kyle Garcia MRN: [id number] Date of Birth: [birth year]  Today's Date: 08/01/2019 SLP Individual Time: 0940-1000; [id number] SLP Individual Time Calculation (min): 20 min; 25 min  Short Term Goals: Week 4: SLP Short Term Goal 1 (Week 4): Given Total A cues, pt will consume current diet with minimal overt s/s of aspiration. SLP Short Term Goal 2 (Week 4): Patient will demonstrate sustained attention to functional tasks for 5 minutes with Max A verbal cues for redirection. SLP Short Term Goal 3 (Week 4): Given Max A cues, pt will follow 1 step basic directions in 5 out of 10 opportunities. SLP Short Term Goal 4 (Week 4): Given Max A cues, patient will initiate functional tasks in 5 out of 10 opportunities.  Skilled Therapeutic Interventions:  Skilled treatment session focused on cognition and dysphagia goals. SLP facilitated session by repositioning pt for optimal safe consumption of POs. Pt is dependent upon SLP for feeding d/t cognitive deficits. Pt consumed 4 oz honey thick liquids via tsp with no overt s/s of aspiration.   During second treatment with pt, pt's daughter and son were here "just to visit with dad." SLP offered education/caregiver education but both expressed that they were here to "visit with him because they had been given special permission since they had not seen him in so long." Nursing made aware as no one on unit today was aware of family's intent to visit. SLP facilitated session by providing skilled observation of pt consuming honey thick liquids via tsp. Basic information was provided with both children appreciative. After 3 oz, pt was distracted and vocal quality became wet. Despite Max A multimodal cues, pt not able to follow directions for volitional throat clear, cough or volitional swallow. Pt able to produce swallow when given dry spoon. Pt's daughter was seated to pt's left. Despite Total A visual,  verbal and tactile cues, pt was able to turn head to left but not able to recognize daughter and began using language of confusion. This was obviously distressing to pt's family.      Pain Pain Assessment Pain Scale: 0-10 Pain Score: 0-No pain  Therapy/Group: Individual Therapy  Numair Masden 08/01/2019, 12:41 PM

## 2019-08-01 NOTE — Progress Notes (Signed)
[place] PHYSICAL MEDICINE & REHABILITATION PROGRESS NOTE   Subjective/Complaints: Pt up in bed. Fairly alert. Denies any pain.   ROS: Limited due to cognitive/behavioral   Objective:   No results found. Recent Labs    07/29/19 1503  WBC 9.0  HGB 9.1*  HCT 29.6*  PLT 156   Recent Labs    07/29/19 1503 07/30/19 0809  NA 136 134*  K 3.9 4.2  CL 101 102  CO2 25 20*  GLUCOSE 103* 97  BUN 21 18  CREATININE 0.73 0.73  CALCIUM 8.5* 7.9*    Intake/Output Summary (Last 24 hours) at 08/01/2019 1104 Last data filed at 08/01/2019 0900 Gross per 24 hour  Intake 120 ml  Output -  Net 120 ml     Physical Exam: Vital Signs Blood pressure 114/79, pulse (!) 113, temperature 98.7 F (37.1 C), resp. rate 16, height 5\' 11"  (1.803 m), weight 58.8 kg, SpO2 97 %. Constitutional: No distress . Vital signs reviewed. HEENT: EOMI, oral membranes moist Neck: supple Cardiovascular: RRR without murmur. No JVD    Respiratory: CTA Bilaterally without wheezes or rales. Normal effort    GI: BS +, non-tender, non-distended  Musc: No edema or tenderness in extremities. Neurologic: Overall more confused. Can follow simple commands but very distracted, unable to focus. Followed simple commands. .  Motor: RUE/RLE: Grossly 5/5  LUE/LLE: 0/5 Left neglect, left upper flexor and lower ext tone 2-3/4, DTR's 3+--stable today  Psych: alert, flat   Assessment/Plan: 1. Functional deficits secondary to Right MCA infarct with dysphagia  which require 3+ hours per day of interdisciplinary therapy in a comprehensive inpatient rehab setting.  Physiatrist is providing close team supervision and 24 hour management of active medical problems listed below.  Physiatrist and rehab team continue to assess barriers to discharge/monitor patient progress toward functional and medical goals  Care Tool:  Bathing    Body parts bathed by patient: Chest, Right upper leg, Face   Body parts bathed by helper: Right  arm, Left arm, Abdomen, Front perineal area, Buttocks, Left upper leg, Right lower leg, Left lower leg, Chest, Right upper leg, Face     Bathing assist Assist Level: 2 Helpers     Upper Body Dressing/Undressing Upper body dressing   What is the patient wearing?: Pull over shirt    Upper body assist Assist Level: Dependent - Patient 0%    Lower Body Dressing/Undressing Lower body dressing      What is the patient wearing?: Pants, Incontinence brief     Lower body assist Assist for lower body dressing: Dependent - Patient 0%     Toileting Toileting    Toileting assist Assist for toileting: Dependent - Patient 0%     Transfers Chair/bed transfer  Transfers assist     Chair/bed transfer assist level: 2 Helpers     Locomotion Ambulation   Ambulation assist   Ambulation activity did not occur: Safety/medical concerns  Assist level: 2 helpers Assistive device: No Device Max distance: 10   Walk 10 feet activity   Assist  Walk 10 feet activity did not occur: Safety/medical concerns  Assist level: 2 helpers Assistive device: No Device   Walk 50 feet activity   Assist Walk 50 feet with 2 turns activity did not occur: Safety/medical concerns         Walk 150 feet activity   Assist Walk 150 feet activity did not occur: Safety/medical concerns         Walk 10 feet on  uneven surface  activity   Assist Walk 10 feet on uneven surfaces activity did not occur: Safety/medical concerns         Wheelchair     Assist Will patient use wheelchair at discharge?: Yes Type of Wheelchair: Manual    Wheelchair assist level: Dependent - Patient 0%(TIS w/c) Max wheelchair distance: 150 ft    Wheelchair 50 feet with 2 turns activity    Assist        Assist Level: Dependent - Patient 0%   Wheelchair 150 feet activity     Assist     Assist Level: Dependent - Patient 0%    Medical Problem List and Plan: 1.Dense lefthemiparesis,  now with spasticitywith dysphagiasecondary to embolic shower right MCA occlusion status post reperfusion. Infarct felt to be embolic secondary to hypercoagulable state from lung cancer  -have spoken with wife as well as Dr. Julien Nordmann. Both are in favor of making sure he's comfortable. Further CTX is not planned at this time  -Appreciate palliative care assistance. They are arranging a meeting time with wife and children to discuss goals of care.  -continue to address functional issues as possible with therapies 2. Antithrombotics: -DVT/anticoagulation/history of right lower extremity MWN:[id number] -antiplatelet therapy: N/A 3. Pain Management/spastic left hemiparesis:Tylenol only as we're trying to avoid oversedation  - -antispasmodics   -have stopped baclofen as above---seems to have perked up a bit off medication   -orthotics  -kpad for back 4. Mood:Provide emotional support -antipsychotic agents: N/A  -holding ritalin 5. Neuropsych: This patientiscapable of making decisions on hisown behalf. 6. Skin/Wound Care:Routine skin checks 7. Fluids/Electrolytes/Nutrition:   -resumed IVF ---continue to support BP  -BUN/Cr stable on 8/6 8.Stage IV adenocarcinoma right lung. Chemotherapy as per [name]  -onc appts on temporary hold until after rehab discharge 9. Aspiration pneumonia, low grade temp.   Unasyn completed  WBCs 9.0 Aspiration precautions  CXR with persistent opacities, afebrile  -ucx only 10k  -blood cx NG 10.  Post stroke dysphagia.   D1honeys per MBS today 11. Hyperlipidemia. Crestor 12. Remote tobacco abuse. Counseling 13.Acute on chronic anemia.   Hemoglobin 8.6 on 8/1  Continue to monitor  14 Hypocalcemia-stable at 8.4 on 7/30  -likely related to #8 15.     Tachycardia: has had since admit--110's to 120's  ECG  with sinus tachycardia  Unable to tolerate coreg d/t hypotension      LOS: 22 days A FACE TO  FACE EVALUATION WAS PERFORMED  [name] 08/01/2019, 11:04 AM

## 2019-08-01 NOTE — Plan of Care (Signed)
  Problem: Consults Goal: RH STROKE PATIENT EDUCATION Description: See Patient Education module for education specifics  Outcome: Progressing   Problem: RH BOWEL ELIMINATION Goal: RH STG MANAGE BOWEL WITH ASSISTANCE Description: STG Manage Bowel with MOD I  Assistance. Outcome: Progressing   Problem: RH SAFETY Goal: RH STG ADHERE TO SAFETY PRECAUTIONS W/ASSISTANCE/DEVICE Description: STG Adhere to Safety Precautions With MIN Assistance/Device. Outcome: Progressing   Problem: RH PAIN MANAGEMENT Goal: RH STG PAIN MANAGED AT OR BELOW PT'S PAIN GOAL Description: AT OR BELOW LEVEL 4 Outcome: Progressing   Problem: RH KNOWLEDGE DEFICIT Goal: RH STG INCREASE KNOWLEDGE OF HYPERTENSION Description: Pt and wife will state understanding of HTN using medications, dietary restrictions, exercise and using handouts and educational resources independently Outcome: Progressing Goal: RH STG INCREASE KNOWLEDGE OF DYSPHAGIA/FLUID INTAKE Description: Pt and wife will state understanding of dysphagia using handouts and educational resources independently Outcome: Progressing Goal: RH STG INCREASE KNOWLEGDE OF HYPERLIPIDEMIA Description: Pt and wife will state understanding of HLD using medications, dietary restrictions, exercise and using handouts and educational resources independently Outcome: Progressing Goal: RH STG INCREASE KNOWLEDGE OF STROKE PROPHYLAXIS Description: Pt and wife will state understanding of secondary stroke using medications, dietary restrictions, exercise and using handouts and educational resources independently Outcome: Progressing   Problem: RH BLADDER ELIMINATION Goal: RH STG MANAGE BLADDER WITH ASSISTANCE Description: STG Manage Bladder With MIN Assistance Outcome: Not Progressing

## 2019-08-02 ENCOUNTER — Inpatient Hospital Stay (HOSPITAL_COMMUNITY): Payer: Medicare Other

## 2019-08-02 ENCOUNTER — Inpatient Hospital Stay (HOSPITAL_COMMUNITY): Payer: Medicare Other | Admitting: Physical Therapy

## 2019-08-02 NOTE — Progress Notes (Signed)
[facility] PHYSICAL MEDICINE & REHABILITATION PROGRESS NOTE   Subjective/Complaints: OT working with pt. No new problems reported by RN  ROS: Patient denies fever, rash, sore throat, blurred vision, nausea, vomiting, diarrhea, cough, shortness of breath or chest pain, joint or back pain, headache, or mood change.   Objective:   No results found. No results for input(s): WBC, HGB, HCT, PLT in the last 72 hours. No results for input(s): NA, K, CL, CO2, GLUCOSE, BUN, CREATININE, CALCIUM in the last 72 hours.  Intake/Output Summary (Last 24 hours) at 08/02/2019 1023 Last data filed at 08/02/2019 0806 Gross per 24 hour  Intake 300 ml  Output -  Net 300 ml     Physical Exam: Vital Signs Blood pressure 118/79, pulse (!) 118, temperature 99 F (37.2 C), resp. rate 18, height 5\' 11"  (1.803 m), weight 58.8 kg, SpO2 97 %. Constitutional: No distress . Vital signs reviewed. HEENT: EOMI, oral membranes moist Neck: supple Cardiovascular: RRR without murmur. No JVD    Respiratory: CTA Bilaterally without wheezes or rales. Normal effort    GI: BS +, non-tender, non-distended  Musc: No edema or tenderness in extremities. Neurologic: more alert. Still with limited awareness.. Followed simple commands. .  Motor: RUE/RLE: Grossly 5/5  LUE/LLE: 0/5 Left neglect ongoing left upper flexor and lower ext tone 2-3/4, DTR's 3+-   Psych: alert, flat   Assessment/Plan: 1. Functional deficits secondary to Right MCA infarct with dysphagia  which require 3+ hours per day of interdisciplinary therapy in a comprehensive inpatient rehab setting.  Physiatrist is providing close team supervision and 24 hour management of active medical problems listed below.  Physiatrist and rehab team continue to assess barriers to discharge/monitor patient progress toward functional and medical goals  Care Tool:  Bathing    Body parts bathed by patient: Face, Right arm, Left arm, Abdomen, Chest, Front perineal area,  Buttocks, Right upper leg, Left upper leg, Left lower leg, Right lower leg   Body parts bathed by helper: Right arm, Left arm, Abdomen, Front perineal area, Buttocks, Left upper leg, Right lower leg, Left lower leg, Chest, Right upper leg, Face     Bathing assist Assist Level: Total Assistance - Patient < 25%     Upper Body Dressing/Undressing Upper body dressing   What is the patient wearing?: Pull over shirt    Upper body assist Assist Level: Dependent - Patient 0%    Lower Body Dressing/Undressing Lower body dressing      What is the patient wearing?: Pants, Incontinence brief     Lower body assist Assist for lower body dressing: Dependent - Patient 0%     Toileting Toileting    Toileting assist Assist for toileting: Dependent - Patient 0%     Transfers Chair/bed transfer  Transfers assist     Chair/bed transfer assist level: Total Assistance - Patient < 25%     Locomotion Ambulation   Ambulation assist   Ambulation activity did not occur: Safety/medical concerns  Assist level: 2 helpers Assistive device: No Device Max distance: 10   Walk 10 feet activity   Assist  Walk 10 feet activity did not occur: Safety/medical concerns  Assist level: 2 helpers Assistive device: No Device   Walk 50 feet activity   Assist Walk 50 feet with 2 turns activity did not occur: Safety/medical concerns         Walk 150 feet activity   Assist Walk 150 feet activity did not occur: Safety/medical concerns  Walk 10 feet on uneven surface  activity   Assist Walk 10 feet on uneven surfaces activity did not occur: Safety/medical concerns         Wheelchair     Assist Will patient use wheelchair at discharge?: Yes Type of Wheelchair: Manual    Wheelchair assist level: Dependent - Patient 0%(TIS w/c) Max wheelchair distance: 150 ft    Wheelchair 50 feet with 2 turns activity    Assist        Assist Level: Dependent - Patient  0%   Wheelchair 150 feet activity     Assist     Assist Level: Dependent - Patient 0%    Medical Problem List and Plan: 1.Dense lefthemiparesis, now with spasticitywith dysphagiasecondary to embolic shower right MCA occlusion status post reperfusion. Infarct felt to be embolic secondary to hypercoagulable state from lung cancer  -have spoken with wife as well as Dr. Julien Nordmann. Both are in favor of making sure he's comfortable. Further CTX is not planned at this time  -Appreciate palliative care assistance. They are arranging a meeting time with wife and children to discuss goals of care----looks like they will convene tomorrow  -continue to address functional issues as possible with therapies 2. Antithrombotics: -DVT/anticoagulation/history of right lower extremity LDJ:[id number] -antiplatelet therapy: N/A 3. Pain Management/spastic left hemiparesis:Tylenol only as we're trying to avoid oversedation  - -antispasmodics   -have stopped baclofen as above---seems to have perked up a bit off medication   -orthotics  -kpad for back 4. Mood:Provide emotional support -antipsychotic agents: N/A  -holding ritalin 5. Neuropsych: This patientiscapable of making decisions on hisown behalf. 6. Skin/Wound Care:Routine skin checks 7. Fluids/Electrolytes/Nutrition:   -resumed IVF ---continue to support BP  -BUN/Cr stable on 8/6 8.Stage IV adenocarcinoma right lung. Chemotherapy as per [name]  -onc appts on temporary hold until after rehab discharge 9. Aspiration pneumonia, low grade temp.   Unasyn completed  WBCs 9.0 Aspiration precautions  CXR with persistent opacities, afebrile  -ucx only 10k  -blood cx NG 10.  Post stroke dysphagia.   D1honeys per MBS today 11. Hyperlipidemia. Crestor 12. Remote tobacco abuse. Counseling 13.Acute on chronic anemia.   Hemoglobin 8.6 on 8/1  Continue to monitor  14 Hypocalcemia-stable at  8.4 on 7/30  -likely related to #8 15.     Tachycardia: has had since admit--110's to 120's---no real change  ECG  with sinus tachycardia  Unable to tolerate coreg d/t hypotension      LOS: 23 days A FACE TO FACE EVALUATION WAS PERFORMED  [name] 08/02/2019, 10:23 AM

## 2019-08-02 NOTE — Plan of Care (Signed)
  Problem: Consults Goal: RH STROKE PATIENT EDUCATION Description: See Patient Education module for education specifics  08/02/2019 0914 by Hillery Jacks, RN Outcome: Progressing 08/02/2019 0913 by Hillery Jacks, RN Outcome: Progressing   Problem: RH BOWEL ELIMINATION Goal: RH STG MANAGE BOWEL WITH ASSISTANCE Description: STG Manage Bowel with MAX Assistance. 08/02/2019 0914 by Hillery Jacks, RN Outcome: Progressing 08/02/2019 0913 by Hillery Jacks, RN Outcome: Progressing   Problem: RH SAFETY Goal: RH STG ADHERE TO SAFETY PRECAUTIONS W/ASSISTANCE/DEVICE Description: STG Adhere to Safety Precautions With MIN Assistance/Device. 08/02/2019 0914 by Hillery Jacks, RN Outcome: Progressing 08/02/2019 0913 by Hillery Jacks, RN Outcome: Progressing   Problem: RH PAIN MANAGEMENT Goal: RH STG PAIN MANAGED AT OR BELOW PT'S PAIN GOAL Description: AT OR BELOW LEVEL 4 08/02/2019 0914 by Hillery Jacks, RN Outcome: Progressing 08/02/2019 0913 by Hillery Jacks, RN Outcome: Progressing   Problem: RH KNOWLEDGE DEFICIT Goal: RH STG INCREASE KNOWLEDGE OF HYPERTENSION Description: Pt and wife will state understanding of HTN using medications, dietary restrictions, exercise and using handouts and educational resources independently 08/02/2019 0914 by Hillery Jacks, RN Outcome: Progressing 08/02/2019 0913 by Hillery Jacks, RN Outcome: Progressing Goal: RH STG INCREASE KNOWLEDGE OF DYSPHAGIA/FLUID INTAKE Description: Pt and wife will state understanding of dysphagia using handouts and educational resources independently 08/02/2019 0914 by Hillery Jacks, RN Outcome: Progressing 08/02/2019 0913 by Hillery Jacks, RN Outcome: Progressing Goal: RH STG INCREASE KNOWLEGDE OF HYPERLIPIDEMIA Description: Pt and wife will state understanding of HLD using medications, dietary restrictions, exercise and using handouts and educational resources independently 08/02/2019 0914 by Hillery Jacks, RN Outcome:  Progressing 08/02/2019 0913 by Hillery Jacks, RN Outcome: Progressing Goal: RH STG INCREASE KNOWLEDGE OF STROKE PROPHYLAXIS Description: Pt and wife will state understanding of secondary stroke using medications, dietary restrictions, exercise and using handouts and educational resources independently 08/02/2019 0914 by Hillery Jacks, RN Outcome: Progressing 08/02/2019 0913 by Hillery Jacks, RN Outcome: Progressing

## 2019-08-02 NOTE — Progress Notes (Signed)
Occupational Therapy Session Note  Patient Details  Name: Kyle Garcia MRN: [id number] Date of Birth: May 13, 1951  Today's Date: 08/02/2019 OT Individual Time: 4098-1191 OT Individual Time Calculation (min): 70 min    Short Term Goals: Week 3:  OT Short Term Goal 1 (Week 3): Pt will initiate bathing one body part with mod vc OT Short Term Goal 2 (Week 3): Pt will attend to one grooming task with mod vc OT Short Term Goal 3 (Week 3): Pt will don shirt with max A  Skilled Therapeutic Interventions/Progress Updates:    pt received supine with c/o 2/10 in back, no request for intervention. Pt on bed pan, NT entering room to assist hygiene. Total A +2 for pt to roll R and for peri hygiene following BM. Pt completed bed mobility to EOB with max A. With R UE placed on bed rail pt was able to maintain EOB sitting balance with CGA for several minutes. Total A squat pivot transfer to TIS w/c. Pt was able to wash face sitting at sink with min cueing. Pt was unable to sequence washing any other part of his body, washing his face over and over again when cued to bathe. Even with [facility] assistance pt unable to participate meaningfully in bathing. Total A for UB bathing and dressing. Total A for oral care and hygiene, but pt able to follow directions accurately to allow for care to be performed. Pt requesting a cup of coffee, so one was made for him and thickened to honey consistency. Pt required max A for self feeding with teaspoon. Pt also ate 25% of a magic cup with max A. PROM was performed to pt's LUE with increased pain observed, with pt grimacing with elbow extension. To respect pain, PROM was done very slowly and with multiple trials, with pt tolerating more by end of session. Pt was left sitting up with all needs met, chair alarm belt fastened.   Therapy Documentation Precautions:  Precautions Precautions: Fall Precaution Comments: Dense Lt hemi, pusher Restrictions Weight Bearing Restrictions:  No   Therapy/Group: Individual Therapy  [name] 08/02/2019, 7:03 AM

## 2019-08-02 NOTE — Progress Notes (Addendum)
Physical Therapy Session Note  Patient Details  Name: Kyle Garcia MRN: [id number] Date of Birth: [birth year]  Today's Date: 08/02/2019 PT Individual Time: 1039-1106 PT Individual Time Calculation (min): 27 min   Short Term Goals: Week 4:  PT Short Term Goal 1 (Week 4): Pt will participate in bed mobliity tasks 50% of the time with max cuing. PT Short Term Goal 2 (Week 4): Pt will participate in bed<>w/c transfers 50% of the time with max cuing.  Skilled Therapeutic Interventions/Progress Updates:  Pt received in w/c with wife present & agreeable to tx. Pt reports he is expecting another grandchild & pt able to recall this information at end of session with questioning cuing. Pt reports "I'm hungry" so provided with magic cup where pt requires max assist hand over hand to self feed with pre loaded spoon and therapist ensuring a swallow between each bite. At times pt will perseverate & be unaware of no utensil in hand but still attempting to bring hand to mouth to self feed. After eating therapist provides total assist for oral hygiene, attempting to have pt engage in cleaning mouth but pt with poor attention to task. Pt's wife present to observe & occasionally asking questions. Pt able to locate therapist's hand L of midline to give a high five twice, which is an improvement, but with poor tracking with eyes to midline. Pt more talkative today, as well as easily distracted, with wife present. Therapist donned L hand splint & pt left in TIS w/c with chair alarm & lap tray donned, call bell in lap & wife present in room. Educated wife on inability to feed pt & to let staff know if he was still hungry so they could assist him with her voicing understanding.   Therapy Documentation Precautions:  Precautions Precautions: Fall Precaution Comments: Dense Lt hemi, pusher Restrictions Weight Bearing Restrictions: No  Pain: Pt with behaviors demonstrating pain in LUE when therapist attempts to extend  elbow - repositioning & rest provided for pain management.  Therapy/Group: Individual Therapy  [name] 08/02/2019, 12:26 PM

## 2019-08-03 ENCOUNTER — Inpatient Hospital Stay (HOSPITAL_COMMUNITY): Payer: Medicare Other

## 2019-08-03 ENCOUNTER — Inpatient Hospital Stay (HOSPITAL_COMMUNITY): Payer: Medicare Other | Admitting: Physical Therapy

## 2019-08-03 ENCOUNTER — Inpatient Hospital Stay (HOSPITAL_COMMUNITY): Payer: Medicare Other | Admitting: Speech Pathology

## 2019-08-03 DIAGNOSIS — Z66 Do not resuscitate: Secondary | ICD-10-CM

## 2019-08-03 DIAGNOSIS — R410 Disorientation, unspecified: Secondary | ICD-10-CM

## 2019-08-03 DIAGNOSIS — Z515 Encounter for palliative care: Secondary | ICD-10-CM

## 2019-08-03 NOTE — Plan of Care (Signed)
  Problem: Consults Goal: RH STROKE PATIENT EDUCATION Description: See Patient Education module for education specifics  Outcome: Progressing   Problem: RH BOWEL ELIMINATION Goal: RH STG MANAGE BOWEL WITH ASSISTANCE Description: STG Manage Bowel with MAX Assistance. Outcome: Progressing   Problem: RH BLADDER ELIMINATION Goal: RH STG MANAGE BLADDER WITH ASSISTANCE Description: STG Manage Bladder With MAX Assistance Outcome: Progressing   Problem: RH SAFETY Goal: RH STG ADHERE TO SAFETY PRECAUTIONS W/ASSISTANCE/DEVICE Description: STG Adhere to Safety Precautions With MIN Assistance/Device. Outcome: Progressing   Problem: RH PAIN MANAGEMENT Goal: RH STG PAIN MANAGED AT OR BELOW PT'S PAIN GOAL Description: AT OR BELOW LEVEL 4 Outcome: Progressing   Problem: RH KNOWLEDGE DEFICIT Goal: RH STG INCREASE KNOWLEDGE OF HYPERTENSION Description: Pt and wife will state understanding of HTN using medications, dietary restrictions, exercise and using handouts and educational resources independently Outcome: Progressing Goal: RH STG INCREASE KNOWLEDGE OF DYSPHAGIA/FLUID INTAKE Description: Pt and wife will state understanding of dysphagia using handouts and educational resources independently Outcome: Progressing Goal: RH STG INCREASE KNOWLEGDE OF HYPERLIPIDEMIA Description: Pt and wife will state understanding of HLD using medications, dietary restrictions, exercise and using handouts and educational resources independently Outcome: Progressing Goal: RH STG INCREASE KNOWLEDGE OF STROKE PROPHYLAXIS Description: Pt and wife will state understanding of secondary stroke using medications, dietary restrictions, exercise and using handouts and educational resources independently Outcome: Progressing   Problem: Consults Goal: RH STROKE PATIENT EDUCATION Description: See Patient Education module for education specifics  Outcome: Progressing   Problem: RH BOWEL ELIMINATION Goal: RH STG MANAGE  BOWEL WITH ASSISTANCE Description: STG Manage Bowel with MAX Assistance. Outcome: Progressing   Problem: RH BLADDER ELIMINATION Goal: RH STG MANAGE BLADDER WITH ASSISTANCE Description: STG Manage Bladder With MAX Assistance Outcome: Progressing   Problem: RH SAFETY Goal: RH STG ADHERE TO SAFETY PRECAUTIONS W/ASSISTANCE/DEVICE Description: STG Adhere to Safety Precautions With MIN Assistance/Device. Outcome: Progressing   Problem: RH PAIN MANAGEMENT Goal: RH STG PAIN MANAGED AT OR BELOW PT'S PAIN GOAL Description: AT OR BELOW LEVEL 4 Outcome: Progressing   Problem: RH KNOWLEDGE DEFICIT Goal: RH STG INCREASE KNOWLEDGE OF HYPERTENSION Description: Pt and wife will state understanding of HTN using medications, dietary restrictions, exercise and using handouts and educational resources independently Outcome: Progressing Goal: RH STG INCREASE KNOWLEDGE OF DYSPHAGIA/FLUID INTAKE Description: Pt and wife will state understanding of dysphagia using handouts and educational resources independently Outcome: Progressing Goal: RH STG INCREASE KNOWLEGDE OF HYPERLIPIDEMIA Description: Pt and wife will state understanding of HLD using medications, dietary restrictions, exercise and using handouts and educational resources independently Outcome: Progressing Goal: RH STG INCREASE KNOWLEDGE OF STROKE PROPHYLAXIS Description: Pt and wife will state understanding of secondary stroke using medications, dietary restrictions, exercise and using handouts and educational resources independently Outcome: Progressing

## 2019-08-03 NOTE — Progress Notes (Signed)
[facility] PHYSICAL MEDICINE & REHABILITATION PROGRESS NOTE   Subjective/Complaints: Eating breakfast. No new complaints  ROS: Patient denies fever, rash, sore throat, blurred vision, nausea, vomiting, diarrhea, cough, shortness of breath or chest pain, joint or back pain, headache, or mood change.    Objective:   No results found. No results for input(s): WBC, HGB, HCT, PLT in the last 72 hours. No results for input(s): NA, K, CL, CO2, GLUCOSE, BUN, CREATININE, CALCIUM in the last 72 hours.  Intake/Output Summary (Last 24 hours) at 08/03/2019 1038 Last data filed at 08/03/2019 0847 Gross per 24 hour  Intake 390 ml  Output -  Net 390 ml     Physical Exam: Vital Signs Blood pressure 104/66, pulse (!) 124, temperature 98.8 F (37.1 C), temperature source Oral, resp. rate 18, height 5\' 11"  (1.803 m), weight 58.8 kg, SpO2 96 %. Constitutional: No distress . Vital signs reviewed. HEENT: EOMI, oral membranes moist Neck: supple Cardiovascular: RRR without murmur. No JVD    Respiratory: CTA Bilaterally without wheezes or rales. Normal effort    GI: BS +, non-tender, non-distended  Musc: some tenderness with PROM of LUE and LLE. Neurologic: remains more alert. Tolerating feeding. Still with limited awareness.. Followed simple commands. .  Motor: RUE/RLE: Grossly 5/5  LUE/LLE: 0/5 Left neglect ongoing left upper flexor and lower ext tone 1-2/4, DTR's 3+  Psych: flat, cooperative   Assessment/Plan: 1. Functional deficits secondary to Right MCA infarct with dysphagia  which require 3+ hours per day of interdisciplinary therapy in a comprehensive inpatient rehab setting.  Physiatrist is providing close team supervision and 24 hour management of active medical problems listed below.  Physiatrist and rehab team continue to assess barriers to discharge/monitor patient progress toward functional and medical goals  Care Tool:  Bathing    Body parts bathed by patient: Face, Right arm,  Left arm, Abdomen, Chest, Front perineal area, Buttocks, Right upper leg, Left upper leg, Left lower leg, Right lower leg   Body parts bathed by helper: Right arm, Left arm, Abdomen, Front perineal area, Buttocks, Left upper leg, Right lower leg, Left lower leg, Chest, Right upper leg, Face     Bathing assist Assist Level: Total Assistance - Patient < 25%     Upper Body Dressing/Undressing Upper body dressing   What is the patient wearing?: Pull over shirt    Upper body assist Assist Level: Dependent - Patient 0%    Lower Body Dressing/Undressing Lower body dressing      What is the patient wearing?: Pants, Incontinence brief     Lower body assist Assist for lower body dressing: Dependent - Patient 0%     Toileting Toileting    Toileting assist Assist for toileting: Dependent - Patient 0%     Transfers Chair/bed transfer  Transfers assist     Chair/bed transfer assist level: Total Assistance - Patient < 25%     Locomotion Ambulation   Ambulation assist   Ambulation activity did not occur: Safety/medical concerns  Assist level: 2 helpers Assistive device: No Device Max distance: 10   Walk 10 feet activity   Assist  Walk 10 feet activity did not occur: Safety/medical concerns  Assist level: 2 helpers Assistive device: No Device   Walk 50 feet activity   Assist Walk 50 feet with 2 turns activity did not occur: Safety/medical concerns         Walk 150 feet activity   Assist Walk 150 feet activity did not occur: Safety/medical concerns  Walk 10 feet on uneven surface  activity   Assist Walk 10 feet on uneven surfaces activity did not occur: Safety/medical concerns         Wheelchair     Assist Will patient use wheelchair at discharge?: Yes Type of Wheelchair: Manual    Wheelchair assist level: Dependent - Patient 0%(TIS w/c) Max wheelchair distance: 150 ft    Wheelchair 50 feet with 2 turns  activity    Assist        Assist Level: Dependent - Patient 0%   Wheelchair 150 feet activity     Assist     Assist Level: Dependent - Patient 0%    Medical Problem List and Plan: 1.Dense lefthemiparesis, now with spasticitywith dysphagiasecondary to embolic shower right MCA occlusion status post reperfusion. Infarct felt to be embolic secondary to hypercoagulable state from lung cancer  -have spoken with wife as well as Dr. Julien Nordmann. Both are in favor of making sure he's comfortable. Further CTX is not planned at this time  -Appreciate palliative care assistance. I spoke with them today. Meeting with family to take place today  -continue therapies to tolerance 2. Antithrombotics: -DVT/anticoagulation/history of right lower extremity RPR:XYVOPFY -antiplatelet therapy: N/A 3. Pain Management/spastic left hemiparesis:Tylenol only as we're trying to avoid oversedation  - -antispasmodics   -have stopped baclofen as above---seems to have perked up a bit off medication   -orthotics  -kpad for back 4. Mood:Provide emotional support -antipsychotic agents: N/A  -holding ritalin 5. Neuropsych: This patientiscapable of making decisions on hisown behalf. 6. Skin/Wound Care:Routine skin checks 7. Fluids/Electrolytes/Nutrition:   -resumed IVF ---continue to support BP  -BUN/Cr stable on 8/6 8.Stage IV adenocarcinoma right lung. Chemotherapy as per [name]  -onc appts on temporary hold until after rehab discharge 9. Aspiration pneumonia, low grade temp.   Unasyn completed  WBCs 9.0 Aspiration precautions  CXR with persistent opacities, afebrile  -ucx only 10k  -blood cx NG 10.  Post stroke dysphagia.   D1honeys per MBS today 11. Hyperlipidemia. Crestor 12. Remote tobacco abuse. Counseling 13.Acute on chronic anemia.   Hemoglobin 8.6 on 8/1  Continue to monitor  14 Hypocalcemia-stable at 8.4 on 7/30  -likely  related to #8 15.     Tachycardia: has had since admit--110's to 120's---no real change  ECG  with sinus tachycardia  Unable to tolerate coreg d/t hypotension      LOS: 24 days A FACE TO FACE EVALUATION WAS PERFORMED  [name] 08/03/2019, 10:38 AM

## 2019-08-03 NOTE — Progress Notes (Signed)
Physical Therapy Session Note  Patient Details  Name: Kyle Garcia MRN: [id number] Date of Birth: 06/07/1951  Today's Date: 08/03/2019 PT Individual Time: 1004-1058 PT Individual Time Calculation (min): 54 min   Short Term Goals: Week 4:  PT Short Term Goal 1 (Week 4): Pt will participate in bed mobliity tasks 50% of the time with max cuing. PT Short Term Goal 2 (Week 4): Pt will participate in bed<>w/c transfers 50% of the time with max cuing.  Skilled Therapeutic Interventions/Progress Updates:  Pt received in bed & agreeable to tx. Therapist provides dependent assist for donning shirt, shorts, socks & shoes from bed level. Pt rolls L<>R with total assist to allow therapist to pull pants over hips. Pt is able to assist with pushing from R sidelying>sitting EOB with max cuing. Pt transfers bed>w/c with dependent assist +2. Transported pt to gym via w/c dependent assist & pt transfers TIS<>mat table with slide board +2 assist. Pt briefly (<5 seconds) able to hold self upright with min assist for sitting balance, then requires max<>total assist throughout remainder of time on EOM. While on EOM attempted to have pt engage with palliative nurse who stopped by to visit him, but pt unable to track or focus eyes to her, and did not initiate holding head upright as he maintains fully flexed position. Therapist provides total assist for holding head up with pt beginning to resist as time passes, to return to flexed posture. Pt frequently pulling himself forward and demonstrating anterior LOB with pt unaware & max/total assist to correct. Attempted to engage pt in locating, reaching, tossing objects placed in R visual field with pt only engaging 20% of the time. Back in room, while set up at sink, therapist placed washcloth in front of pt & instructed him to wash face with pt initiating & performing task with RUE. Therapist donned LUE hand splint (pt with c/o pain when therapist extends L elbow with  repositioning provided). Pt left in TIS w/c with lap tray & chair alarm donned, call bell in lap.   Therapy Documentation Precautions:  Precautions Precautions: Fall Precaution Comments: Dense Lt hemi, pusher Restrictions Weight Bearing Restrictions: No  Pain: Pt with behaviors demonstrating pain but unsure of specific location (assume pt's back/legs) with bed mobility with repositioning & rest provided for comfort.    Therapy/Group: Individual Therapy  [name] 08/03/2019, 11:55 AM

## 2019-08-03 NOTE — Progress Notes (Signed)
Speech Language Pathology Daily Session Note  Patient Details  Name: Friend Dorfman MRN: [id number] Date of Birth: 07/18/1951  Today's Date: 08/03/2019 SLP Individual Time: 0910-0950 SLP Individual Time Calculation (min): 40 min  Short Term Goals: Week 4: SLP Short Term Goal 1 (Week 4): Given Total A cues, pt will consume current diet with minimal overt s/s of aspiration. SLP Short Term Goal 2 (Week 4): Patient will demonstrate sustained attention to functional tasks for 5 minutes with Max A verbal cues for redirection. SLP Short Term Goal 3 (Week 4): Given Max A cues, pt will follow 1 step basic directions in 5 out of 10 opportunities. SLP Short Term Goal 4 (Week 4): Given Max A cues, patient will initiate functional tasks in 5 out of 10 opportunities.  Skilled Therapeutic Interventions: Skilled treatment session focused on cognitive goals. Upon arrival, patient was lethargic and required Max verbal cues for arousal and sustained attention.  SLP facilitated session by providing hand over hand assist to initiate functional tasks like washing his face and oral care via the suction toothbrush. When SLP attempted to remove hand over hand assist, the patient would immediately terminate all tasks. Patient also required total A for initiation during a basic calendar making task. Patient left upright in bed with alarm on and all needs within reach. Continue with current plan of care.      Pain Pain Assessment Pain Scale: 0-10 Pain Score: 0-No pain Faces Pain Scale: No hurt  Therapy/Group: Individual Therapy  Janaki Exley 08/03/2019, 12:31 PM

## 2019-08-03 NOTE — Progress Notes (Signed)
Nutrition Follow-up  DOCUMENTATION CODES:   Underweight, Severe malnutrition in context of chronic illness  INTERVENTION:   - Magic cup TID with meals, each supplement provides 290 kcal and 9 grams of protein  - Continue MVI with minerals  NUTRITION DIAGNOSIS:   Severe Malnutrition related to chronic illness (stage IV lung cancer, recurrent strokes, dysphagia) as evidenced by severe fat depletion, severe muscle depletion, percent weight loss (7.2% weight loss in less than 1 month).  New diagnosis after completion of NFPE  GOAL:   Patient will meet greater than or equal to 90% of their needs  Progressing  MONITOR:   PO intake, Supplement acceptance, Labs, Weight trends, Diet advancement, I & O's, Skin  REASON FOR ASSESSMENT:   Malnutrition Screening Tool    ASSESSMENT:   68 y/o male HLD, GERD, Stage IV lung cancer and recurrent strokes. Presents to CIR following hospitalization 1/61-0/96 for embolic shower and R MCA occlusion s/p attempted mechanical thrombectomy of multiple vessels.  7/31 - diet advanced to Dysphagia 2 with nectar-thick liquids 8/06 - diet downgraded to Dysphagia 1, honey-thick liquids  Reviewed Palliative Care note from today. Per note, the family felt that Hospice "seemed to be the best fit but they weren't quite ready to commit to a decision today." RD will follow along for further [place] discussions.  RD met briefly with pt at bedside. Pt answered RD questions appropriately. Pt reports that he does have a good appetite and is "trying to eat."  Weight down a total of 10 lbs since admission to CIR. This is a 7.2% weight loss in less than 1 month which is significant for timeframe.  Meal Completion: 45-90% x last 8 recorded meals (averaging ~70%)  Medications reviewed and include: Oscal with D, folic acid, MVI with minerals IVF: NS @ 50 ml/hr q HS  Labs reviewed: sodium 134, hemoglobin 9.1  NUTRITION - FOCUSED PHYSICAL EXAM:    Most Recent Value   Orbital Region  Severe depletion  Upper Arm Region  Severe depletion  Thoracic and Lumbar Region  Severe depletion  Buccal Region  Severe depletion  Temple Region  Severe depletion  Clavicle Bone Region  Severe depletion  Clavicle and Acromion Bone Region  Severe depletion  Scapular Bone Region  Moderate depletion  Dorsal Hand  Moderate depletion  Patellar Region  Moderate depletion  Anterior Thigh Region  Severe depletion  Posterior Calf Region  Severe depletion  Edema (RD Assessment)  None  Hair  Reviewed  Eyes  Reviewed  Mouth  Reviewed  Skin  Reviewed  Nails  Reviewed       Diet Order:   Diet Order            DIET - DYS 1 Room service appropriate? Yes; Fluid consistency: Honey Thick  Diet effective now              EDUCATION NEEDS:   No education needs have been identified at this time  Skin:  Skin Assessment: Reviewed RN Assessment (MASD to groin)  Last BM:  08/03/19  Height:   Ht Readings from Last 1 Encounters:  07/20/19 _0  (1.803 m)    Weight:   Wt Readings from Last 1 Encounters:  08/01/19 58.8 kg    Ideal Body Weight:  78.2 kg  BMI:  Body mass index is 18.08 kg/m.  Estimated Nutritional Needs:   Kcal:  2000-2200  Protein:  100-115 grams  Fluid:  >/= 2.0 L    Gaynell Face, MS, RD,  LDN Inpatient Clinical Dietitian Pager: [phone] Weekend/After Hours: [phone]

## 2019-08-03 NOTE — Consult Note (Signed)
Consultation Note Date: 08/03/2019   Patient Name: Kyle Garcia  DOB: Mar 02, 1951  MRN: [id number]  Age / Sex: 68 y.o., male  PCP: Kyle Frizzle, MD Referring Physician: [name], MD  Reason for Consultation: Establishing goals of care  HPI/Patient Profile: 68 y.o. male  with past medical history of stg 4 [place] lung CA in the right lung with mets and lymphadenopathy to the mediastinal, supraclavicular, and bilateral hilar areas, as well as DVT on anticoagulation, and 41-74 micro embolic strokes in June of 2020 who was admitted on 07/10/2019 with right MCA CVA.  Imaging revealed multiple areas of infarction.  He underwent thrombectomy on 7/10 and on re-imaging 8/5 the stroke showed significant progression.  The patient has been in CIR for over 3 weeks and unfortunate is not progressing.  His mental status waxes and wanes, he aspirates and requires a D1/honey thick diet.  He has required IVF at night for hydration and BP support.  He remains tachycardic (120s) but can not tolerate beta blockers due to hypotension.  He is having increase spasticity and pain in his legs but his pain medications (baclofen) have been decreased in order to help him become more alert.  Dr. Naaman Garcia noted to me that Kyle Garcia has had multiple strokes on anticoagulation.  Unfortunately is it not a matter of if, but rather when he is going to have another stroke.  He is unable to undergo further chemotherapy due to poor functional status.  Per Epic notes Dr. Naaman Garcia and Dr. Julien Garcia are supportive of Kyle Garcia focusing on comfort and quality of life.  Clinical Assessment and Goals of Care:  I have reviewed medical records including EPIC notes, labs and imaging, received report from the Rehabilitation Team assessed the patient and then met at the bedside along with his wife and 4 children on the phone to discuss diagnosis prognosis,  GOC, EOL wishes, disposition and options.  I introduced Palliative Medicine as specialized medical care for people living with serious illness. It focuses on providing relief from the symptoms and stress of a serious illness. The goal is to improve quality of life for both the patient and the family.  We discussed a brief life review of the patient. Kyle Garcia is from [place].  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  He is very analytical in his thinking.  He is married to Kyle Garcia.  They have a close family.  1 of his daughters (and her 4 children) are planning to move back into the family home to help care for Kyle Garcia when he comes home.  Kyle Garcia has faith in God.  The family is excitedly expecting a new grandchild from 73 son Kyle Garcia.  As far as functional and nutritional status he is completely dependent.  With hoyer lift and two person assist he can get out of bed and sit in a supported chair (with a tray holding him in).  He is unable to hold the phone in his hand to speak with his wife.  As he speaks to me he aspirates on his own saliva and coughs repeatedly.  He is alert and attempts to answer questions, but he tires and sometimes does not make sense.  We discussed his current illness and what it means in the larger context of his on-going co-morbidities.  Natural disease trajectory and expectations at EOL were discussed.  I attempted to elicit values and goals of care important to the patient.  According to Kyle Garcia's wife and children this is absolutely not the type of life Kyle Garcia would want.  Per Kyle Garcia would tell us to take him in to woods and shoot him if he was ever like this".  We talked at length about Quality of life vs Quantity of life.  We discussed SNF vs Home.  We discussed Home with Hospice vs Home with PT/OT/SLP and Palliative.  The family felt that Hospice seemed to be the best fit but they weren't quite ready to commit to a decision today.  They asked me to obtain Kyle Garcia opinion.  Kyle Garcia  had difficulty understanding the question - he asked "Can you give me a synopsis of the two options?"  Which I did and his daughter did again.  Then Kyle Garcia asked "Can you talk to me about the pros and cons?".  Finally Kyle Garcia stated "It's too much to process.  My little processor ain't what it used to be".  I felt asking Kyle Garcia for input was too much for him.  I reassured that family that no decision is set in stone - they can try one on for size and if it doesn't fit they can change direction in a week.    At the end of the conversation I asked Kyle Garcia about code status she indicated that she had already discussed this with her family and that he was not to be resuscitated.    The family was very grateful for the information.  I let them know that someone from the Palliative team would follow up with them tomorrow to answer questions and I gave them the office number.  Primary Decision Maker:  NEXT OF KIN Wife Kyle Garcia.    SUMMARY OF RECOMMENDATIONS    Change code status to DNR and please ensure he has a GOLD DNR form when he discharges. PMT will continue to follow with you and the family to define next steps I feel this patient and family would benefit most from Hospice in their home.  Code Status/Advance Care Planning:  DNR   Symptom Management:  Would consider adding back low dose baclofen PRN spasm, but will defer to the attending team.   Palliative Prophylaxis:   Aspiration and Frequent Pain Assessment  Psycho-social/Spiritual:   Desire for further Chaplaincy support:  Not discussed.  But the family did indicate that they felt the course of his life is in God's hands.  Prognosis:   Unable to determine but he is at high risk for decompensation from another acute stroke or hypovolemia and hypotension.  Discharge Planning: To Be Determined.  Likely home with hospice vs home with home health and palliative.   The patient will need a hospital bed, hoyer lift, and additional equipment in the  home.      Primary Diagnoses: Present on Admission: . Right middle cerebral artery stroke ([place])   I have reviewed the medical record, interviewed the patient and family, and examined the patient. The following aspects are pertinent.  Past Medical History:  Diagnosis Date  . Cancer ([place])  stage 4 adenocarcinoma right lung  . GERD (gastroesophageal reflux disease)   . Hyperlipidemia   . Stroke [facility])    25-30 emboli seen on screening MRI   Social History   Socioeconomic History  . Marital status: Married    Spouse name: Not on file  . Number of children: Not on file  . Years of education: Not on file  . Highest education level: Not on file  Occupational History  . Not on file  Social Needs  . Financial resource strain: Not on file  . Food insecurity    Worry: Not on file    Inability: Not on file  . Transportation needs    Medical: Not on file    Non-medical: Not on file  Tobacco Use  . Smoking status: Former Smoker    Types: Cigars    Quit date: 02/07/2019    Years since quitting: 0.4  . Smokeless tobacco: Never Used  . Tobacco comment: occassional cigar/stopped ciggs in 1981; as of 04/28/2019, rarely smokes  Substance and Sexual Activity  . Alcohol use: Yes    Alcohol/week: 4.0 - 7.0 standard drinks    Types: 1 - 3 Glasses of wine, 3 - 4 Cans of beer per week  . Drug use: Never  . Sexual activity: Not on file  Lifestyle  . Physical activity    Days per week: Not on file    Minutes per session: Not on file  . Stress: Not on file  Relationships  . Social Herbalist on phone: Not on file    Gets together: Not on file    Attends religious service: Not on file    Active member of club or organization: Not on file    Attends meetings of clubs or organizations: Not on file    Relationship status: Not on file  Other Topics Concern  . Not on file  Social History Narrative  . Not on file   History reviewed. No pertinent family history. Scheduled  Meds: . apixaban  5 mg Oral BID  . calcium-vitamin D  2 tablet Oral BID  . folic acid  1 mg Oral Daily  . lidocaine  1 application Urethral Once  . mouth rinse  15 mL Mouth Rinse BID  . multivitamin with minerals  1 tablet Oral Daily  . rosuvastatin  20 mg Oral q1800   Continuous Infusions: . sodium chloride 50 mL/hr at 08/02/19 2257   PRN Meds:.acetaminophen **OR** acetaminophen (TYLENOL) oral liquid 160 mg/5 mL, Resource ThickenUp Clear No Known Allergies Review of Systems Not discussed with patient as he has difficulty focusing to answer questions.  Physical Exam  Thin frail male,  Hunched over while working with PT, but very alert, attempting to answer my questions. CV tachy reg rhythm resp no distress Abdomen thin, non-distended. Dense left hemi paresis, weakness on the right.  Vital Signs: BP 104/66 (BP Location: Left Arm)   Pulse (!) 124   Temp 98.8 F (37.1 C) (Oral)   Resp 18   Ht _0  (1.803 m)   Wt 58.8 kg   SpO2 96%   BMI 18.08 kg/m  Pain Scale: 0-10   Pain Score: 0-No pain   SpO2: SpO2: 96 % O2 Device:SpO2: 96 % O2 Flow Rate: .   IO: Intake/output summary:   Intake/Output Summary (Last 24 hours) at 08/03/2019 1157 Last data filed at 08/03/2019 0847 Gross per 24 hour  Intake 390 ml  Output -  Net 390  ml    LBM: Last BM Date: 08/02/19 Baseline Weight: Weight: 63.4 kg Most recent weight: Weight: 58.8 kg     Palliative Assessment/Data: 30%     Time In: 10:00 Time Out: 12:00 Time Total: 2 hours. Visit consisted of counseling and education dealing with the complex and emotionally intense issues surrounding the need for palliative care and symptom management in the setting of serious and potentially life-threatening illness. Greater than 50%  of this time was spent counseling and coordinating care related to the above assessment and plan.  Signed by: Florentina Jenny, PA-C Palliative Medicine Pager: [phone]  Please contact Palliative  Medicine Team phone at [phone] for questions and concerns.  For individual provider: See Shea Evans

## 2019-08-03 NOTE — Progress Notes (Signed)
Occupational Therapy Session Note  Patient Details  Name: Kyle Garcia MRN: [id number] Date of Birth: Feb 01, 1951  Today's Date: 08/03/2019 OT Individual Time: 1340-1450 OT Individual Time Calculation (min): 70 min    Session 2:  OT Individual Time: 4270-6237 OT Individual Time Calculation (min): 10 min   Short Term Goals: Week 3:  OT Short Term Goal 1 (Week 3): Pt will initiate bathing one body part with mod vc OT Short Term Goal 2 (Week 3): Pt will attend to one grooming task with mod vc OT Short Term Goal 3 (Week 3): Pt will don shirt with max A  Skilled Therapeutic Interventions/Progress Updates:    Session 1: pt received in TIS w/c agreeable to shower, no c/o pain initially. Pt completed stand pivot transfer to roll in shower chair with max A. Pt initially demonstrated improved postural control and was able to maintain upright posture with close (S). Pt abruptly began c/o back pain (Spasm) and assumed full flexion position, requiring total A to stay in shower chair. Total A +2 quickly provided for hygiene and bathing, as pt had BM in shower. Pt unable to regain upright posture with total A. Pt required total A to dry off and then to don shirt/pants. Pt was transferred back into TIS and reclined and back pain alleviated. Total A provided for shaving, per pt request. Pt able to follow several commands briefly during shaving, including lifting head and turning to the side. Pt was taken out of room for environment change and given a cup of thickened coffee (via teaspoon), max A. Mod cueing for swallow. Pt was left sitting up in the TIS w/c with the seatbelt and chair alarm belt fastened.   Session 2:  Pt received supine, c/o pain in back. Reports from RN include that pt had increased lethargy and fatigue and was just recently transferred back to bed. Pt completed rolling R with max A for heat pad to be placed for pain management. Pt reported heat alleviated some of his pain. Pt tolerated PROM  to his LUE to maintain muscle elasticity and length. Pt left supine with all needs met, bed alarm set.   Therapy Documentation Precautions:  Precautions Precautions: Fall Precaution Comments: Dense Lt hemi, pusher Restrictions Weight Bearing Restrictions: No   Therapy/Group: Individual Therapy  [name] 08/03/2019, 7:01 AM

## 2019-08-04 ENCOUNTER — Inpatient Hospital Stay (HOSPITAL_COMMUNITY): Payer: Medicare Other | Admitting: Speech Pathology

## 2019-08-04 ENCOUNTER — Inpatient Hospital Stay (HOSPITAL_COMMUNITY): Payer: Medicare Other | Admitting: Occupational Therapy

## 2019-08-04 ENCOUNTER — Inpatient Hospital Stay (HOSPITAL_COMMUNITY): Payer: Medicare Other

## 2019-08-04 NOTE — Plan of Care (Signed)
Several goals downgraded and d/c d/t lack of pt progress and change in goals of care  Problem: Sit to Stand Goal: LTG:  Patient will perform sit to stand in prep for activites of daily living with assistance level (OT) Description: LTG:  Patient will perform sit to stand in prep for activites of daily living with assistance level (OT) Outcome: Not Applicable Flowsheets (Taken 08/04/2019 1031) LTG: PT will perform sit to stand in prep for activites of daily living with assistance level: (goal d/c 8/11) --   Problem: Sit to Stand Goal: LTG:  Patient will perform sit to stand in prep for activites of daily living with assistance level (OT) Description: LTG:  Patient will perform sit to stand in prep for activites of daily living with assistance level (OT) Outcome: Not Applicable Flowsheets (Taken 08/04/2019 1031) LTG: PT will perform sit to stand in prep for activites of daily living with assistance level: (goal d/c 8/11) --   Problem: RH Eating Goal: LTG Patient will perform eating w/assist, cues/equip (OT) Description: LTG: Patient will perform eating with assist, with/without cues using equipment (OT) Flowsheets (Taken 08/04/2019 1031) LTG: Pt will perform eating with assistance level of: (goal downgraded 8/11) Maximal Assistance - Patient 25 - 49%   Problem: RH Grooming Goal: LTG Patient will perform grooming w/assist,cues/equip (OT) Description: LTG: Patient will perform grooming with assist, with/without cues using equipment (OT) Flowsheets (Taken 08/04/2019 1031) LTG: Pt will perform grooming with assistance level of: (goal downgraded 8/11) Maximal Assistance - Patient 25 - 49%   Problem: RH Bathing Goal: LTG Patient will bathe all body parts with assist levels (OT) Description: LTG: Patient will bathe all body parts with assist levels (OT) Flowsheets (Taken 08/04/2019 1031) LTG: Pt will perform bathing with assistance level/cueing: (goal downgraded 8/11) Maximal Assistance - Patient  25 - 49%   Problem: RH Dressing Goal: LTG Patient will perform upper body dressing (OT) Description: LTG Patient will perform upper body dressing with assist, with/without cues (OT). Flowsheets (Taken 08/04/2019 1031) LTG: Pt will perform upper body dressing with assistance level of: (goal downgraded 8/11) Maximal Assistance - Patient 25 - 49% Goal: LTG Patient will perform lower body dressing w/assist (OT) Description: LTG: Patient will perform lower body dressing with assist, with/without cues in positioning using equipment (OT) Outcome: Not Applicable Flowsheets (Taken 08/04/2019 1031) LTG: Pt will perform lower body dressing with assistance level of: (goal d/c 8/11) --   Problem: RH Toileting Goal: LTG Patient will perform toileting task (3/3 steps) with assistance level (OT) Description: LTG: Patient will perform toileting task (3/3 steps) with assistance level (OT)  Outcome: Not Applicable Flowsheets (Taken 08/04/2019 1031) LTG: Pt will perform toileting task (3/3 steps) with assistance level: (goal d/c 8/11) --   Problem: RH Toilet Transfers Goal: LTG Patient will perform toilet transfers w/assist (OT) Description: LTG: Patient will perform toilet transfers with assist, with/without cues using equipment (OT) Outcome: Not Applicable Flowsheets (Taken 08/04/2019 1031) LTG: Pt will perform toilet transfers with assistance level of: (goal d/c 8/11) --   Problem: RH Tub/Shower Transfers Goal: LTG Patient will perform tub/shower transfers w/assist (OT) Description: LTG: Patient will perform tub/shower transfers with assist, with/without cues using equipment (OT) Outcome: Not Applicable Flowsheets (Taken 08/04/2019 1031) LTG: Pt will perform tub/shower stall transfers with assistance level of: (goal d/c 8/11) --   Problem: RH Attention Goal: LTG Patient will demonstrate this level of attention during functional activites (OT) Description: LTG:  Patient will demonstrate this level of  attention during  functional activites  (OT) Flowsheets (Taken 08/04/2019 1034) Patient will demonstrate this level of attention during functional activites: Focused Patient will demonstrate above attention level in the following environment: Controlled LTG: Patient will demonstrate this level of attention during functional activites (OT): Maximal Assistance - Patient 25 - 49%   Problem: RH Awareness Goal: LTG: Patient will demonstrate awareness during functional activites type of (OT) Description: LTG: Patient will demonstrate awareness during functional activites type of (OT) Flowsheets (Taken 08/04/2019 1034) Patient will demonstrate awareness during functional activites type of: Intellectual LTG: Patient will demonstrate awareness during functional activites type of (OT): Maximal Assistance - Patient 25 - 49%   Problem: RH Pre-functional/Other (Specify) Goal: RH LTG OT (Specify) 1 Description: RH LTG OT (Specify) 1 Flowsheets (Taken 08/04/2019 1034) LTG: Other OT (Specify) 1: Pt will tolerate PROM to LUE to reduce contracture risk and increase muscle length Goal: RH LTG OT (Specify) 2 Description: RH LTG OT (Specify) 2  Flowsheets (Taken 08/04/2019 1034) LTG: Other OT (Specify) 2: Pt will tolerate rolling R and L with total A for reduced caregiver burden with bed level care

## 2019-08-04 NOTE — Progress Notes (Signed)
Occupational Therapy Session Note  Patient Details  Name: Kyle Garcia MRN: [id number] Date of Birth: November 30, 1951  Today's Date: 08/04/2019 OT Individual Time: 1120-1200 OT Individual Time Calculation (min): 40 min    Short Term Goals: Week 4:  OT Short Term Goal 1 (Week 4): STG= LTG d/t ELOS  Skilled Therapeutic Interventions/Progress Updates:    Pt received in TIS w/c with no initial c/o pain. Pt given cup of honey thickened coffee, via teaspoon, total A. Pt required no cueing for swallowing, however had increased oral holding time. Pt pulling at brief however unable to state if he needed to use bathroom or not. Pt completed sit <> stand with total A +2. Total A +2 for stand pivot to shower chair and for hygiene. Pt returned to TIS w/c. B hand hygiene at sink with total A. Pt left sitting up with all needs met, chair alarm and lap tray fastened.   Therapy Documentation Precautions:  Precautions Precautions: Fall Precaution Comments: Dense Lt hemi, pusher Restrictions Weight Bearing Restrictions: No   Therapy/Group: Individual Therapy  [name] 08/04/2019, 10:38 AM

## 2019-08-04 NOTE — Progress Notes (Signed)
[facility] PHYSICAL MEDICINE & REHABILITATION PROGRESS NOTE   Subjective/Complaints: Up in bed. No new complaints. Told me he doesn't have any pain  ROS: Limited due to cognitive/behavioral   Objective:   No results found. No results for input(s): WBC, HGB, HCT, PLT in the last 72 hours. No results for input(s): NA, K, CL, CO2, GLUCOSE, BUN, CREATININE, CALCIUM in the last 72 hours.  Intake/Output Summary (Last 24 hours) at 08/04/2019 1231 Last data filed at 08/04/2019 0843 Gross per 24 hour  Intake 940 ml  Output -  Net 940 ml     Physical Exam: Vital Signs Blood pressure 111/75, pulse (!) 123, temperature 98.3 F (36.8 C), resp. rate 20, height 5\' 11"  (1.803 m), weight 58.8 kg, SpO2 96 %. Constitutional: No distress . Vital signs reviewed. HEENT: EOMI, oral membranes moist Neck: supple Cardiovascular: RRR without murmur. No JVD    Respiratory: CTA Bilaterally without wheezes or rales. Normal effort    GI: BS +, non-tender, non-distended  Musc: some tenderness with PROM of LUE and LLE. Neurologic: remains quite alert. But little insight and awareness. Oriented to person hospital.  Motor: RUE/RLE: Grossly 5/5  LUE/LLE: 0/5 Left neglect ongoing left upper flexor and lower ext tone 1-2/4, DTR's 3+  Psych: flat,   Assessment/Plan: 1. Functional deficits secondary to Right MCA infarct with dysphagia  which require 3+ hours per day of interdisciplinary therapy in a comprehensive inpatient rehab setting.  Physiatrist is providing close team supervision and 24 hour management of active medical problems listed below.  Physiatrist and rehab team continue to assess barriers to discharge/monitor patient progress toward functional and medical goals  Care Tool:  Bathing    Body parts bathed by patient: Face, Right arm, Left arm, Abdomen, Chest, Front perineal area, Buttocks, Right upper leg, Left upper leg, Left lower leg, Right lower leg   Body parts bathed by helper: Right arm,  Left arm, Abdomen, Front perineal area, Buttocks, Left upper leg, Right lower leg, Left lower leg, Chest, Right upper leg, Face     Bathing assist Assist Level: Total Assistance - Patient < 25%     Upper Body Dressing/Undressing Upper body dressing   What is the patient wearing?: Pull over shirt    Upper body assist Assist Level: Dependent - Patient 0%    Lower Body Dressing/Undressing Lower body dressing      What is the patient wearing?: Pants, Incontinence brief     Lower body assist Assist for lower body dressing: Dependent - Patient 0%     Toileting Toileting    Toileting assist Assist for toileting: Dependent - Patient 0%     Transfers Chair/bed transfer  Transfers assist     Chair/bed transfer assist level: Total Assistance - Patient < 25%     Locomotion Ambulation   Ambulation assist   Ambulation activity did not occur: Safety/medical concerns  Assist level: 2 helpers Assistive device: No Device Max distance: 10   Walk 10 feet activity   Assist  Walk 10 feet activity did not occur: Safety/medical concerns  Assist level: 2 helpers Assistive device: No Device   Walk 50 feet activity   Assist Walk 50 feet with 2 turns activity did not occur: Safety/medical concerns         Walk 150 feet activity   Assist Walk 150 feet activity did not occur: Safety/medical concerns         Walk 10 feet on uneven surface  activity   Assist Walk 10  feet on uneven surfaces activity did not occur: Safety/medical concerns         Wheelchair     Assist Will patient use wheelchair at discharge?: Yes Type of Wheelchair: Manual    Wheelchair assist level: Dependent - Patient 0%(TIS w/c) Max wheelchair distance: 150 ft    Wheelchair 50 feet with 2 turns activity    Assist        Assist Level: Dependent - Patient 0%   Wheelchair 150 feet activity     Assist     Assist Level: Dependent - Patient 0%    Medical Problem  List and Plan: 1.Dense lefthemiparesis, now with spasticitywith dysphagiasecondary to embolic shower right MCA occlusion status post reperfusion. Infarct felt to be embolic secondary to hypercoagulable state from lung cancer  -have spoken with wife as well as Dr. Julien Nordmann. Both are in favor of making sure he's comfortable. Further CTX is not planned at this time  -Appreciate palliative care assistance. Family deciding upon next step  -continue therapies to tolerance 2. Antithrombotics: -DVT/anticoagulation/history of right lower extremity XHF:SFSELTR -antiplatelet therapy: N/A 3. Pain Management/spastic left hemiparesis:Tylenol only as we're trying to avoid oversedation  - -antispasmodics   -have stopped baclofen as above---seems to have perked up a bit off medication   -orthotics  -kpad for back 4. Mood:Provide emotional support -antipsychotic agents: N/A  -holding ritalin 5. Neuropsych: This patientiscapable of making decisions on hisown behalf. 6. Skin/Wound Care:Routine skin checks 7. Fluids/Electrolytes/Nutrition:   -resumed IVF ---continue to support BP  -BUN/Cr stable on 8/6 8.Stage IV adenocarcinoma right lung. Chemotherapy as per [name]  -onc appts on temporary hold until after rehab discharge 9. Aspiration pneumonia, low grade temp.   Unasyn completed  WBCs 9.0 Aspiration precautions  CXR with persistent opacities, afebrile  -ucx only 10k  -blood cx NG 10.  Post stroke dysphagia.   D1honeys per MBS today 11. Hyperlipidemia. Crestor 12. Remote tobacco abuse. Counseling 13.Acute on chronic anemia.   Hemoglobin 8.6 on 8/1  Continue to monitor  14 Hypocalcemia-stable at 8.4 on 7/30  -likely related to #8 15.     Tachycardia: has had since admit--110's to 120's---no real change  At baseline      LOS: 25 days A FACE TO [place] 08/04/2019, 12:31 PM

## 2019-08-04 NOTE — Progress Notes (Signed)
Physical Therapy Session Note  Patient Details  Name: Kyle Garcia MRN: [id number] Date of Birth: 1951/03/15  Today's Date: 08/04/2019 PT Individual Time: 4599-7741 PT Individual Time Calculation (min): 58 min   Short Term Goals: Week 4:  PT Short Term Goal 1 (Week 4): Pt will participate in bed mobliity tasks 50% of the time with max cuing. PT Short Term Goal 2 (Week 4): Pt will participate in bed<>w/c transfers 50% of the time with max cuing.  Skilled Therapeutic Interventions/Progress Updates:  Pt received in TIS w/c, alert. Transported pt to/from dayroom via w/c dependent assist for time management. Pt transfers w/c<>mat table via slide board +2 assist. Attempted to engage pt in playing corn hole with therapist attempting to place bean bag in pt's hand with pt demonstrating inconsistent ability to maintain hold on bag and inconsistent ability to toss it with pt not appearing to be aware of where board is. Pt engaged in kicking beachball 10x with RLE with cuing to count repetitions & mod/max assist to sustain attention to task. Attempted to have pt tap ball back to therapist with RUE with pt requiring max cuing & tossing ball at him several times before pt engaged in task. Pt requires max<>total assist for static sitting balance EOM 2/2 pt fully flexing forward or pushing backwards & requiring assistance to scoot hips back on EOM. Assisted pt to supine and therapist performed lumbar trunk rotation with B knees flexed & total assist to maintain this position, in attempts to stretch pt's back out. Pt appears to have more pain on R side as he moves RLE to reposition for increased comfort. Attempted to perform LUE elbow extension stretch as pt tolerated. Attempted to assist pt with sit>stand +2 total assist but pt unable to keep either BLE on floor and attend to task to assist with sit>stand. Pt continues to fully flex forward with impaired awareness of this. At end of session pt left in TIS w/c with  seat belt & lap tray donned & in handoff to SLP.  Therapy Documentation Precautions:  Precautions Precautions: Fall Precaution Comments: Dense Lt hemi, pusher Restrictions Weight Bearing Restrictions: No  Pain: Pt with ongoing behaviors & c/o pain in back with therapist attempting stretching & rest breaks & repositioning provided during session.    Therapy/Group: Individual Therapy  [name] 08/04/2019, 3:49 PM

## 2019-08-04 NOTE — Progress Notes (Signed)
Speech Language Pathology Daily Session Note  Patient Details  Name: Kyle Garcia MRN: [id number] Date of Birth: 01-10-1951  Today's Date: 08/04/2019 SLP Individual Time: 1400-1440 SLP Individual Time Calculation (min): 40 min  Short Term Goals: Week 4: SLP Short Term Goal 1 (Week 4): Given Total A cues, pt will consume current diet with minimal overt s/s of aspiration. SLP Short Term Goal 2 (Week 4): Patient will demonstrate sustained attention to functional tasks for 5 minutes with Max A verbal cues for redirection. SLP Short Term Goal 3 (Week 4): Given Max A cues, pt will follow 1 step basic directions in 5 out of 10 opportunities. SLP Short Term Goal 4 (Week 4): Given Max A cues, patient will initiate functional tasks in 5 out of 10 opportunities.  Skilled Therapeutic Interventions: Skilled treatment session focused on cognitive goals. SLP facilitated session by providing Max A verbal cues for orientation to time and situation. Patient with increased language of confusion this session but was easily redirected. Patient also participated in a basic problem solving task that focused on identifying and sequencing numbers. Patient unable to attend to task despite Max A multimodal cues and required total A for completion. Patient requested to use the bathroom and was transferred to the bed via the [facility] with +2 assist for safety. Patient placed on the bedpan but was unable to have a bowel movement, suspect decreased attention to task impacted function. Patient left supine in bed with alarm on and all needs within reach. Continue with current plan of care.      Pain No/Denies Pain   Therapy/Group: Individual Therapy  Teighan Aubert 08/04/2019, 2:43 PM

## 2019-08-04 NOTE — Plan of Care (Signed)
  Problem: Consults Goal: RH STROKE PATIENT EDUCATION Description: See Patient Education module for education specifics  Outcome: Progressing   Problem: RH SAFETY Goal: RH STG ADHERE TO SAFETY PRECAUTIONS W/ASSISTANCE/DEVICE Description: STG Adhere to Safety Precautions With MIN Assistance/Device. Outcome: Progressing   Problem: RH PAIN MANAGEMENT Goal: RH STG PAIN MANAGED AT OR BELOW PT'S PAIN GOAL Description: AT OR BELOW LEVEL 4 Outcome: Progressing   Problem: RH BOWEL ELIMINATION Goal: RH STG MANAGE BOWEL WITH ASSISTANCE Description: STG Manage Bowel with MAX Assistance. Outcome: Not Progressing   Problem: RH BLADDER ELIMINATION Goal: RH STG MANAGE BLADDER WITH ASSISTANCE Description: STG Manage Bladder With MAX Assistance Outcome: Not Progressing   Problem: RH KNOWLEDGE DEFICIT Goal: RH STG INCREASE KNOWLEDGE OF HYPERTENSION Description: Pt and wife will state understanding of HTN using medications, dietary restrictions, exercise and using handouts and educational resources independently Outcome: Not Progressing Goal: RH STG INCREASE KNOWLEDGE OF DYSPHAGIA/FLUID INTAKE Description: Pt and wife will state understanding of dysphagia using handouts and educational resources independently Outcome: Not Progressing Goal: RH STG INCREASE KNOWLEGDE OF HYPERLIPIDEMIA Description: Pt and wife will state understanding of HLD using medications, dietary restrictions, exercise and using handouts and educational resources independently Outcome: Not Progressing Goal: RH STG INCREASE KNOWLEDGE OF STROKE PROPHYLAXIS Description: Pt and wife will state understanding of secondary stroke using medications, dietary restrictions, exercise and using handouts and educational resources independently Outcome: Not Progressing

## 2019-08-04 NOTE — Progress Notes (Signed)
Occupational Therapy Weekly Progress Note  Patient Details  Name: Kyle Garcia MRN: [id number] Date of Birth: [birth year]  Beginning of progress report period: July 27, 2019 End of progress report period: August 04, 2019  Today's Date: 08/04/2019 OT Individual Time: 0850-1000 OT Individual Time Calculation (min): 70 min    Patient has met 1 of 3 short term goals.  Pt has had an extension of his CVA and is not making progress. Pt is requiring total-max A for all ADLs and functional mobility. Skilled intervention is now focusing efforts on decreasing burden of care and comfort care/quality of life.   Patient continues to demonstrate the following deficits: muscle weakness and muscle joint tightness, impaired timing and sequencing, abnormal tone, unbalanced muscle activation, decreased coordination and decreased motor planning, decreased attention to left, decreased initiation, decreased attention, decreased awareness, decreased problem solving, decreased safety awareness, decreased memory and delayed processing and decreased sitting balance, decreased standing balance, decreased postural control, hemiplegia and decreased balance strategies and therefore will continue to benefit from skilled OT intervention to enhance overall performance with Reduce care partner burden.  Patient not progressing toward long term goals.  See goal revision..  Plan of care revisions: Several goals discontinued and new goals added to reflect change of pt status and family decision to pursue hospice care.  OT Short Term Goals Week 3:  OT Short Term Goal 1 (Week 3): Pt will initiate bathing one body part with mod vc OT Short Term Goal 1 - Progress (Week 3): Not met OT Short Term Goal 2 (Week 3): Pt will attend to one grooming task with mod vc OT Short Term Goal 2 - Progress (Week 3): Not met OT Short Term Goal 3 (Week 3): Pt will don shirt with max A OT Short Term Goal 3 - Progress (Week 3): Met Week 4:  OT Short  Term Goal 1 (Week 4): STG= LTG d/t ELOS  Skilled Therapeutic Interventions/Progress Updates:    Pt received supine with no c/o pain at rest. Upon rolling, pt grimacing with pain in back and actively resisting rolling. Rest breaks provided as much as possible. Pt reported need to have BM. Pt rolled R with max A and was already incontinent of BM in brief. Pt placed on bed pan for several minutes (lack of +2 assistance to get pt to [facility]). Total A provided to clean pt and don new brief/pants. Pt transferred to EOB with total A. Pt able to maintain sitting balance with fluctuating min-max A. LUE placed in weightbearing position as pt sat. Pt required total A for squat pivot transfer to TIS w/c. Pt sat in w/c and participated in oral hygiene with mod A for thoroughness. Pt was transported to the therapy gym and taken to an open window for a change in scenery and to increase orientation. PROM completed to pt's LUE to maintain muscle length and reduce contracture risk. Pt left with all needs met, chair alarm set.   Therapy Documentation Precautions:  Precautions Precautions: Fall Precaution Comments: Dense Lt hemi, pusher Restrictions Weight Bearing Restrictions: No Pain: Pain Assessment Pain Scale: 0-10 Pain Score: 0-No pain   Therapy/Group: Individual Therapy  [name] 08/04/2019, 10:17 AM

## 2019-08-05 ENCOUNTER — Other Ambulatory Visit: Payer: Medicare Other

## 2019-08-05 ENCOUNTER — Inpatient Hospital Stay (HOSPITAL_COMMUNITY): Payer: Medicare Other | Admitting: Speech Pathology

## 2019-08-05 ENCOUNTER — Inpatient Hospital Stay (HOSPITAL_COMMUNITY): Payer: Medicare Other

## 2019-08-05 ENCOUNTER — Inpatient Hospital Stay (HOSPITAL_COMMUNITY): Payer: Medicare Other | Admitting: Physical Therapy

## 2019-08-05 DIAGNOSIS — R410 Disorientation, unspecified: Secondary | ICD-10-CM

## 2019-08-05 LAB — CBC
HCT: 26.1 % — ABNORMAL LOW (ref 39.0–52.0)
Hemoglobin: 8.2 g/dL — ABNORMAL LOW (ref 13.0–17.0)
MCH: 25.9 pg — ABNORMAL LOW (ref 26.0–34.0)
MCHC: 31.4 g/dL (ref 30.0–36.0)
MCV: 82.3 fL (ref 80.0–100.0)
Platelets: 144 10*3/uL — ABNORMAL LOW (ref 150–400)
RBC: 3.17 MIL/uL — ABNORMAL LOW (ref 4.22–5.81)
RDW: 15.4 % (ref 11.5–15.5)
WBC: 10.1 10*3/uL (ref 4.0–10.5)
nRBC: 0 % (ref 0.0–0.2)

## 2019-08-05 LAB — BASIC METABOLIC PANEL
Anion gap: 9 (ref 5–15)
BUN: 16 mg/dL (ref 8–23)
CO2: 22 mmol/L (ref 22–32)
Calcium: 7.9 mg/dL — ABNORMAL LOW (ref 8.9–10.3)
Chloride: 105 mmol/L (ref 98–111)
Creatinine, Ser: 0.67 mg/dL (ref 0.61–1.24)
GFR calc Af Amer: >60 mL/min (ref >60)
GFR calc non Af Amer: >60 mL/min (ref >60)
Glucose, Bld: 105 mg/dL — ABNORMAL HIGH (ref 70–99)
Potassium: 3.5 mmol/L (ref 3.5–5.1)
Sodium: 136 mmol/L (ref 135–145)

## 2019-08-05 MED ORDER — GUAIFENESIN-CODEINE 100-10 MG/5ML PO SOLN
10.0000 mL | Freq: Four times a day (QID) | ORAL | Status: DC | PRN
  Administered 2019-08-05 – 2019-08-12 (×4): 10 mL via ORAL
  Filled 2019-08-05 (×5): qty 10

## 2019-08-05 NOTE — Plan of Care (Signed)
  Problem: Consults Goal: RH STROKE PATIENT EDUCATION Description: See Patient Education module for education specifics  Outcome: Progressing   Problem: RH SAFETY Goal: RH STG ADHERE TO SAFETY PRECAUTIONS W/ASSISTANCE/DEVICE Description: STG Adhere to Safety Precautions With MIN Assistance/Device. Outcome: Progressing   Problem: RH PAIN MANAGEMENT Goal: RH STG PAIN MANAGED AT OR BELOW PT'S PAIN GOAL Description: AT OR BELOW LEVEL 4 Outcome: Progressing   Problem: RH KNOWLEDGE DEFICIT Goal: RH STG INCREASE KNOWLEDGE OF HYPERTENSION Description: Pt and wife will state understanding of HTN using medications, dietary restrictions, exercise and using handouts and educational resources independently Outcome: Progressing Goal: RH STG INCREASE KNOWLEDGE OF DYSPHAGIA/FLUID INTAKE Description: Pt and wife will state understanding of dysphagia using handouts and educational resources independently Outcome: Progressing Goal: RH STG INCREASE KNOWLEGDE OF HYPERLIPIDEMIA Description: Pt and wife will state understanding of HLD using medications, dietary restrictions, exercise and using handouts and educational resources independently Outcome: Progressing Goal: RH STG INCREASE KNOWLEDGE OF STROKE PROPHYLAXIS Description: Pt and wife will state understanding of secondary stroke using medications, dietary restrictions, exercise and using handouts and educational resources independently Outcome: Progressing   Problem: RH BOWEL ELIMINATION Goal: RH STG MANAGE BOWEL WITH ASSISTANCE Description: STG Manage Bowel with MAX Assistance. Outcome: Not Progressing Note: Pt is currently total assist    Problem: RH BLADDER ELIMINATION Goal: RH STG MANAGE BLADDER WITH ASSISTANCE Description: STG Manage Bladder With MAX Assistance Outcome: Not Progressing Note: Total assist

## 2019-08-05 NOTE — Progress Notes (Signed)
Occupational Therapy Session Note  Patient Details  Name: Kyle Garcia MRN: [id number] Date of Birth: 1951/09/18  Today's Date: 08/05/2019 OT Individual Time: 0102-7253 OT Individual Time Calculation (min): 57 min    Short Term Goals: Week 1:  OT Short Term Goal 1 (Week 1): Pt will complete 1 grooming task EOB with 1 assist for sitting balance OT Short Term Goal 1 - Progress (Week 1): Progressing toward goal OT Short Term Goal 2 (Week 1): Pt will initiate washing affected arm with max vcs OT Short Term Goal 2 - Progress (Week 1): Progressing toward goal OT Short Term Goal 3 (Week 1): Pt will exhibit improved sustained attention by initiating placing 1 limb into UB garment OT Short Term Goal 3 - Progress (Week 1): Not met  Skilled Therapeutic Interventions/Progress Updates:    1:1. Pt received in bed with pain in back predicated by RN. Pt requires total A of 2 at bed level for dressing. Pt unable to follow commands for dressing. Pt completes squat pivot transfer with total A of 2 to EOB<>TIS<>EOM. Pt completes sitting EOM with therapy ball behind back with OT sitting on ball to facilitate neitral pelvis and thoracic extension. Pt able to scan R to locate cone and grab correct color 3/6 trials and hand to +2 with VC for not throwing it on floor. Pt requires toal A for sitting balance against ball. OT facilitates head turning with mod A when visually scanning. Pt unable to scan to L of midline to locate cones despite head turns. Pt squirming and reporting need to toilet. Pt transfers back to bed as stated above and has small smear of BM on brief. Despite increased time on bed pan, pt unable to void more BM. Peri care total A of 2 provided and exited session with pt seated in bed, exit alarm on and call light in reach  Therapy Documentation Precautions:  Precautions Precautions: Fall Precaution Comments: Dense Lt hemi, pusher Restrictions Weight Bearing Restrictions: No General:   Vital  Signs:  Pain: Pain Assessment Pain Scale: 0-10 Pain Score: 0-No pain ADL: ADL Eating: Not assessed Grooming: Dependent(washing hands) Where Assessed-Grooming: Bed level Upper Body Bathing: Dependent Where Assessed-Upper Body Bathing: Bed level Lower Body Bathing: Dependent Where Assessed-Lower Body Bathing: Bed level Upper Body Dressing: Dependent Where Assessed-Upper Body Dressing: Bed level Lower Body Dressing: Dependent Where Assessed-Lower Body Dressing: Bed level Toileting: Dependent Where Assessed-Toileting: Bed level Toilet Transfer: Not assessed Tub/Shower Transfer: Not assessed Vision   Perception    Praxis   Exercises:   Other Treatments:     Therapy/Group: Individual Therapy  [name] 08/05/2019, 9:58 AM

## 2019-08-05 NOTE — Progress Notes (Signed)
Pt has strong non productive cough that appears to be worsening. Upon ausculation lungs are clear bilaterally. Pt in upright position bed at 38 degrees. VS are as follows 99.4 oral, 125 P, 20Resp, 100/69, 99% O2 Sats. Will continue to monitor patient and contact on call NP for further orders.

## 2019-08-05 NOTE — Progress Notes (Signed)
[facility] PHYSICAL MEDICINE & REHABILITATION PROGRESS NOTE   Subjective/Complaints: Patient seen sitting up in bed this morning and later with therapies.  No reported issues overnight.  Seen and examined with resident physician.  ROS: Limited due to cognition.  Objective:   No results found. Recent Labs    08/05/19 0537  WBC 10.1  HGB 8.2*  HCT 26.1*  PLT 144*   Recent Labs    08/05/19 0537  NA 136  K 3.5  CL 105  CO2 22  GLUCOSE 105*  BUN 16  CREATININE 0.67  CALCIUM 7.9*    Intake/Output Summary (Last 24 hours) at 08/05/2019 1022 Last data filed at 08/05/2019 0900 Gross per 24 hour  Intake 640 ml  Output -  Net 640 ml     Physical Exam: Vital Signs Blood pressure 117/70, pulse (!) 110, temperature 98.8 F (37.1 C), temperature source Oral, resp. rate 16, height 5\' 11"  (1.803 m), weight 58.8 kg, SpO2 98 %. Constitutional: No distress . Vital signs reviewed. HENT: Normocephalic.  Atraumatic. Eyes: EOMI. No discharge. Cardiovascular: No JVD.  No edema over the posterior Respiratory: Normal effort.  No stridor. GI: Non-distended. Musc: Pain with LUE/LLE ROM Neurologic: Alert  Slow processing Motor: RUE/RLE: Grossly 5/5  LUE/LLE: 0/5 Right gaze preference Left neglect Significant tone LUE/LLE Psych: Slowed.  Flat.   Assessment/Plan: 1. Functional deficits secondary to Right MCA infarct with dysphagia  which require 3+ hours per day of interdisciplinary therapy in a comprehensive inpatient rehab setting.  Physiatrist is providing close team supervision and 24 hour management of active medical problems listed below.  Physiatrist and rehab team continue to assess barriers to discharge/monitor patient progress toward functional and medical goals  Care Tool:  Bathing    Body parts bathed by patient: Face, Right arm, Left arm, Abdomen, Chest, Front perineal area, Buttocks, Right upper leg, Left upper leg, Left lower leg, Right lower leg   Body parts  bathed by helper: Right arm, Left arm, Abdomen, Front perineal area, Buttocks, Left upper leg, Right lower leg, Left lower leg, Chest, Right upper leg, Face     Bathing assist Assist Level: Total Assistance - Patient < 25%     Upper Body Dressing/Undressing Upper body dressing   What is the patient wearing?: Pull over shirt    Upper body assist Assist Level: Dependent - Patient 0%    Lower Body Dressing/Undressing Lower body dressing      What is the patient wearing?: Pants, Incontinence brief     Lower body assist Assist for lower body dressing: Dependent - Patient 0%     Toileting Toileting    Toileting assist Assist for toileting: Dependent - Patient 0%     Transfers Chair/bed transfer  Transfers assist     Chair/bed transfer assist level: 2 Helpers     Locomotion Ambulation   Ambulation assist   Ambulation activity did not occur: Safety/medical concerns  Assist level: 2 helpers Assistive device: No Device Max distance: 10   Walk 10 feet activity   Assist  Walk 10 feet activity did not occur: Safety/medical concerns  Assist level: 2 helpers Assistive device: No Device   Walk 50 feet activity   Assist Walk 50 feet with 2 turns activity did not occur: Safety/medical concerns         Walk 150 feet activity   Assist Walk 150 feet activity did not occur: Safety/medical concerns         Walk 10 feet on uneven surface  activity   Assist Walk 10 feet on uneven surfaces activity did not occur: Safety/medical concerns         Wheelchair     Assist Will patient use wheelchair at discharge?: Yes Type of Wheelchair: Manual    Wheelchair assist level: Dependent - Patient 0%(TIS w/c) Max wheelchair distance: 150 ft    Wheelchair 50 feet with 2 turns activity    Assist        Assist Level: Dependent - Patient 0%   Wheelchair 150 feet activity     Assist     Assist Level: Dependent - Patient 0%    Medical  Problem List and Plan: 1.Dense lefthemiparesis, now with spasticitywith dysphagiasecondary to embolic shower right MCA occlusion status post reperfusion. Infarct felt to be embolic secondary to hypercoagulable state from lung cancer  No plan for further chemo  Continue CIR  -Appreciate palliative care assistance. Family deciding upon next step 2. Antithrombotics: -DVT/anticoagulation/history of right lower extremity DVT: Eliquis -antiplatelet therapy: N/A 3. Pain Management/spastic left hemiparesis:Tylenol only as we're trying to avoid oversedation   -antispasmodics   -have stopped baclofen as above   -Orthosis  -kpad for back 4. Mood:Provide emotional support -antipsychotic agents: N/A  -holding ritalin 5. Neuropsych: This patientis notcapable of making decisions on hisown behalf. 6. Skin/Wound Care:Routine skin checks 7. Fluids/Electrolytes/Nutrition:   -resumed IVF ---continue to support BP  -BUN/Cr stable on 8/12 8.Stage IV adenocarcinoma right lung. Chemotherapy as per [name]  -onc appts on temporary hold until after rehab discharge 9. Aspiration pneumonia, low grade temp.   Unasyn completed  WBCs 10.1 on 8/12 Aspiration precautions  CXR with persistent opacities, afebrile  -ucx only 10k  -blood cx NG 10.  Post stroke dysphagia.   D1 honeys  11. Hyperlipidemia. Crestor 12. Remote tobacco abuse. Counseling 13.Acute on chronic anemia.   Hemoglobin 8.2 on 8/12  Continue to monitor  14 Hypocalcemia-stable at 8.4 on 7/30  -likely related to #8 15.     Tachycardia: has had since admit--110's to 120's---no real change  At baseline    LOS: 26 days A FACE TO FACE EVALUATION WAS PERFORMED  Kyle Garcia Lorie Phenix 08/05/2019, 10:22 AM

## 2019-08-05 NOTE — Progress Notes (Signed)
Speech Language Pathology Daily Session Note  Patient Details  Name: Kyle Garcia MRN: [id number] Date of Birth: 21-Mar-1951  Today's Date: 08/05/2019 SLP Individual Time: 5188-4166 SLP Individual Time Calculation (min): 50 min  Short Term Goals: Week 4: SLP Short Term Goal 1 (Week 4): Given Total A cues, pt will consume current diet with minimal overt s/s of aspiration. SLP Short Term Goal 2 (Week 4): Patient will demonstrate sustained attention to functional tasks for 5 minutes with Max A verbal cues for redirection. SLP Short Term Goal 3 (Week 4): Given Max A cues, pt will follow 1 step basic directions in 5 out of 10 opportunities. SLP Short Term Goal 4 (Week 4): Given Max A cues, patient will initiate functional tasks in 5 out of 10 opportunities.  Skilled Therapeutic Interventions: Pt was seen for skilled ST intervention targeting aforementioned goals. Upon arrival of SLP, anterior leakage of oral secretions was noted on the left side of pt's face. Poor awareness of this, due to left neglect.  SLP facilitated session by providing trials of Dysphagia 1 solids and honey thick liquids via teaspoon. Pt tolerated these consistencies without increase in anterior leakage, oral residue, or overt s/s aspiration. Pt was able to follow 1-step verbal directions with 70% accuracy today given no additional cues, and he sustained attention to task for over five minutes in quiet environment. Pt was able to initiate functional activity in 3/5 opportunities, requiring mod visual and tactile assistance. Pt verbalized need for toileting, and required mod+ assist to locate and activate the call bell. Pt was left in bed with nurse tech present for toileting, all needs within reach. Continue ST per current plan of care.  Pain Pain Assessment Pain Scale: Faces Faces Pain Scale: Hurts little more Pain Type: Acute pain Pain Location: Abdomen Pain Orientation: Lower Pain Descriptors / Indicators:  Contraction Pain Onset: On-going Patients Stated Pain Goal: 0 Pain Intervention(s): Other (Comment)(nurse tech notified pt requesting to use the bathroom) Multiple Pain Sites: No  Therapy/Group: Individual Therapy   Kyle Garcia B. Quentin Ore, [facility], CCC-SLP Speech Language Pathologist  [name] 08/05/2019, 4:09 PM

## 2019-08-05 NOTE — Progress Notes (Signed)
Physical Therapy Session Note  Patient Details  Name: Kyle Garcia MRN: [id number] Date of Birth: 01-28-1951  Today's Date: 08/05/2019 PT Individual Time: 1100-1200 PT Individual Time Calculation (min): 60 min   Short Term Goals: Week 4:  PT Short Term Goal 1 (Week 4): Pt will participate in bed mobliity tasks 50% of the time with max cuing. PT Short Term Goal 2 (Week 4): Pt will participate in bed<>w/c transfers 50% of the time with max cuing.  Skilled Therapeutic Interventions/Progress Updates:    Pt received semi-reclined in bed, agreeable to PT session. Pt complains of pain in low back with rolling and transfers, not rated and declines intervention, no pain at rest. Rolling L/R with max to total A x 2 for dependent donning of pants. Supine to sit with max A x 2. Slide board transfer bed to w/c then w/c to/from mat table total A x 2. Pt unable to follow cues to assist with transfer. Sitting balance EOM with anywhere from close SBA to max/total A due to UE/LE spasms as well as spasms in trunk musculature. Attempt to have pt sit in upright position with trunk and neck in upright position, requires max multimodal cueing to maintain position. Attempt to have pt scan visual field to the L to identify toy animals, pt able to correctly name 3/4 animals with max cueing to attend to task. Seated horseshoe toss with use of RUE, max cueing to attend to task with max A to maintain sitting balance EOM. Sit to stand via 3 muskateers method with max to total A x 2 to maintain standing, max cueing for upright stance as pt tends to stand with B knees flexed, head and trunk flexed. Seated trunk flexion/extension x 5 reps with max multimodal cueing to initiate task. Pt demos fair ability to control trunk and UE/LE movements but consistent spasms throughout session limit ability to functionally participate. Pt left semi-reclined in TIS w/c in room with needs in reach, quick release belt and chair alarm in place at end  of session.  Therapy Documentation Precautions:  Precautions Precautions: Fall Precaution Comments: Dense Lt hemi, pusher Restrictions Weight Bearing Restrictions: No    Therapy/Group: Individual Therapy   Excell Seltzer, PT, DPT  08/05/2019, 4:24 PM

## 2019-08-05 NOTE — Patient Care Conference (Signed)
Inpatient RehabilitationTeam Conference and Plan of Care Update Date: 08/04/2019   Time: 10:25 AM    Patient Name: Kyle Garcia      Medical Record Number: [id number]  Date of Birth: 1951/10/22 Sex: Male         Room/Bed: 4W20C/4W20C-01 Payor Info: Payor: MEDICARE / Plan: MEDICARE PART A AND B / Product Type: *No Product type* /    Admitting Diagnosis: 1. TBI Team  RT. CVA; 22-24days  Admit Date/Time:  07/10/2019  4:34 PM Admission Comments: No comment available   Primary Diagnosis:  <principal problem not specified> Principal Problem: <principal problem not specified>  Patient Active Problem List   Diagnosis Date Noted  . Confusion   . DNR (do not resuscitate)   . Palliative care encounter   . Sinus tachycardia   . Hypocalcemia   . Spastic hemiparesis ([place])   . SIRS (systemic inflammatory response syndrome) ([facility])   . Acute on chronic anemia   . Hx of ischemic multifocal multiple vascular territories stroke 07/10/2019  . Dysphagia due to recent cerebrovascular accident 07/10/2019  . Fever 07/10/2019  . Right middle cerebral artery stroke ([place]) 07/10/2019  . Acute right arterial ischemic stroke, middle cerebral artery (MCA) ([place]) s/p mechanical thrombectomy 07/03/2019  . Middle cerebral artery embolism, right 07/03/2019  . CAP (community acquired pneumonia) 06/20/2019  . Stroke ([place])   . Stroke due to embolism ([place]) 06/17/2019  . Encounter for antineoplastic chemotherapy 06/05/2019  . Encounter for antineoplastic immunotherapy 06/05/2019  . Goals of care, counseling/discussion 06/05/2019  . Adenocarcinoma of right lung, stage 4 ([place]) 06/04/2019  . Cough 05/21/2019  . S/P bronchoscopy with biopsy   . DVT (deep venous thrombosis) ([place]) 05/08/2019  . Weight loss 04/28/2019  . Aspiration pneumonia ([place]) 04/10/2019  . GERD without esophagitis 11/14/2015  . Hyperlipidemia 11/14/2015  . Benign prostatic hyperplasia 12/24/2008    Expected Discharge Date: Expected Discharge  Date: (TBD)  Team Members Present: Physician leading conference: Dr. Alger Simons Social Worker Present: Lennart Pall, LCSW Nurse Present: Dwaine Gale, RN PT Present: Lavone Nian, PT OT Present: Laverle Hobby, OT SLP Present: Weston Anna, SLP PPS Coordinator present : Gunnar Fusi, SLP     Current Status/Progress Goal Weekly Team Focus  Medical   large right MCA infarct. more alert. still incontinent, not progressing with therapies, family deciding upon hospice  define dispo plan  see above   Bowel/Bladder   Continent with incontineent episdoes LBM 8/10  less incontinent episodes timed toileting while awake  assess qshift/prn   Swallow/Nutrition/ Hydration   Dys. 1 textures with honey-thick liquids via tsp, Max A  Max A  tolerance of current diet, possible trials of upgraded textures   ADL's   Total-max A ADLs, total +2 transfers. Poor attention, command following, orientation  Several goals discontinued, goals aimed toward increasing quality of life and decreasing burden of care. Max A for goals left.  decreasing family burden of care, L NMR, sitting balance, d/c planning   Mobility   total<>+2 assist overall, impaired cognition/awareness/attention, visual spatial deficits  downgraded to max assist sitting balance, max assist bed mobility, max assist bed<>chair  midline orientation, sitting balance, NMR, cognitive remediation, transfers, bed mobility, positioning, OOB tolerance, d/c planning   Communication             Safety/Cognition/ Behavioral Observations  Max-Total A  Max A  attention, initiation, awareness, scanning to midline   Pain   no c/o pain  remain pain free  assess qshift/prn  Skin   ecchymosis to BUE  maintain intact skin and free from infection  assess qshift/prn    Rehab Goals Patient on target to meet rehab goals: No *See Care Plan and progress notes for long and short-term goals.     Barriers to Discharge  Current Status/Progress Possible  Resolutions Date Resolved   Physician    Medical stability               Nursing                  PT  Inaccessible home environment;Nutrition means;Pending chemo/radiation;Incontinence                 OT                  SLP                SW                Discharge Planning/Teaching Needs:  Plan has been for pt to d/c home with wife as primary caregiver and intermittent assistance of adult children, however, significant concerns that pt's LOC is too great for wife.  Palliative team now involved and decisions on d/c plans being made.  Will follow up with wife to discuss further.  Teaching will need to continue addition of adaptive DME (i.e. hoyer) to allow care at home if wife confirms plan for home d/c vs SNF.   Team Discussion:  Palliative team met with family yesterday and awaiting family decision for d/c plans.  Remains very impaired;  New sub-acute infarct.  Decision to NOT proceed with any further chemo/ radiation.  Incont b/b.  Still max - total assist overall.  Back spasms are severe and affect movement.  Cannot follow directions.  D1, honey via tsp.  Anticipate downgrading of most goals.  Revisions to Treatment Plan:  Anticipate downgrading of most goals.    Continued Need for Acute Rehabilitation Level of Care: The patient requires daily medical management by a physician with specialized training in physical medicine and rehabilitation for the following conditions: Daily direction of a multidisciplinary physical rehabilitation program to ensure safe treatment while eliciting the highest outcome that is of practical value to the patient.: Yes Daily medical management of patient stability for increased activity during participation in an intensive rehabilitation regime.: Yes Daily analysis of laboratory values and/or radiology reports with any subsequent need for medication adjustment of medical intervention for : Neurological problems;Pulmonary problems   I attest that I was  present, lead the team conference, and concur with the assessment and plan of the team.   Donata Clay, Hye Trawick 08/05/2019, 1:03 PM     Team conference was held via web/ teleconference due to [place] - 19

## 2019-08-06 ENCOUNTER — Inpatient Hospital Stay (HOSPITAL_COMMUNITY): Payer: Medicare Other

## 2019-08-06 ENCOUNTER — Inpatient Hospital Stay (HOSPITAL_COMMUNITY): Payer: Medicare Other | Admitting: Speech Pathology

## 2019-08-06 NOTE — Progress Notes (Signed)
Palliative Medicine RN Note: Rec'd a message from PMT CNS Joslyn Devon. She spoke with family this morning, and they are requesting home hospice referral through [facility] ([facility] Joslyn Devon also reached out to [place] and left voicemail with update.  Marjie Skiff Melita Villalona, RN, BSN, [facility] Palliative Medicine Team 08/06/2019 12:39 PM Office [phone]

## 2019-08-06 NOTE — Progress Notes (Signed)
Speech Language Pathology Weekly Progress and Session Note  Patient Details  Name: Teren Zurcher MRN: [id number] Date of Birth: 1951/09/27  Beginning of progress report period: July 30, 2019 End of progress report period: August 06, 2019  Today's Date: 08/06/2019 SLP Individual Time: 1400-1455 SLP Individual Time Calculation (min): 55 min  Short Term Goals: Week 4: SLP Short Term Goal 1 (Week 4): Given Total A cues, pt will consume current diet with minimal overt s/s of aspiration. SLP Short Term Goal 1 - Progress (Week 4): Met SLP Short Term Goal 2 (Week 4): Patient will demonstrate sustained attention to functional tasks for 5 minutes with Max A verbal cues for redirection. SLP Short Term Goal 2 - Progress (Week 4): Not met SLP Short Term Goal 3 (Week 4): Given Max A cues, pt will follow 1 step basic directions in 5 out of 10 opportunities. SLP Short Term Goal 3 - Progress (Week 4): Not met SLP Short Term Goal 4 (Week 4): Given Max A cues, patient will initiate functional tasks in 5 out of 10 opportunities. SLP Short Term Goal 4 - Progress (Week 4): Not met    New Short Term Goals: Week 5: SLP Short Term Goal 1 (Week 5): Given Max A cues, pt will consume current diet with minimal overt s/s of aspiration. SLP Short Term Goal 2 (Week 5): Patient will demonstrate sustained attention to functional tasks for 5 minutes with Max A verbal cues for redirection. SLP Short Term Goal 3 (Week 5): Given Max A cues, pt will follow 1 step basic directions in 5 out of 10 opportunities. SLP Short Term Goal 4 (Week 5): Given Max A cues, patient will initiate functional tasks in 5 out of 10 opportunities.  Weekly Progress Updates: Patient has made minimal gains and has met 1 of 4 STG this reporting period. Patient had repeat MBS this reporting period and was downgraded to Dys.1 textures with honey-thick liquids via tsp due to aspiration. Patient is consuming his current diet with minimal overt s/s of  aspiration and Max A verbal cues for use of swallowing compensatory strategies. Patient continues to demonstrate significant cognitive impairments and requires Max-total A to complete basic and familiar tasks safely in regards to sustained attention, initiation, problem solving, awareness and recall. Patient also continues to be disoriented at times with language of confusion. Patient and family education ongoing. Suspect patient's family will be unable to provide the necessary physical and cognitive assistance needed at discharge will likely have to discharge patient to a SNF. Patient would benefit from continued skilled SLP intervention to maximize swallowing and cognitive function prior to discharge      Intensity: Minumum of 1-2 x/day, 30 to 90 minutes Frequency: 3 to 5 out of 7 days Duration/Length of Stay: TBD due to SNF placement Treatment/Interventions: Cognitive remediation/compensation;Dysphagia/aspiration precaution training;Internal/external aids;Speech/Language facilitation;Therapeutic Activities;Environmental controls;Cueing hierarchy;Functional tasks;Patient/family education   Daily Session  Skilled Therapeutic Interventions: Skilled treatment session focused on cognitive goals. Upon arrival, patient had been incontinent. SLP facilitated session by providing extra time and total A for initiation with bed mobility. Patient was independently oriented to situation but demonstrated significant language of confusion with perseveration and decreased intelligibility. Patient also required hand over hand assist to initiate a basic sorting task  with Max A verbal and visual cues needed for attention and problem solving with task. Patient also consumed honey-thick liquids via tsp with delayed cough X 1, suspect due to residue. Patient left upright in bed with alarm on and all  needs within reach. Continue with current plan of care.     Pain Pain with movement. RN aware   Therapy/Group:  Individual Therapy  Shamina Etheridge 08/06/2019, 6:48 AM

## 2019-08-06 NOTE — Progress Notes (Signed)
Phone conversation with patient wife Kyle Garcia and their daughter via speaker.  They have been investigating home health services after a conversation with Dr. Naaman Plummer who expressed his thoughts of the patient need for 24 hour care at home.  This would be overwhelming for the family with his current status.  They verbalized that a week or so ago the thought was that he would be have improved his functional status, in fact he has declined.    They really do not want to consider a SNF placement due to his wishes to come home as well as the visitor restrictions.  I discussed at length the option of hospice for support at home.  Detailed the hospice medicare benefit and that hospice does not provide 24hr custodial care but that they would be very supportive and a good layer to an approach for home care.  As indicated, they have done some investigation on their own regarding caregivers.  I gave some ideas about finding private help.  This may be a less expensive option and with the support of a hospice program to monitor symptoms and provide equipment this may be a workable plan.  I encouraged them to research several local hospices via the internet.  Explained the levels of care and the role of an inpatient hospice facility.  Both Kyle Garcia and her daughter verbalized that they realize that Kyle Garcia is not going to recover from this and is likely entering the final stages of his life.  Offered support.  Kyle Garcia would like the opportunity to discuss these plans with [name] in person which she plans to do later today.  She will follow up with either PMT by phone or CIR CSW.    VM left for [name], CIR CSW regarding our conversation.  [name], MSN, RN-BC, [facility], [facility] Palliative Clinical Specialist

## 2019-08-06 NOTE — Progress Notes (Signed)
Occupational Therapy Session Note  Patient Details  Name: Kyle Garcia MRN: [id number] Date of Birth: 1951-08-05  Today's Date: 08/06/2019 OT Individual Time: 0525-9102 OT Individual Time Calculation (min): 55 min    Short Term Goals: Week 1:  OT Short Term Goal 1 (Week 1): Pt will complete 1 grooming task EOB with 1 assist for sitting balance OT Short Term Goal 1 - Progress (Week 1): Progressing toward goal OT Short Term Goal 2 (Week 1): Pt will initiate washing affected arm with max vcs OT Short Term Goal 2 - Progress (Week 1): Progressing toward goal OT Short Term Goal 3 (Week 1): Pt will exhibit improved sustained attention by initiating placing 1 limb into UB garment OT Short Term Goal 3 - Progress (Week 1): Not met  Skilled Therapeutic Interventions/Progress Updates:    1:1. Pt received in bed rolling with total A of 1 to don clean clothing, check for clean brief and wash buttocks. Pt supine>sitting EOB with total A of 2. +2 squat pivot transfer EOB<>w/c<>EOM. Pt not actively participating in transfers. Pt completes reaching activity with HOH A from +2 at [facility] scanning midline> far R and midline>barely L of center to place blocks into bucket. Pt able to completes scanning R<midline with MOD A and MAX A for midline>L. OT on therapy ball behind pt facilitating anterior pelvic tilt and total A for sitting balance. Gentle PROM of LUE provided for muscle lengthening and decreasing risk of contracture with total A and pt c/o pain at rest and with PROM. Exited session with pt seated in bed, exit alarm on and call light in reach  Therapy Documentation Precautions:  Precautions Precautions: Fall Precaution Comments: Dense Lt hemi, pusher Restrictions Weight Bearing Restrictions: No General:   Vital Signs:  Pain:   ADL: ADL Eating: Not assessed Grooming: Dependent(washing hands) Where Assessed-Grooming: Bed level Upper Body Bathing: Dependent Where Assessed-Upper Body Bathing: Bed  level Lower Body Bathing: Dependent Where Assessed-Lower Body Bathing: Bed level Upper Body Dressing: Dependent Where Assessed-Upper Body Dressing: Bed level Lower Body Dressing: Dependent Where Assessed-Lower Body Dressing: Bed level Toileting: Dependent Where Assessed-Toileting: Bed level Toilet Transfer: Not assessed Tub/Shower Transfer: Not assessed Vision   Perception    Praxis   Exercises:   Other Treatments:     Therapy/Group: Individual Therapy  [name] 08/06/2019, 10:13 AM

## 2019-08-06 NOTE — Progress Notes (Signed)
Physical Therapy Weekly Progress Note  Patient Details  Name: Kyle Garcia MRN: [id number] Date of Birth: 08-04-1951  Beginning of progress report period: July 31, 2019 End of progress report period: August 06, 2019  Today's Date: 08/06/2019   Patient has met 0 of 2 short term goals.  Pt continues to make very slow progress towards all LTG's as he is limited by the deficits noted below. Pt continues to require total +1-2 assist for all mobility and transfers. Continued to attempt to have pt participate in sit>stand, bed<>w/c, bed mobility during sessions, as well as focused on NMR, sitting balance & midline orientation & cognitive remediation. Pt was able to propel w/c 20 ft with max assist on this date.   Pt's family have participated in palliative care consult and have decided to take pt home with hospice care.   Pt would benefit from continued skilled PT treatment to focus on the deficits noted above & below, as well as to educate caregivers and assist in d/c planning.   Patient continues to demonstrate the following deficits muscle weakness, muscle joint tightness and muscle paralysis, decreased cardiorespiratoy endurance, motor apraxia, decreased coordination and decreased motor planning, decreased visual acuity, decreased visual perceptual skills and decreased visual motor skills, decreased midline orientation, decreased attention to left, left side neglect, decreased motor planning and ideational apraxia, decreased initiation, decreased attention, decreased awareness, decreased problem solving, decreased safety awareness, decreased memory and delayed processing, central origin and decreased sitting balance, decreased standing balance, decreased postural control, hemiplegia and decreased balance strategies and therefore will continue to benefit from skilled PT intervention to increase functional independence with mobility.    PT Short Term Goals Week 4:  PT Short Term Goal 1 (Week 4): Pt  will participate in bed mobliity tasks 50% of the time with max cuing. PT Short Term Goal 1 - Progress (Week 4): Not met PT Short Term Goal 2 (Week 4): Pt will participate in bed<>w/c transfers 50% of the time with max cuing. PT Short Term Goal 2 - Progress (Week 4): Not met Week 5:  PT Short Term Goal 1 (Week 5): STG = LTG due to estimated d/c date.     Therapy Documentation Precautions:  Precautions Precautions: Fall Precaution Comments: Dense Lt hemi, pusher Restrictions Weight Bearing Restrictions: No     [name] 08/06/2019, 7:42 PM

## 2019-08-06 NOTE — Progress Notes (Signed)
[place] PHYSICAL MEDICINE & REHABILITATION PROGRESS NOTE   Subjective/Complaints: Pt up with therapy. No new complaints.   ROS: Limited due to cognitive/behavioral  .  Objective:   No results found. Recent Labs    08/05/19 0537  WBC 10.1  HGB 8.2*  HCT 26.1*  PLT 144*   Recent Labs    08/05/19 0537  NA 136  K 3.5  CL 105  CO2 22  GLUCOSE 105*  BUN 16  CREATININE 0.67  CALCIUM 7.9*    Intake/Output Summary (Last 24 hours) at 08/06/2019 1408 Last data filed at 08/06/2019 1300 Gross per 24 hour  Intake 473 ml  Output -  Net 473 ml     Physical Exam: Vital Signs Blood pressure (!) 123/59, pulse (!) 121, temperature 98.9 F (37.2 C), temperature source Oral, resp. rate 18, height 5\' 11"  (1.803 m), weight 58.8 kg, SpO2 97 %. Constitutional: No distress . Vital signs reviewed. HEENT: EOMI, oral membranes moist Neck: supple Cardiovascular: RRR without murmur. No JVD    Respiratory: CTA Bilaterally without wheezes or rales. Normal effort    GI: BS +, non-tender, non-distended  Musc: Pain with LUE/LLE ROM Neurologic: fairly alert  Slow processing Motor: RUE/RLE: Grossly 5/5  LUE/LLE: 0/5 Right gaze preference Left neglect Significant tone LUE/LLE---no change Psych: flat   Assessment/Plan: 1. Functional deficits secondary to Right MCA infarct with dysphagia  which require 3+ hours per day of interdisciplinary therapy in a comprehensive inpatient rehab setting.  Physiatrist is providing close team supervision and 24 hour management of active medical problems listed below.  Physiatrist and rehab team continue to assess barriers to discharge/monitor patient progress toward functional and medical goals  Care Tool:  Bathing    Body parts bathed by patient: Face, Right arm, Left arm, Abdomen, Chest, Front perineal area, Buttocks, Right upper leg, Left upper leg, Left lower leg, Right lower leg   Body parts bathed by helper: Right arm, Left arm, Abdomen,  Front perineal area, Buttocks, Left upper leg, Right lower leg, Left lower leg, Chest, Right upper leg, Face     Bathing assist Assist Level: Total Assistance - Patient < 25%     Upper Body Dressing/Undressing Upper body dressing   What is the patient wearing?: Pull over shirt    Upper body assist Assist Level: Dependent - Patient 0%    Lower Body Dressing/Undressing Lower body dressing      What is the patient wearing?: Pants, Incontinence brief     Lower body assist Assist for lower body dressing: Dependent - Patient 0%     Toileting Toileting    Toileting assist Assist for toileting: Dependent - Patient 0%     Transfers Chair/bed transfer  Transfers assist     Chair/bed transfer assist level: Maximal Assistance - Patient 25 - 49%     Locomotion Ambulation   Ambulation assist   Ambulation activity did not occur: Safety/medical concerns  Assist level: 2 helpers Assistive device: No Device Max distance: 10   Walk 10 feet activity   Assist  Walk 10 feet activity did not occur: Safety/medical concerns  Assist level: 2 helpers Assistive device: No Device   Walk 50 feet activity   Assist Walk 50 feet with 2 turns activity did not occur: Safety/medical concerns         Walk 150 feet activity   Assist Walk 150 feet activity did not occur: Safety/medical concerns         Walk 10 feet on uneven  surface  activity   Assist Walk 10 feet on uneven surfaces activity did not occur: Safety/medical concerns         Wheelchair     Assist Will patient use wheelchair at discharge?: Yes Type of Wheelchair: Manual    Wheelchair assist level: Maximal Assistance - Patient 25 - 49% Max wheelchair distance: 20    Wheelchair 50 feet with 2 turns activity    Assist        Assist Level: Maximal Assistance - Patient 25 - 49%   Wheelchair 150 feet activity     Assist     Assist Level: Dependent - Patient 0%    Medical Problem  List and Plan: 1.Dense lefthemiparesis, now with spasticitywith dysphagiasecondary to embolic shower right MCA occlusion status post reperfusion. Infarct felt to be embolic secondary to hypercoagulable state from lung cancer  No plan for further chemo  Continue CIR AS TOLERATED  -family has spoke with palliative care and requesting home hospice  -APPRECIATE PALLIATIVE CARE TEAM'S HELP! 2. Antithrombotics: -DVT/anticoagulation/history of right lower extremity DVT: Eliquis -antiplatelet therapy: N/A 3. Pain Management/spastic left hemiparesis:Tylenol only as we're trying to avoid oversedation   -antispasmodics   -have stopped baclofen    -Orthosis  -kpad for back 4. Mood:Provide emotional support -antipsychotic agents: N/A  -holding ritalin 5. Neuropsych: This patientis notcapable of making decisions on hisown behalf. 6. Skin/Wound Care:Routine skin checks 7. Fluids/Electrolytes/Nutrition:   -resumed IVF ---continue to support BP  -BUN/Cr stable on 8/12 8.Stage IV adenocarcinoma right lung. Chemotherapy as per [name]  -onc appts on temporary hold until after rehab discharge 9. Aspiration pneumonia, low grade temp.   Unasyn completed  WBCs 10.1 on 8/12 Aspiration precautions  CXR with persistent opacities, afebrile  -ucx only 10k  -blood cx NG 10.  Post stroke dysphagia.   D1 honeys  11. Hyperlipidemia. Crestor 12. Remote tobacco abuse. Counseling 13.Acute on chronic anemia.   Hemoglobin 8.2 on 8/12  Continue to monitor  14 Hypocalcemia-stable at 8.4 on 7/30  -likely related to #8 15.     Tachycardia: has had since admit--110's to 120's-   LOS: 27 days A FACE TO [place] 08/06/2019, 2:08 PM

## 2019-08-06 NOTE — Plan of Care (Signed)
  Problem: Consults Goal: RH STROKE PATIENT EDUCATION Description: See Patient Education module for education specifics  Outcome: Progressing   Problem: RH BOWEL ELIMINATION Goal: RH STG MANAGE BOWEL WITH ASSISTANCE Description: STG Manage Bowel with MAX Assistance. Outcome: Progressing   Problem: RH BLADDER ELIMINATION Goal: RH STG MANAGE BLADDER WITH ASSISTANCE Description: STG Manage Bladder With MAX Assistance Outcome: Progressing   Problem: RH SAFETY Goal: RH STG ADHERE TO SAFETY PRECAUTIONS W/ASSISTANCE/DEVICE Description: STG Adhere to Safety Precautions With MIN Assistance/Device. Outcome: Progressing   Problem: RH PAIN MANAGEMENT Goal: RH STG PAIN MANAGED AT OR BELOW PT'S PAIN GOAL Description: AT OR BELOW LEVEL 4 Outcome: Progressing   Problem: RH KNOWLEDGE DEFICIT Goal: RH STG INCREASE KNOWLEDGE OF HYPERTENSION Description: Pt and wife will state understanding of HTN using medications, dietary restrictions, exercise and using handouts and educational resources independently Outcome: Progressing Goal: RH STG INCREASE KNOWLEDGE OF DYSPHAGIA/FLUID INTAKE Description: Pt and wife will state understanding of dysphagia using handouts and educational resources independently Outcome: Progressing Goal: RH STG INCREASE KNOWLEGDE OF HYPERLIPIDEMIA Description: Pt and wife will state understanding of HLD using medications, dietary restrictions, exercise and using handouts and educational resources independently Outcome: Progressing Goal: RH STG INCREASE KNOWLEDGE OF STROKE PROPHYLAXIS Description: Pt and wife will state understanding of secondary stroke using medications, dietary restrictions, exercise and using handouts and educational resources independently Outcome: Progressing

## 2019-08-06 NOTE — Progress Notes (Signed)
Physical Therapy Session Note  Patient Details  Name: Kyle Garcia MRN: [id number] Date of Birth: April 21, 1951  Today's Date: 08/06/2019 PT Individual Time: 1108-1208 PT Individual Time Calculation (min): 60 min   Short Term Goals:  Week 4:  PT Short Term Goal 1 (Week 4): Pt will participate in bed mobliity tasks 50% of the time with max cuing. PT Short Term Goal 2 (Week 4): Pt will participate in bed<>w/c transfers 50% of the time with max cuing.      Skilled Therapeutic Interventions/Progress Updates:  Pt resting in bed.  PT donned socks and shoes.   Rolling L with mod assist and sitting up iwht mod assist on L side of bed.  Pt seemed to have less low back pain getting up on L side vs R side of bed, as this PT did when last seeing this pt. Pt participated briefly, intermittently, with bed mobility.  Max assist slide board transfer to R bed> w/c. 2nd person guarded, but did not have to assist.  Pt placed R hand in sink to allow PT to scrub his R fingernails with toothbrush, to clean them.  Fecal matter visible under nails. Pt briefly held onto paper towel to dry hands, droppng towel onto floor, x 2.  Using RLE with tilt in space w/c upright, pt propelled w/c with max assist, x 30' x 2.    neuromuscular re-education via forced use, multimodal cues for alternating reciprocal movement bil LEs using Kinetron 60 cm/sec from w/c, x 25, 30, 25 cycles, with hand over foot assist to keep R foot on foot plate.  Pre-gait training, sit> stand to [employer] walker x 2,  +2 with difficulty as pt pushing with his R hand.  Pt stated that his R ankle hurt.  Attempted sit> stand with pt's UEs across shoulders of PT and RT, but again pt stated that R ankle hurt.  Slide board transfer as above to return to bed.  Mod assist sit> supine.  At end of session, pt resting in bed with alarm set and needs at hand.       Therapy Documentation Precautions:  Precautions Precautions: Fall Precaution Comments: Dense  Lt hemi, pusher Restrictions Weight Bearing Restrictions: No  Pain: R ankle with wt bearing; informed RN      Therapy/Group: Individual Therapy  Reniyah Gootee 08/06/2019, 12:16 PM

## 2019-08-07 ENCOUNTER — Inpatient Hospital Stay (HOSPITAL_COMMUNITY): Payer: Medicare Other | Admitting: Physical Therapy

## 2019-08-07 ENCOUNTER — Inpatient Hospital Stay (HOSPITAL_COMMUNITY): Payer: Medicare Other | Admitting: Occupational Therapy

## 2019-08-07 ENCOUNTER — Inpatient Hospital Stay (HOSPITAL_COMMUNITY): Payer: Medicare Other | Admitting: Speech Pathology

## 2019-08-07 NOTE — Progress Notes (Signed)
Occupational Therapy Session Note  Patient Details  Name: Kyle Garcia MRN: [id number] Date of Birth: 01-04-1951  Today's Date: 08/07/2019 OT Individual Time: 1828-8337 OT Individual Time Calculation (min): 68 min    Short Term Goals: Week 3:  OT Short Term Goal 1 (Week 3): Pt will initiate bathing one body part with mod vc OT Short Term Goal 1 - Progress (Week 3): Not met OT Short Term Goal 2 (Week 3): Pt will attend to one grooming task with mod vc OT Short Term Goal 2 - Progress (Week 3): Not met OT Short Term Goal 3 (Week 3): Pt will don shirt with max A OT Short Term Goal 3 - Progress (Week 3): Met  Skilled Therapeutic Interventions/Progress Updates:    1:1. Pt received in TIS with no verbal report of pain however with ROM or even touching L extremities pt verbalizes "ow." Alerted RN to pain and need for medication as available. OT applies towel to leg rest and foam pad to pt shin d/t repetitive, restless swinging of RLE and placing it up/down on leg rest causing pressure on bony area. Pt completes 2x3 min dynavision requiring MAX HOH A for all scanning/reaching exept 3 instances where stimulus in lower R quadrant. Pt spontaneously reaches towards lights requiring mod VC for direction when undershooting. OT provides PROM of LUE and LE for muscle lengthening and decreasing risk of contracture. Pt returned to bed via total A lateral scoot transfer +2. Pt consumes thickened apple juice via tsp with 1 episode of coughing as pt attempts to talk with liquid in mouth prior to swallowing d/t decreased attention. Exited session with pt positioned in bed, exit alarm on and call light in reach  Therapy Documentation Precautions:  Precautions Precautions: Fall Precaution Comments: Dense Lt hemi, pusher Restrictions Weight Bearing Restrictions: No General:   Vital Signs:  Pain:   ADL: ADL Eating: Not assessed Grooming: Dependent(washing hands) Where Assessed-Grooming: Bed level Upper  Body Bathing: Dependent Where Assessed-Upper Body Bathing: Bed level Lower Body Bathing: Dependent Where Assessed-Lower Body Bathing: Bed level Upper Body Dressing: Dependent Where Assessed-Upper Body Dressing: Bed level Lower Body Dressing: Dependent Where Assessed-Lower Body Dressing: Bed level Toileting: Dependent Where Assessed-Toileting: Bed level Toilet Transfer: Not assessed Tub/Shower Transfer: Not assessed Vision   Perception    Praxis   Exercises:   Other Treatments:     Therapy/Group: Individual Therapy  [name] 08/07/2019, 10:47 AM

## 2019-08-07 NOTE — Plan of Care (Signed)
  Problem: Consults Goal: RH STROKE PATIENT EDUCATION Description: See Patient Education module for education specifics  Outcome: Progressing   Problem: RH BOWEL ELIMINATION Goal: RH STG MANAGE BOWEL WITH ASSISTANCE Description: STG Manage Bowel with MAX Assistance. Outcome: Progressing   Problem: RH BLADDER ELIMINATION Goal: RH STG MANAGE BLADDER WITH ASSISTANCE Description: STG Manage Bladder With MAX Assistance Outcome: Progressing   Problem: RH SAFETY Goal: RH STG ADHERE TO SAFETY PRECAUTIONS W/ASSISTANCE/DEVICE Description: STG Adhere to Safety Precautions With MIN Assistance/Device. Outcome: Progressing   Problem: RH PAIN MANAGEMENT Goal: RH STG PAIN MANAGED AT OR BELOW PT'S PAIN GOAL Description: AT OR BELOW LEVEL 4 Outcome: Progressing   Problem: RH KNOWLEDGE DEFICIT Goal: RH STG INCREASE KNOWLEDGE OF HYPERTENSION Description: Pt and wife will state understanding of HTN using medications, dietary restrictions, exercise and using handouts and educational resources independently Outcome: Progressing Goal: RH STG INCREASE KNOWLEDGE OF DYSPHAGIA/FLUID INTAKE Description: Pt and wife will state understanding of dysphagia using handouts and educational resources independently Outcome: Progressing Goal: RH STG INCREASE KNOWLEGDE OF HYPERLIPIDEMIA Description: Pt and wife will state understanding of HLD using medications, dietary restrictions, exercise and using handouts and educational resources independently Outcome: Progressing Goal: RH STG INCREASE KNOWLEDGE OF STROKE PROPHYLAXIS Description: Pt and wife will state understanding of secondary stroke using medications, dietary restrictions, exercise and using handouts and educational resources independently Outcome: Progressing

## 2019-08-07 NOTE — Progress Notes (Signed)
Social Work Patient ID: Kyle Garcia, male   DOB: 22-Feb-1951, 68 y.o.   MRN: [id number]   Have spoken with pt's wife today to confirm plans for Hospice referral.  Wife reports that she, patient and children are in agreement with this plan.  I have contacted [facility] (formerly [facility]) and placed referral with Lisabeth Pick who will reach out to wife.  Wife and daughter to be here on Monday at 1:00 to begin family education and aware would like to aim for d/c Tues/ Wednesday dependent on how these sessions go.  I will also speak further with Hospice about recommended DME.  Continue to follow.  Jola Critzer, LCSW

## 2019-08-07 NOTE — Progress Notes (Signed)
Speech Language Pathology Daily Session Note  Patient Details  Name: Anders Hohmann MRN: [id number] Date of Birth: [birth year]  Today's Date: 08/07/2019 SLP Individual Time: 1305-1400 SLP Individual Time Calculation (min): 55 min  Short Term Goals: Week 5: SLP Short Term Goal 1 (Week 5): Given Max A cues, pt will consume current diet with minimal overt s/s of aspiration. SLP Short Term Goal 2 (Week 5): Patient will demonstrate sustained attention to functional tasks for 5 minutes with Max A verbal cues for redirection. SLP Short Term Goal 3 (Week 5): Given Max A cues, pt will follow 1 step basic directions in 5 out of 10 opportunities. SLP Short Term Goal 4 (Week 5): Given Max A cues, patient will initiate functional tasks in 5 out of 10 opportunities.  Skilled Therapeutic Interventions: Skilled treatment session focused on cognitive and dysphagia goals. SLP facilitated session by initially providing supervision level verbal cues for swallow initiation, however, as patient became fatigued, he required overall Mod A. Patient initially demonstrated improved sustained attention to meal with minimal verbal expression but became more restless requiting increased cueing. Patient with intermittent language of confusion and required Max A verbal cues for sustained attention and problem solving while on a phone call with a family member. Patient left upright in bed with alarm on and all needs within reach. Continue with current plan of care.      Pain Pain Assessment Pain Scale: 0-10 Pain Score: 0-No pain Faces Pain Scale: No hurt  Therapy/Group: Individual Therapy  Chloris Marcoux 08/07/2019, 3:24 PM

## 2019-08-07 NOTE — Progress Notes (Signed)
[place] PHYSICAL MEDICINE & REHABILITATION PROGRESS NOTE   Subjective/Complaints: Up in bed. Therapy working on dressing him. No changes overnight  ROS: Limited due to cognitive/behavioral     Objective:   No results found. Recent Labs    08/05/19 0537  WBC 10.1  HGB 8.2*  HCT 26.1*  PLT 144*   Recent Labs    08/05/19 0537  NA 136  K 3.5  CL 105  CO2 22  GLUCOSE 105*  BUN 16  CREATININE 0.67  CALCIUM 7.9*    Intake/Output Summary (Last 24 hours) at 08/07/2019 1043 Last data filed at 08/06/2019 1900 Gross per 24 hour  Intake 240 ml  Output -  Net 240 ml     Physical Exam: Vital Signs Blood pressure 96/74, pulse (!) 111, temperature 98.5 F (36.9 C), resp. rate 18, height 5\' 11"  (1.803 m), weight 58.8 kg, SpO2 95 %. Constitutional: No distress . Vital signs reviewed. HEENT: EOMI, oral membranes moist Neck: supple Cardiovascular: RRR without murmur. No JVD    Respiratory: CTA Bilaterally without wheezes or rales. Normal effort    GI: BS +, non-tender, non-distended  Musc: Pain with LUE/LLE PROM again Neurologic: fairly alert  Slow processing Motor: RUE/RLE: Grossly 5/5  LUE/LLE: 0/5 Right gaze preference Left neglect Significant tone LUE/LLE-2/4 Psych: flat   Assessment/Plan: 1. Functional deficits secondary to Right MCA infarct with dysphagia  which require 3+ hours per day of interdisciplinary therapy in a comprehensive inpatient rehab setting.  Physiatrist is providing close team supervision and 24 hour management of active medical problems listed below.  Physiatrist and rehab team continue to assess barriers to discharge/monitor patient progress toward functional and medical goals  Care Tool:  Bathing    Body parts bathed by patient: Face, Right arm, Left arm, Abdomen, Chest, Front perineal area, Buttocks, Right upper leg, Left upper leg, Left lower leg, Right lower leg   Body parts bathed by helper: Right arm, Left arm, Abdomen, Front  perineal area, Buttocks, Left upper leg, Right lower leg, Left lower leg, Chest, Right upper leg, Face     Bathing assist Assist Level: Total Assistance - Patient < 25%     Upper Body Dressing/Undressing Upper body dressing   What is the patient wearing?: Pull over shirt    Upper body assist Assist Level: Dependent - Patient 0%    Lower Body Dressing/Undressing Lower body dressing      What is the patient wearing?: Pants, Incontinence brief     Lower body assist Assist for lower body dressing: Dependent - Patient 0%     Toileting Toileting    Toileting assist Assist for toileting: Dependent - Patient 0%     Transfers Chair/bed transfer  Transfers assist     Chair/bed transfer assist level: 2 Helpers     Locomotion Ambulation   Ambulation assist   Ambulation activity did not occur: Safety/medical concerns  Assist level: 2 helpers Assistive device: No Device Max distance: 10   Walk 10 feet activity   Assist  Walk 10 feet activity did not occur: Safety/medical concerns  Assist level: 2 helpers Assistive device: No Device   Walk 50 feet activity   Assist Walk 50 feet with 2 turns activity did not occur: Safety/medical concerns         Walk 150 feet activity   Assist Walk 150 feet activity did not occur: Safety/medical concerns         Walk 10 feet on uneven surface  activity  Assist Walk 10 feet on uneven surfaces activity did not occur: Safety/medical concerns         Wheelchair     Assist Will patient use wheelchair at discharge?: Yes Type of Wheelchair: Manual    Wheelchair assist level: Maximal Assistance - Patient 25 - 49% Max wheelchair distance: 20    Wheelchair 50 feet with 2 turns activity    Assist        Assist Level: Maximal Assistance - Patient 25 - 49%   Wheelchair 150 feet activity     Assist     Assist Level: Dependent - Patient 0%    Medical Problem List and Plan: 1.Dense  lefthemiparesis, now with spasticitywith dysphagiasecondary to embolic shower right MCA occlusion status post reperfusion. Infarct felt to be embolic secondary to hypercoagulable state from lung cancer  No plan for further chemo  Continue CIR to tolerance  -family has spoke with palliative care and now plan is for home hospice  -APPRECIATE PALLIATIVE CARE TEAM'S HELP! 2. Antithrombotics: -DVT/anticoagulation/history of right lower extremity DVT: Eliquis -antiplatelet therapy: N/A 3. Pain Management/spastic left hemiparesis:Tylenol only as we're trying to avoid oversedation   -antispasmodics   -have stopped baclofen    -Orthosis  -kpad for back 4. Mood:Provide emotional support -antipsychotic agents: N/A  -holding ritalin 5. Neuropsych: This patientis notcapable of making decisions on hisown behalf. 6. Skin/Wound Care:Routine skin checks 7. Fluids/Electrolytes/Nutrition:   -resumed IVF ---continue to support BP  -BUN/Cr stable on 8/12 8.Stage IV adenocarcinoma right lung. Chemotherapy as per [name]  -onc appts on temporary hold until after rehab discharge 9. Aspiration pneumonia, low grade temp.   Unasyn completed  WBCs 10.1 on 8/12 Aspiration precautions  CXR with persistent opacities, afebrile  -ucx only 10k  -blood cx NG 10.  Post stroke dysphagia.   D1 honeys  11. Hyperlipidemia. Crestor 12. Remote tobacco abuse. Counseling 13.Acute on chronic anemia.   Hemoglobin 8.2 on 8/12  Continue to monitor  14 Hypocalcemia-stable at 8.4 on 7/30  -likely related to #8 15.     Tachycardia: has had since admit--110's to 120's-   LOS: 28 days A FACE TO [facility] 08/07/2019, 10:43 AM

## 2019-08-07 NOTE — Progress Notes (Signed)
Physical Therapy Session Note  Patient Details  Name: Kyle Garcia MRN: [id number] Date of Birth: Nov 08, 1951  Today's Date: 08/07/2019 PT Individual Time: 6754-4920 PT Individual Time Calculation (min): 54 min   Short Term Goals: Week 5:  PT Short Term Goal 1 (Week 5): STG = LTG due to estimated d/c date.  Skilled Therapeutic Interventions/Progress Updates:  Pt received in bed, requires total assist for rolling L<>R to allow therapist to don shorts & pull them over hips. Provided total assist for donning socks & shoes. Pt with slurred speech & appearing to have a lot of saliva in mouth - therapist provided total assist for oral suction & pt requires continued suction throughout session 2/2 inability to manage secretions. MD assessed pt at beginning of session.  Pt transferred supine>sitting EOB with total assist, bed>w/c with +2 assist via slide board. Assisted pt with donning clean shirt dependent assist. Pt set up at sink & engaged in grooming tasks (cleaning nails, washing hands) while focusing on attention to task and attention to midline. Pt hungry but no breakfast tray available so provided pt with magic cup with focus on pt scanning to midline/slightly L of midline to locate spoon as therapist feeds him dependent assist. Also provided thickened liquid via spoon dependent assist. At end of session pt left in TIS w/c with chair alarm & seat belt donned, lap tray donned, & call bell in reach. Pt slightly more restless today so left slightly tilted back in w/c.  Therapy Documentation Precautions:  Precautions Precautions: Fall Precaution Comments: Dense Lt hemi, pusher Restrictions Weight Bearing Restrictions: No  Pain: Pt continues to demonstrate behaviors & report c/o pain with movement (bed mobility & extending L elbow) with repositioning provided for comfort.  Pt with c/o "my neck hurts" & pain meds requested from RN & she administered them at beginning of session.    Therapy/Group:  Individual Therapy  [name] 08/07/2019, 9:01 AM

## 2019-08-08 NOTE — Plan of Care (Signed)
  Problem: Consults Goal: RH STROKE PATIENT EDUCATION Description: See Patient Education module for education specifics  Outcome: Progressing   Problem: RH BOWEL ELIMINATION Goal: RH STG MANAGE BOWEL WITH ASSISTANCE Description: STG Manage Bowel with MAX Assistance. Outcome: Progressing   Problem: RH BLADDER ELIMINATION Goal: RH STG MANAGE BLADDER WITH ASSISTANCE Description: STG Manage Bladder With MAX Assistance Outcome: Progressing   Problem: RH SAFETY Goal: RH STG ADHERE TO SAFETY PRECAUTIONS W/ASSISTANCE/DEVICE Description: STG Adhere to Safety Precautions With MIN Assistance/Device. Outcome: Progressing   Problem: RH PAIN MANAGEMENT Goal: RH STG PAIN MANAGED AT OR BELOW PT'S PAIN GOAL Description: AT OR BELOW LEVEL 4 Outcome: Progressing   Problem: RH KNOWLEDGE DEFICIT Goal: RH STG INCREASE KNOWLEDGE OF HYPERTENSION Description: Pt and wife will state understanding of HTN using medications, dietary restrictions, exercise and using handouts and educational resources independently Outcome: Progressing Goal: RH STG INCREASE KNOWLEDGE OF DYSPHAGIA/FLUID INTAKE Description: Pt and wife will state understanding of dysphagia using handouts and educational resources independently Outcome: Progressing Goal: RH STG INCREASE KNOWLEGDE OF HYPERLIPIDEMIA Description: Pt and wife will state understanding of HLD using medications, dietary restrictions, exercise and using handouts and educational resources independently Outcome: Progressing Goal: RH STG INCREASE KNOWLEDGE OF STROKE PROPHYLAXIS Description: Pt and wife will state understanding of secondary stroke using medications, dietary restrictions, exercise and using handouts and educational resources independently Outcome: Progressing

## 2019-08-08 NOTE — Progress Notes (Signed)
[place] PHYSICAL MEDICINE & REHABILITATION PROGRESS NOTE   Subjective/Complaints:  No issues overnite , pt states he enjoys therapy, no pain c/o no SOB noted   ROS: Limited due to cognitive/behavioral     Objective:   No results found. No results for input(s): WBC, HGB, HCT, PLT in the last 72 hours. No results for input(s): NA, K, CL, CO2, GLUCOSE, BUN, CREATININE, CALCIUM in the last 72 hours.  Intake/Output Summary (Last 24 hours) at 08/08/2019 1024 Last data filed at 08/08/2019 0908 Gross per 24 hour  Intake 3795.75 ml  Output -  Net 3795.75 ml     Physical Exam: Vital Signs Blood pressure 105/66, pulse (!) 108, temperature 98.9 F (37.2 C), temperature source Oral, resp. rate 20, height 5\' 11"  (1.803 m), weight 58.8 kg, SpO2 96 %. Constitutional: No distress . Vital signs reviewed. HEENT: EOMI, oral membranes moist Neck: supple Cardiovascular: RRR without murmur. No JVD    Respiratory: CTA Bilaterally without wheezes or rales. Normal effort    GI: BS +, non-tender, non-distended  Musc: Pain with LUE/LLE PROM again Neurologic: fairly alert  Slow processing Motor: RUE/RLE: Grossly 5/5  LUE/LLE: 0/5 Right gaze preference Left neglect Significant tone LUE/LLE-2/4 Psych: flat   Assessment/Plan: 1. Functional deficits secondary to Right MCA infarct with dysphagia  which require 3+ hours per day of interdisciplinary therapy in a comprehensive inpatient rehab setting.  Physiatrist is providing close team supervision and 24 hour management of active medical problems listed below.  Physiatrist and rehab team continue to assess barriers to discharge/monitor patient progress toward functional and medical goals  Care Tool:  Bathing    Body parts bathed by patient: Face, Right arm, Left arm, Abdomen, Chest, Front perineal area, Buttocks, Right upper leg, Left upper leg, Left lower leg, Right lower leg   Body parts bathed by helper: Right arm, Left arm, Abdomen,  Front perineal area, Buttocks, Left upper leg, Right lower leg, Left lower leg, Chest, Right upper leg, Face     Bathing assist Assist Level: Total Assistance - Patient < 25%     Upper Body Dressing/Undressing Upper body dressing   What is the patient wearing?: Pull over shirt    Upper body assist Assist Level: Dependent - Patient 0%    Lower Body Dressing/Undressing Lower body dressing      What is the patient wearing?: Pants, Incontinence brief     Lower body assist Assist for lower body dressing: Dependent - Patient 0%     Toileting Toileting    Toileting assist Assist for toileting: Dependent - Patient 0%     Transfers Chair/bed transfer  Transfers assist     Chair/bed transfer assist level: 2 Helpers     Locomotion Ambulation   Ambulation assist   Ambulation activity did not occur: Safety/medical concerns  Assist level: 2 helpers Assistive device: No Device Max distance: 10   Walk 10 feet activity   Assist  Walk 10 feet activity did not occur: Safety/medical concerns  Assist level: 2 helpers Assistive device: No Device   Walk 50 feet activity   Assist Walk 50 feet with 2 turns activity did not occur: Safety/medical concerns         Walk 150 feet activity   Assist Walk 150 feet activity did not occur: Safety/medical concerns         Walk 10 feet on uneven surface  activity   Assist Walk 10 feet on uneven surfaces activity did not occur: Safety/medical concerns  Wheelchair     Assist Will patient use wheelchair at discharge?: Yes Type of Wheelchair: Manual    Wheelchair assist level: Maximal Assistance - Patient 25 - 49% Max wheelchair distance: 20    Wheelchair 50 feet with 2 turns activity    Assist        Assist Level: Maximal Assistance - Patient 25 - 49%   Wheelchair 150 feet activity     Assist     Assist Level: Dependent - Patient 0%    Medical Problem List and Plan: 1.Dense  lefthemiparesis, now with spasticitywith dysphagiasecondary to embolic shower right MCA occlusion status post reperfusion. Infarct felt to be embolic secondary to hypercoagulable state from lung cancer    Continue CIR to tolerance  -family has spoke with palliative care and now plan is for home hospice   2. Antithrombotics: -DVT/anticoagulation/history of right lower extremity DVT: Eliquis -antiplatelet therapy: N/A 3. Pain Management/spastic left hemiparesis:Tylenol only as we're trying to avoid oversedation   -antispasmodics   -have stopped baclofen    -Orthosis  -kpad for back 4. Mood:Provide emotional support -antipsychotic agents: N/A  -holding ritalin 5. Neuropsych: This patientis notcapable of making decisions on hisown behalf. 6. Skin/Wound Care:Routine skin checks 7. Fluids/Electrolytes/Nutrition: intake is 25-75% of meals   -resumed IVF ---continue to support BP  -BUN/Cr stable on 8/12 8.Stage IV adenocarcinoma right lung. Chemotherapy as per [name]  -onc appts on temporary hold until after rehab discharge No plan for further chemo 9. Aspiration pneumonia,resolved 10.  Post stroke dysphagia.   D1 honeys  11. Hyperlipidemia. Crestor 12. Remote tobacco abuse. Counseling 13.Acute on chronic anemia.   Hemoglobin 8.2 on 8/12  Continue to monitor  14 Hypocalcemia-stable at 8.4 on 7/30  -likely related to #8 15.     Tachycardia: has had since admit--110's to 120's- Vitals:   08/07/19 1950 08/08/19 0617  BP: 111/75 105/66  Pulse: 97 (!) 108  Resp: 19 20  Temp: 98.3 F (36.8 C) 98.9 F (37.2 C)  SpO2: 98% 96%  BP on low side would avoid BB  LOS: 29 days A FACE TO FACE EVALUATION WAS PERFORMED  Charlett Blake 08/08/2019, 10:24 AM

## 2019-08-08 NOTE — Progress Notes (Signed)
Manufacturing engineer [facility]) Hospice  Received request from [place] for family interest in hospice services at home after discharge. Spoke with spouse Kyle Garcia by phone. She would like a follow up call tomorrow when her son and daughter can be present. [facility] hospital liaison will follow up tomorrow morning to determine what time. Note plan for discharge Tuesday or Wednesday.   Thank you,  Heloise Purpura  [phone]  [facility] hospital liaisons are listed daily on AMION under Hospice and [place]

## 2019-08-09 NOTE — Progress Notes (Signed)
[place] PHYSICAL MEDICINE & REHABILITATION PROGRESS NOTE   Subjective/Complaints:  " I have Dr Posey Pronto for Covid tomorrow" Oriented to person and place not time  ROS: Limited due to cognitive/behavioral     Objective:   No results found. No results for input(s): WBC, HGB, HCT, PLT in the last 72 hours. No results for input(s): NA, K, CL, CO2, GLUCOSE, BUN, CREATININE, CALCIUM in the last 72 hours.  Intake/Output Summary (Last 24 hours) at 08/09/2019 1027 Last data filed at 08/09/2019 0600 Gross per 24 hour  Intake 1270 ml  Output -  Net 1270 ml     Physical Exam: Vital Signs Blood pressure 108/67, pulse (!) 105, temperature 98.6 F (37 C), resp. rate 19, height 5\' 11"  (1.803 m), weight 58.8 kg, SpO2 100 %. Constitutional: No distress . Vital signs reviewed. HEENT: EOMI, oral membranes moist Neck: supple Cardiovascular: RRR without murmur. No JVD    Respiratory: CTA Bilaterally without wheezes or rales. Normal effort    GI: BS +, non-tender, non-distended  Musc: Pain with LUE/LLE PROM again Neurologic: fairly alert  Slow processing Motor: RUE/RLE: Grossly 5/5  LUE/LLE: 0/5 Right gaze preference Left neglect Significant tone LUE/LLE-2/4 Psych:smiling , alert  No lability    Assessment/Plan: 1. Functional deficits secondary to Right MCA infarct with dysphagia  which require 3+ hours per day of interdisciplinary therapy in a comprehensive inpatient rehab setting.  Physiatrist is providing close team supervision and 24 hour management of active medical problems listed below.  Physiatrist and rehab team continue to assess barriers to discharge/monitor patient progress toward functional and medical goals  Care Tool:  Bathing    Body parts bathed by patient: Face, Right arm, Left arm, Abdomen, Chest, Front perineal area, Buttocks, Right upper leg, Left upper leg, Left lower leg, Right lower leg   Body parts bathed by helper: Right arm, Left arm, Abdomen, Front  perineal area, Buttocks, Left upper leg, Right lower leg, Left lower leg, Chest, Right upper leg, Face     Bathing assist Assist Level: Total Assistance - Patient < 25%     Upper Body Dressing/Undressing Upper body dressing   What is the patient wearing?: Pull over shirt    Upper body assist Assist Level: Dependent - Patient 0%    Lower Body Dressing/Undressing Lower body dressing      What is the patient wearing?: Pants, Incontinence brief     Lower body assist Assist for lower body dressing: Dependent - Patient 0%     Toileting Toileting    Toileting assist Assist for toileting: Dependent - Patient 0%     Transfers Chair/bed transfer  Transfers assist     Chair/bed transfer assist level: 2 Helpers     Locomotion Ambulation   Ambulation assist   Ambulation activity did not occur: Safety/medical concerns  Assist level: 2 helpers Assistive device: No Device Max distance: 10   Walk 10 feet activity   Assist  Walk 10 feet activity did not occur: Safety/medical concerns  Assist level: 2 helpers Assistive device: No Device   Walk 50 feet activity   Assist Walk 50 feet with 2 turns activity did not occur: Safety/medical concerns         Walk 150 feet activity   Assist Walk 150 feet activity did not occur: Safety/medical concerns         Walk 10 feet on uneven surface  activity   Assist Walk 10 feet on uneven surfaces activity did not occur: Safety/medical concerns  Wheelchair     Assist Will patient use wheelchair at discharge?: Yes Type of Wheelchair: Manual    Wheelchair assist level: Maximal Assistance - Patient 25 - 49% Max wheelchair distance: 20    Wheelchair 50 feet with 2 turns activity    Assist        Assist Level: Maximal Assistance - Patient 25 - 49%   Wheelchair 150 feet activity     Assist     Assist Level: Dependent - Patient 0%    Medical Problem List and Plan: 1.Dense  lefthemiparesis, now with spasticitywith dysphagiasecondary to embolic shower right MCA occlusion status post reperfusion. Infarct felt to be embolic secondary to hypercoagulable state from lung cancer    Continue CIR PT, OT, SLP 15/7  -family has spoke with palliative care and now plan is for home hospice   2. Antithrombotics: -DVT/anticoagulation/history of right lower extremity DVT: Eliquis -antiplatelet therapy: N/A 3. Pain Management/spastic left hemiparesis:Tylenol only as we're trying to avoid oversedation   -antispasmodics   -have stopped baclofen    -Orthosis  -kpad for back 4. Mood:Provide emotional support -antipsychotic agents: N/A  -holding ritalin 5. Neuropsych: This patientis notcapable of making decisions on hisown behalf. 6. Skin/Wound Care:Routine skin checks 7. Fluids/Electrolytes/Nutrition: intake is 25-75% of meals   -resumed IVF ---continue to support BP  -BUN/Cr stable on 8/12 8.Stage IV adenocarcinoma right lung. Chemotherapy as per [name]  -onc appts on temporary hold until after rehab discharge No plan for further chemo 9. Aspiration pneumonia,resolved 10.  Post stroke dysphagia.   D1 honeys  11. Hyperlipidemia. Crestor 12. Remote tobacco abuse. Counseling 13.Acute on chronic anemia.   Hemoglobin 8.2 on 8/12  Continue to monitor  14 Hypocalcemia-stable at 8.4 on 7/30  -likely related to #8 15.     Tachycardia: has had since admit--110's to 120's- Vitals:   08/08/19 1926 08/09/19 0430  BP: 107/71 108/67  Pulse: (!) 112 (!) 105  Resp: 20 19  Temp: 99.7 F (37.6 C) 98.6 F (37 C)  SpO2: 98% 100%  BP on low side would avoid BB  LOS: 30 days A FACE TO FACE EVALUATION WAS PERFORMED  Charlett Blake 08/09/2019, 10:27 AM

## 2019-08-09 NOTE — Plan of Care (Signed)
  Problem: Consults Goal: RH STROKE PATIENT EDUCATION Description: See Patient Education module for education specifics  Outcome: Progressing   Problem: RH PAIN MANAGEMENT Goal: RH STG PAIN MANAGED AT OR BELOW PT'S PAIN GOAL Description: AT OR BELOW LEVEL 4 Outcome: Progressing   Problem: RH BOWEL ELIMINATION Goal: RH STG MANAGE BOWEL WITH ASSISTANCE Description: STG Manage Bowel with MAX Assistance. Outcome: Not Progressing   Problem: RH BLADDER ELIMINATION Goal: RH STG MANAGE BLADDER WITH ASSISTANCE Description: STG Manage Bladder With MAX Assistance Outcome: Not Progressing   Problem: RH SAFETY Goal: RH STG ADHERE TO SAFETY PRECAUTIONS W/ASSISTANCE/DEVICE Description: STG Adhere to Safety Precautions With MIN Assistance/Device. Outcome: Not Progressing   Problem: RH KNOWLEDGE DEFICIT Goal: RH STG INCREASE KNOWLEDGE OF HYPERTENSION Description: Pt and wife will state understanding of HTN using medications, dietary restrictions, exercise and using handouts and educational resources independently Outcome: Not Progressing Goal: RH STG INCREASE KNOWLEDGE OF DYSPHAGIA/FLUID INTAKE Description: Pt and wife will state understanding of dysphagia using handouts and educational resources independently Outcome: Not Progressing Goal: RH STG INCREASE KNOWLEGDE OF HYPERLIPIDEMIA Description: Pt and wife will state understanding of HLD using medications, dietary restrictions, exercise and using handouts and educational resources independently Outcome: Not Progressing Goal: RH STG INCREASE KNOWLEDGE OF STROKE PROPHYLAXIS Description: Pt and wife will state understanding of secondary stroke using medications, dietary restrictions, exercise and using handouts and educational resources independently Outcome: Not Progressing

## 2019-08-09 NOTE — Progress Notes (Signed)
Manufacturing engineer [facility]) Hospice  Contacted spouse and son to continue education and answer questions about hospice services at home.    Wife has many questions about her ability to care for him.  Offered support and listened.  She wants to continue IV fluids, and mentioned PEG placement.  Discussed that IV fluids are not typically in the plan of care for hospice.  She acknowledges and states that she will pay for it out of pocket.  We discussed PEG placement.  She wanted some advice on how to proceed.  Discussed that it will in essence prolong his life, and that is a decision that only they can make on his behalf.    [name] has my number and was advised she could call tomorrow after she has a meeting with the rehab team in the am.  They may need further [place] discussions prior to discharging home.  Venia Carbon RN, BSN, [facility] Liaison (in [place]) [phone]

## 2019-08-10 ENCOUNTER — Encounter (HOSPITAL_COMMUNITY): Payer: Medicare Other

## 2019-08-10 ENCOUNTER — Encounter (HOSPITAL_COMMUNITY): Payer: Medicare Other | Admitting: Speech Pathology

## 2019-08-10 ENCOUNTER — Ambulatory Visit (HOSPITAL_COMMUNITY): Payer: Medicare Other | Admitting: Physical Therapy

## 2019-08-10 DIAGNOSIS — T17908A Unspecified foreign body in respiratory tract, part unspecified causing other injury, initial encounter: Secondary | ICD-10-CM

## 2019-08-10 NOTE — Progress Notes (Signed)
Speech Language Pathology Daily Session Note  Patient Details  Name: Kyle Garcia MRN: [id number] Date of Birth: 1951/12/03  Today's Date: 08/10/2019 SLP Individual Time: 1400-1500 SLP Individual Time Calculation (min): 60 min  Short Term Goals: Week 5: SLP Short Term Goal 1 (Week 5): Given Max A cues, pt will consume current diet with minimal overt s/s of aspiration. SLP Short Term Goal 2 (Week 5): Patient will demonstrate sustained attention to functional tasks for 5 minutes with Max A verbal cues for redirection. SLP Short Term Goal 3 (Week 5): Given Max A cues, pt will follow 1 step basic directions in 5 out of 10 opportunities. SLP Short Term Goal 4 (Week 5): Given Max A cues, patient will initiate functional tasks in 5 out of 10 opportunities.  Skilled Therapeutic Interventions: Skilled treatment session focused on family education with the patient, his wife and his daughter. SLP facilitated session by providing education in regards to patient's current swallowing function, diet recommendations, appropriate textures, swallowing compensatory strategies and how to thicken liquids. SLP also provided demonstration for appropriate cues with lunch meal of Dys. 1 textures with honey-thick liquids via tsp. Patient consumed meal with minimal overt s/s of aspiration and Mod verbal cues for use of swallowing compensatory strategies. All questions were answered at this time. Patient left upright in bed with all needs within reach and wife present. Continue with current plan of care.      Pain No/Denies Pain   Therapy/Group: Individual Therapy  Nazair Fortenberry 08/10/2019, 3:05 PM

## 2019-08-10 NOTE — Progress Notes (Signed)
Physical Therapy Session Note  Patient Details  Name: Kyle Garcia MRN: [id number] Date of Birth: 14-Aug-1951  Today's Date: 08/10/2019 PT Individual Time: 1305-1400 PT Individual Time Calculation (min): 55 min   Short Term Goals: Week 5:  PT Short Term Goal 1 (Week 5): STG = LTG due to estimated d/c date.  Skilled Therapeutic Interventions/Progress Updates:  Pt received in bed with brief half off and pt with incontinent BM & void & pt unaware. PT & RN assisted pt with rolling L<>R with +2 assist for dependent peri hygiene and donning clean brief. Pt's family (wife [name] & daughter [name]) arrived for family ed. Educated them & provided demon with wife assisting with rolling pt in bed with bed flat to don pants dependent assist. Session focused on educating family re: pt's care as they wish to take him home with hospice. Educated them on the following: need for pt to sit upright in bed & limit HOB being flat to limit pt aspirating on saliva, use of chuck pads to reposition/roll pt in bed, use & how to don L PRAFO & L hand splint, briefs they can use at home if pt unable to tell them when he needs to have a BM/void, recommendation for non emergent EMS transport home, pt's inability to tolerate transferring or sitting in car, pt's inability to sit in w/c unless he's in TIS w/c (family looking into purchasing one in order to take him to back porch/outside) use of 2 vs 4 bed rails to prevent pt from falling OOB, ability to place mats on floor beside bed at home, need to adjust hospital bed at home to ensure they're using good body mechanics, and use of hoyer lift to at least get pt OOB to allow them to change bed sheets. Pt's family would benefit from several additional days of caregiver training so they can participate in hands on care as today was spent verbally educating them. Family also has several medical questions that this PT deferred to medical team. Pt left in bed in handoff to SLP with family  present.   Therapy Documentation Precautions:  Precautions Precautions: Fall Precaution Comments: Dense Lt hemi, pusher Restrictions Weight Bearing Restrictions: No  Pain: Pt crying out & with behaviors demonstrating pain while performing bed mobility with rest breaks & repositioning provided for comfort.    Therapy/Group: Individual Therapy  [name] 08/10/2019, 2:37 PM

## 2019-08-10 NOTE — Progress Notes (Addendum)
Spoke with patient at bedside.  Discussed feeding tube.  Talked about quantity of life vs quality of life.  Patient didn't hesitate.  He wants a good quality of life rather than a longer life that is less comfortable.  Added [name], his wife, on by conference call.  Janet's mother had a feeding tube placed when she had dementia.  [name] asked "Does he really have to have a feeding tube?"   I explained that feeding tubes provide hydration and nutrition but they also come with the cost of a hole in your abdomen.  Jaryd is aspirating on his saliva.  A feeding tube will not prevent and may worsen aspiration.  I told [name] that Mikki Santee does not have to have a feeding tube.  He can be carefully hand fed.  We discussed good oral care, bacteria reduction and aspiration prevention. We discussed IV fluids as well.    At one point [place] commented that she feels overwhelmed.  She is concerned about being able to provide enough care for him in the home.  Her plan is to take him home with Hospice and hire private care givers as well.  I provided reassurance and explained that the decisions she is making now are not a commitment.  She can and should reassess after being at home for several days or a week.  I gave her my number to call for support.  I also advised her to consult with her Hospice Social Worker for conversation about options and outcomes.  Assessment:  Patient stable after large stroke.  He is completely dependent for care.  The team at [facility] has been working hard to keep his legs from contracting.  He coughs when speaking on his own saliva.  Am concerned for future aspiration pneumonia.  Recommendations:   Home with Hospice and private care givers.  No PEG tube.  No IVF at home.  PMT will continue to chart check.  Please call us if we can be of assistance.  Florentina Jenny, PA-C Palliative Medicine Pager: [phone]  PMT call:  [phone] (7 am - 7 pm).  Time 25 min.

## 2019-08-10 NOTE — Progress Notes (Signed)
Occupational Therapy Session Note  Patient Details  Name: Kyle Garcia MRN: [id number] Date of Birth: [birth year]  Today's Date: 08/10/2019 OT Individual Time: 1515-1640 OT Individual Time Calculation (min): 85 min    Short Term Goals: Week 4:  OT Short Term Goal 1 (Week 4): STG= LTG d/t ELOS  Skilled Therapeutic Interventions/Progress Updates:    Pt received supine with wife and daughter present for family education session. Extensive education provided re bed level care, including strategies to maintain sanitation, promote proper skin integrity, maintain body mechanics, and positioning. Demonstrated how to don diaper at bed level and how to position bed pan. Extensive problem solving with family re possibility of showering and transfer into shower. Demonstrated PROM for LUE and LLE. Demonstrated rolling pt in bed and discussed pain in back. Pt was transferred to EOB with total A, with multiple rest breaks provided for pain. While EOB pt required max A for static sitting balance. Pt required transfer back to bed d/t progressive pain and fatigue. Family had multiple questions re care and all were answered throughout session. Multiple discussions re hospice care and what will be provided. Pt was left supine with all needs met, family present.   Therapy Documentation Precautions:  Precautions Precautions: Fall Precaution Comments: Dense Lt hemi, pusher Restrictions Weight Bearing Restrictions: No   Therapy/Group: Individual Therapy  [name] 08/10/2019, 7:13 AM

## 2019-08-10 NOTE — Progress Notes (Signed)
[place] PHYSICAL MEDICINE & REHABILITATION PROGRESS NOTE   Subjective/Complaints:  Up in bed eating breafkast. No new issues  ROS: Limited due to cognitive/behavioral     Objective:   No results found. No results for input(s): WBC, HGB, HCT, PLT in the last 72 hours. No results for input(s): NA, K, CL, CO2, GLUCOSE, BUN, CREATININE, CALCIUM in the last 72 hours.  Intake/Output Summary (Last 24 hours) at 08/10/2019 1041 Last data filed at 08/10/2019 0700 Gross per 24 hour  Intake 1448.33 ml  Output -  Net 1448.33 ml     Physical Exam: Vital Signs Blood pressure 104/63, pulse (!) 109, temperature 98 F (36.7 C), temperature source Oral, resp. rate 16, height 5\' 11"  (1.803 m), weight 58.8 kg, SpO2 94 %. Constitutional: No distress . Vital signs reviewed. HEENT: EOMI, oral membranes moist Neck: supple Cardiovascular: RRR without murmur. No JVD    Respiratory: CTA Bilaterally without wheezes or rales. Normal effort    GI: BS +, non-tender, non-distended  Musc: Pain with LUE/LLE PROM again Neurologic: fairly alert  Slow processing Motor: RUE/RLE: Grossly 5/5  LUE/LLE: 0/5 Right gaze preference Left neglect Significant tone LUE/LLE-2/4---no change in neuro exam Psych:smiling , alert  No lability    Assessment/Plan: 1. Functional deficits secondary to Right MCA infarct with dysphagia  which require 3+ hours per day of interdisciplinary therapy in a comprehensive inpatient rehab setting.  Physiatrist is providing close team supervision and 24 hour management of active medical problems listed below.  Physiatrist and rehab team continue to assess barriers to discharge/monitor patient progress toward functional and medical goals  Care Tool:  Bathing    Body parts bathed by patient: Face, Right arm, Left arm, Abdomen, Chest, Front perineal area, Buttocks, Right upper leg, Left upper leg, Left lower leg, Right lower leg   Body parts bathed by helper: Right arm, Left arm,  Abdomen, Front perineal area, Buttocks, Left upper leg, Right lower leg, Left lower leg, Chest, Right upper leg, Face     Bathing assist Assist Level: Total Assistance - Patient < 25%     Upper Body Dressing/Undressing Upper body dressing   What is the patient wearing?: Pull over shirt    Upper body assist Assist Level: Dependent - Patient 0%    Lower Body Dressing/Undressing Lower body dressing      What is the patient wearing?: Pants, Incontinence brief     Lower body assist Assist for lower body dressing: Dependent - Patient 0%     Toileting Toileting    Toileting assist Assist for toileting: Dependent - Patient 0%     Transfers Chair/bed transfer  Transfers assist     Chair/bed transfer assist level: 2 Helpers     Locomotion Ambulation   Ambulation assist   Ambulation activity did not occur: Safety/medical concerns  Assist level: 2 helpers Assistive device: No Device Max distance: 10   Walk 10 feet activity   Assist  Walk 10 feet activity did not occur: Safety/medical concerns  Assist level: 2 helpers Assistive device: No Device   Walk 50 feet activity   Assist Walk 50 feet with 2 turns activity did not occur: Safety/medical concerns         Walk 150 feet activity   Assist Walk 150 feet activity did not occur: Safety/medical concerns         Walk 10 feet on uneven surface  activity   Assist Walk 10 feet on uneven surfaces activity did not occur: Safety/medical concerns  Wheelchair     Assist Will patient use wheelchair at discharge?: Yes Type of Wheelchair: Manual    Wheelchair assist level: Maximal Assistance - Patient 25 - 49% Max wheelchair distance: 20    Wheelchair 50 feet with 2 turns activity    Assist        Assist Level: Maximal Assistance - Patient 25 - 49%   Wheelchair 150 feet activity     Assist     Assist Level: Dependent - Patient 0%    Medical Problem List and  Plan: 1.Dense lefthemiparesis, now with spasticitywith dysphagiasecondary to embolic shower right MCA occlusion status post reperfusion. Infarct felt to be embolic secondary to hypercoagulable state from lung cancer    Continue CIR PT, OT, SLP 15/7  -home hospice this week, family ed prior  -no feeding tube or IVF   -appreciate palliative care help 2. Antithrombotics: -DVT/anticoagulation/history of right lower extremity DVT: Eliquis -antiplatelet therapy: N/A 3. Pain Management/spastic left hemiparesis:Tylenol only as we're trying to avoid oversedation   -antispasmodics   -have stopped baclofen    -Orthosis  -kpad for back 4. Mood:Provide emotional support -antipsychotic agents: N/A  -holding ritalin 5. Neuropsych: This patientis notcapable of making decisions on hisown behalf. 6. Skin/Wound Care:Routine skin checks 7. Fluids/Electrolytes/Nutrition: intake is 25-75% of meals   -resumed IVF ---continue to support BP  -BUN/Cr stable on 8/12 8.Stage IV adenocarcinoma right lung. Chemotherapy as per [name]  -onc appts on temporary hold until after rehab discharge No plan for further chemo 9. Aspiration pneumonia,resolved 10.  Post stroke dysphagia.   D1 honeys  11. Hyperlipidemia. Crestor 12. Remote tobacco abuse. Counseling 13.Acute on chronic anemia.   Hemoglobin 8.2 on 8/12  Continue to monitor  14 Hypocalcemia-stable at 8.4 on 7/30  -likely related to #8 15.     Tachycardia: has had since admit--110's to 120's- Vitals:   08/09/19 1924 08/10/19 0558  BP: 114/70 104/63  Pulse: (!) 114 (!) 109  Resp: 19 16  Temp: 98.9 F (37.2 C) 98 F (36.7 C)  SpO2: 98% 94%  BP on low side would avoid BB  LOS: 31 days A FACE TO [place] 08/10/2019, 10:41 AM

## 2019-08-10 NOTE — Progress Notes (Signed)
Nutrition Follow-up  RD working remotely.  DOCUMENTATION CODES:   Underweight, Severe malnutrition in context of chronic illness  INTERVENTION:   - Continue Magic cup TID with meals, each supplement provides 290 kcal and 9 grams of protein  - Continue MVI with minerals  NUTRITION DIAGNOSIS:   Severe Malnutrition related to chronic illness (stage IV lung cancer, recurrent strokes, dysphagia) as evidenced by severe fat depletion, severe muscle depletion, percent weight loss (7.2% weight loss in less than 1 month).  Ongoing, being addressed via oral nutrition supplements  GOAL:   Patient will meet greater than or equal to 90% of their needs  Progressing  MONITOR:   PO intake, Supplement acceptance, Labs, Weight trends, Diet advancement, I & O's, Skin  REASON FOR ASSESSMENT:   Malnutrition Screening Tool    ASSESSMENT:   68 y/o male HLD, GERD, Stage IV lung cancer and recurrent strokes. Presents to CIR following hospitalization 9/29-5/74 for embolic shower and R MCA occlusion s/p attempted mechanical thrombectomy of multiple vessels.  7/31 - diet advanced to Dysphagia 2 with nectar-thick liquids 8/06 - diet downgraded to Dysphagia 1, honey-thick liquids  No new weights since 8/08.  Family is planning for home hospice with private caregivers. Reviewed palliative care team note from this morning. No PEG tube and no IVF at home.  Per RN edema assessment, pt with non-pitting edema to LLE.  Meal Completion: 5-100% (varable, averaging 38%)  Medications reviewed and include: Oscal with D, folic acid, MVI with minerals  Labs reviewed: hemoglobin 8.2  Diet Order:   Diet Order            DIET - DYS 1 Room service appropriate? Yes; Fluid consistency: Honey Thick  Diet effective now              EDUCATION NEEDS:   No education needs have been identified at this time  Skin:  Skin Assessment: Reviewed RN Assessment (MASD to groin)  Last BM:  08/10/19  Height:    Ht Readings from Last 1 Encounters:  07/20/19 5\' 11"  (1.803 m)    Weight:   Wt Readings from Last 1 Encounters:  08/01/19 58.8 kg    Ideal Body Weight:  78.2 kg  BMI:  Body mass index is 18.08 kg/m.  Estimated Nutritional Needs:   Kcal:  2000-2200  Protein:  100-115 grams  Fluid:  >/= 2.0 L    Gaynell Face, MS, RD, LDN Inpatient Clinical Dietitian Pager: [phone] Weekend/After Hours: [phone]

## 2019-08-11 ENCOUNTER — Encounter (HOSPITAL_COMMUNITY): Payer: Medicare Other | Admitting: Speech Pathology

## 2019-08-11 ENCOUNTER — Ambulatory Visit (HOSPITAL_COMMUNITY): Payer: Medicare Other | Admitting: Physical Therapy

## 2019-08-11 ENCOUNTER — Inpatient Hospital Stay (HOSPITAL_COMMUNITY): Payer: Medicare Other

## 2019-08-11 ENCOUNTER — Inpatient Hospital Stay (HOSPITAL_COMMUNITY): Payer: Medicare Other | Admitting: Physical Therapy

## 2019-08-11 DIAGNOSIS — T17908A Unspecified foreign body in respiratory tract, part unspecified causing other injury, initial encounter: Secondary | ICD-10-CM

## 2019-08-11 MED ORDER — BACLOFEN 5 MG HALF TABLET: 5.0000 mg | ORAL_TABLET | Freq: Three times a day (TID) | ORAL | Status: DC | PRN

## 2019-08-11 MED ORDER — TRAMADOL HCL 50 MG PO TABS: 50.0000 mg | ORAL_TABLET | Freq: Three times a day (TID) | ORAL | Status: DC | PRN

## 2019-08-11 NOTE — Progress Notes (Signed)
Physical Therapy Session Note  Patient Details  Name: Kyle Garcia MRN: [id number] Date of Birth: Nov 06, 1951  Today's Date: 08/11/2019 PT Individual Time: 2863-8177 PT Individual Time Calculation (min): 40 min   Short Term Goals: Week 5:  PT Short Term Goal 1 (Week 5): STG = LTG due to estimated d/c date.  Skilled Therapeutic Interventions/Progress Updates:  Pt received in bed with wife Kyle Garcia) present for session. Pt with ongoing c/o & behaviors demonstrating pain with movement & rest breaks/respoisitoning provided for comfort. Pt found to be incontinent of urine & pt unaware. Kyle Garcia assisted PT with rolling pt in bed and donning clean brief & pants & shirt with therapist providing total cuing for direction. Kyle Garcia) present with loaner TIS w/c for pt to use. Pt requires total assist for supine>sitting EOB and dependent assist slide board to TIS w/c. [name] adjusted headrest & armrests and therapist reviewed management of w/c brakes, tilting feature, and leg rests with Kyle Garcia return demonstrating. Educated her on need to have seat belt donned when pt in TIS w/c, need to sit upright for meals, but slightly tilted when just sitting in chair. Pt left in TIS w/c with seat belt donned & Kyle Garcia present to supervise.   Therapy Documentation Precautions:  Precautions Precautions: Fall Precaution Comments: Dense Lt hemi, pusher Restrictions Weight Bearing Restrictions: No  Therapy/Group: Individual Therapy  [name] 08/11/2019, 11:17 AM

## 2019-08-11 NOTE — Progress Notes (Signed)
Occupational Therapy Session Note  Patient Details  Name: Kyle Garcia MRN: [id number] Date of Birth: [birth year]  Today's Date: 08/11/2019 OT Individual Time: 1300-1330 OT Individual Time Calculation (min): 30 min    Short Term Goals: Week 4:  OT Short Term Goal 1 (Week 4): STG= LTG d/t ELOS  Skilled Therapeutic Interventions/Progress Updates:    Session focused on family education and introduction of hoyer training with pt's wife Kyle Garcia. Reviewed in detail hoyer operation and features. Demonstrated fit and discussed safety considerations for pt's specific pain and hemi side. Kyle Garcia had multiple questions that were answered and was actively participating in session. Pt left sitting up in TIS with Kyle Garcia present, safety belt donned as she stated she would leave soon.   Therapy Documentation Precautions:  Precautions Precautions: Fall Precaution Comments: Dense Lt hemi, pusher Restrictions Weight Bearing Restrictions: No   Therapy/Group: Individual Therapy  [name] 08/11/2019, 12:18 PM

## 2019-08-11 NOTE — Progress Notes (Addendum)
Occupational Therapy Weekly Progress Note  Patient Details  Name: Stephens Shreve MRN: [id number] Date of Birth: 30-Jan-1951  Beginning of progress report period: August 04, 2019 End of progress report period: August 11, 2019  Today's Date: 08/11/2019 OT Individual Time: 1115-1200 OT Individual Time Calculation (min): 45 min    Patient has met 2 of 9 long term goals.  Short term goals not set due to estimated length of stay. Pt is tolerating PROM to the LUE and completing rolling with total A. Pt's family is now present for family education training. Training is focused on hoyer use, bed level ADLs, and hemi techniques for ADLs.   Patient continues to demonstrate the following deficits: muscle weakness and muscle joint tightness, abnormal tone, decreased attention to left, decreased initiation, decreased attention, decreased awareness, decreased problem solving, decreased safety awareness, decreased memory and delayed processing and decreased sitting balance, decreased standing balance, decreased postural control, hemiplegia and decreased balance strategies and therefore will continue to benefit from skilled OT intervention to enhance overall performance with Reduce care partner burden.  See Patient's Care Plan for progression toward long term goals.  Sitting balance goal has been discontinued and dressing/bathing further downgraded to total A.  Continue plan of care.  Skilled Therapeutic Interventions/Progress Updates:    Pt received sitting up in the TIS w/c with wife [name] present for family education. Demonstrated UB ADLs at chair level. Focus placed on pain management, safe LUE management and hemi techniques for dressing. Pt required total A for all UB bathing and dressing at chair level. Pt's wife [name] actively engaged and participating throughout session. Demonstrated PROM technique to pt's LUE. Handouts provided to ensure carryover at home and reviewed. Pt was left sitting up in the TIS w/c  with all needs met, [name] present.   Therapy Documentation Precautions:  Precautions Precautions: Fall Precaution Comments: Dense Lt hemi, pusher Restrictions Weight Bearing Restrictions: No   Therapy/Group: Individual Therapy  [name] 08/11/2019, 7:13 AM

## 2019-08-11 NOTE — Progress Notes (Signed)
Speech Language Pathology Daily Session Note  Patient Details  Name: Kyle Garcia MRN: [id number] Date of Birth: 1951-08-01  Today's Date: 08/11/2019 SLP Individual Time: 0093-8182 SLP Individual Time Calculation (min): 55 min  Short Term Goals: Week 5: SLP Short Term Goal 1 (Week 5): Given Max A cues, pt will consume current diet with minimal overt s/s of aspiration. SLP Short Term Goal 2 (Week 5): Patient will demonstrate sustained attention to functional tasks for 5 minutes with Max A verbal cues for redirection. SLP Short Term Goal 3 (Week 5): Given Max A cues, pt will follow 1 step basic directions in 5 out of 10 opportunities. SLP Short Term Goal 4 (Week 5): Given Max A cues, patient will initiate functional tasks in 5 out of 10 opportunities.  Skilled Therapeutic Interventions: Skilled treatment session focused on family education with the patient's wife. SLP facilitated session by providing education to the patient and his wife in regards to his current cognitive functioning and strategies as well as approrpriate cueing to utilize at home to maximize initiation, attention, orientation and overall safety with functional tasks. SLP also generated a basic list of activities he can do at home to engage with grandchildren, per her request (roll the dice to games, etc). Patient left upright in bed with alarm on and all needs within reach with wife present. Continue with current plan of care.      Pain No/Denies Pain   Therapy/Group: Individual Therapy  Shara Hartis 08/11/2019, 10:19 AM

## 2019-08-11 NOTE — Progress Notes (Signed)
[place] PHYSICAL MEDICINE & REHABILITATION PROGRESS NOTE   Subjective/Complaints:  Lying in bed. Therapy and wife at bedside. Wife in for ed  ROS: limited d/t cognition  Objective:   No results found. No results for input(s): WBC, HGB, HCT, PLT in the last 72 hours. No results for input(s): NA, K, CL, CO2, GLUCOSE, BUN, CREATININE, CALCIUM in the last 72 hours.  Intake/Output Summary (Last 24 hours) at 08/11/2019 1236 Last data filed at 08/10/2019 1857 Gross per 24 hour  Intake 120 ml  Output -  Net 120 ml     Physical Exam: Vital Signs Blood pressure 106/71, pulse (!) 109, temperature 97.8 F (36.6 C), resp. rate 19, height 5\' 11"  (1.803 m), weight 58.8 kg, SpO2 96 %. Constitutional: No distress . Vital signs reviewed. HEENT: EOMI, oral membranes moist Neck: supple Cardiovascular: RRR without murmur. No JVD    Respiratory: CTA Bilaterally without wheezes or rales. Normal effort    GI: BS +, non-tender, non-distended  Musc: Pain with LUE/LLE PROM again Neurologic: fairly alert  Slow processing Motor: RUE/RLE: Grossly 5/5  LUE/LLE: 0/5 Right gaze preference Left neglect Significant tone LUE/LLE-2/4---no change in neuro exam Psych:smiling , alert  No lability    Assessment/Plan: 1. Functional deficits secondary to Right MCA infarct with dysphagia  which require 3+ hours per day of interdisciplinary therapy in a comprehensive inpatient rehab setting.  Physiatrist is providing close team supervision and 24 hour management of active medical problems listed below.  Physiatrist and rehab team continue to assess barriers to discharge/monitor patient progress toward functional and medical goals  Care Tool:  Bathing    Body parts bathed by patient: Face, Right arm, Left arm, Abdomen, Chest, Front perineal area, Buttocks, Right upper leg, Left upper leg, Left lower leg, Right lower leg   Body parts bathed by helper: Right arm, Left arm, Chest, Abdomen     Bathing  assist Assist Level: Total Assistance - Patient < 25%     Upper Body Dressing/Undressing Upper body dressing   What is the patient wearing?: Pull over shirt    Upper body assist Assist Level: Total Assistance - Patient < 25%    Lower Body Dressing/Undressing Lower body dressing      What is the patient wearing?: Pants, Incontinence brief     Lower body assist Assist for lower body dressing: Dependent - Patient 0%     Toileting Toileting    Toileting assist Assist for toileting: Dependent - Patient 0%     Transfers Chair/bed transfer  Transfers assist     Chair/bed transfer assist level: 2 Helpers     Locomotion Ambulation   Ambulation assist   Ambulation activity did not occur: Safety/medical concerns  Assist level: 2 helpers Assistive device: No Device Max distance: 10   Walk 10 feet activity   Assist  Walk 10 feet activity did not occur: Safety/medical concerns  Assist level: 2 helpers Assistive device: No Device   Walk 50 feet activity   Assist Walk 50 feet with 2 turns activity did not occur: Safety/medical concerns         Walk 150 feet activity   Assist Walk 150 feet activity did not occur: Safety/medical concerns         Walk 10 feet on uneven surface  activity   Assist Walk 10 feet on uneven surfaces activity did not occur: Safety/medical concerns         Wheelchair     Assist Will patient use wheelchair at  discharge?: Yes Type of Wheelchair: Manual    Wheelchair assist level: Maximal Assistance - Patient 25 - 49% Max wheelchair distance: 20    Wheelchair 50 feet with 2 turns activity    Assist        Assist Level: Maximal Assistance - Patient 25 - 49%   Wheelchair 150 feet activity     Assist     Assist Level: Dependent - Patient 0%    Medical Problem List and Plan: 1.Dense lefthemiparesis, now with spasticitywith dysphagiasecondary to embolic shower right MCA occlusion status post  reperfusion. Infarct felt to be embolic secondary to hypercoagulable state from lung cancer    Continue CIR PT, OT, SLP 15/7  -home hospice this week, family ed prior---tentative target dc of 8/21  -no feeding tube or IVF   -appreciate palliative care help  -spoke with wife today regarding a few considerations 2. Antithrombotics: -DVT/anticoagulation/history of right lower extremity DVT: Eliquis -antiplatelet therapy: N/A 3. Pain Management/spastic left hemiparesis:will reintroduce PRN tramadol for pain control as stressing comfort right now   -antispasmodics   -will add PRN baclofen for spasms   -Orthosis  -kpad for back 4. Mood:Provide emotional support -antipsychotic agents: N/A  -holding ritalin 5. Neuropsych: This patientis notcapable of making decisions on hisown behalf. 6. Skin/Wound Care:Routine skin checks 7. Fluids/Electrolytes/Nutrition: intake is 25-100% of meals   - BUN/Cr stable on 8/12--no more labs 8.Stage IV adenocarcinoma right lung. Chemotherapy as per [name]  -onc appts on temporary hold until after rehab discharge No plan for further chemo 9. Aspiration pneumonia,resolved 10.  Post stroke dysphagia.   D1 honeys  11. Hyperlipidemia. Crestor 12. Remote tobacco abuse. Counseling 13.Acute on chronic anemia.   Hemoglobin 8.2 on 8/12  Continue to monitor  14 Hypocalcemia-stable at 8.4 on 7/30  -likely related to #8 15.     Tachycardia: has had since admit--110's to 120's- Vitals:   08/10/19 1946 08/11/19 0324  BP: 137/72 106/71  Pulse: 97 (!) 109  Resp: 18 19  Temp: 98.3 F (36.8 C) 97.8 F (36.6 C)  SpO2: 100% 96%     LOS: 32 days A FACE TO FACE EVALUATION WAS PERFORMED  [name] 08/11/2019, 12:36 PM

## 2019-08-11 NOTE — Progress Notes (Signed)
Physical Therapy Session Note  Patient Details  Name: Kyle Garcia MRN: [id number] Date of Birth: 04/04/1951  Today's Date: 08/11/2019 PT Individual Time: 1500-1525 PT Individual Time Calculation (min): 25 min   Short Term Goals: Week 5:  PT Short Term Goal 1 (Week 5): STG = LTG due to estimated d/c date.  Skilled Therapeutic Interventions/Progress Updates:    Pt received reclined in TIS w/c in room. Pt agreeable to PT session. No complaints of pain initially but does indicate pain in LLE with PROM via grimacing and groaning. Attempt to perform seated L UE/LE PROM in available planes of motion to prevent contracture. Pt exhibits poor tolerance to LUE PROM and fair tolerance to LLE PROM. Pt frequently reaching out with RUE, attempt to distract pt and have him hold onto foam block with RUE during session. Pt unable to attend to maintaining grasp on foam. Pt left reclined in TIS w/c in room with call button in reach, quick release belt and chair alarm in place.  Therapy Documentation Precautions:  Precautions Precautions: Fall Precaution Comments: Dense Lt hemi, pusher Restrictions Weight Bearing Restrictions: No    Therapy/Group: Individual Therapy   Excell Seltzer, PT, DPT  08/11/2019, 4:09 PM

## 2019-08-12 ENCOUNTER — Encounter (HOSPITAL_COMMUNITY): Payer: Medicare Other

## 2019-08-12 ENCOUNTER — Other Ambulatory Visit: Payer: Medicare Other

## 2019-08-12 ENCOUNTER — Encounter (HOSPITAL_COMMUNITY): Payer: Medicare Other | Admitting: Speech Pathology

## 2019-08-12 ENCOUNTER — Ambulatory Visit (HOSPITAL_COMMUNITY): Payer: Medicare Other | Admitting: Physical Therapy

## 2019-08-12 NOTE — Progress Notes (Signed)
Speech Language Pathology Daily Session Note  Patient Details  Name: Kyle Garcia MRN: [id number] Date of Birth: 17-May-1951  Today's Date: 08/12/2019 SLP Individual Time: 6333-5456 SLP Individual Time Calculation (min): 30 min  Short Term Goals: Week 5: SLP Short Term Goal 1 (Week 5): Given Max A cues, pt will consume current diet with minimal overt s/s of aspiration. SLP Short Term Goal 2 (Week 5): Patient will demonstrate sustained attention to functional tasks for 5 minutes with Max A verbal cues for redirection. SLP Short Term Goal 3 (Week 5): Given Max A cues, pt will follow 1 step basic directions in 5 out of 10 opportunities. SLP Short Term Goal 4 (Week 5): Given Max A cues, patient will initiate functional tasks in 5 out of 10 opportunities.  Skilled Therapeutic Interventions: Skilled treatment session focused on family education with the patient's wife and daughter. SLP facilitated session by providing ongoing questions in regards to patient's current swallowing function and appropriate diet textures. All questions were answered and patient's wife thickened liquids appropriately with supervision level verbal cues. Patient lethargic throughout session with language of confusion requiring Max verbal cues for arousal. Patient left upright upright in wheelchair with family present. Continue with current plan of care.      Pain No/Denies Pain   Therapy/Group: Individual Therapy  Romelle Muldoon 08/12/2019, 2:37 PM

## 2019-08-12 NOTE — Progress Notes (Signed)
Manufacturing engineer [facility]) Hospice  Referral received from [place] for hospice services at home once discharged.  Patient and chart reviewed by [facility] MD and he is eligible for hospice services.  Spoke with wife [name], continued our discussion from several days ago.  DME discussed, he will need a hospital bed and hoyer lift.  [facility] will order these from [facility].  He had a tilt and space wheelchair donated by a local DME company, it was delivered to the unit and the family will transport this home.  Plan is to d/c home Friday via [place].  [facility] will schedule a RN visit tentatively Friday at 6pm.  This plan will be confirmed with the family.  Once complete, please fax the d/c summary to:  [phone]  Information given to family, including volunteer services at [facility]--which are currently on hold due to the [place] 19 pandemic.  Thank you, Venia Carbon RN, BSN, [facility] Liaison (in [place]) [phone]

## 2019-08-12 NOTE — Progress Notes (Signed)
[place] PHYSICAL MEDICINE & REHABILITATION PROGRESS NOTE   Subjective/Complaints:  No new issues overnight.   ROS: Limited due to cognitive/behavioral    Objective:   No results found. No results for input(s): WBC, HGB, HCT, PLT in the last 72 hours. No results for input(s): NA, K, CL, CO2, GLUCOSE, BUN, CREATININE, CALCIUM in the last 72 hours.  Intake/Output Summary (Last 24 hours) at 08/12/2019 0953 Last data filed at 08/11/2019 2340 Gross per 24 hour  Intake 300 ml  Output -  Net 300 ml     Physical Exam: Vital Signs Blood pressure 114/74, pulse (!) 101, temperature 98.3 F (36.8 C), temperature source Oral, resp. rate 16, height 5\' 11"  (1.803 m), weight 59 kg, SpO2 98 %. Constitutional: No distress . Vital signs reviewed. HEENT: EOMI, oral membranes moist Neck: supple Cardiovascular: RRR without murmur. No JVD    Respiratory: CTA Bilaterally without wheezes or rales. Normal effort    GI: BS +, non-tender, non-distended  Musc: Pain with LUE/LLE PROM again Neurologic: fairly alert  Slow processing Motor: RUE/RLE: Grossly 5/5  LUE/LLE: 0/5 Right gaze preference Left neglect Significant tone LUE/LLE-2/4---no change Psych:smiling , alert  No lability    Assessment/Plan: 1. Functional deficits secondary to Right MCA infarct with dysphagia  which require 3+ hours per day of interdisciplinary therapy in a comprehensive inpatient rehab setting.  Physiatrist is providing close team supervision and 24 hour management of active medical problems listed below.  Physiatrist and rehab team continue to assess barriers to discharge/monitor patient progress toward functional and medical goals  Care Tool:  Bathing    Body parts bathed by patient: Face, Right arm, Left arm, Abdomen, Chest, Front perineal area, Buttocks, Right upper leg, Left upper leg, Left lower leg, Right lower leg   Body parts bathed by helper: Right arm, Left arm, Chest, Abdomen     Bathing assist  Assist Level: Total Assistance - Patient < 25%     Upper Body Dressing/Undressing Upper body dressing   What is the patient wearing?: Pull over shirt    Upper body assist Assist Level: Total Assistance - Patient < 25%    Lower Body Dressing/Undressing Lower body dressing      What is the patient wearing?: Pants, Incontinence brief     Lower body assist Assist for lower body dressing: Dependent - Patient 0%     Toileting Toileting    Toileting assist Assist for toileting: Dependent - Patient 0%     Transfers Chair/bed transfer  Transfers assist     Chair/bed transfer assist level: 2 Helpers     Locomotion Ambulation   Ambulation assist   Ambulation activity did not occur: Safety/medical concerns  Assist level: 2 helpers Assistive device: No Device Max distance: 10   Walk 10 feet activity   Assist  Walk 10 feet activity did not occur: Safety/medical concerns  Assist level: 2 helpers Assistive device: No Device   Walk 50 feet activity   Assist Walk 50 feet with 2 turns activity did not occur: Safety/medical concerns         Walk 150 feet activity   Assist Walk 150 feet activity did not occur: Safety/medical concerns         Walk 10 feet on uneven surface  activity   Assist Walk 10 feet on uneven surfaces activity did not occur: Safety/medical concerns         Wheelchair     Assist Will patient use wheelchair at discharge?: Yes Type of  Wheelchair: Agricultural engineer assist level: Maximal Assistance - Patient 25 - 49% Max wheelchair distance: 20    Wheelchair 50 feet with 2 turns activity    Assist        Assist Level: Maximal Assistance - Patient 25 - 49%   Wheelchair 150 feet activity     Assist     Assist Level: Dependent - Patient 0%    Medical Problem List and Plan: 1.Dense lefthemiparesis, now with spasticitywith dysphagiasecondary to embolic shower right MCA occlusion status post  reperfusion. Infarct felt to be embolic secondary to hypercoagulable state from lung cancer    Continue CIR PT, OT, SLP 15/7 as tolerated  -home hospice this week, family ed prior---tentative target dc of 8/21  -no feeding tube or IVF   -appreciate palliative care help  -spoke with wife today regarding a few considerations 2. Antithrombotics: -DVT/anticoagulation/history of right lower extremity DVT: Eliquis -antiplatelet therapy: N/A 3. Pain Management/spastic left hemiparesis:will reintroduce PRN tramadol for pain control as stressing comfort right now   -antispasmodics   -added back PRN baclofen for spasms   -Orthosis  -kpad for back 4. Mood:Provide emotional support -antipsychotic agents: N/A  -holding ritalin 5. Neuropsych: This patientis notcapable of making decisions on hisown behalf. 6. Skin/Wound Care:Routine skin checks 7. Fluids/Electrolytes/Nutrition: intake is 25-100% of meals   - BUN/Cr stable on 8/12--no more labs 8.Stage IV adenocarcinoma right lung. Chemotherapy as per [name]  -onc appts on temporary hold until after rehab discharge No plan for further chemo 9. Aspiration pneumonia,resolved 10.  Post stroke dysphagia.   D1 honeys  11. Hyperlipidemia. Crestor 12. Remote tobacco abuse. Counseling 13.Acute on chronic anemia.   Hemoglobin 8.2 on 8/12  Continue to monitor  14 Hypocalcemia-stable at 8.4 on 7/30  -likely related to #8 15.     Tachycardia: has had since admit--110's to 120's- Vitals:   08/11/19 1947 08/12/19 0317  BP: 109/70 114/74  Pulse: 100 (!) 101  Resp: 16 16  Temp: 98.4 F (36.9 C) 98.3 F (36.8 C)  SpO2: 99% 98%     LOS: 33 days A FACE TO FACE EVALUATION WAS PERFORMED  [name] 08/12/2019, 9:53 AM

## 2019-08-12 NOTE — Patient Care Conference (Signed)
Inpatient RehabilitationTeam Conference and Plan of Care Update Date: 08/11/2019   Time: 10:28 AM    Patient Name: Kyle Garcia      Medical Record Number: [id number]  Date of Birth: 05-07-1951 Sex: Male         Room/Bed: 4W20C/4W20C-01 Payor Info: Payor: MEDICARE / Plan: MEDICARE PART A AND B / Product Type: *No Product type* /    Admitting Diagnosis: 1. TBI Team  RT. CVA; 22-24days  Admit Date/Time:  07/10/2019  4:34 PM Admission Comments: No comment available   Primary Diagnosis:  <principal problem not specified> Principal Problem: <principal problem not specified>  Patient Active Problem List   Diagnosis Date Noted  . Aspiration into airway   . Confusion   . DNR (do not resuscitate)   . Palliative care encounter   . Sinus tachycardia   . Hypocalcemia   . Spastic hemiparesis ([place])   . SIRS (systemic inflammatory response syndrome) ([facility])   . Acute on chronic anemia   . Hx of ischemic multifocal multiple vascular territories stroke 07/10/2019  . Dysphagia due to recent cerebrovascular accident 07/10/2019  . Fever 07/10/2019  . Right middle cerebral artery stroke ([place]) 07/10/2019  . Acute right arterial ischemic stroke, middle cerebral artery (MCA) ([place]) s/p mechanical thrombectomy 07/03/2019  . Middle cerebral artery embolism, right 07/03/2019  . CAP (community acquired pneumonia) 06/20/2019  . Stroke ([place])   . Stroke due to embolism ([place]) 06/17/2019  . Encounter for antineoplastic chemotherapy 06/05/2019  . Encounter for antineoplastic immunotherapy 06/05/2019  . Goals of care, counseling/discussion 06/05/2019  . Adenocarcinoma of right lung, stage 4 ([place]) 06/04/2019  . Cough 05/21/2019  . S/P bronchoscopy with biopsy   . DVT (deep venous thrombosis) ([place]) 05/08/2019  . Weight loss 04/28/2019  . Aspiration pneumonia ([place]) 04/10/2019  . GERD without esophagitis 11/14/2015  . Hyperlipidemia 11/14/2015  . Benign prostatic hyperplasia 12/24/2008    Expected Discharge  Date: Expected Discharge Date: 08/14/19  Team Members Present: Physician leading conference: Dr. Alger Simons Social Worker Present: Lennart Pall, LCSW Nurse Present: Dorthula Nettles, RN PT Present: Lavone Nian, PT OT Present: Laverle Hobby, OT SLP Present: Weston Anna, SLP PPS Coordinator present : Gunnar Fusi, SLP     Current Status/Progress Goal Weekly Team Focus  Medical   right MCA infarct, metastatic lung cancer. palliative care/hospice, family ed today  prepare pt/family for discharge home  see above   Bowel/Bladder   incontinent of bowel/bladder; LBM 08/10/19  patient to be able to have regular bowel/bladder pattern with mod assist  Assist with bowel/bladder needs PRN   Swallow/Nutrition/ Hydration   Dys. 1 textures with honey-thick liquids via tsp, Mod A  Max A  Family Education   ADL's   Total-max A ADLs, family education initiated, poor attention and processing  max A UB dressing, tolerating PROM to LUE, tolerate rolling with total A  Family training, d/c planning, LUE/LE PROM, increasing quality of life   Mobility   total +1-2 assist overall, impaired cognition/awareness/attention, visual spatial deficits  downgraded to max assist sitting balance, max assist bed mobility, max assist bed<>chair  focus on d/c planning with family participating in caregiver training for bed mobility & manual hoyer transfers, positioning & bed mobility as family now wishes to take pt home with hospice   Communication             Safety/Cognition/ Behavioral Observations  Max-Total A  Max A  Family Education   Pain   sings and symptoms of pain  noted, Tylenol PRN given  <2  Assess and treat pain q shift andas needed   Skin   MASD to the groin; barrier cream  Maintain skin integrity with min assist  Assess skin q shift and as needed      *See Care Plan and progress notes for long and short-term goals.     Barriers to Discharge  Current Status/Progress Possible Resolutions Date  Resolved   Physician    Other (comments)  end of life considerations     family ed, hospice follow up      Nursing                  PT  Inaccessible home environment;Nutrition means;Pending chemo/radiation;Incontinence;Decreased caregiver support                 OT                  SLP                SW                Discharge Planning/Teaching Needs:  Plan for d/c home with wife and daughter.  [facility] referral placed (formerly [facility]) and they will follow at d/c.  Wife is also arranging private duty agency support.  Teaching has been daily this week.   Team Discussion:  No new medical issues this week overall.  IVF stopped.  Max - total assist with ADLs and mobility.  Family education underway with plan to d/c end of week with [facility] following ([place])  Revisions to Treatment Plan:  Focus changed to family ed and plan to home with hospice.    Continued Need for Acute Rehabilitation Level of Care: The patient requires daily medical management by a physician with specialized training in physical medicine and rehabilitation for the following conditions: Daily direction of a multidisciplinary physical rehabilitation program to ensure safe treatment while eliciting the highest outcome that is of practical value to the patient.: Yes Daily medical management of patient stability for increased activity during participation in an intensive rehabilitation regime.: Yes Daily analysis of laboratory values and/or radiology reports with any subsequent need for medication adjustment of medical intervention for : Neurological problems;Mood/behavior problems;Other   I attest that I was present, lead the team conference, and concur with the assessment and plan of the team.   Lennart Pall 08/12/2019, 3:46 PM    Team conference was held via web/ teleconference due to [place] - 19

## 2019-08-12 NOTE — Discharge Summary (Signed)
Physician Discharge Summary  Patient ID: Kyle Garcia MRN: [id number] DOB/AGE: 67/07/1951 67 y.o.  Admit date: 07/10/2019 Discharge date: 08/14/2019  Discharge Diagnoses:  Active Problems:   Right middle cerebral artery stroke (HCC)   Hypocalcemia   Spastic hemiparesis (HCC)   SIRS (systemic inflammatory response syndrome) (HCC)   Acute on chronic anemia   Sinus tachycardia   Confusion   DNR (do not resuscitate)   Palliative care encounter   Aspiration into airway DVT prophylaxis Stage IV adenocarcinoma right lung Dysphagia Remote tobacco abuse  Discharged Condition: Guarded  Significant Diagnostic Studies: Dg Chest 2 View  Result Date: 07/25/2019 CLINICAL DATA:  Cough and confusion. EXAM: CHEST - 2 VIEW COMPARISON:  July 09, 2019 FINDINGS: The mediastinal contour and cardiac silhouette are normal. Right mid and lower lung pneumonia is unchanged. The left lung is clear. There is no pleural effusion. The bony structures are stable. IMPRESSION: Right mid and lower lung pneumonia is unchanged. Electronically Signed   By: Abelardo Diesel M.D.   On: 07/25/2019 10:55   Ct Head Wo Contrast  Result Date: 07/29/2019 CLINICAL DATA:  Altered mental status. Recent stroke with thrombectomy 07/03/2019 EXAM: CT HEAD WITHOUT CONTRAST TECHNIQUE: Contiguous axial images were obtained from the base of the skull through the vertex without intravenous contrast. COMPARISON:  MRI head 07/03/2019 FINDINGS: Brain: Hypodensity in the right posterior temporal and parietal lobe compatible with subacute infarct. The exact date of the infarct cannot be accurately determined however this infarct has progressed significantly since the MRI of 07/03/2019. No associated hemorrhage Generalized atrophy. Subacute infarct in the right anterior cerebral artery territory which was present on the prior MRI. Retro cerebellar CSF cyst unchanged. Vascular: Negative for hyperdense vessel Skull: Negative Sinuses/Orbits: Negative  Other: None IMPRESSION: Moderately large right MCA infarct appears subacute and has developed since the prior MRI of 07/03/2019. Subacute infarct also in the right anterior cerebral artery territory which was identified on the prior MRI. Negative for acute hemorrhage.  No midline shift. Generalized atrophy.  Retro cerebellar cyst unchanged. Electronically Signed   By: Franchot Gallo M.D.   On: 07/29/2019 18:22   Dg Chest Port 1 View  Result Date: 07/29/2019 CLINICAL DATA:  Confused, altered mental status, concern for aspiration EXAM: PORTABLE CHEST 1 VIEW COMPARISON:  07/25/2019 FINDINGS: No significant change in heterogeneous and consolidative opacity of the right midlung. The left lung is normally aerated. No new airspace opacity. IMPRESSION: No significant change in heterogeneous and consolidative opacity of the right midlung. The left lung is normally aerated. No new airspace opacity. Electronically Signed   By: Eddie Candle M.D.   On: 07/29/2019 15:23   Dg Swallowing Func-speech Pathology  Result Date: 07/30/2019 Objective Swallowing Evaluation: Type of Study: MBS-Modified Barium Swallow Study  Patient Details Name: Kyle Garcia MRN: [id number] Date of Birth: [birth year] Today's Date: 07/30/2019 Time: SLP Start Time (ACUTE ONLY): 0954 -SLP Stop Time (ACUTE ONLY): 1016 SLP Time Calculation (min) (ACUTE ONLY): 22 min Past Medical History: Past Medical History: Diagnosis Date . Cancer ([place])   stage 4 adenocarcinoma right lung . GERD (gastroesophageal reflux disease)  . Hyperlipidemia  . Stroke [facility])   25-30 emboli seen on screening MRI Past Surgical History: Past Surgical History: Procedure Laterality Date . CATARACT EXTRACTION  2016 . EYE SURGERY   . IR CT HEAD LTD  07/03/2019 . IR PERCUTANEOUS ART THROMBECTOMY/INFUSION INTRACRANIAL INC DIAG ANGIO  07/03/2019 . RADIOLOGY WITH ANESTHESIA N/A 07/03/2019  Procedure: IR WITH ANESTHESIA;  Surgeon: Luanne Bras, MD;  Location: [facility];  Service: Radiology;   Laterality: N/A; . VIDEO BRONCHOSCOPY Bilateral 05/21/2019  Procedure: VIDEO BRONCHOSCOPY WITH FLUORO;  Surgeon: Collene Gobble, MD;  Location: [facility] ENDOSCOPY;  Service: Cardiopulmonary;  Laterality: Bilateral; HPI: 68yo M with history of R DVT on Xarelto, adenocarcinoma of the R lung, prior strokes on imaging, HLD, and GERD, who p/w left-sided hemiplegia and R gaze deviation. Found to have R MCA and ACA occlusions. Now s/p mechanical thrombectomy with VIR on 7/10. MBS 7/12 recommended nectar with chin tuck. Pt has exhibited clinical signs of aspiration with nectar thick using chin tuck. Repeat MBS warranted.    Subjective: Pt awake, alert, pleasant, participative Assessment / Plan / Recommendation CHL IP CLINICAL IMPRESSIONS 07/30/2019 Clinical Impression Pt presents with severe oropharyngeal dysphagia that is further compromised by pt's cognitive deficits and physical inability to achieve optimal positioning d/t . Pt's oral phase is c/b decreased lingual manipulation of bolus, ineffective AP transfer/propulsion of bolus which results in increased mastication and premature spillage of boluses. Additionally, pt presents with severe pharyngeal phase deficits related to timing of swallow. Pt presents with extended containment of bolus in vallecula prior to initiating swallow. As a result, any portion of the bolus that escapes the vallecula are aspirated prior to swallow. While pt senses aspirate, his cough is not protective or productive in expelling the aspirate. ASPIRATION IS DEPENDENT ON BOLUS SIZE. Honey thick liquids and nectar thick liquids by spoon were consistently consumed without aspiration. Thin liquids via spoon were occasionally aspirated. When consuming food texture and nectar thick liquid via spoon, pt with aspiration as he didn't swallow food texture prior to consuming liquid which dumped into his airway. Based on this study, pt is appropriate for dypshagia 2 with nectar thick liquids by spoon, however  during this study, pt ONLY coughed during moments of aspiration. Therefore, if at bedside, pt is coughing with above mentioned diet, would recommend downgrading diet to conservative diet of puree with honey thick liquids via spoon. SLP Visit Diagnosis Dysphagia, oropharyngeal phase (R13.12);Cognitive communication deficit (R41.841) Attention and concentration deficit following -- Frontal lobe and executive function deficit following -- Impact on safety and function Severe aspiration risk;Risk for inadequate nutrition/hydration   CHL IP TREATMENT RECOMMENDATION 07/30/2019 Treatment Recommendations Therapy as outlined in treatment plan below   Prognosis 07/08/2019 Prognosis for Safe Diet Advancement Good Barriers to Reach Goals Cognitive deficits;Severity of deficits Barriers/Prognosis Comment -- CHL IP DIET RECOMMENDATION 07/30/2019 SLP Diet Recommendations Dysphagia 2 (Fine chop) solids;Nectar thick liquid Liquid Administration via Spoon Medication Administration Whole meds with puree Compensations Small sips/bites;Slow rate;Minimize environmental distractions;Lingual sweep for clearance of pocketing Postural Changes Seated upright at 90 degrees   CHL IP OTHER RECOMMENDATIONS 07/30/2019 Recommended Consults -- Oral Care Recommendations Oral care QID Other Recommendations --   CHL IP FOLLOW UP RECOMMENDATIONS 07/30/2019 Follow up Recommendations Inpatient Rehab   CHL IP FREQUENCY AND DURATION 07/08/2019 Speech Therapy Frequency (ACUTE ONLY) min 2x/week Treatment Duration 2 weeks      CHL IP ORAL PHASE 07/30/2019 Oral Phase Impaired Oral - Pudding Teaspoon -- Oral - Pudding Cup -- Oral - Honey Teaspoon Weak lingual manipulation;Lingual pumping;Reduced posterior propulsion;Holding of bolus;Premature spillage;Decreased bolus cohesion;Delayed oral transit Oral - Honey Cup NT Oral - Nectar Teaspoon Weak lingual manipulation;Lingual pumping;Reduced posterior propulsion;Holding of bolus;Premature spillage;Decreased bolus  cohesion;Delayed oral transit Oral - Nectar Cup NT Oral - Nectar Straw Lingual pumping;Reduced posterior propulsion;Holding of bolus;Weak lingual manipulation;Premature spillage;Decreased bolus cohesion;Delayed oral transit Oral - Thin Teaspoon Weak lingual manipulation;Lingual  pumping;Reduced posterior propulsion;Holding of bolus;Delayed oral transit;Decreased bolus cohesion;Premature spillage Oral - Thin Cup NT Oral - Thin Straw NT Oral - Puree Lingual pumping;Reduced posterior propulsion;Holding of bolus;Weak lingual manipulation;Premature spillage;Decreased bolus cohesion;Delayed oral transit Oral - Mech Soft Weak lingual manipulation;Lingual pumping;Reduced posterior propulsion;Holding of bolus;Delayed oral transit;Decreased bolus cohesion;Premature spillage Oral - Regular NT Oral - Multi-Consistency -- Oral - Pill -- Oral Phase - Comment --  CHL IP PHARYNGEAL PHASE 07/30/2019 Pharyngeal Phase Impaired Pharyngeal- Pudding Teaspoon -- Pharyngeal -- Pharyngeal- Pudding Cup -- Pharyngeal -- Pharyngeal- Honey Teaspoon Delayed swallow initiation-vallecula Pharyngeal -- Pharyngeal- Honey Cup NT Pharyngeal -- Pharyngeal- Nectar Teaspoon Delayed swallow initiation-vallecula Pharyngeal -- Pharyngeal- Nectar Cup Delayed swallow initiation-vallecula;Reduced airway/laryngeal closure;Penetration/Aspiration before swallow;Moderate aspiration Pharyngeal Material enters airway, passes BELOW cords and not ejected out despite cough attempt by patient Pharyngeal- Nectar Straw Delayed swallow initiation-vallecula;Reduced airway/laryngeal closure;Penetration/Aspiration before swallow;Moderate aspiration Pharyngeal Material enters airway, passes BELOW cords and not ejected out despite cough attempt by patient Pharyngeal- Thin Teaspoon Delayed swallow initiation-vallecula;Reduced airway/laryngeal closure;Penetration/Aspiration before swallow;Moderate aspiration Pharyngeal Material enters airway, passes BELOW cords and not ejected out  despite cough attempt by patient Pharyngeal- Thin Cup NT Pharyngeal -- Pharyngeal- Thin Straw NT Pharyngeal -- Pharyngeal- Puree Delayed swallow initiation-vallecula Pharyngeal -- Pharyngeal- Mechanical Soft Delayed swallow initiation-vallecula Pharyngeal -- Pharyngeal- Regular NT Pharyngeal -- Pharyngeal- Multi-consistency -- Pharyngeal -- Pharyngeal- Pill -- Pharyngeal -- Pharyngeal Comment --  CHL IP CERVICAL ESOPHAGEAL PHASE 07/30/2019 Cervical Esophageal Phase WFL Pudding Teaspoon -- Pudding Cup -- Honey Teaspoon -- Honey Cup -- Nectar Teaspoon -- Nectar Cup -- Nectar Straw -- Thin Teaspoon -- Thin Cup -- Thin Straw -- Puree -- Mechanical Soft -- Regular -- Multi-consistency -- Pill -- Cervical Esophageal Comment -- Happi Overton 07/30/2019, 12:43 PM               Labs:  Basic Metabolic Panel: No results for input(s): NA, K, CL, CO2, GLUCOSE, BUN, CREATININE, CALCIUM, MG, PHOS in the last 168 hours.  CBC: No results for input(s): WBC, NEUTROABS, HGB, HCT, MCV, PLT in the last 168 hours.  CBG: No results for input(s): GLUCAP in the last 168 hours.  Family history.  Mother and father with hypertension as well as hyperlipidemia.  Denies any pancreatic cancer or colon cancer  Brief HPI:   Ramelo Oetken is a 68 year old right-handed male with history of recent right lower extremity DVT confirmed by Doppler 04/15/2019 maintained on Xarelto as well as multiple embolic cortical and subcortical bilateral infarcts identified on MRI from most recent admission June 2020 during work-up of DVT, chronic anemia, stage IV adenocarcinoma of the right lung followed by [name] started on chemotherapy 06/17/2019, hyperlipidemia, patient quit smoking 4 months ago.  Per chart review lives with spouse 1 level home independent retired.  Presented 07/02/2013 with left-sided weakness, right gaze deviation.  Cranial CT and CTA positive for emergent large vessel occlusion with proximal right M2-M3 occlusion.  No TPA as  patient was on Xarelto.  Patient underwent cerebral angiogram followed by revascularization of occluded MCA dominant mid division with x2 passes also partial revascularization of occluded ACA A2-A3 region per interventional radiology.  Patient did require ventilatory support for a short time.  Follow-up MRI after revascularization showed multiple areas of acute infarction right parietal region and numerous punctate acute infarcts in both parietal regions left more than right.  Recent echocardiogram with ejection fraction of 62% normal systolic function.  No source of embolus.  Neurology follow-up patient initially on intravenous heparin transition to  Eliquis.  No plan for TEE advised to continue Eliquis.  Patient with low-grade fever 07/07/2019 chest x-ray completed showing persistent infiltrate right lung placed on Unasyn.  Urine study negative.  Presently was on a dysphagia #2 honey thick liquid diet and patient was admitted for a comprehensive rehab program  Hospital Course: Zen Cedillos was admitted to rehab 07/10/2019 for inpatient therapies to consist of PT, ST and OT at least three hours five days a week. Past admission physiatrist, therapy team and rehab RN have worked together to provide customized collaborative inpatient rehab.  Pertaining to patient embolic shower right MCA occlusion status post reperfusion infarct felt to be embolic secondary to hypercoagulable state from lung cancer.  Patient with noted decline during his rehabilitation stay.  He had initially been maintained on Eliquis with noted history of right lower extremity DVT.  With noted history of stage IV adenocarcinoma right lung followed by [name] chemotherapy have been placed on hold.  Palliative care was consulted during his rehabilitation stay hospice recommended.  No plan for feeding tube or IV fluids as discussed with family.  Pain managed with the use of Crestor.  He remained on a dysphagia #1 honey thick liquid diet.  His  latest chemistries showed a sodium 136, potassium 3.5, BUN 16, creatinine 0.67.  Hemoglobin 8.2 hematocrit 26.1, WBC 10,100.  With the assistance of social work hospice care family was agreeable with discharge date of 08/14/2019.  Wife confirmed they agreed with plan to DC home with [facility](hospice) following.  Family education was ongoing family also securing private duty caregivers via [employer].  Ambulance service contacted for patient's discharge to home   Physical exam.  Blood pressure 112/66 pulse 98 temperature 98.7 respirations 18 oxygen saturations 97% HEENT.  EOMs intact oral membranes moist Neck was supple nontender no JVD Cardiac regular rate rhythm not murmur noted extra sounds Respiratory Limited inspiratory effort without wheeze GI.  Positive bowel sounds nontender Neurological fairly alert slow processing left upper left lower extremity 0 out of 5.  Right gaze preference with left neglect.  Significant tone left upper and left lower extremity.  Rehab course: During patient's stay in rehab weekly team conferences were held to monitor patient's progress, set goals and discuss barriers to discharge. At admission, patient required +2 physical assist squat pivot transfers, +2 physical assist lateral scoot transfers.  Moderate assist upper body bathing total assist lower body bathing max assist upper body dressing total assist lower body dressing  He  has had improvement in activity tolerance, balance, postural control as well as ability to compensate for deficits. He has had improvement in functional use RUE/LUE  and RLE/LLE as well as improvement in awareness.  Sessions focused on family teaching patient's family participated in performing oral hygiene with suction rolling patient in bed to doff soiled and don clean briefs, don pants and shirt bed level, place manual Hoyer lift.       Disposition: Discharge disposition: 01-Home or Self Care     Discharge to home   Diet:  Dysphasia #1 honey liquids  Special Instructions: Hospice care arranged   Medications at discharge addressed as per hospice care.  At time of discharge patient maintained on 1.  Tylenol as needed 2.  Eliquis 5 mg twice daily 3.  Baclofen 5 mg every 8 hours as needed muscle spasms 4.  Calcium vitamin D 2 tablets twice daily 5.  Folic acid 1 mg daily 6.  Crestor 20 mg daily 7.  Tramadol 50  mg every 8 hours as needed severe pain  Discharge Instructions    Ambulatory referral to Neurology   Complete by: As directed    An appointment is requested in approximately 4 to 6 weeks right MCA infarction      Follow-up Information    [name], MD Follow up.   Specialty: Physical Medicine and Rehabilitation Why: No plan for follow up.Hospice has been planned Contact information: [address] [place] [address] [phone]        Luanne Bras, MD Follow up.   Specialties: Interventional Radiology, Radiology Why: Call for appointment Contact information: [place] [address] [phone]        Florentina Jenny L, PA-C Follow up.   Specialty: Physician Assistant Why: As directed Contact information: [address] [place] [address] [phone]           Signed: Cathlyn Parsons 08/14/2019, 4:55 AM

## 2019-08-12 NOTE — Progress Notes (Addendum)
Physical Therapy Discharge Summary  Patient Details  Name: Kyle Garcia MRN: [id number] Date of Birth: 1951-10-07  Today's Date: 08/14/2019   Patient has met 0 of 3 long term goals 2/2 significant deficits noted below. Patient to discharge at a total assist +2/dependent level of care. Pt has made minimal progress towards LTG's as he is limited by significant cognitive deficits, visual spatial deficits, impaired neuromuscular control, impaired midline orientation, impaired balance & awareness, & impaired head/trunk control. Pt is also limited by pain with mobility.   Recommendation:  Patient's family has elected for pt to d/c home with hospice care & will not receive any additional PT services.   Equipment: Recommended:TIS w/c, hospital bed, manual hoyer lift  Reasons for discharge: lack of progress toward goals and discharge from hospital  Patient/family agrees with progress made and goals achieved: Yes  PT Discharge Precautions/Restrictions Precautions Precautions: Fall Precaution Comments: Dense Lt hemi, pusher, visual spatial deficits Restrictions Weight Bearing Restrictions: No  Vision/Perception  Pt with significant visual spatial deficits. Pt with R gaze preference but some improvement in tracking midline/L of midline. Pt with difficulty focusing on & tracking object.  Cognition Overall Cognitive Status: Impaired/Different from baseline Orientation Level: Oriented to person(inconsistently oriented to place, situation) Attention: Sustained Sustained Attention: Impaired Sustained Attention Impairment: Functional basic;Verbal basic Memory: Impaired Memory Impairment: Decreased recall of new information;Storage deficit;Decreased short term memory;Decreased long term memory Awareness Impairment: Intellectual impairment Problem Solving: Impaired Problem Solving Impairment: Verbal basic;Functional basic Safety/Judgment: Impaired  Sensation Sensation Light Touch: Impaired  by gross assessment Proprioception: Impaired by gross assessment Coordination Gross Motor Movements are Fluid and Coordinated: No Fine Motor Movements are Fluid and Coordinated: No  Motor  Motor Motor: Hemiplegia;Abnormal tone;Abnormal postural alignment and control Motor - Skilled Clinical Observations: L hemiparesis, hypertonia and decreased head/trunk control Motor - Discharge Observations: L hemiparesis, hypertonia and decreased head/trunk control   Mobility Pt requires total assist +2/dependent assist for rolling L<>R & supine<>sit.  Trunk/Postural Assessment  Cervical Assessment Cervical Assessment: Exceptions to WFL(cervical flexion, pt unable to hold head upright, forward head) Thoracic Assessment Thoracic Assessment: Exceptions to WFL(rounded shoulders) Lumbar Assessment Lumbar Assessment: Exceptions to WFL(posterior pelvic tilt) Postural Control Postural Control: Deficits on evaluation Head Control: impaired, inability to maintain cervical extension in sitting against gravity Trunk Control: impaired, unable to maintain midline orientation Righting Reactions: impaired Protective Responses: impaired  Balance Pt requires max<>total assist for static sitting balance EOB with BLE supported with pt demonstrating LOB in all directions with forward flexion at times due to suspected spasms.  Extremity Assessment  Pt with significant joint tightness with LUE maintained in elbow & wrist flexion with c/o pain with PROM extension.  Pt with no active movement noted in LLE.  Pt with active movement in RLE & RUE.     [name] 08/14/2019, 7:50 AM

## 2019-08-12 NOTE — Progress Notes (Signed)
Physical Therapy Session Note  Patient Details  Name: Kyle Garcia MRN: [id number] Date of Birth: 1951-09-18  Today's Date: 08/12/2019 PT Individual Time: 1304-1400 PT Individual Time Calculation (min): 56 min   Short Term Goals: Week 5:  PT Short Term Goal 1 (Week 5): STG = LTG due to estimated d/c date.  Skilled Therapeutic Interventions/Progress Updates:  Pt received in bed with NT assisting him with lunch, no family present for training. Therapist began providing assistance for lunch with pt requiring dependent assist for feeding with focus on scanning eyes to locate utensil/food but pt unable. Pt's wife [name]) & daughter [name]) then arrive for caregiver training. Pt's family participated in the following: performing oral hygiene with suction, rolling pt in bed to doff soiled & don clean brief, don pants & shirt bed level, place manual hoyer U sling under pt, & transfer pt bed>TIS w/c with manual hoyer lift. Pt's family requires total step by step instruction for technique and sequencing for all tasks and will benefit from continued practice with all tasks. Pt left in TIS w/c with seat belt donned, family present, and in handoff to SLP.  Pain: pt with gramacing & moaning/groaning c/o pain (no specific location) with all movement with rest breaks & repositioning provided.   Therapy Documentation Precautions:  Precautions Precautions: Fall Precaution Comments: Dense Lt hemi, pusher Restrictions Weight Bearing Restrictions: No    Therapy/Group: Individual Therapy  [name] 08/12/2019, 2:11 PM

## 2019-08-12 NOTE — Progress Notes (Signed)
Occupational Therapy Session Note  Patient Details  Name: Kyle Garcia MRN: [id number] Date of Birth: 1951-10-26  Today's Date: 08/12/2019 OT Individual Time: 1445-1600 OT Individual Time Calculation (min): 75 min    Short Term Goals: Week 4:  OT Short Term Goal 1 (Week 4): STG= LTG d/t ELOS  Skilled Therapeutic Interventions/Progress Updates:    Family education session completed with pt's wife [name] and daughter [name]. Pt in TIS w/c, restless and intermittently c/o back pain, alleviated with further tipping of chair. Pt was brought down to therapy gym where demonstration was provided re navigating 1 step with TIS w/c. Pt's daughter, [name], returned demonstration with min cueing for technique. Handout provided to ensure carryover. Once back in room,  Demonstrated to family hoyer use sling positioning. [name] and [name] returned demonstration and positioned sling. Harrel Lemon was positioned and extensive discussion re safety with use. Pt was raised and transferred to the bed with hoyer lift. Family demonstrated proper positioning to ensure safety in bed. Handouts provided re LUE positioning and interventions for swelling of the limb. Pt was left supine with all needs met.   Therapy Documentation Precautions:  Precautions Precautions: Fall Precaution Comments: Dense Lt hemi, pusher Restrictions Weight Bearing Restrictions: No   Therapy/Group: Individual Therapy  [name] 08/12/2019, 7:23 AM

## 2019-08-12 NOTE — Plan of Care (Signed)
  Problem: RH BLADDER ELIMINATION Goal: RH STG MANAGE BLADDER WITH ASSISTANCE Description: STG Manage Bladder With MAX Assistance 08/12/2019 1803 by Dietrich Pates, RN Outcome: Progressing 08/12/2019 1803 by Dietrich Pates, RN Outcome: Progressing   Problem: RH SAFETY Goal: RH STG ADHERE TO SAFETY PRECAUTIONS W/ASSISTANCE/DEVICE Description: STG Adhere to Safety Precautions With MIN Assistance/Device. 08/12/2019 1803 by Dietrich Pates, RN Outcome: Progressing 08/12/2019 1803 by Dietrich Pates, RN Outcome: Progressing

## 2019-08-12 NOTE — Progress Notes (Signed)
Social Work Patient ID: Kyle Garcia, male   DOB: 1951/09/03, 68 y.o.   MRN: [id number]  Have reviewed team conference with wife and daughter (with pt present) and all agreeable with targeted d/c date of 8/21.  Wife confirms they have agreed with plan to d/c home with [facility]) following.  Family ed going well and wife confirms that she is also securing private duty caregiver via [employer]. I have spoken with Venia Carbon with [facility] this morning to alert to Friday d/c.  Reviewed and confirmed that she will order the hoyer lift and hospital bed for home and that their RN will be following. We have secured a loaner tilt-in-space from [employer] who plans to deliver to pt's home tomorrow afternoon.  Will plan for ambulance transfer home on Friday.  Continue to follow.  Ravon Mcilhenny, LCSW

## 2019-08-13 ENCOUNTER — Inpatient Hospital Stay (HOSPITAL_COMMUNITY): Payer: Medicare Other

## 2019-08-13 ENCOUNTER — Inpatient Hospital Stay (HOSPITAL_COMMUNITY): Payer: Medicare Other | Admitting: Speech Pathology

## 2019-08-13 ENCOUNTER — Inpatient Hospital Stay (HOSPITAL_COMMUNITY): Payer: Medicare Other | Admitting: Rehabilitation

## 2019-08-13 MED ORDER — ACETAMINOPHEN 325 MG PO TABS: 650.0000 mg | ORAL_TABLET | ORAL | Status: AC | PRN

## 2019-08-13 MED ORDER — BACLOFEN 5 MG PO TABS: 5.0000 mg | ORAL_TABLET | Freq: Three times a day (TID) | ORAL | 0 refills | Status: AC | PRN

## 2019-08-13 MED ORDER — TRAMADOL HCL 50 MG PO TABS: 50.0000 mg | ORAL_TABLET | Freq: Three times a day (TID) | ORAL | 0 refills | Status: AC | PRN

## 2019-08-13 MED ORDER — ROSUVASTATIN CALCIUM 20 MG PO TABS: 20.0000 mg | ORAL_TABLET | Freq: Every day | ORAL | 0 refills | Status: AC

## 2019-08-13 MED ORDER — CALCIUM CARBONATE-VITAMIN D 500-200 MG-UNIT PO TABS: 2.0000 | ORAL_TABLET | Freq: Two times a day (BID) | ORAL | 0 refills | Status: AC

## 2019-08-13 MED ORDER — RESOURCE THICKENUP CLEAR PO POWD: 1.0000 | ORAL | 1 refills | Status: AC | PRN

## 2019-08-13 MED ORDER — APIXABAN 5 MG PO TABS: 5.0000 mg | ORAL_TABLET | Freq: Two times a day (BID) | ORAL | 0 refills | Status: AC

## 2019-08-13 MED ORDER — ADULT MULTIVITAMIN W/MINERALS CH: 1.0000 | ORAL_TABLET | Freq: Every day | ORAL | Status: AC

## 2019-08-13 MED ORDER — FOLIC ACID 1 MG PO TABS: 1.0000 mg | ORAL_TABLET | Freq: Every day | ORAL | 4 refills | Status: AC

## 2019-08-13 NOTE — Progress Notes (Signed)
Physical Therapy Session Note  Patient Details  Name: Kyle Garcia MRN: [id number] Date of Birth: October 29, 1951  Today's Date: 08/13/2019 PT Individual Time: 1402-1430 PT Individual Time Calculation (min): 28 min   Short Term Goals: Week 1:  PT Short Term Goal 1 (Week 1): Pt will maintain sitting balance with CGA PT Short Term Goal 1 - Progress (Week 1): Not met PT Short Term Goal 2 (Week 1): Pt will perform bed<>chair transfer with max assist +1 PT Short Term Goal 2 - Progress (Week 1): Not met PT Short Term Goal 3 (Week 1): Pt will perform sit<>stand with max assist +2 PT Short Term Goal 3 - Progress (Week 1): Met Week 2:  PT Short Term Goal 1 (Week 2): Pt will perform bed mobility with max assist +1. PT Short Term Goal 1 - Progress (Week 2): Not met PT Short Term Goal 2 (Week 2): Pt will complete bed<>w/c with max assist +1. PT Short Term Goal 2 - Progress (Week 2): Not met PT Short Term Goal 3 (Week 2): Pt will maintain static sitting balance with mod assist +1. PT Short Term Goal 3 - Progress (Week 2): Progressing toward goal Week 3:  PT Short Term Goal 1 (Week 3): Pt will perform bed mobility with max assist +1. PT Short Term Goal 1 - Progress (Week 3): Not met PT Short Term Goal 2 (Week 3): Pt will complete bed<>w/c with max assist +1. PT Short Term Goal 2 - Progress (Week 3): Not met PT Short Term Goal 3 (Week 3): Pt will consistently demonstrate static sitting balance with mod assist +1. PT Short Term Goal 3 - Progress (Week 3): Not met  Skilled Therapeutic Interventions/Progress Updates:   Pt lying in bed, agreeable to PT working with pt to stretch LLE/UE.  Pt with intermittent tangible speech, but at other times does not during session.  PT worked on LLE during session with gentle rhythmic movement from flexion into extension and back x 20 reps with decreased overall tone noted.  Pt would intermittently have flexor withdrawal and pain reported in back.  PT would decrease ROM and  this seemed to improve.  Performed ER ROM x 20 reps followed by stretch with L knee in flexion x 3 reps of 20 secs.  Also performed L ankle DF stretch x 2 sets of 30 secs. PT then worked to perform ROM/stretching to LUE with elbow flex/ext x 20 reps to reduce tone and then into gentle elbow extension stretch x 3 reps of 20 secs.  Also performed gentle shoulder abd/add x 20 reps to reduce tone into abd stretch x 3 reps of 20 secs.  Attempted rolling for trunk activation, however unable to fully follow cues initially, but when lead with RUE to bed, he did attempt to assist.  Also somewhat assisted to the R once PT had began moving leg and trunk.  Pt did engage in appropriate conversation.  Was able to tell PT wife's name, that he was ready to get home to see her, he reports he has a daughter and son and named their names and that they had kids.  Pt left in bed with all needs in reach and bed alarm set.    Therapy Documentation Precautions:  Precautions Precautions: Fall Precaution Comments: Dense Lt hemi, pusher, visual spatial deficits Restrictions Weight Bearing Restrictions: No Pain: Pt with grimacing/groaning at varying points of stretching.  Reports back pain and continues to reach for genitals/bladder during session.  RN aware.  Therapy/Group: Individual Therapy  Denice Bors 08/13/2019, 2:49 PM

## 2019-08-13 NOTE — Progress Notes (Signed)
[place] PHYSICAL MEDICINE & REHABILITATION PROGRESS NOTE   Subjective/Complaints:  No new problems overnight.   ROS: Limited due to cognitive/behavioral    Objective:   No results found. No results for input(s): WBC, HGB, HCT, PLT in the last 72 hours. No results for input(s): NA, K, CL, CO2, GLUCOSE, BUN, CREATININE, CALCIUM in the last 72 hours.  Intake/Output Summary (Last 24 hours) at 08/13/2019 1103 Last data filed at 08/12/2019 1833 Gross per 24 hour  Intake 360 ml  Output -  Net 360 ml     Physical Exam: Vital Signs Blood pressure 125/71, pulse (!) 103, temperature 98 F (36.7 C), temperature source Oral, resp. rate 16, height 5\' 11"  (1.803 m), weight 59 kg, SpO2 100 %. Constitutional: No distress . Vital signs reviewed. HEENT: EOMI, oral membranes moist. Voice a little wet Neck: supple Cardiovascular: RRR without murmur. No JVD    Respiratory: CTA Bilaterally without wheezes or rales. Normal effort    GI: BS +, non-tender, non-distended  Musc: Pain with LUE/LLE PROM again Neurologic: less engaging Motor: RUE/RLE: Grossly 5/5  LUE/LLE: 0/5 Right gaze preference Left neglect Significant tone LUE/LLE-2-3/4 Psych:smiling , alert, distracte       Assessment/Plan: 1. Functional deficits secondary to Right MCA infarct with dysphagia  which require 3+ hours per day of interdisciplinary therapy in a comprehensive inpatient rehab setting.  Physiatrist is providing close team supervision and 24 hour management of active medical problems listed below.  Physiatrist and rehab team continue to assess barriers to discharge/monitor patient progress toward functional and medical goals  Care Tool:  Bathing    Body parts bathed by patient: Face, Right arm, Left arm, Abdomen, Chest, Front perineal area, Buttocks, Right upper leg, Left upper leg, Left lower leg, Right lower leg   Body parts bathed by helper: Right arm, Left arm, Chest, Abdomen     Bathing assist Assist  Level: Total Assistance - Patient < 25%     Upper Body Dressing/Undressing Upper body dressing   What is the patient wearing?: Pull over shirt    Upper body assist Assist Level: Total Assistance - Patient < 25%    Lower Body Dressing/Undressing Lower body dressing      What is the patient wearing?: Pants, Incontinence brief     Lower body assist Assist for lower body dressing: Dependent - Patient 0%     Toileting Toileting    Toileting assist Assist for toileting: Dependent - Patient 0%     Transfers Chair/bed transfer  Transfers assist     Chair/bed transfer assist level: 2 Helpers     Locomotion Ambulation   Ambulation assist   Ambulation activity did not occur: Safety/medical concerns  Assist level: 2 helpers Assistive device: No Device Max distance: 10   Walk 10 feet activity   Assist  Walk 10 feet activity did not occur: Safety/medical concerns  Assist level: 2 helpers Assistive device: No Device   Walk 50 feet activity   Assist Walk 50 feet with 2 turns activity did not occur: Safety/medical concerns         Walk 150 feet activity   Assist Walk 150 feet activity did not occur: Safety/medical concerns         Walk 10 feet on uneven surface  activity   Assist Walk 10 feet on uneven surfaces activity did not occur: Safety/medical concerns         Wheelchair     Assist Will patient use wheelchair at discharge?: Yes Type  of Wheelchair: Manual    Wheelchair assist level: Maximal Assistance - Patient 25 - 49% Max wheelchair distance: 20    Wheelchair 50 feet with 2 turns activity    Assist        Assist Level: Maximal Assistance - Patient 25 - 49%   Wheelchair 150 feet activity     Assist     Assist Level: Dependent - Patient 0%    Medical Problem List and Plan: 1.Dense lefthemiparesis, now with spasticitywith dysphagiasecondary to embolic shower right MCA occlusion status post reperfusion.  Infarct felt to be embolic secondary to hypercoagulable state from lung cancer    Continue CIR PT, OT, SLP 15/7 as tolerated  -home hospice this week, family ed prior---tentative target dc of 8/21  -no feeding tube or IVF   -appreciate palliative care help  -spoke again with wife today regarding his care and needs moving forward 2. Antithrombotics: -DVT/anticoagulation/history of right lower extremity DVT: Eliquis -antiplatelet therapy: N/A 3. Pain Management/spastic left hemiparesis:will reintroduce PRN tramadol for pain control as stressing comfort right now   -antispasmodics   -added back PRN baclofen for spasms   -Orthosis  -kpad for back 4. Mood:Provide emotional support -antipsychotic agents: N/A  -holding ritalin 5. Neuropsych: This patientis notcapable of making decisions on hisown behalf. 6. Skin/Wound Care:Routine skin checks 7. Fluids/Electrolytes/Nutrition: intake is 25-100% of meals   - BUN/Cr stable on 8/12--no more labs 8.Stage IV adenocarcinoma right lung. Chemotherapy as per [name]  -onc appts on temporary hold until after rehab discharge No plan for further chemo 9. Aspiration pneumonia,resolved 10.  Post stroke dysphagia.   D1 honeys  11. Hyperlipidemia. Crestor 12. Remote tobacco abuse. Counseling 13.Acute on chronic anemia.   Hemoglobin 8.2 on 8/12  Continue to monitor  14 Hypocalcemia-stable at 8.4 on 7/30  -likely related to #8 15.     Tachycardia: has had since admit--110's to 120's- Vitals:   08/12/19 1958 08/13/19 0316  BP: 108/66 125/71  Pulse: (!) 102 (!) 103  Resp: 16 16  Temp: 98.7 F (37.1 C) 98 F (36.7 C)  SpO2: 99% 100%     LOS: 34 days A FACE TO FACE EVALUATION WAS PERFORMED  [name] 08/13/2019, 11:03 AM

## 2019-08-13 NOTE — Progress Notes (Signed)
Speech Language Pathology Daily Session Note  Patient Details  Name: Deondra Wigger MRN: [id number] Date of Birth: 1951/03/07  Today's Date: 08/13/2019 SLP Individual Time: 2707-8675 SLP Individual Time Calculation (min): 55 min  Short Term Goals: Week 5: SLP Short Term Goal 1 (Week 5): Given Max A cues, pt will consume current diet with minimal overt s/s of aspiration. SLP Short Term Goal 2 (Week 5): Patient will demonstrate sustained attention to functional tasks for 5 minutes with Max A verbal cues for redirection. SLP Short Term Goal 3 (Week 5): Given Max A cues, pt will follow 1 step basic directions in 5 out of 10 opportunities. SLP Short Term Goal 4 (Week 5): Given Max A cues, patient will initiate functional tasks in 5 out of 10 opportunities.  Skilled Therapeutic Interventions: Skilled treatment session focused on cognitive goals and completion of family education with the patient's wife. SLP facilitated session by providing total A for basic problem solving and focused attention and Max A tactile cues for initiation during a basic calendar task. Patient restless in pain with intermittent grimacing and consistently grabbing for his genitals/bladder throughout session. Patient reported he had to pee but was unable to void. RN aware. Patient's wife present and all questions were answered in regards to patient's current cognitive functioning and overall safety at home. Patient left upright in bed with alarm on and wife present. Continue with current plan of care.      Pain Intermittent grimacing, patient unable to state pain site  Therapy/Group: Individual Therapy  Precilla Purnell 08/13/2019, 12:22 PM

## 2019-08-13 NOTE — Progress Notes (Signed)
Occupational Therapy Session Note  Patient Details  Name: Kyle Garcia MRN: [id number] Date of Birth: 1951-07-06  Today's Date: 08/13/2019 OT Individual Time: 5830-7460 OT Individual Time Calculation (min): 57 min    Short Term Goals: Week 1:  OT Short Term Goal 1 (Week 1): Pt will complete 1 grooming task EOB with 1 assist for sitting balance OT Short Term Goal 1 - Progress (Week 1): Progressing toward goal OT Short Term Goal 2 (Week 1): Pt will initiate washing affected arm with max vcs OT Short Term Goal 2 - Progress (Week 1): Progressing toward goal OT Short Term Goal 3 (Week 1): Pt will exhibit improved sustained attention by initiating placing 1 limb into UB garment OT Short Term Goal 3 - Progress (Week 1): Not met  Skilled Therapeutic Interventions/Progress Updates:    1:1. Family present for family education (wife). Reviewed PROM for LE/UE to tolerance to decrease risk of contracture/improved positioning. Wife able to don and doff resting hand splint with supervission. Handout provided for bed positioning using pillows supported upright sitting and sidelying. Wife verbalized understanding. Wife declines hoyer lift transfer and requests to review feeding. Wife able to provide small bites/sips and wait/cue for appropriate swallow. Pt consumes pureed textures and thickened liquids with VC for swallowing provided by wife. Wife able to pace meal well. All questions from wife answered. Exited session with pt seated in bed,e xit alarm on and call light in reach and all needs met  Therapy Documentation Precautions:  Precautions Precautions: Fall Precaution Comments: Dense Lt hemi, pusher, visual spatial deficits Restrictions Weight Bearing Restrictions: No General:   Vital Signs:  Pain:   ADL: ADL Eating: Not assessed Grooming: Dependent(washing hands) Where Assessed-Grooming: Bed level Upper Body Bathing: Dependent Where Assessed-Upper Body Bathing: Bed level Lower Body  Bathing: Dependent Where Assessed-Lower Body Bathing: Bed level Upper Body Dressing: Dependent Where Assessed-Upper Body Dressing: Bed level Lower Body Dressing: Dependent Where Assessed-Lower Body Dressing: Bed level Toileting: Dependent Where Assessed-Toileting: Bed level Toilet Transfer: Not assessed Tub/Shower Transfer: Not assessed Vision   Perception    Praxis   Exercises:   Other Treatments:     Therapy/Group: Individual Therapy  [name] 08/13/2019, 9:12 AM

## 2019-08-13 NOTE — Progress Notes (Signed)
Physical Therapy Session Note  Patient Details  Name: Kyle Garcia MRN: [id number] Date of Birth: 1951-09-18  Today's Date: 08/13/2019 PT Individual Time: 1115-1200 PT Individual Time Calculation (min): 45 min   Short Term Goals: Week 5:  PT Short Term Goal 1 (Week 5): STG = LTG due to estimated d/c date.  Skilled Therapeutic Interventions/Progress Updates:    Session focused on family education with pt's wife. Allowed wife to direct goals of session for what she felt like she needed in preparation for discharge. Her main goal this session was to be able to perform stretching/ROM and positioning in the bed and learning handling skills due to fear of hurting him. Focused on hands on education reviewing handouts provided from primary PT for BLE and BUE stretching and ROM. Educated on positioning, energy conservation for herself and patient, goals for OOB at home, and education on proper body mechanics. Pt reported needing to urinate, attempted use of urinal x 2 during session but unsuccessful. RN made aware.   Therapy Documentation Precautions:  Precautions Precautions: Fall Precaution Comments: Dense Lt hemi, pusher, visual spatial deficits Restrictions Weight Bearing Restrictions: No   Pain:   Moans and groans with spams and movement, repositioned as able.    Therapy/Group: Individual Therapy  Canary Brim Ivory Broad, PT, DPT, CBIS  08/13/2019, 12:53 PM

## 2019-08-14 NOTE — Progress Notes (Addendum)
Speech Language Pathology Discharge Summary  Patient Details  Name: Kyle Garcia MRN: [id number] Date of Birth: March 07, 1951  Patient has met 8 of 9 long term goals.  Patient to discharge at overall Total level.   Reasons goals not met: Patient had a decline in swallowing function   Clinical Impression/Discharge Summary: Although patient has met 8 of 9 LTGs this admission, patient is overall total A to complete functional and familiar tasks safely in regards to attention, following commands, initiation, problem solving and awareness. Patient is also consuming dys. 1 textures with honey-thick liquids via tsp with minimal overt s/s of aspiration and Max A verbal cues for use of swallowing compensatory strategies. Patient and family education is complete and patient will discharge home with hospice.   Care Partner:  Caregiver Able to Provide Assistance: Yes  Type of Caregiver Assistance: Physical;Cognitive  Recommendation:  24 hour supervision/assistance      Equipment: Thickener and suction   Reasons for discharge: Discharged from hospital   Patient/Family Agrees with Progress Made and Goals Achieved: Yes    [place], [place] 08/14/2019, 6:57 AM

## 2019-08-14 NOTE — Progress Notes (Signed)
Occupational Therapy Discharge Summary  Patient Details  Name: Kyle Garcia MRN: [id number] Date of Birth: 1951-10-16   Patient has met 7 of 9 long term goals due to improved attention and family education provided for total care to decrease burden of care.  Patient to discharge at overall Total Assist level.  Pt has made little progresss throughout stay d/t continued cognitive decline and pain impacting participation/tolerance of tx. Patient's care partner is independent to provide the necessary physical and cognitive assistance at discharge.  Family has participated in extensive education on hoyer transfers, bed level ADLs, PROM, pain management, feeding and positioning.  Reasons goals not met: PT continues to require MAX A for attention and visual deficits impacting participation as well as neuromotor/tone deficits.  Recommendation:  Patient will benefit from hospice services to improve remaining quality of life and pain management  Equipment: hoyer and hospital bed  Reasons for discharge: treatment goals met and discharge from hospital  Patient/family agrees with progress made and goals achieved: Yes  OT Discharge Precautions/Restrictions  Precautions Precautions: Fall Precaution Comments: Dense Lt hemi, pusher, visual spatial deficits Restrictions Weight Bearing Restrictions: No General   Vital Signs Therapy Vitals Pulse Rate: (!) 117 Resp: 18 BP: 112/77 Patient Position (if appropriate): Lying Oxygen Therapy SpO2: 100 % O2 Device: Room Air Pain Pain Assessment Pain Scale: Faces Faces Pain Scale: No hurt ADL ADL Eating: Not assessed Grooming: Maximal assistance Where Assessed-Grooming: Bed level Upper Body Bathing: Maximal assistance Where Assessed-Upper Body Bathing: Bed level Lower Body Bathing: Dependent Where Assessed-Lower Body Bathing: Bed level Upper Body Dressing: Dependent, Maximal assistance(total A) Where Assessed-Upper Body Dressing: Bed  level Lower Body Dressing: Dependent Where Assessed-Lower Body Dressing: Bed level Toileting: Dependent Where Assessed-Toileting: Bed level Toilet Transfer: Dependent Toilet Transfer Method: (hoyer) Tub/Shower Transfer: Not assessed Vision Baseline Vision/History: No visual deficits Additional Comments: L inattention and cognition impacting formal vision assessments, minimal visual fixation, decreased smooth pursuits in all directions, pt does not scan L of midline Perception  Perception: Impaired Inattention/Neglect: Does not attend to left side of body;Does not attend to left visual field Praxis Praxis: Impaired Praxis Impairment Details: Initiation;Motor planning;Perseveration;Ideation;Ideomotor Cognition Overall Cognitive Status: Impaired/Different from baseline Arousal/Alertness: Awake/alert Orientation Level: Oriented to person;Disoriented to place;Disoriented to time;Disoriented to situation Sustained Attention: Impaired Memory: Impaired Awareness: Impaired Awareness Impairment: Intellectual impairment Problem Solving: Impaired Safety/Judgment: Impaired Sensation Sensation Light Touch: Impaired by gross assessment Hot/Cold Impaired Details: Impaired LUE;Impaired LLE Proprioception: Impaired by gross assessment Proprioception Impaired Details: Impaired RUE Additional Comments: unable to formally assess secondary to cognitive deficits Coordination Gross Motor Movements are Fluid and Coordinated: No Fine Motor Movements are Fluid and Coordinated: No Coordination and Movement Description: L hemiparesis and increased tone limiting coordination Finger Nose Finger Test: Pt lacked the sustained attention needed to complete this assessment with Rt hand Motor  Motor Motor: Hemiplegia;Abnormal tone;Abnormal postural alignment and control Motor - Skilled Clinical Observations: L hemiparesis, hypertonia and decreased head/trunk control Motor - Discharge Observations: L  hemiparesis, hypertonia and decreased head/trunk control Mobility  Bed Mobility Rolling Right: Total Assistance - Patient < 25% Rolling Left: Maximal Assistance - Patient 25-49%  Trunk/Postural Assessment  Cervical Assessment Cervical Assessment: Exceptions to WFL(cervical flexion) Thoracic Assessment Thoracic Assessment: Exceptions to WFL(rounded shoulders) Lumbar Assessment Lumbar Assessment: Exceptions to WFL(post pelvic tilt) Postural Control Postural Control: Deficits on evaluation Head Control: impaired, inability to maintain cervical extension in sitting against gravity Trunk Control: impaired, unable to maintain midline orientation Righting Reactions: impaired Protective Responses: impaired  Balance  Static Sitting Balance Static Sitting - Level of Assistance: 2: Max assist;3: Mod assist Dynamic Sitting Balance Dynamic Sitting - Level of Assistance: 1: +1 Total assist;2: Max assist Sitting balance - Comments: pt pushing to the L with L lateral lean Extremity/Trunk Assessment RUE Assessment Active Range of Motion (AROM) Comments: WNL shoulder flexion, limited shoulder abduction LUE Assessment LUE Assessment: Exceptions to [facility] General Strength Comments: Hypertonic flexor synergy   [name] 08/14/2019, 12:17 PM

## 2019-08-14 NOTE — Progress Notes (Signed)
Patient to be discharged to home via EMS. Patients's spouse received discharge instructions from Marlowe Shores, PA-C with verbal understanding. Patient belongings went home with spouse 08/13/2019.

## 2019-08-14 NOTE — Consult Note (Signed)
   [facility] CM Inpatient Consult   08/14/2019  Kyle Garcia 1951-02-02 [id number]  Patient screened for potential [facility] services are needed.  Chart briefly reviewed and notes from Hospice consult reviewed.   Review of patient's medical record reveals patient is for home with Hospice Care in the Medicare ACO.  Patient will receive full care management under his Hospice benefits. Plan: Sign off.    Please place a [facility] Care Management consult as appropriate and for questions contact:   Natividad Brood, RN BSN [facility] Liaison  [phone] business mobile phone Toll free office [phone]  Fax number: [phone] [email or web address]

## 2019-08-14 NOTE — Progress Notes (Addendum)
Manufacturing [employer] [facility]) Hospice  Continue to follow for hospice admission later today. Admission nurse is scheduled to see Mr. Hyder at home today between 5-8pm. Hospital bed, OBT and hoyer lift ordered yesterday through [employer] and scheduled to be delivered to home this morning. Spouse [name] has spoken with [employer]. Working with [name] to coordinate services.   Addendum: Home suction ordered to be delivered with other DME this morning. Thank you [name] and Loma Sousa for helping get Mr. Godsil's care arranged.    Thank you,  Erling Conte, LCSW [phone]

## 2019-08-14 NOTE — Progress Notes (Signed)
Social Work Discharge Note   The overall goal for the admission was met for:   Discharge location: Yes - home with wife and family providing 24/7 assistance  Length of Stay: Yes  Discharge activity level: No - original goals not met due to limited progress and focus moved to family ed  Home/community participation: No  Services provided included: MD, RD, PT, OT, SLP, RN, TR, Pharmacy and SW  Financial Services: Medicare  Follow-up services arranged: Home Health: RN via [facility] (Hospice), DME: hospital bed, hoyer lift and suction machine via [facility] and Other: tilt-in-space w/c and cushion via [facility] (or additional information):      Contact info:  Wife, [name] @ [phone]  Patient/Family verbalized understanding of follow-up arrangements: Yes  Individual responsible for coordination of the follow-up plan: wife  Confirmed correct DME delivered: Lennart Pall 08/14/2019    Eriona Kinchen, [name]

## 2019-08-14 NOTE — Progress Notes (Signed)
[place] PHYSICAL MEDICINE & REHABILITATION PROGRESS NOTE   Subjective/Complaints:  No new issues. Thankful for our help on rehab. Seemed to be aware that he was leaving today  ROS: Limited due to cognitive/behavioral   Objective:   No results found. No results for input(s): WBC, HGB, HCT, PLT in the last 72 hours. No results for input(s): NA, K, CL, CO2, GLUCOSE, BUN, CREATININE, CALCIUM in the last 72 hours.  Intake/Output Summary (Last 24 hours) at 08/14/2019 1051 Last data filed at 08/14/2019 0906 Gross per 24 hour  Intake 480 ml  Output -  Net 480 ml     Physical Exam: Vital Signs Blood pressure (!) 119/94, pulse (!) 121, temperature 98.4 F (36.9 C), temperature source Oral, resp. rate 18, height 5\' 11"  (1.803 m), weight 59 kg, SpO2 99 %. Constitutional: No distress . Vital signs reviewed. HEENT: EOMI, oral membranes moist Neck: supple Cardiovascular: RRR without murmur. No JVD    Respiratory: CTA Bilaterally without wheezes or rales. Normal effort    GI: BS +, non-tender, non-distended  Musc: Pain with LUE/LLE PROM again Neurologic: less engaging Motor: RUE/RLE: Grossly 5/5  LUE/LLE: 0/5 Right gaze preference Left neglect Significant tone LUE/LLE-2-3/4 Psych:alert       Assessment/Plan: 1. Functional deficits secondary to Right MCA infarct with dysphagia  which require 3+ hours per day of interdisciplinary therapy in a comprehensive inpatient rehab setting.  Physiatrist is providing close team supervision and 24 hour management of active medical problems listed below.  Physiatrist and rehab team continue to assess barriers to discharge/monitor patient progress toward functional and medical goals  Care Tool:  Bathing    Body parts bathed by patient: Face, Right arm, Left arm, Abdomen, Chest, Front perineal area, Buttocks, Right upper leg, Left upper leg, Left lower leg, Right lower leg   Body parts bathed by helper: Right arm, Left arm, Chest, Abdomen      Bathing assist Assist Level: Total Assistance - Patient < 25%     Upper Body Dressing/Undressing Upper body dressing   What is the patient wearing?: Pull over shirt    Upper body assist Assist Level: Total Assistance - Patient < 25%    Lower Body Dressing/Undressing Lower body dressing      What is the patient wearing?: Pants, Incontinence brief     Lower body assist Assist for lower body dressing: Dependent - Patient 0%     Toileting Toileting    Toileting assist Assist for toileting: Dependent - Patient 0%     Transfers Chair/bed transfer  Transfers assist     Chair/bed transfer assist level: 2 Helpers     Locomotion Ambulation   Ambulation assist   Ambulation activity did not occur: Safety/medical concerns  Assist level: 2 helpers Assistive device: No Device Max distance: 10   Walk 10 feet activity   Assist  Walk 10 feet activity did not occur: Safety/medical concerns  Assist level: 2 helpers Assistive device: No Device   Walk 50 feet activity   Assist Walk 50 feet with 2 turns activity did not occur: Safety/medical concerns         Walk 150 feet activity   Assist Walk 150 feet activity did not occur: Safety/medical concerns         Walk 10 feet on uneven surface  activity   Assist Walk 10 feet on uneven surfaces activity did not occur: Safety/medical concerns         Wheelchair     Assist Will patient  use wheelchair at discharge?: Yes Type of Wheelchair: Manual    Wheelchair assist level: Maximal Assistance - Patient 25 - 49% Max wheelchair distance: 20    Wheelchair 50 feet with 2 turns activity    Assist        Assist Level: Maximal Assistance - Patient 25 - 49%   Wheelchair 150 feet activity     Assist     Assist Level: Dependent - Patient 0%    Medical Problem List and Plan: 1.Dense lefthemiparesis, now with spasticitywith dysphagiasecondary to embolic shower right MCA occlusion  status post reperfusion. Infarct felt to be embolic secondary to hypercoagulable state from lung cancer     -dc home with home hospice today  -will not have [facility] to [facility]  -hospice/palliative care to follow after discharge  -no feeding tube or IVF   -appreciate palliative care help  -spoke again with wife today regarding his care and needs moving forward 2. Antithrombotics: -DVT/anticoagulation/history of right lower extremity DVT: Eliquis -antiplatelet therapy: N/A 3. Pain Management/spastic left hemiparesis:will reintroduce PRN tramadol for pain control as stressing comfort right now   -antispasmodics   - PRN baclofen for spasms   -Orthosis  -kpad for back 4. Mood:Provide emotional support -antipsychotic agents: N/A  -holding ritalin 5. Neuropsych: This patientis notcapable of making decisions on hisown behalf. 6. Skin/Wound Care:Routine skin checks 7. Fluids/Electrolytes/Nutrition: intake is 25-100% of meals   - BUN/Cr stable on 8/12--no more labs 8.Stage IV adenocarcinoma right lung. Chemotherapy as per [name]  -onc appts on temporary hold until after rehab discharge No plan for further chemo 9. Aspiration pneumonia,resolved 10.  Post stroke dysphagia.   D1 honeys  11. Hyperlipidemia. Crestor 12. Remote tobacco abuse. Counseling 13.Acute on chronic anemia.   Hemoglobin 8.2 on 8/12  Continue to monitor  14 Hypocalcemia-stable at 8.4 on 7/30  -likely related to #8 15.     Tachycardia: has had since admit--110's to 120's- Vitals:   08/13/19 1439 08/14/19 0646  BP: 106/75 (!) 119/94  Pulse: (!) 121 (!) 121  Resp:  18  Temp:  98.4 F (36.9 C)  SpO2: 99% 99%     LOS: 35 days A FACE TO FACE EVALUATION WAS PERFORMED  [name] 08/14/2019, 10:51 AM

## 2019-08-17 ENCOUNTER — Telehealth: Payer: Self-pay | Admitting: Medical Oncology

## 2019-08-17 NOTE — Telephone Encounter (Signed)
Pt admitted to hospice / Wife asking to continue Eloquis due to his blood clot.  Cancelled future appts.

## 2019-08-19 ENCOUNTER — Inpatient Hospital Stay: Payer: Medicare Other | Admitting: Internal Medicine

## 2019-08-19 ENCOUNTER — Inpatient Hospital Stay: Payer: Medicare Other

## 2019-08-25 DEATH — deceased

## 2019-10-06 IMAGING — CT CT ANGIOGRAPHY CHEST
2 of 6 series · 18 of 46 positions shown · IV contrast (OMNIPAQUE)
Comparison: PET CT May 06, 2019; CT angiogram chest April 14, 2019

CLINICAL DATA: Hemoptysis.  Lung carcinoma.

EXAM:
CT ANGIOGRAPHY CHEST WITH CONTRAST
TECHNIQUE: Multidetector CT imaging of the chest was performed using the
standard protocol during bolus administration of intravenous
contrast. Multiplanar CT image reconstructions and MIPs were
obtained to evaluate the vascular anatomy.
CONTRAST:  100mL OMNIPAQUE IOHEXOL 350 MG/ML SOLN

[Series 5: thins · axial · 0.77mm/px · z∈[-369,-49]mm · 15 of 352 slices shown]
[im 16/352  lung]
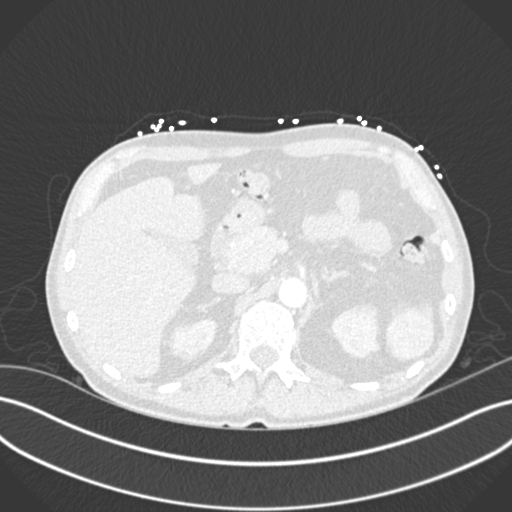
[im 46/352  soft-tissue]
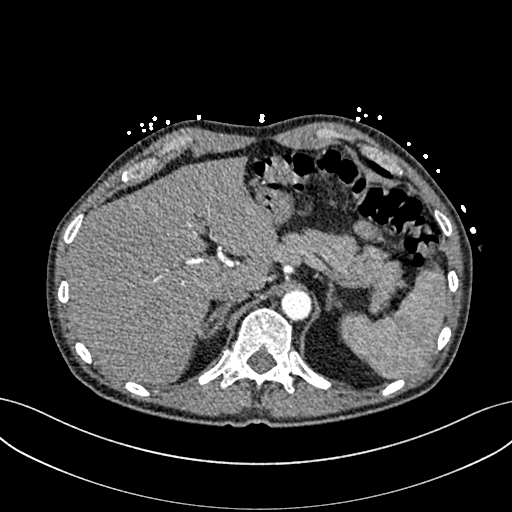
[im 62/352  lung]
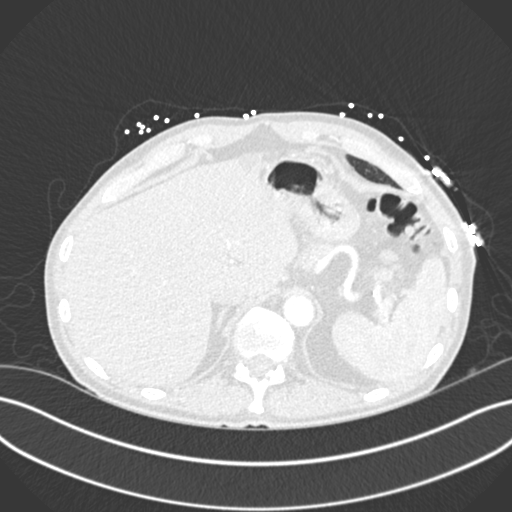
[im 92/352  soft-tissue]
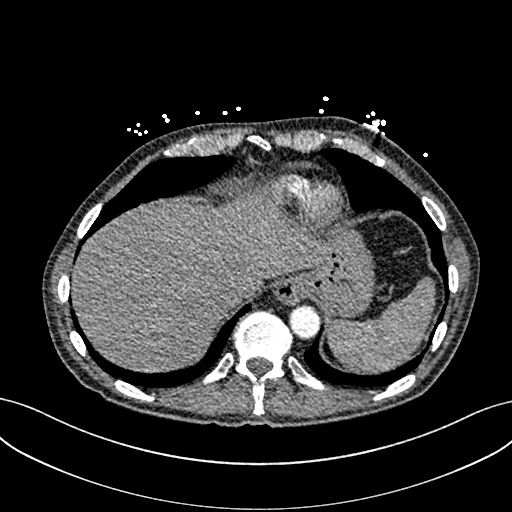
[im 107/352  lung]
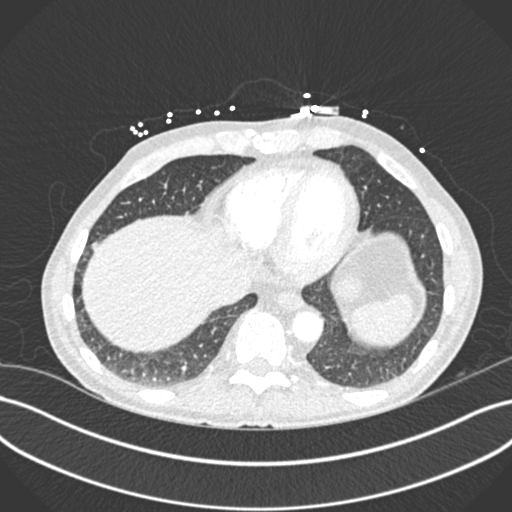
[im 138/352  soft-tissue]
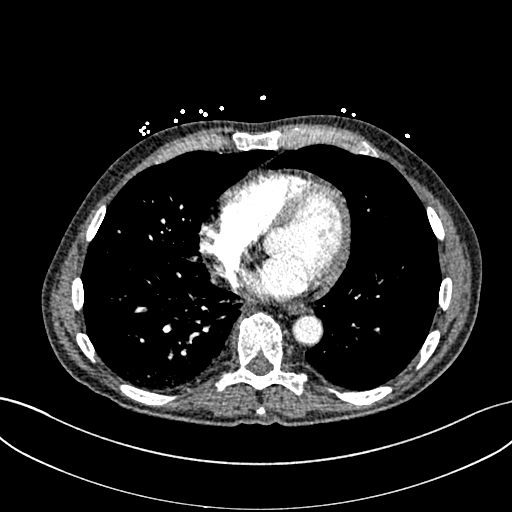
[im 153/352  lung]
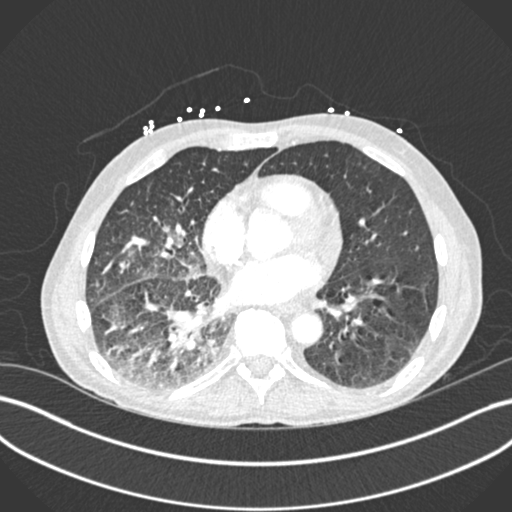
[im 184/352  soft-tissue]
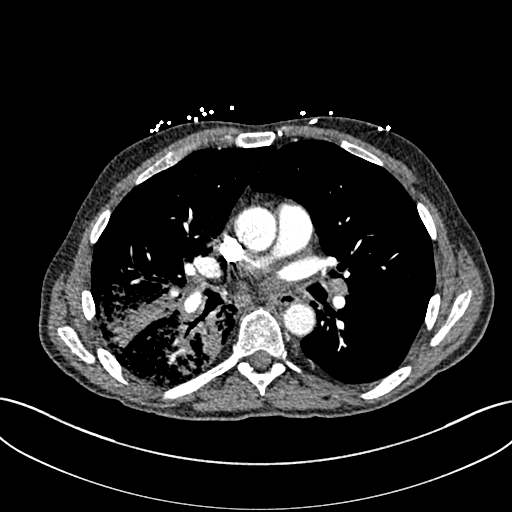
[im 199/352  lung]
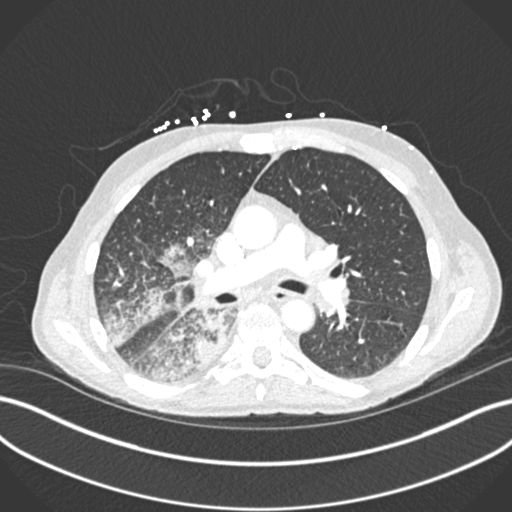
[im 214/352  soft-tissue]
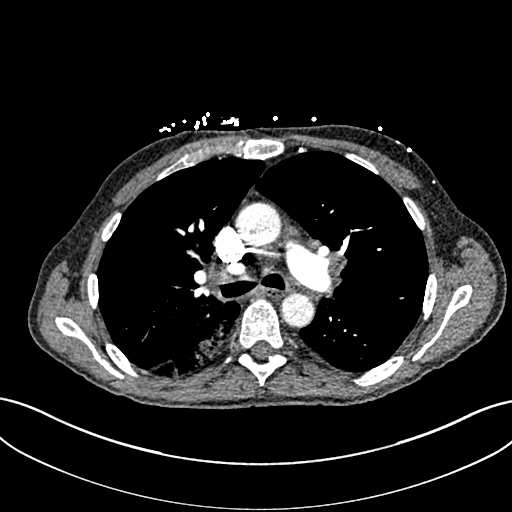
[im 245/352  lung]
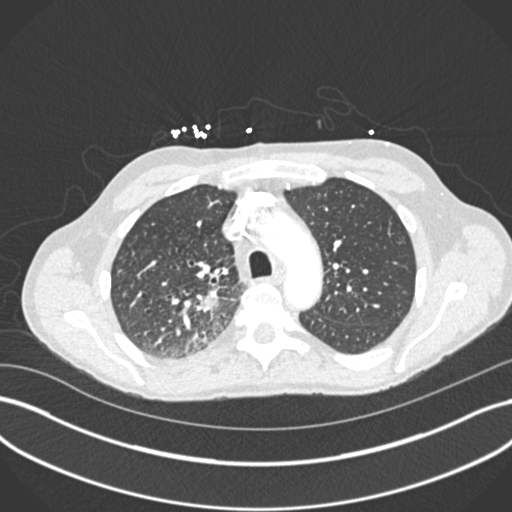
[im 260/352  soft-tissue]
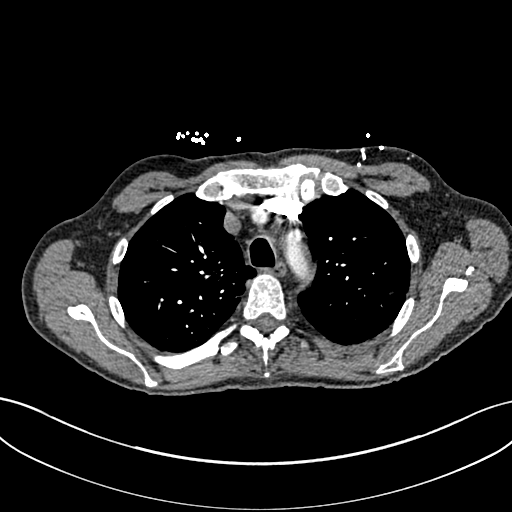
[im 290/352  lung]
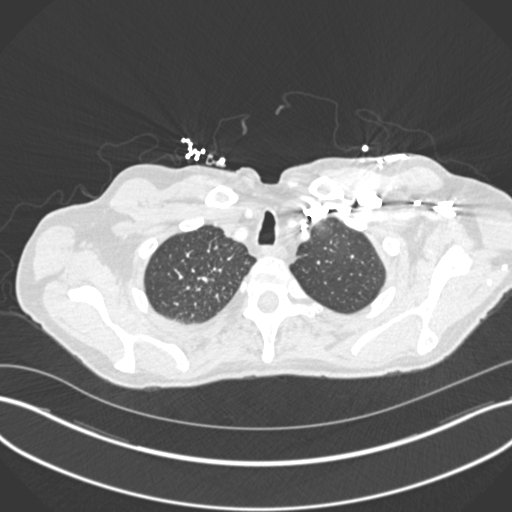
[im 306/352  soft-tissue]
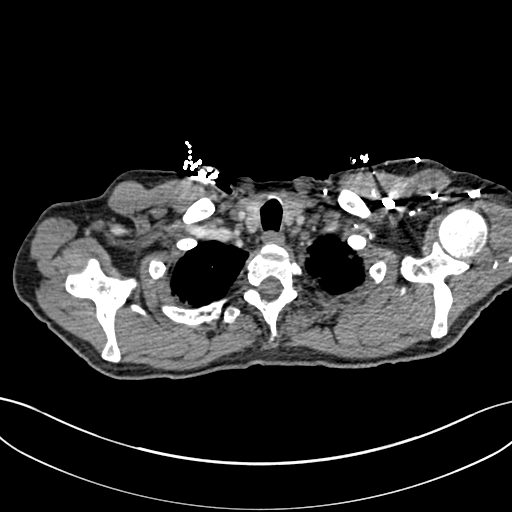
[im 336/352  lung]
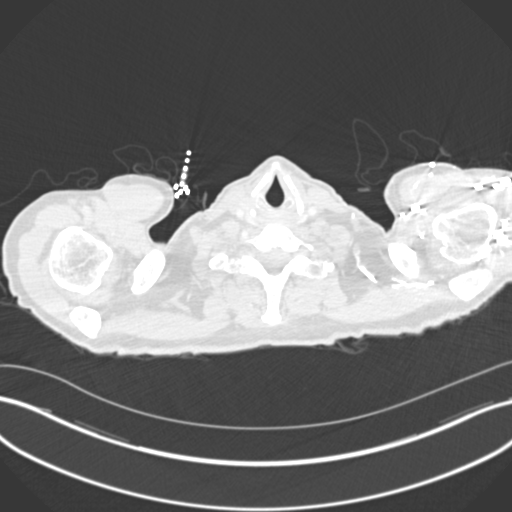

[Series 7: coronal mpr · coronal · 0.72mm/px · 3 of 149 slices shown]
[im 38/149  soft-tissue]
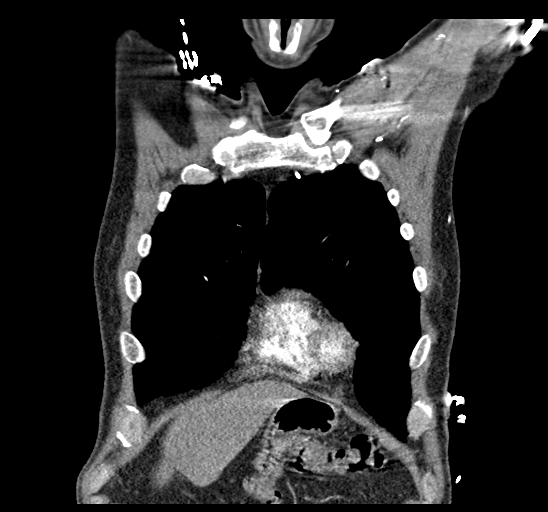
[im 75/149  soft-tissue]
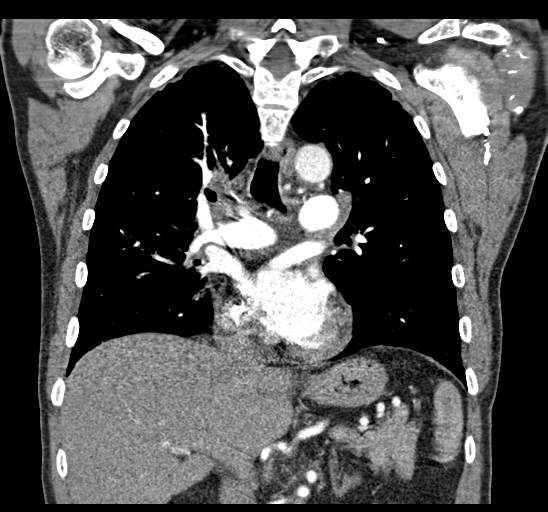
[im 112/149  soft-tissue]
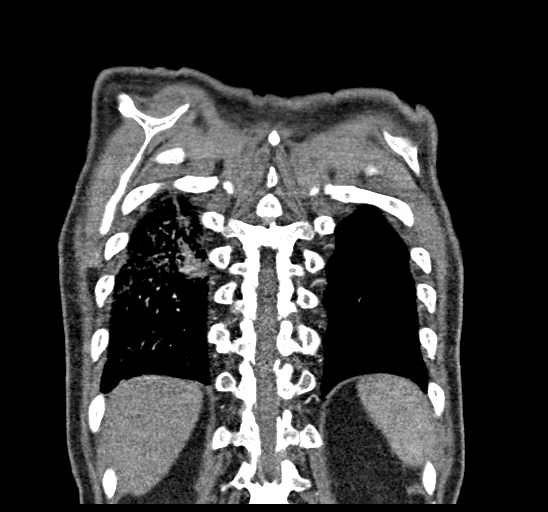

[18 of 46 positions shown; findings below may reference images not displayed]

FINDINGS: Cardiovascular: There is no demonstrable pulmonary embolus. There is
no thoracic aortic aneurysm or dissection. The visualized great
vessels appear normal. There is no pericardial thickening. There is
a minimal pericardial effusion with fluid in the pericardium likely
within physiologic range.

Mediastinum/Nodes: Thyroid appears normal. There is a left hilar
lymph node measuring 1.7 x 1.4 cm, slightly larger than on prior CT
angiogram chest from 2 months prior there are multiple subcentimeter
lymph nodes in the thoracic region which appear essentially stable.
Subcentimeter lymph nodes in the left supraclavicular region also
appear stable. No esophageal lesions are appreciable.

Lungs/Pleura: There is extensive airspace consolidation throughout
much of the right lower lobe consistent with pneumonia. There is
consolidation in the periphery of the lateral segment right middle
lobe. There are areas of patchy airspace disease in each upper lobe,
more on the right than on the left. Previous masslike area in the
posterior segment of the right upper lobe is less well delineated at
this time, currently measuring 9 x 7 mm. This now ill-defined
opacity is best seen on axial slice 62 series 6. There is
bronchiectatic change in the right upper lobe, essentially stable.
There is subtle patchy infiltrate at several areas in the left lower
lobe. There is no appreciable pleural effusion.

Upper Abdomen: There is a cyst in the left lobe of the liver
anteriorly near the dome measuring 1.6 x 1.3 cm, stable. A 5 mm
probable cyst is noted nearby. Visualized upper abdominal structures
otherwise appear unremarkable.

Musculoskeletal: There are no blastic or lytic bone lesions. No
chest wall lesions evident.

Review of the MIP images confirms the above findings.
IMPRESSION: 1. No demonstrable pulmonary embolus. No thoracic aortic aneurysm or
dissection.

2. Multifocal airspace opacity consistent with, most pronounced in
the right lower lobe and a portion of the lateral segment right
middle lobe.

3. In comparison with prior PET study, right upper lobe nodular
lesion is smaller and less well-defined.

4. Slight increase in size of left hilar lymph node, known by PET to
have increased metabolic activity. Neoplastic involvement of this
lymph node is felt to be present.

5.  Stable bronchiectatic change in the right upper lobe.

## 2019-11-10 IMAGING — DX CHEST - 2 VIEW
2 series · 3 of 3 positions shown · non-contrast
Comparison: July 09, 2019

CLINICAL DATA: Cough and confusion.

EXAM:
CHEST - 2 VIEW

[Series 2: chest lat · 0.14mm/px · 2 of 2 slices shown]
[im 1/2]
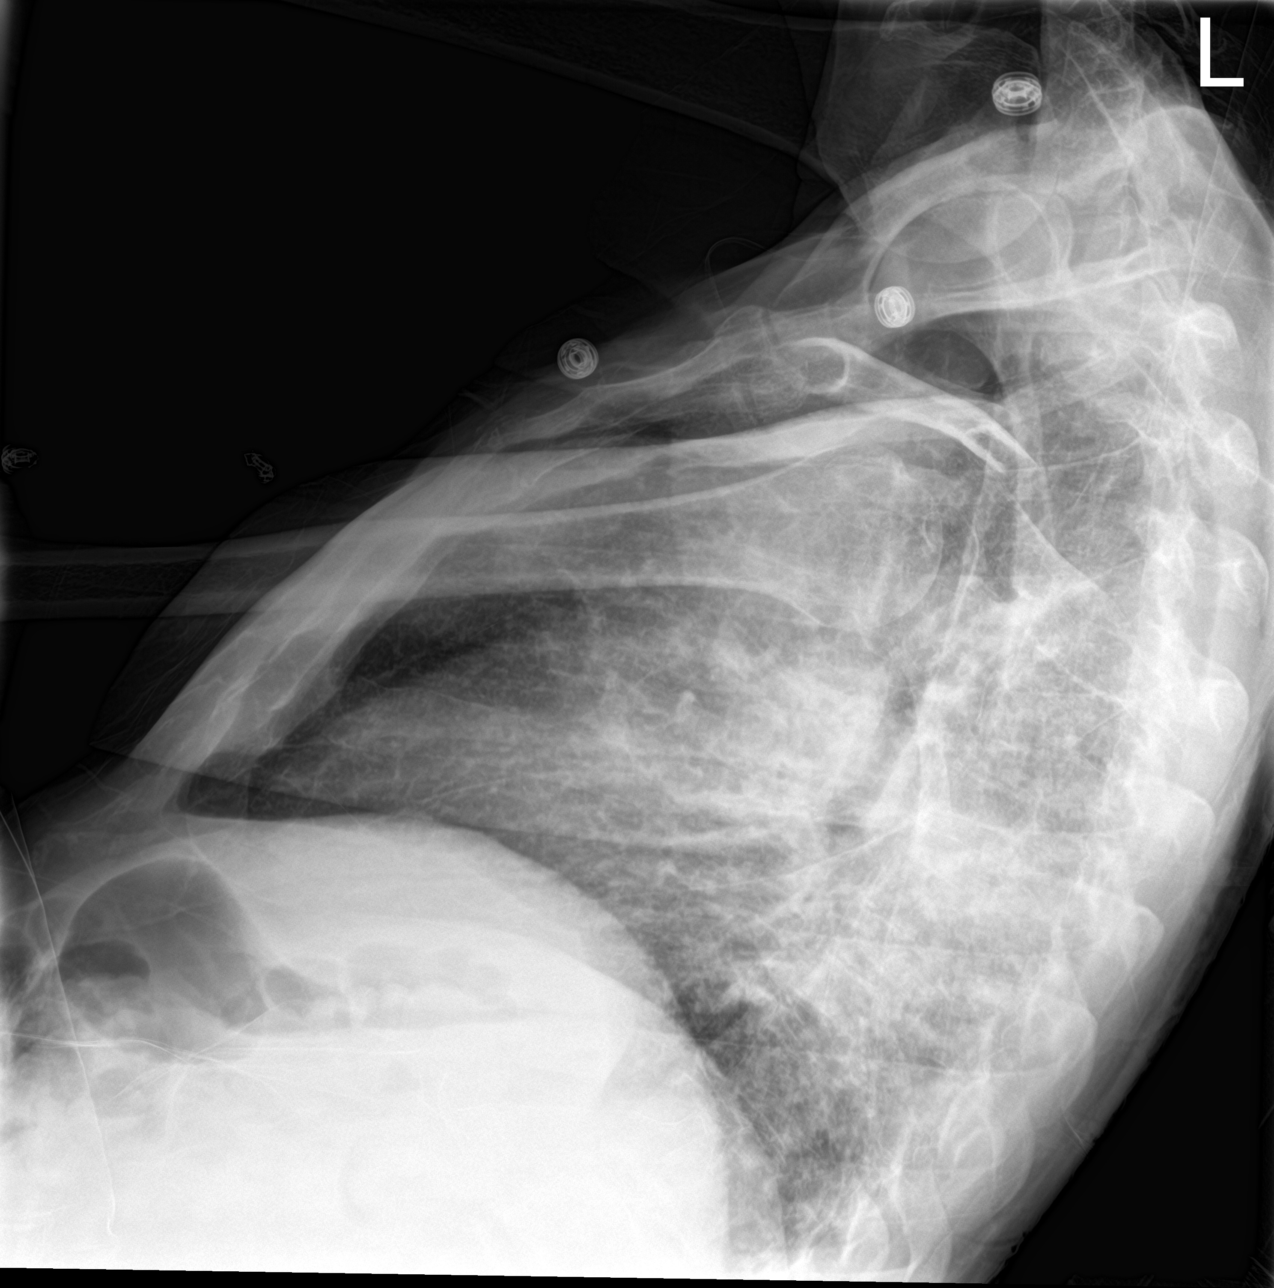
[im 2/2]
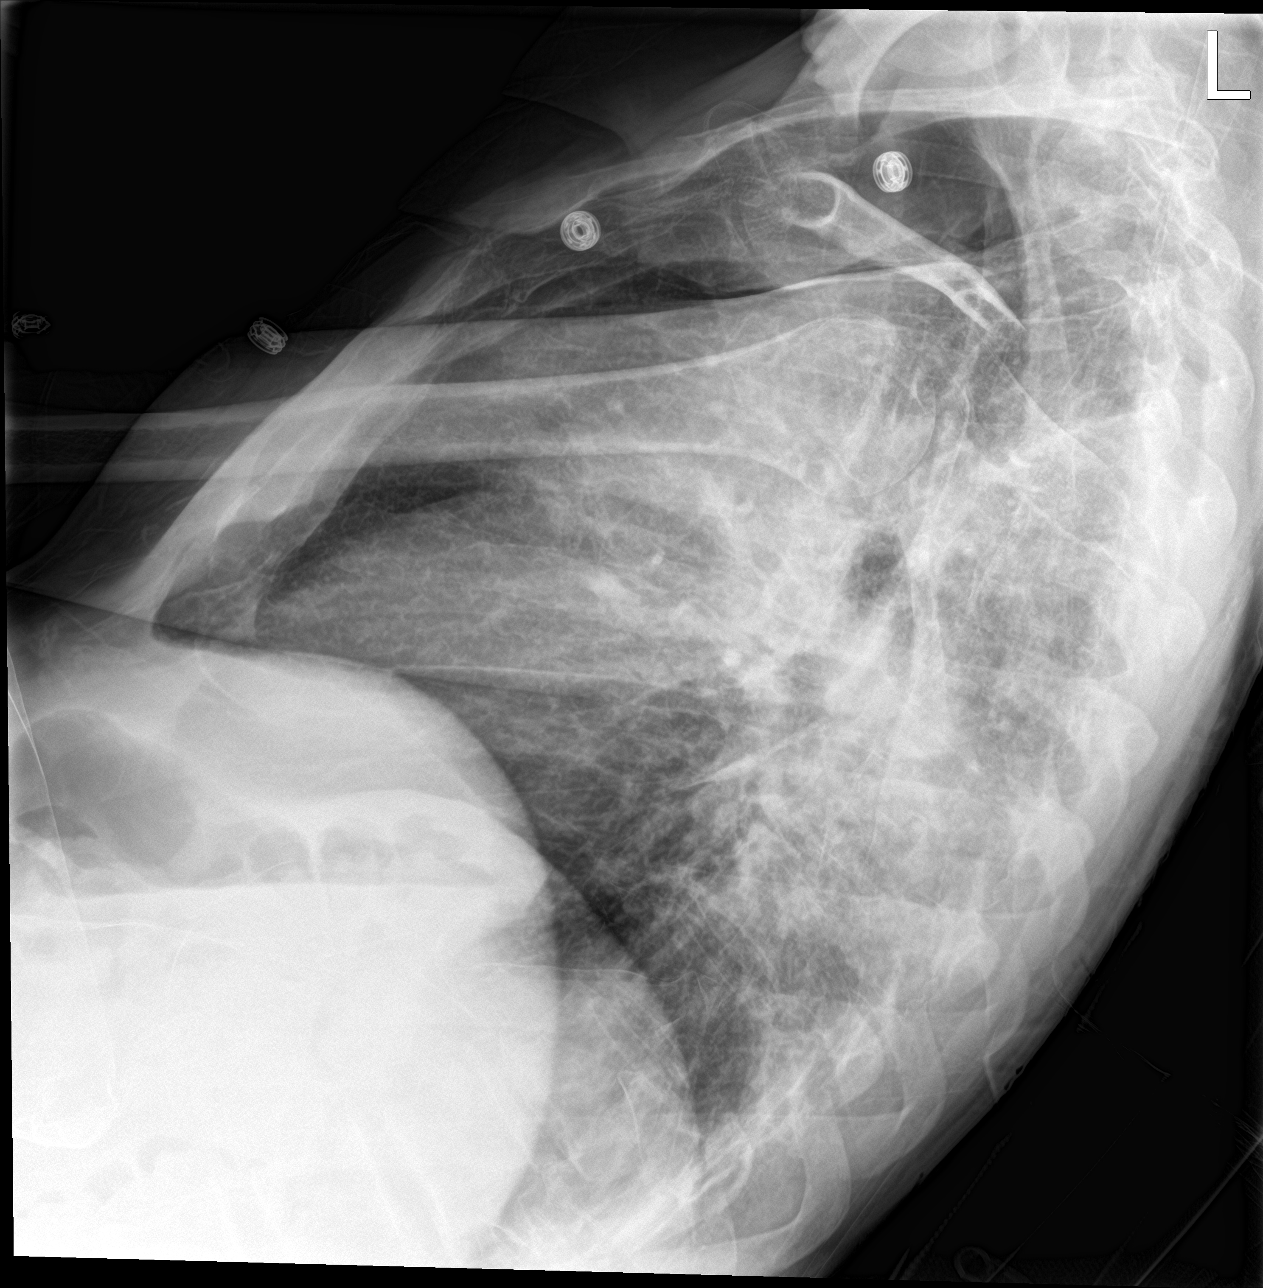

[chest ap]
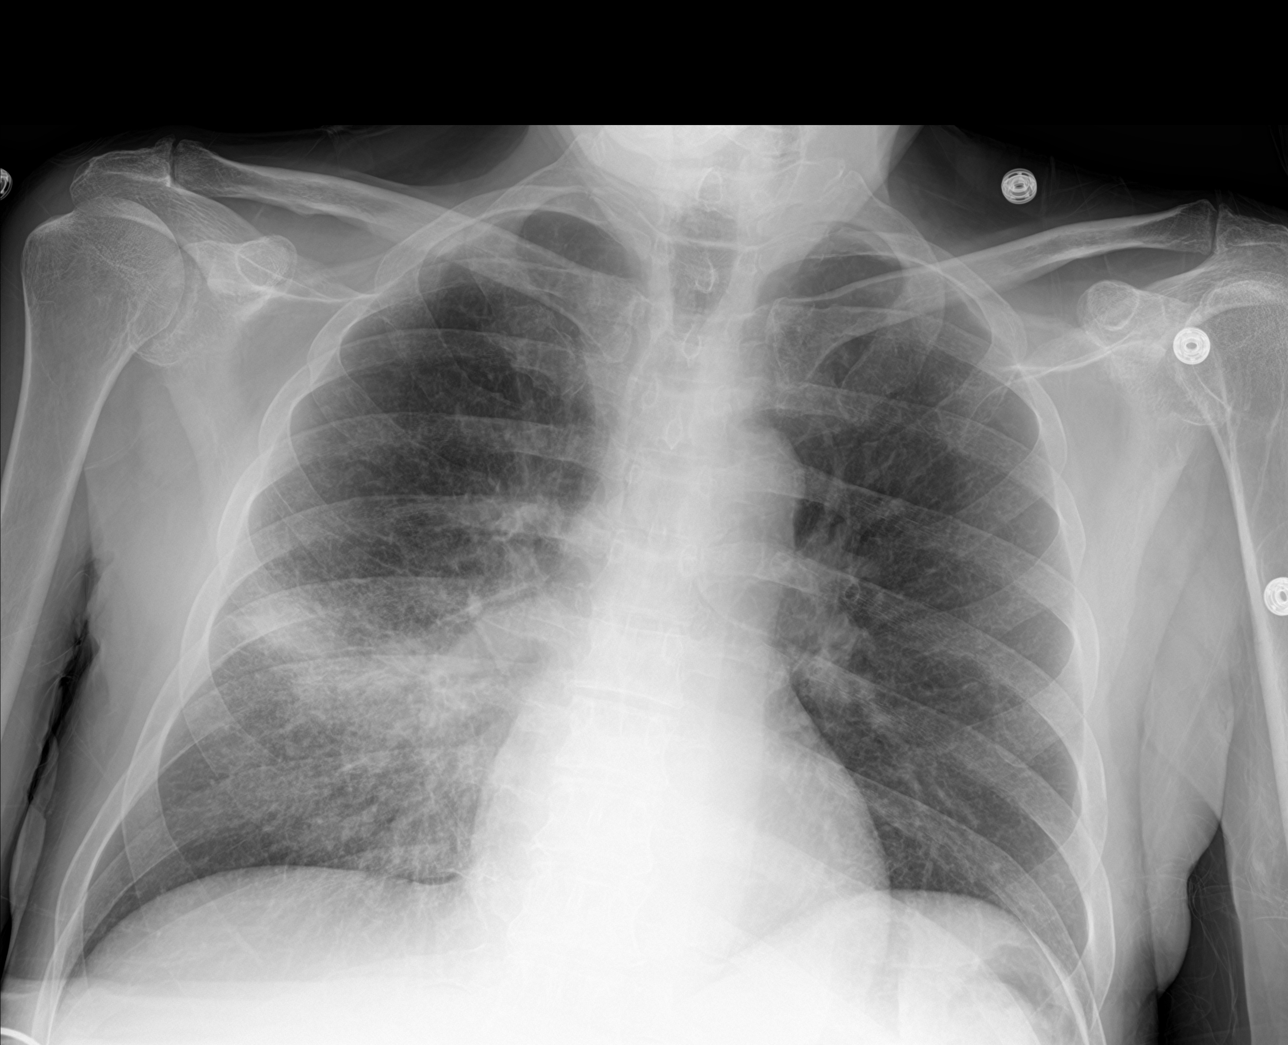

[3 of 3 positions shown; findings below may reference images not displayed]

FINDINGS: The mediastinal contour and cardiac silhouette are normal. Right mid
and lower lung pneumonia is unchanged. The left lung is clear. There
is no pleural effusion. The bony structures are stable.
IMPRESSION: Right mid and lower lung pneumonia is unchanged.
